# Patient Record
Sex: Female | Born: 1955
Health system: Southern US, Community
[De-identification: ages and names within clinical notes are randomized; demographics above are authoritative.]

## PROBLEM LIST (undated history)

## (undated) DIAGNOSIS — G4730 Sleep apnea, unspecified: Secondary | ICD-10-CM

## (undated) DIAGNOSIS — I10 Essential (primary) hypertension: Secondary | ICD-10-CM

## (undated) DIAGNOSIS — J45909 Unspecified asthma, uncomplicated: Secondary | ICD-10-CM

## (undated) DIAGNOSIS — J449 Chronic obstructive pulmonary disease, unspecified: Secondary | ICD-10-CM

## (undated) DIAGNOSIS — R569 Unspecified convulsions: Secondary | ICD-10-CM

## (undated) DIAGNOSIS — J189 Pneumonia, unspecified organism: Secondary | ICD-10-CM

## (undated) DIAGNOSIS — M719 Bursopathy, unspecified: Secondary | ICD-10-CM

## (undated) DIAGNOSIS — M7072 Other bursitis of hip, left hip: Secondary | ICD-10-CM

## (undated) DIAGNOSIS — K219 Gastro-esophageal reflux disease without esophagitis: Secondary | ICD-10-CM

## (undated) DIAGNOSIS — E785 Hyperlipidemia, unspecified: Secondary | ICD-10-CM

## (undated) DIAGNOSIS — R51 Headache: Secondary | ICD-10-CM

## (undated) HISTORY — DX: Sleep apnea, unspecified: G47.30

## (undated) HISTORY — DX: Unspecified asthma, uncomplicated: J45.909

## (undated) HISTORY — PX: CATARACT EXTRACTION: SUR2

## (undated) HISTORY — DX: Essential (primary) hypertension: I10

## (undated) HISTORY — DX: Bursopathy, unspecified: M71.9

## (undated) HISTORY — DX: Unspecified convulsions: R56.9

## (undated) HISTORY — PX: APPENDECTOMY: SHX54

## (undated) HISTORY — PX: OTHER SURGICAL HISTORY: SHX169

## (undated) HISTORY — PX: CHOLECYSTECTOMY: SHX55

## (undated) HISTORY — PX: ABDOMINAL HYSTERECTOMY: SHX81

---

## 2000-08-07 ENCOUNTER — Other Ambulatory Visit: Admission: RE | Admit: 2000-08-07 | Discharge: 2000-08-07 | Payer: Self-pay | Admitting: Obstetrics and Gynecology

## 2003-12-27 ENCOUNTER — Ambulatory Visit (HOSPITAL_COMMUNITY): Admission: RE | Admit: 2003-12-27 | Discharge: 2003-12-27 | Payer: Self-pay | Admitting: General Surgery

## 2004-01-11 ENCOUNTER — Ambulatory Visit (HOSPITAL_COMMUNITY): Admission: RE | Admit: 2004-01-11 | Discharge: 2004-01-11 | Payer: Self-pay | Admitting: General Surgery

## 2012-06-12 ENCOUNTER — Ambulatory Visit (INDEPENDENT_AMBULATORY_CARE_PROVIDER_SITE_OTHER): Payer: BC Managed Care – PPO | Admitting: Nurse Practitioner

## 2012-06-12 ENCOUNTER — Ambulatory Visit (INDEPENDENT_AMBULATORY_CARE_PROVIDER_SITE_OTHER): Payer: BC Managed Care – PPO

## 2012-06-12 VITALS — BP 143/84 | HR 65 | Temp 98.2°F | Ht 66.0 in | Wt 191.0 lb

## 2012-06-12 DIAGNOSIS — R059 Cough, unspecified: Secondary | ICD-10-CM

## 2012-06-12 DIAGNOSIS — R05 Cough: Secondary | ICD-10-CM

## 2012-06-12 DIAGNOSIS — J309 Allergic rhinitis, unspecified: Secondary | ICD-10-CM

## 2012-06-12 DIAGNOSIS — J329 Chronic sinusitis, unspecified: Secondary | ICD-10-CM

## 2012-06-12 MED ORDER — CETIRIZINE HCL 10 MG PO TABS: 10.0000 mg | ORAL_TABLET | Freq: Every day | ORAL | Status: DC

## 2012-06-12 MED ORDER — HYDROCODONE-HOMATROPINE 5-1.5 MG/5ML PO SYRP: 5.0000 mL | ORAL_SOLUTION | Freq: Three times a day (TID) | ORAL | Status: DC | PRN

## 2012-06-12 MED ORDER — AZITHROMYCIN 250 MG PO TABS: ORAL_TABLET | ORAL | Status: DC

## 2012-06-12 NOTE — Progress Notes (Signed)
  Subjective:    Patient ID: Courtney Mcconnell, female    DOB: 08/18/1955, 57 y.o.   MRN: [id number]  HPI- Patient in C/O cough and fatigue. Trouble sleeping because sh eis coughing so much. Says that she has greenish congestion. Has tried alka-seltzer plus cold and flu OTC.  Review of Systems  Constitutional: Positive for fever (resolved yesterday) and chills.  HENT: Positive for ear pain, congestion, rhinorrhea, postnasal drip and sinus pressure.   Eyes: Negative.   Respiratory: Positive for cough.   Cardiovascular: Negative.   Gastrointestinal: Negative.   Psychiatric/Behavioral: Negative.        Objective:   Physical Exam  Constitutional: She appears well-developed and well-nourished.  HENT:  Right Ear: Hearing, tympanic membrane, external ear and ear canal normal.  Left Ear: Hearing, tympanic membrane, external ear and ear canal normal.  Nose: Mucosal edema and rhinorrhea present. Right sinus exhibits no maxillary sinus tenderness and no frontal sinus tenderness. Left sinus exhibits no maxillary sinus tenderness and no frontal sinus tenderness.  Mouth/Throat: Posterior oropharyngeal erythema present.  Eyes: Conjunctivae are normal. Pupils are equal, round, and reactive to light.  Neck: Normal range of motion. Neck supple.  Cardiovascular: Normal rate, regular rhythm and normal heart sounds.   Pulmonary/Chest: Effort normal and breath sounds normal. She has no wheezes. She has no rales.  Deep dry cough  Abdominal: Soft. Bowel sounds are normal.  Lymphadenopathy:    She has no cervical adenopathy.  Neurological: She is alert.  Skin: Skin is warm.  Psychiatric: She has a normal mood and affect. Her behavior is normal. Judgment and thought content normal.   BP 143/84  Pulse 65  Temp(Src) 98.2 F (36.8 C) (Oral)  Ht 5\' 6"  (1.676 m)  Wt 191 lb (86.637 kg)  BMI 30.84 kg/m2  Chest xray- Bronchitic changes-Preliminary reading by Paulene Floor, FNP  [facility]       Assessment & Plan:   1. Cough - DG Chest 2 View; Future  2. Sinusitis 1. Take meds as prescribed 2. Use a cool mist humidifier especially during the winter months and when heat has  been humid. 3. Use saline nose sprays frequently 4. Saline irrigations of the nose can be very helpful if done frequently.  * 4X daily for 1 week*  * Use of a nettie pot can be helpful with this. Follow directions with this* 5. Drink plenty of fluids 6. Keep thermostat turn down low 7.For any cough or congestion  Use plain Mucinex- regular strength or max strength is fine   * Children- consult with Pharmacist for dosing 8. For fever or aces or pains- take tylenol or ibuprofen appropriate for age and weight.  * for fevers greater than 101 orally you may alternate ibuprofen and tylenol every  3 hours.    - azithromycin (ZITHROMAX Z-PAK) 250 MG tablet; As directed  Dispense: 6 each; Refill: 0 - HYDROcodone-homatropine (HYCODAN) 5-1.5 MG/5ML syrup; Take 5 mLs by mouth every 8 (eight) hours as needed for cough.  Dispense: 120 mL; Refill: 0  3. Allergic rhinitis Avoid allergens - cetirizine (ZYRTEC) 10 MG tablet; Take 1 tablet (10 mg total) by mouth daily.  Dispense: 30 tablet; Refill: 11  Mary-Margaret Daphine Deutscher, FNP

## 2012-06-12 NOTE — Patient Instructions (Signed)

## 2012-07-18 ENCOUNTER — Telehealth: Payer: Self-pay | Admitting: Family Medicine

## 2012-07-18 NOTE — Telephone Encounter (Signed)
appt made for sat am 

## 2012-07-19 ENCOUNTER — Ambulatory Visit (INDEPENDENT_AMBULATORY_CARE_PROVIDER_SITE_OTHER): Payer: BC Managed Care – PPO | Admitting: Family Medicine

## 2012-07-19 VITALS — BP 169/94 | HR 58 | Temp 97.9°F | Wt 189.6 lb

## 2012-07-19 DIAGNOSIS — J309 Allergic rhinitis, unspecified: Secondary | ICD-10-CM | POA: Insufficient documentation

## 2012-07-19 DIAGNOSIS — J209 Acute bronchitis, unspecified: Secondary | ICD-10-CM | POA: Insufficient documentation

## 2012-07-19 DIAGNOSIS — H6690 Otitis media, unspecified, unspecified ear: Secondary | ICD-10-CM | POA: Insufficient documentation

## 2012-07-19 DIAGNOSIS — R062 Wheezing: Secondary | ICD-10-CM

## 2012-07-19 DIAGNOSIS — H6692 Otitis media, unspecified, left ear: Secondary | ICD-10-CM

## 2012-07-19 DIAGNOSIS — F17200 Nicotine dependence, unspecified, uncomplicated: Secondary | ICD-10-CM | POA: Insufficient documentation

## 2012-07-19 MED ORDER — FLUTICASONE PROPIONATE 50 MCG/ACT NA SUSP: 2.0000 | Freq: Every day | NASAL | Status: DC

## 2012-07-19 MED ORDER — SULFAMETHOXAZOLE-TRIMETHOPRIM 800-160 MG PO TABS: 1.0000 | ORAL_TABLET | Freq: Two times a day (BID) | ORAL | Status: DC

## 2012-07-19 MED ORDER — BUDESONIDE-FORMOTEROL FUMARATE 160-4.5 MCG/ACT IN AERO: 2.0000 | INHALATION_SPRAY | Freq: Two times a day (BID) | RESPIRATORY_TRACT | Status: DC

## 2012-07-19 NOTE — Patient Instructions (Addendum)
Smoking Cessation Quitting smoking is important to your health and has many advantages. However, it is not always easy to quit since nicotine is a very addictive drug. Often times, people try 3 times or more before being able to quit. This document explains the best ways for you to prepare to quit smoking. Quitting takes hard work and a lot of effort, but you can do it. ADVANTAGES OF QUITTING SMOKING  You will live longer, feel better, and live better.  Your body will feel the impact of quitting smoking almost immediately.  Within 20 minutes, blood pressure decreases. Your pulse returns to its normal level.  After 8 hours, carbon monoxide levels in the blood return to normal. Your oxygen level increases.  After 24 hours, the chance of having a heart attack starts to decrease. Your breath, hair, and body stop smelling like smoke.  After 48 hours, damaged nerve endings begin to recover. Your sense of taste and smell improve.  After 72 hours, the body is virtually free of nicotine. Your bronchial tubes relax and breathing becomes easier.  After 2 to 12 weeks, lungs can hold more air. Exercise becomes easier and circulation improves.  The risk of having a heart attack, stroke, cancer, or lung disease is greatly reduced.  After 1 year, the risk of coronary heart disease is cut in half.  After 5 years, the risk of stroke falls to the same as a nonsmoker.  After 10 years, the risk of lung cancer is cut in half and the risk of other cancers decreases significantly.  After 15 years, the risk of coronary heart disease drops, usually to the level of a nonsmoker.  If you are pregnant, quitting smoking will improve your chances of having a healthy baby.  The people you live with, especially any children, will be healthier.  You will have extra money to spend on things other than cigarettes. QUESTIONS TO THINK ABOUT BEFORE ATTEMPTING TO QUIT You may want to talk about your answers with your  caregiver.  Why do you want to quit?  If you tried to quit in the past, what helped and what did not?  What will be the most difficult situations for you after you quit? How will you plan to handle them?  Who can help you through the tough times? Your family? Friends? A caregiver?  What pleasures do you get from smoking? What ways can you still get pleasure if you quit? Here are some questions to ask your caregiver:  How can you help me to be successful at quitting?  What medicine do you think would be best for me and how should I take it?  What should I do if I need more help?  What is smoking withdrawal like? How can I get information on withdrawal? GET READY  Set a quit date.  Change your environment by getting rid of all cigarettes, ashtrays, matches, and lighters in your home, car, or work. Do not let people smoke in your home.  Review your past attempts to quit. Think about what worked and what did not. GET SUPPORT AND ENCOURAGEMENT You have a better chance of being successful if you have help. You can get support in many ways.  Tell your family, friends, and co-workers that you are going to quit and need their support. Ask them not to smoke around you.  Get individual, group, or telephone counseling and support. Programs are available at local hospitals and health centers. Call your local health department for   information about programs in your area.  Spiritual beliefs and practices may help some smokers quit.  Download a "quit meter" on your computer to keep track of quit statistics, such as how long you have gone without smoking, cigarettes not smoked, and money saved.  Get a self-help book about quitting smoking and staying off of tobacco. [place] yourself from urges to smoke. Talk to someone, go for a walk, or occupy your time with a task.  Change your normal routine. Take a different route to work. Drink tea instead of coffee.  Eat breakfast in a different place.  Reduce your stress. Take a hot bath, exercise, or read a book.  Plan something enjoyable to do every day. Reward yourself for not smoking.  Explore interactive web-based programs that specialize in helping you quit. GET MEDICINE AND USE IT CORRECTLY Medicines can help you stop smoking and decrease the urge to smoke. Combining medicine with the above behavioral methods and support can greatly increase your chances of successfully quitting smoking.  Nicotine replacement therapy helps deliver nicotine to your body without the negative effects and risks of smoking. Nicotine replacement therapy includes nicotine gum, lozenges, inhalers, nasal sprays, and skin patches. Some may be available over-the-counter and others require a prescription.  Antidepressant medicine helps people abstain from smoking, but how this works is unknown. This medicine is available by prescription.  Nicotinic receptor partial agonist medicine simulates the effect of nicotine in your brain. This medicine is available by prescription. Ask your caregiver for advice about which medicines to use and how to use them based on your health history. Your caregiver will tell you what side effects to look out for if you choose to be on a medicine or therapy. Carefully read the information on the package. Do not use any other product containing nicotine while using a nicotine replacement product.  RELAPSE OR DIFFICULT SITUATIONS Most relapses occur within the first 3 months after quitting. Do not be discouraged if you start smoking again. Remember, most people try several times before finally quitting. You may have symptoms of withdrawal because your body is used to nicotine. You may crave cigarettes, be irritable, feel very hungry, cough often, get headaches, or have difficulty concentrating. The withdrawal symptoms are only temporary. They are strongest when you first quit, but they will go away within  10 14 days. To reduce the chances of relapse, try to:  Avoid drinking alcohol. Drinking lowers your chances of successfully quitting.  Reduce the amount of caffeine you consume. Once you quit smoking, the amount of caffeine in your body increases and can give you symptoms, such as a rapid heartbeat, sweating, and anxiety.  Avoid smokers because they can make you want to smoke.  Do not let weight gain distract you. Many smokers will gain weight when they quit, usually less than 10 pounds. Eat a healthy diet and stay active. You can always lose the weight gained after you quit.  Find ways to improve your mood other than smoking. FOR MORE INFORMATION  www.smokefree.gov  Document Released: 01/23/2001 Document Revised: 07/31/2011 Document Reviewed: 05/10/2011 [facility] Patient Information 2014 [place], [place].   DASH Diet The DASH diet stands for "Dietary Approaches to Stop Hypertension." It is a healthy eating plan that has been shown to reduce high blood pressure (hypertension) in as little as 14 days, while also possibly providing other significant health benefits. These other health benefits include reducing the risk of breast cancer after menopause and  reducing the risk of type 2 diabetes, heart disease, colon cancer, and stroke. Health benefits also include weight loss and slowing kidney failure in patients with chronic kidney disease.  DIET GUIDELINES  Limit salt (sodium). Your diet should contain less than 1500 mg of sodium daily.  Limit refined or processed carbohydrates. Your diet should include mostly whole grains. Desserts and added sugars should be used sparingly.  Include small amounts of heart-healthy fats. These types of fats include nuts, oils, and tub margarine. Limit saturated and trans fats. These fats have been shown to be harmful in the body. CHOOSING FOODS  The following food groups are based on a 2000 calorie diet. See your Registered Dietitian for individual calorie  needs. Grains and Grain Products (6 to 8 servings daily)  Eat More Often: Whole-wheat bread, brown rice, whole-grain or wheat pasta, quinoa, popcorn without added fat or salt (air popped).  Eat Less Often: White bread, white pasta, white rice, cornbread. Vegetables (4 to 5 servings daily)  Eat More Often: Fresh, frozen, and canned vegetables. Vegetables may be raw, steamed, roasted, or grilled with a minimal amount of fat.  Eat Less Often/Avoid: Creamed or fried vegetables. Vegetables in a cheese sauce. Fruit (4 to 5 servings daily)  Eat More Often: All fresh, canned (in natural juice), or frozen fruits. Dried fruits without added sugar. One hundred percent fruit juice ( cup [237 mL] daily).  Eat Less Often: Dried fruits with added sugar. Canned fruit in light or heavy syrup. [employer], Fish, and Poultry (2 servings or less daily. One serving is 3 to 4 oz [85-114 g]).  Eat More Often: Ninety percent or leaner ground beef, tenderloin, sirloin. Round cuts of beef, chicken breast, [place] breast. All fish. Grill, bake, or broil your meat. Nothing should be fried.  Eat Less Often/Avoid: Fatty cuts of meat, [place], or chicken leg, thigh, or wing. Fried cuts of meat or fish. Dairy (2 to 3 servings)  Eat More Often: Low-fat or fat-free milk, low-fat plain or light yogurt, reduced-fat or part-skim cheese.  Eat Less Often/Avoid: Milk (whole, 2%).Whole milk yogurt. Full-fat cheeses. Nuts, Seeds, and Legumes (4 to 5 servings per week)  Eat More Often: All without added salt.  Eat Less Often/Avoid: Salted nuts and seeds, canned beans with added salt. Fats and Sweets (limited)  Eat More Often: Vegetable oils, tub margarines without trans fats, sugar-free gelatin. Mayonnaise and salad dressings.  Eat Less Often/Avoid: Coconut oils, palm oils, butter, stick margarine, cream, half and half, cookies, candy, pie. FOR MORE INFORMATION The Dash Diet Eating Plan: www.dashdiet.org Document  Released: 01/18/2011 Document Revised: 04/23/2011 Document Reviewed: 01/18/2011 [facility] Patient Information 2014 [place], [place].   Check BP every day and record and bring in 4 weeks

## 2012-07-19 NOTE — Progress Notes (Signed)
Patient ID: Courtney Mcconnell, female   DOB: Dec 06, 1955, 57 y.o.   MRN: [id number] SUBJECTIVE: Chief Complaint  Patient presents with  . Acute Visit    seen last month mmm . stated she dx her with allergies .c/o congestion cough and left earache. on decongestants   HPI:  Has nasal congestion, treated recently with Zpak for sinusitis. Now has a left earache. No fever. Coughs a lot. Has allergies. Smokes 1/2 PPD of cigarettes..  Wheezes and sputum.  PMH/PSH: reviewed/updated in Epic  SH/FH: reviewed/updated in Epic  Allergies: reviewed/updated in Epic  Medications: reviewed/updated in Epic  Immunizations: reviewed/updated in Epic  ROS: As above in the HPI. All other systems are stable or negative.  OBJECTIVE: APPEARANCE:  Patient in no acute distress.The patient appeared well nourished and normally developed. Acyanotic. Waist: VITAL SIGNS:BP 169/94  Pulse 58  Temp(Src) 97.9 F (36.6 C) (Oral)  Wt 189 lb 9.6 oz (86.002 kg)  BMI 30.62 kg/m2 WF  SKIN: warm and  Dry without overt rashes, tattoos and scars  HEAD and Neck: without JVD, Head and scalp: normal Eyes:No scleral icterus. Fundi normal, eye movements normal. Ears: Auricle normal, canal normal, Tympanic membranes left side grey with effusion and  Does not insufflatel. Nose:congested with rhinitis Throat: normal Neck & thyroid: normal  CHEST & LUNGS: Chest wall: normal Lungs: wheezes and  Rhonchi scatterred. No rales . Not in distress.  Air movement good.  CVS: Reveals the PMI to be normally located. Regular rhythm, First and Second Heart sounds are normal,  absence of murmurs, rubs or gallops. Peripheral vasculature: Radial pulses: normal Dorsal pedis pulses: normal Posterior pulses: normal  ABDOMEN:  Appearance: normal Benign, no organomegaly, no masses, no Abdominal Aortic enlargement. No Guarding , no rebound. No Bruits. Bowel sounds: normal  RECTAL: N/A GU: N/A  EXTREMETIES: nonedematous. Both  Femoral and Pedal pulses are normal.  MUSCULOSKELETAL:  Spine: normal Joints: intact  NEUROLOGIC: oriented to time,place and person; nonfocal. Strength is normal Sensory is normal Reflexes are normal Cranial Nerves are normal.  ASSESSMENT: Otitis media, left - Plan: sulfamethoxazole-trimethoprim (BACTRIM DS,SEPTRA DS) 800-160 MG per tablet  Allergic rhinitis - Plan: fluticasone (FLONASE) 50 MCG/ACT nasal spray  Tobacco use disorder  Acute bronchitis - Plan: sulfamethoxazole-trimethoprim (BACTRIM DS,SEPTRA DS) 800-160 MG per tablet  Wheezing - Plan: budesonide-formoterol (SYMBICORT) 160-4.5 MCG/ACT inhaler   PLAN: No orders of the defined types were placed in this encounter.   No results found for this or any previous visit. Meds ordered this encounter  Medications  . sulfamethoxazole-trimethoprim (BACTRIM DS,SEPTRA DS) 800-160 MG per tablet    Sig: Take 1 tablet by mouth 2 (two) times daily.    Dispense:  20 tablet    Refill:  0  . fluticasone (FLONASE) 50 MCG/ACT nasal spray    Sig: Place 2 sprays into the nose daily.    Dispense:  16 g    Refill:  6  . budesonide-formoterol (SYMBICORT) 160-4.5 MCG/ACT inhaler    Sig: Inhale 2 puffs into the lungs 2 (two) times daily.    Dispense:  1 Inhaler    Refill:  3   Return in about 4 weeks (around 08/16/2012) for Recheck medical problems. Handout in AVS on smoking cessation. DASH diet and  Recording BP daily for 4 weeks.  Elba Schaber P. [place]esto Charon, M.D.

## 2012-07-30 ENCOUNTER — Other Ambulatory Visit: Payer: Self-pay | Admitting: Family Medicine

## 2012-08-06 ENCOUNTER — Encounter (HOSPITAL_COMMUNITY): Payer: Self-pay | Admitting: Emergency Medicine

## 2012-08-06 ENCOUNTER — Emergency Department (HOSPITAL_COMMUNITY)
Admission: EM | Admit: 2012-08-06 | Discharge: 2012-08-06 | Disposition: A | Discharge status: Home / Self Care | Payer: BC Managed Care – PPO | Attending: Emergency Medicine | Admitting: Emergency Medicine

## 2012-08-06 ENCOUNTER — Emergency Department (HOSPITAL_COMMUNITY): Payer: BC Managed Care – PPO

## 2012-08-06 DIAGNOSIS — Z79899 Other long term (current) drug therapy: Secondary | ICD-10-CM | POA: Insufficient documentation

## 2012-08-06 DIAGNOSIS — IMO0002 Reserved for concepts with insufficient information to code with codable children: Secondary | ICD-10-CM | POA: Insufficient documentation

## 2012-08-06 DIAGNOSIS — I10 Essential (primary) hypertension: Secondary | ICD-10-CM | POA: Insufficient documentation

## 2012-08-06 DIAGNOSIS — F17200 Nicotine dependence, unspecified, uncomplicated: Secondary | ICD-10-CM | POA: Insufficient documentation

## 2012-08-06 DIAGNOSIS — R569 Unspecified convulsions: Secondary | ICD-10-CM | POA: Insufficient documentation

## 2012-08-06 HISTORY — DX: Essential (primary) hypertension: I10

## 2012-08-06 LAB — COMPREHENSIVE METABOLIC PANEL
ALT: 7 U/L (ref 0–35)
AST: 15 U/L (ref 0–37)
Albumin: 3.6 g/dL (ref 3.5–5.2)
Alkaline Phosphatase: 90 U/L (ref 39–117)
BUN: 10 mg/dL (ref 6–23)
CO2: 29 mEq/L (ref 19–32)
Calcium: 9.3 mg/dL (ref 8.4–10.5)
Chloride: 100 mEq/L (ref 96–112)
Creatinine, Ser: 0.78 mg/dL (ref 0.50–1.10)
GFR calc Af Amer: >90 mL/min (ref >90)
GFR calc non Af Amer: >90 mL/min (ref >90)
Glucose, Bld: 100 mg/dL — ABNORMAL HIGH (ref 70–99)
Potassium: 4 mEq/L (ref 3.5–5.1)
Sodium: 136 mEq/L (ref 135–145)
Total Bilirubin: 0.3 mg/dL (ref 0.3–1.2)
Total Protein: 7 g/dL (ref 6.0–8.3)

## 2012-08-06 LAB — CBC WITH DIFFERENTIAL/PLATELET
Basophils Absolute: 0 10*3/uL (ref 0.0–0.1)
Basophils Relative: 0 % (ref 0–1)
Eosinophils Absolute: 0.1 10*3/uL (ref 0.0–0.7)
Eosinophils Relative: 1 % (ref 0–5)
HCT: 39.1 % (ref 36.0–46.0)
Hemoglobin: 13.2 g/dL (ref 12.0–15.0)
Lymphocytes Relative: 17 % (ref 12–46)
Lymphs Abs: 1.1 10*3/uL (ref 0.7–4.0)
MCH: 32.7 pg (ref 26.0–34.0)
MCHC: 33.8 g/dL (ref 30.0–36.0)
MCV: 96.8 fL (ref 78.0–100.0)
Monocytes Absolute: 0.6 10*3/uL (ref 0.1–1.0)
Monocytes Relative: 9 % (ref 3–12)
Neutro Abs: 4.8 10*3/uL (ref 1.7–7.7)
Neutrophils Relative %: 73 % (ref 43–77)
Platelets: 209 10*3/uL (ref 150–400)
RBC: 4.04 MIL/uL (ref 3.87–5.11)
RDW: 12.8 % (ref 11.5–15.5)
WBC: 6.6 10*3/uL (ref 4.0–10.5)

## 2012-08-06 LAB — URINALYSIS, ROUTINE W REFLEX MICROSCOPIC
Bilirubin Urine: NEGATIVE
Glucose, UA: NEGATIVE mg/dL
Hgb urine dipstick: NEGATIVE
Ketones, ur: NEGATIVE mg/dL
Leukocytes, UA: NEGATIVE
Nitrite: NEGATIVE
Protein, ur: NEGATIVE mg/dL
Specific Gravity, Urine: <1.005 — ABNORMAL LOW (ref 1.005–1.030)
Urobilinogen, UA: 0.2 mg/dL (ref 0.0–1.0)
pH: 6 (ref 5.0–8.0)

## 2012-08-06 LAB — GLUCOSE, CAPILLARY: Glucose-Capillary: 95 mg/dL (ref 70–99)

## 2012-08-06 MED ORDER — LEVETIRACETAM 500 MG PO TABS: 250.0000 mg | ORAL_TABLET | Freq: Two times a day (BID) | ORAL | Status: DC

## 2012-08-06 MED ORDER — LEVETIRACETAM 250 MG PO TABS
250.0000 mg | ORAL_TABLET | Freq: Once | ORAL | Status: AC
  Administered 2012-08-06: 250 mg via ORAL
  Filled 2012-08-06: qty 1

## 2012-08-06 MED ORDER — SODIUM CHLORIDE 0.9 % IV BOLUS (SEPSIS)
1000.0000 mL | Freq: Once | INTRAVENOUS | Status: AC
  Administered 2012-08-06: 1000 mL via INTRAVENOUS

## 2012-08-06 MED ORDER — IPRATROPIUM BROMIDE 0.02 % IN SOLN: 0.5000 mg | Freq: Once | RESPIRATORY_TRACT | Status: DC

## 2012-08-06 MED ORDER — ALBUTEROL SULFATE (5 MG/ML) 0.5% IN NEBU: 5.0000 mg | INHALATION_SOLUTION | Freq: Once | RESPIRATORY_TRACT | Status: DC

## 2012-08-06 MED ORDER — ACETAMINOPHEN 500 MG PO TABS
1000.0000 mg | ORAL_TABLET | Freq: Once | ORAL | Status: AC
  Administered 2012-08-06: 1000 mg via ORAL
  Filled 2012-08-06: qty 2

## 2012-08-06 NOTE — ED Notes (Addendum)
Per family patient had witnessed seizure this morning at 5. Patient was checked by EMS and at that time patient refused to go. Family reports patient postictal after seizure. Husband stated that she went to bathroom and came into kitchen sit down across from him. Husband stated that her "eyes didn't look right, her body started shaking and she fall out of chair hitting head and back on wall. Large contusion noted to mid/lower back. Husband stated that she had an episode like this 3 months ago but did not come to hospital to be checked. Patient denies hx of seizures. Patient c/o headache.

## 2012-08-06 NOTE — ED Notes (Signed)
Patient intially denied any other pain then in head, now c/o neck and back pain.

## 2012-08-06 NOTE — ED Provider Notes (Signed)
History  This chart was scribed for Donnetta Hutching, MD by Bennett Scrape, ED Scribe. This patient was seen in room APA05/APA05 and the patient's care was started at 8:01 AM.  CSN: [id number] Arrival date & time 08/06/12  0729   First MD Initiated Contact with Patient 08/06/12 0801     Chief Complaint  Patient presents with  . Seizures    The history is provided by the patient. No language interpreter was used.   HPI Comments: Courtney Mcconnell is a 57 y.o. female who presents to the Emergency Department complaining of one possible seizure that occurred around 5:30 AM this morning. Husband states that the pt got up and went to sit at the kitchen table. Her eyes appeared to be staring off and she was non-responsive. Pt then began to have rhythmic jerking mostly in the arms and afterwards gad post ictal behavior. The entire episode lasted 15 minutes and pt states that she feels at baseline. Per husband, pt had a similar episode 2 to 3 months ago. Pt doesn't remember this episode and wasn't evaluated at the time. Pt denies tongue biting and urinary or bowel incontinence as associated symptoms. She currently takes ASA and GERD medication. Pt is a current everyday smoker but denies alcohol use.  Past Medical History  Diagnosis Date  . Hypertension    Past Surgical History  Procedure Laterality Date  . Abdominal hysterectomy    . Cholecystectomy    . Appendectomy    . Cataract extraction Bilateral    Family History  Problem Relation Age of Onset  . Diabetes Father   . Hypertension Father   . Hyperlipidemia Father    History  Substance Use Topics  . Smoking status: Current Every Day Smoker -- 1.00 packs/day for 42 years    Types: Cigarettes  . Smokeless tobacco: Never Used  . 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   Allergies  Review of patient's allergies indicates no known allergies.  Home Medications   Current Outpatient Rx  Name  Route  Sig  Dispense  Refill  . budesonide-formoterol (SYMBICORT) 160-4.5 MCG/ACT inhaler   Inhalation   Inhale 2 puffs into the lungs 2 (two) times daily.   1 Inhaler   3   . cetirizine (ZYRTEC) 10 MG tablet   Oral   Take 1 tablet (10 mg total) by mouth daily.   30 tablet   11   . fluticasone (FLONASE) 50 MCG/ACT nasal spray   Nasal   Place 2 sprays into the nose daily.   16 g   6   . sulfamethoxazole-trimethoprim (BACTRIM DS,SEPTRA DS) 800-160 MG per tablet   Oral   Take 1 tablet by mouth 2 (two) times daily.   20 tablet   0    Triage Vitals: BP 167/84  Pulse 62  Temp(Src) 98.4 F (36.9 C) (Oral)  Ht 5\' 6"  (1.676 m)  Wt 189 lb (85.73 kg)  BMI 30.52 kg/m2  SpO2 97%  Physical Exam  Nursing note and vitals reviewed. Constitutional: She is oriented to person, place, and time. She appears well-developed and well-nourished.  HENT:  Head:  Normocephalic and atraumatic.  Eyes: Conjunctivae and EOM are normal. Pupils are equal, round, and reactive to light.  Neck: Normal range of motion. Neck supple.  Cardiovascular: Normal rate, regular rhythm and normal heart sounds.   Pulmonary/Chest: Effort normal and breath sounds normal.  Abdominal: Soft. Bowel sounds are normal.  Musculoskeletal: Normal range of motion.  Full ROM of all extremities   Neurological: She is alert and oriented to person, place, and time.  No seizure like activity noted   Skin: Skin is warm and dry.  Psychiatric: She has a normal mood and affect.    ED Course  Procedures (including critical care time)  Medications  sodium chloride 0.9 % bolus 1,000 mL (1,000 mLs Intravenous New Bag/Given 08/06/12 0850)    DIAGNOSTIC STUDIES: Oxygen Saturation is 97% on room air, normal by my interpretation.     COORDINATION OF CARE: 8:08 AM-Discussed treatment plan which includes CT of head, CBC, CMP and UA with pt at bedside and pt agreed to plan. Advised pt that she ill have to f/u with a neurologist and strongly advised pt to not drive.  Labs Reviewed  COMPREHENSIVE METABOLIC PANEL - Abnormal; Notable for the following:    Glucose, Bld 100 (*)    All other components within normal limits  URINALYSIS, ROUTINE W REFLEX MICROSCOPIC - Abnormal; Notable for the following:    Specific Gravity, Urine <1.005 (*)    All other components within normal limits  CBC WITH DIFFERENTIAL  GLUCOSE, CAPILLARY   Ct Head Wo Contrast  08/06/2012   *RADIOLOGY REPORT*  Clinical Data: Seizure activity.  Altered mental status.  CT HEAD WITHOUT CONTRAST  Technique:  Contiguous axial images were obtained from the base of the skull through the vertex without contrast.  Comparison: None.  Findings: No evidence of an acute infarct, acute hemorrhage, mass lesion, mass effect or hydrocephalus.  Visualized portions of the paranasal sinuses and mastoid air cells are clear.  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  IMPRESSION: No acute findings.   Original Report Authenticated By: Leanna Battles, M.D.   No diagnosis found.    Date: 08/06/2012  Rate: 60 Rhythm: normal sinus rhythm  QRS Axis: normal  Intervals: normal  ST/T Wave abnormalities: normal  Conduction Disutrbances: Incomplete right bundle branch block  Narrative Interpretation: unremarkable    MDM  History suggestive of grand mal seizure. Neuro exam the emergency Department normal. CT head, labs, EKG all normal. Discussed with neurologist. He recommended starting Keppra 250 mg twice a day. Follow up with neurology. No driving  I personally performed the services described in this documentation, which was scribed in my presence. The recorded information has been reviewed and is accurate.    Donnetta Hutching, MD 08/06/12 1152

## 2012-08-06 NOTE — ED Notes (Signed)
Pt and family reports seizure like activity that started around 5am today. Pt alert and able to answer all ?'s at arrival. Reports slight headache. And and has large bruise to lower back area. Multiple family members at bedside.

## 2012-08-06 NOTE — ED Notes (Addendum)
Pt given coke to drink, dr. Adriana Simas to see pt, discussed plan of care, family at bedside. Pt has not had any seizure activity at present.

## 2012-08-06 NOTE — ED Notes (Signed)
Pt walked to bathroom assisted by nt.  Pt tolerated well, specimen sent to lab.

## 2012-08-11 ENCOUNTER — Telehealth: Payer: Self-pay | Admitting: Family Medicine

## 2012-08-11 NOTE — Telephone Encounter (Signed)
Pt notified NTBS here first Appt scheduled

## 2012-08-13 ENCOUNTER — Encounter: Payer: Self-pay | Admitting: Family Medicine

## 2012-08-13 ENCOUNTER — Ambulatory Visit (INDEPENDENT_AMBULATORY_CARE_PROVIDER_SITE_OTHER): Payer: BC Managed Care – PPO | Admitting: Family Medicine

## 2012-08-13 VITALS — BP 125/77 | HR 64 | Temp 97.2°F | Wt 195.2 lb

## 2012-08-13 DIAGNOSIS — R569 Unspecified convulsions: Secondary | ICD-10-CM

## 2012-08-13 NOTE — Progress Notes (Signed)
Pt requesting to go to Dr Daphane Shepherd in [facility] for his neurologist .

## 2012-08-13 NOTE — Progress Notes (Signed)
  Subjective:    Patient ID: Courtney Mcconnell, female    DOB: 03/06/1955, 57 y.o.   MRN: [id number]  HPI This 57 y.o. female presents for evaluation of seizures. She had first sz over 3 months ago in the am. Her husband states they were both asleep and then 0500 in the am she was unresponsive and her husband witnessed her jerking.  She did not go to the hospital and she states the post prandial period wore off quick and she didn't go.  Her second sz happened on 08/06/12 again in bed in the am and she was witnessed by her husband having jerking movements and she states the last thing she remembered the paramedics at the house and she was taken to the ED via POV.  Her CT scan was negative.  She was placed on keppra 250mg  bid and has not had sz since.   Review of Systems No chest pain, SOB, HA, dizziness, vision change, N/V, diarrhea, constipation, dysuria, urinary urgency or frequency, myalgias, arthralgias or rash.     Objective:   Physical Exam Vital signs noted  Well developed well nourished female.  HEENT - Head atraumatic Normocephalic                Eyes - PERRLA, Conjuctiva - clear Sclera- Clear EOMI                Ears - EAC's Wnl TM's Wnl Gross Hearing WNL                Nose - Nares patent                 Throat - oropharanx wnl Respiratory - Lungs CTA bilateral Cardiac - RRR S1 and S2 without murmur GI - Abdomen soft Nontender and bowel sounds active x 4 Extremities - No edema. Neuro - Grossly intact.       Assessment & Plan:  Convulsions/seizures New onset SZ's refer to Neurology.  Dr. Daphane Shepherd in [place].  Continue current dose of keppra and if any further sz activity go to ED or follow up or return to clinic.

## 2012-08-13 NOTE — Patient Instructions (Signed)
Seizures You had a seizure. About 2% of the population will have a seizure problem during their lifetime. Sometimes the cause for the seizure is not known. Seizures are usually associated with one of these problems:  Epilepsy.   Not taking your seizure medicine.   Alcohol and drug abuse.   Head injury, strokes, tumors, and brain surgery.   High fever and infections.   Low blood sugar.  Evaluating a new seizure disorder may require having a brain scan or a brain wave test called an EEG. If you have been given a seizure medicine, it is very important that you take it as prescribed. Not taking these medicines as directed is the most common cause of seizures. Blood tests are often used to be sure you are taking the proper dose.  Seizures cause many different symptoms, from convulsions to brief blackouts. Do not ride a bike, drive a car, go swimming, climb in high or dangerous places such as ladders or roofs, or operate any dangerous equipment until you have your doctor's permission. If you hold a driver's license, state law may require that a report be made to the motor vehicles department. You should wear an emergency medical identification bracelet with information about your seizures. If you have any warning that a seizure may occur, lie down in a safe place to protect yourself. Teach your family and friends what to do if you have any further seizures. They should stay calm and try to keep you from falling on hard or sharp objects. It is best not to try to restrain a seizing person or to force anything into his or her mouth. Do not try to open clenched jaws. When the seizure is over, the person should be rolled on their side to help drain any vomit or secretions from the mouth. After a seizure, a person may be confused or drowsy for several minutes. An ambulance should be called if the seizure lasted more than 5 minutes or if confusion remains for more than 30 minutes. Call your caregiver or the  emergency department for further instructions. Do not drive until cleared by your caregiver or neurologist! Document Released: 03/08/2004 Document Revised: 10/11/2010 Document Reviewed: 01/29/2005 [facility] Patient Information 2012 [place], [place].

## 2012-08-14 ENCOUNTER — Ambulatory Visit: Payer: BC Managed Care – PPO | Admitting: Family Medicine

## 2012-09-03 ENCOUNTER — Encounter: Payer: Self-pay | Admitting: Diagnostic Neuroimaging

## 2012-09-03 ENCOUNTER — Ambulatory Visit (INDEPENDENT_AMBULATORY_CARE_PROVIDER_SITE_OTHER): Payer: BC Managed Care – PPO | Admitting: Diagnostic Neuroimaging

## 2012-09-03 VITALS — BP 156/75 | HR 59 | Temp 98.1°F | Ht 66.0 in | Wt 194.0 lb

## 2012-09-03 DIAGNOSIS — R569 Unspecified convulsions: Secondary | ICD-10-CM

## 2012-09-03 MED ORDER — LEVETIRACETAM ER 500 MG PO TB24: 500.0000 mg | ORAL_TABLET | Freq: Every day | ORAL | Status: DC

## 2012-09-03 NOTE — Patient Instructions (Addendum)
Change medication to Keppra Extended Release 500 mg, take 1 daily.  MRI brain withoput contrast.  EEG to evaluate for seizres.  No driving until 6 months after last seizre, [place] Law.  Follow up in 3 months.

## 2012-09-03 NOTE — Progress Notes (Signed)
[facility]  PATIENT: Courtney Mcconnell DOB: August 08, 1955   REFERRING PHYSICIAN: Ernestina Penna HISTORY FROM: Patient, family members REASON FOR VISIT: New Consult   HISTORICAL  CHIEF COMPLAINT:  Chief Complaint  Patient presents with  . Seizures    HISTORY OF PRESENT ILLNESS: 57 year old female here for seizure disorder evaluation.   Patient is accompanied by 3 family members. She has no recollection of the 2 episodes.  Family member explains that first episode occurred in March 2014, she awoke at 5:00am, convulsions started in bed, husband could not wake her up. Convulsions lasted about 30 minutes, husband put wet rag on her face and when it stopped and she started to wake up. There was no tongue-biting or incontinence of bowel or bladder.  They did not seek medical care.  Another event happened in the morning on 08/06/12, family reports that patient got up as usual in the morning, went to the bathroom, went to he kitchen, got a cigarette and sat at the kitchen table.  Then she developed staring and then convulsions, lasting about 15 minutes, afterward she was taken to [facility] ED by family.  She was started on LEV 250 mg BID at ED discharge, CT head was negative.  Patient denies any history of stroke or family history of seizure. Patient reports she is only taking LEV 250 mg at night because taking it twice a day made her feel like "she wasn't in the world".  Has had occasional olfactory hallucinations of vinegar, and has had some deja vu episodes.  Current every day smoker.   REVIEW OF SYSTEMS: Full 14 system review of systems performed and notable only for fatigue easy bruising feeling had increased her sharp and aching muscle seizure tremor numbness headache memory loss.  ALLERGIES: Allergies  Allergen Reactions  . Bee Venom Swelling    HOME MEDICATIONS: Outpatient Prescriptions Prior to Visit  Medication Sig Dispense Refill  . budesonide-formoterol  (SYMBICORT) 160-4.5 MCG/ACT inhaler Inhale 2 puffs into the lungs 2 (two) times daily.  1 Inhaler  3  . omeprazole (PRILOSEC OTC) 20 MG tablet Take 20 mg by mouth every evening.      Marland Kitchen Phenyleph-Doxylamine-DM-APAP (ALKA-SELTZER PLS ALLERGY & CGH PO) Take 2 tablets by mouth at bedtime as needed (cold and cough symptoms).      Marland Kitchen sulfamethoxazole-trimethoprim (BACTRIM DS,SEPTRA DS) 800-160 MG per tablet Take 1 tablet by mouth 2 (two) times daily.  20 tablet  0  . levETIRAcetam (KEPPRA) 500 MG tablet Take 0.5 tablets (250 mg total) by mouth 2 (two) times daily.  60 tablet  0   No facility-administered medications prior to visit.    PAST MEDICAL HISTORY: Past Medical History  Diagnosis Date  . Hypertension     PAST SURGICAL HISTORY: Past Surgical History  Procedure Laterality Date  . Abdominal hysterectomy    . Cholecystectomy    . Appendectomy    . Cataract extraction Bilateral     FAMILY HISTORY: Family History  Problem Relation Age of Onset  . Diabetes Father   . Hypertension Father   . Hyperlipidemia Father     SOCIAL HISTORY: History   Social History  . Marital Status: Married    Spouse Name: N/A    Number of Children: N/A  . Years of Education: N/A   Occupational History  . Not on file.   Social History Main Topics  . Smoking status: Current Every Day Smoker -- 1.00 packs/day for 42 years  Types: Cigarettes  . Smokeless tobacco: Never Used  . Alcohol Use: No  . Drug Use: No  . Sexually Active: Not on file   Other Topics Concern  . Not on file   Social History Narrative  . No narrative on file    PHYSICAL EXAM  Filed Vitals:   09/03/12 0848  BP: 156/75  Pulse: 59  Temp: 98.1 F (36.7 C)  TempSrc: Oral  Height: 5\' 6"  (1.676 m)  Weight: 194 lb (87.998 kg)   Body mass index is 31.33 kg/(m^2).  Generalized: In no acute distress, Caucasian female, looks 10 years older than stated age STRONG ODOR OF CIGARETTES IN EXAM ROOM.  Neck: Supple, no  carotid bruits   Cardiac: Regular rate rhythm, no murmur   Pulmonary: Clear to auscultation bilaterally   Musculoskeletal: No deformity   Neurological examination   Mentation: Alert oriented to time, place, history taking, language fluent, and causual conversation  Cranial nerve II-XII: Pupils were equal round reactive to light extraocular movements were full, visual field were full on confrontational test. facial sensation and strength were normal. hearing was intact to finger rubbing bilaterally. Uvula tongue midline. head turning and shoulder shrug and were normal and symmetric.Tongue protrusion into cheek strength was normal. MOTOR: normal bulk and tone, full strength in the BUE, BLE, fine finger movements normal, no pronator drift SENSORY: normal and symmetric to light touch, pinprick, temperature, vibration and proprioception COORDINATION: finger-nose-finger, heel-to-shin bilaterally, there was no truncal ataxia REFLEXES: Brachioradialis 2/2, biceps 2/2, triceps 2/2, patellar 2/2, Achilles 2/2, plantar responses were flexor bilaterally. GAIT/STATION: Rising up from seated position without assistance, normal stance, without trunk ataxia, moderate stride, good arm swing, smooth turning, able to perform tiptoe, and heel walking without difficulty.   DIAGNOSTIC DATA (LABS, IMAGING, TESTING) - I reviewed patient records, labs, notes, testing and imaging myself where available.  Lab Results  Component Value Date   WBC 6.6 08/06/2012   HGB 13.2 08/06/2012   HCT 39.1 08/06/2012   MCV 96.8 08/06/2012   PLT 209 08/06/2012      Component Value Date/Time   NA 136 08/06/2012 0753   K 4.0 08/06/2012 0753   CL 100 08/06/2012 0753   CO2 29 08/06/2012 0753   GLUCOSE 100* 08/06/2012 0753   BUN 10 08/06/2012 0753   CREATININE 0.78 08/06/2012 0753   CALCIUM 9.3 08/06/2012 0753   PROT 7.0 08/06/2012 0753   ALBUMIN 3.6 08/06/2012 0753   AST 15 08/06/2012 0753   ALT 7 08/06/2012 0753   ALKPHOS 90 08/06/2012  0753   BILITOT 0.3 08/06/2012 0753   GFRNONAA >90 08/06/2012 0753   GFRAA >90 08/06/2012 0753    08/06/12 CT head - no acute findings   ASSESSMENT AND PLAN  57 y.o. year old female  has a past medical history of Hypertension here with 2 recent episodes of witnessed convulsions, 2-3 months apart.  Dx: seizure disorder  PLAN: 1. MRI brain 2. EEG 3. Change levetiracetam 250mg  daily to levetiracetam XR 500 mg qhs to try to improve compliance. 4. No driving until seizure free x 6 months 5. Follow up in 3 months.   Orders Placed This Encounter  Procedures  . MR Brain Wo Contrast  . EEG    Meds ordered this encounter  Medications  . levETIRAcetam (KEPPRA XR) 500 MG 24 hr tablet    Sig: Take 1 tablet (500 mg total) by mouth daily.    Dispense:  30 tablet    Refill:  5  Order Specific Question:  Supervising Provider    Answer:  Suanne Marker [[id number]]    Suanne Marker, MD and LYNN LAM NP-C 09/03/2012, 10:17 AM  [facility] [address], [address] [place], [place] [address] [phone]

## 2012-09-10 ENCOUNTER — Ambulatory Visit (INDEPENDENT_AMBULATORY_CARE_PROVIDER_SITE_OTHER): Payer: BC Managed Care – PPO

## 2012-09-10 DIAGNOSIS — R569 Unspecified convulsions: Secondary | ICD-10-CM

## 2012-09-11 DIAGNOSIS — Z0289 Encounter for other administrative examinations: Secondary | ICD-10-CM

## 2012-09-17 ENCOUNTER — Telehealth: Payer: Self-pay | Admitting: Diagnostic Neuroimaging

## 2012-09-17 ENCOUNTER — Ambulatory Visit (INDEPENDENT_AMBULATORY_CARE_PROVIDER_SITE_OTHER): Payer: BC Managed Care – PPO

## 2012-09-17 DIAGNOSIS — R569 Unspecified convulsions: Secondary | ICD-10-CM

## 2012-09-18 ENCOUNTER — Telehealth: Payer: Self-pay

## 2012-09-18 NOTE — Procedures (Signed)
   [facility]  EEG (ELECTROENCEPHALOGRAM) REPORT   STUDY DATE: 09/10/12 PATIENT NAME: Courtney Mcconnell DOB: [birth year] MRN: [id number]  ORDERING CLINICIAN: Joycelyn Schmid, MD   TECHNOLOGIST: Gearldine Shown TECHNIQUE: Electroencephalogram was recorded utilizing standard 10-20 system of lead placement and reformatted into average and bipolar montages.  RECORDING TIME: 29 minutes ACTIVATION: hyperventilation and photic stimulation  CLINICAL INFORMATION: 57 year old female with new onset seizure.   FINDINGS: Background rhythms of 9-10 hertz and 30-40 microvolts. No focal, lateralizing, epileptiform activity or seizures are seen. Patient recorded in the awake state. EKG channel shows regular rhythm 54 beats per minute.  IMPRESSION:  Normal EEG in the awake state. Mild bradycardia noted (54 beats per minute).   INTERPRETING PHYSICIAN:  Suanne Marker, MD Certified in Neurology, Neurophysiology and Neuroimaging  [facility] [address] [place], [place] [address] [phone]

## 2012-09-18 NOTE — Telephone Encounter (Signed)
Pt given results of EEG by Vikki Ports, RN.  Pt verbalized understanding.

## 2012-09-18 NOTE — Telephone Encounter (Signed)
Patient called. I reviewed the results of EEG. Patient and I reviewed FMLA forms, as this is the first time she had had to have them completed. Patient will pick up forms on Monday. She will need to complete her part before submitting.

## 2012-09-18 NOTE — Telephone Encounter (Signed)
I called home and pt will be home at 1430 re: results of EEG.

## 2012-09-29 ENCOUNTER — Telehealth: Payer: Self-pay | Admitting: Diagnostic Neuroimaging

## 2012-10-01 NOTE — Telephone Encounter (Signed)
Mother called requesting MRI results. Call and gave MRI impression, confirmed upcoming OV on 12/11/12.

## 2012-10-04 ENCOUNTER — Ambulatory Visit (INDEPENDENT_AMBULATORY_CARE_PROVIDER_SITE_OTHER): Payer: BC Managed Care – PPO | Admitting: Family Medicine

## 2012-10-04 VITALS — BP 134/84 | HR 60 | Temp 97.5°F | Resp 20 | Ht 66.0 in | Wt 190.0 lb

## 2012-10-04 DIAGNOSIS — F411 Generalized anxiety disorder: Secondary | ICD-10-CM

## 2012-10-04 DIAGNOSIS — J209 Acute bronchitis, unspecified: Secondary | ICD-10-CM

## 2012-10-04 MED ORDER — ALPRAZOLAM 0.5 MG PO TABS: 0.5000 mg | ORAL_TABLET | Freq: Every day | ORAL | Status: DC

## 2012-10-04 MED ORDER — AZITHROMYCIN 250 MG PO TABS: ORAL_TABLET | ORAL | Status: DC

## 2012-10-04 MED ORDER — BENZONATATE 100 MG PO CAPS: 100.0000 mg | ORAL_CAPSULE | Freq: Three times a day (TID) | ORAL | Status: DC | PRN

## 2012-10-04 MED ORDER — METHYLPREDNISOLONE (PAK) 4 MG PO TABS: ORAL_TABLET | ORAL | Status: DC

## 2012-10-04 NOTE — Progress Notes (Signed)
  Subjective:    Patient ID: Courtney Mcconnell, female    DOB: 07-Jul-1955, 57 y.o.   MRN: [id number]  HPI This 57 y.o. female presents for evaluation of uri sx's for over a week.   Review of Systems No chest pain, SOB, HA, dizziness, vision change, N/V, diarrhea, constipation, dysuria, urinary urgency or frequency, myalgias, arthralgias or rash.     Objective:   Physical Exam Vital signs noted  Well developed well nourished female.  HEENT - Head atraumatic Normocephalic                Eyes - PERRLA, Conjuctiva - clear Sclera- Clear EOMI                Ears - EAC's Wnl TM's Wnl Gross Hearing WNL                Nose - Nares patent                 Throat - oropharanx wnl Respiratory - Lungs CTA bilateral Cardiac - RRR S1 and S2 without murmur GI - Abdomen soft Nontender and bowel sounds active x 4 Extremities - No edema. Neuro - Grossly intact.       Assessment & Plan:  Acute bronchitis - Plan: azithromycin (ZITHROMAX) 250 MG tablet, methylPREDNIsolone (MEDROL DOSPACK) 4 MG tablet, benzonatate (TESSALON PERLES) 100 MG capsule  Anxiety state, unspecified - Plan: ALPRAZolam (XANAX) 0.5 MG tablet

## 2012-10-04 NOTE — Patient Instructions (Addendum)
Smoking Cessation Quitting smoking is important to your health and has many advantages. However, it is not always easy to quit since nicotine is a very addictive drug. Often times, people try 3 times or more before being able to quit. This document explains the best ways for you to prepare to quit smoking. Quitting takes hard work and a lot of effort, but you can do it. ADVANTAGES OF QUITTING SMOKING  You will live longer, feel better, and live better.  Your body will feel the impact of quitting smoking almost immediately.  Within 20 minutes, blood pressure decreases. Your pulse returns to its normal level.  After 8 hours, carbon monoxide levels in the blood return to normal. Your oxygen level increases.  After 24 hours, the chance of having a heart attack starts to decrease. Your breath, hair, and body stop smelling like smoke.  After 48 hours, damaged nerve endings begin to recover. Your sense of taste and smell improve.  After 72 hours, the body is virtually free of nicotine. Your bronchial tubes relax and breathing becomes easier.  After 2 to 12 weeks, lungs can hold more air. Exercise becomes easier and circulation improves.  The risk of having a heart attack, stroke, cancer, or lung disease is greatly reduced.  After 1 year, the risk of coronary heart disease is cut in half.  After 5 years, the risk of stroke falls to the same as a nonsmoker.  After 10 years, the risk of lung cancer is cut in half and the risk of other cancers decreases significantly.  After 15 years, the risk of coronary heart disease drops, usually to the level of a nonsmoker.  If you are pregnant, quitting smoking will improve your chances of having a healthy baby.  The people you live with, especially any children, will be healthier.  You will have extra money to spend on things other than cigarettes. QUESTIONS TO THINK ABOUT BEFORE ATTEMPTING TO QUIT You may want to talk about your answers with your  caregiver.  Why do you want to quit?  If you tried to quit in the past, what helped and what did not?  What will be the most difficult situations for you after you quit? How will you plan to handle them?  Who can help you through the tough times? Your family? Friends? A caregiver?  What pleasures do you get from smoking? What ways can you still get pleasure if you quit? Here are some questions to ask your caregiver:  How can you help me to be successful at quitting?  What medicine do you think would be best for me and how should I take it?  What should I do if I need more help?  What is smoking withdrawal like? How can I get information on withdrawal? GET READY  Set a quit date.  Change your environment by getting rid of all cigarettes, ashtrays, matches, and lighters in your home, car, or work. Do not let people smoke in your home.  Review your past attempts to quit. Think about what worked and what did not. GET SUPPORT AND ENCOURAGEMENT You have a better chance of being successful if you have help. You can get support in many ways.  Tell your family, friends, and co-workers that you are going to quit and need their support. Ask them not to smoke around you.  Get individual, group, or telephone counseling and support. Programs are available at local hospitals and health centers. Call your local health department for   information about programs in your area.  Spiritual beliefs and practices may help some smokers quit.  Download a "quit meter" on your computer to keep track of quit statistics, such as how long you have gone without smoking, cigarettes not smoked, and money saved.  Get a self-help book about quitting smoking and staying off of tobacco. LEARN NEW SKILLS AND BEHAVIORS  Distract yourself from urges to smoke. Talk to someone, go for a walk, or occupy your time with a task.  Change your normal routine. Take a different route to work. Drink tea instead of coffee.  Eat breakfast in a different place.  Reduce your stress. Take a hot bath, exercise, or read a book.  Plan something enjoyable to do every day. Reward yourself for not smoking.  Explore interactive web-based programs that specialize in helping you quit. GET MEDICINE AND USE IT CORRECTLY Medicines can help you stop smoking and decrease the urge to smoke. Combining medicine with the above behavioral methods and support can greatly increase your chances of successfully quitting smoking.  Nicotine replacement therapy helps deliver nicotine to your body without the negative effects and risks of smoking. Nicotine replacement therapy includes nicotine gum, lozenges, inhalers, nasal sprays, and skin patches. Some may be available over-the-counter and others require a prescription.  Antidepressant medicine helps people abstain from smoking, but how this works is unknown. This medicine is available by prescription.  Nicotinic receptor partial agonist medicine simulates the effect of nicotine in your brain. This medicine is available by prescription. Ask your caregiver for advice about which medicines to use and how to use them based on your health history. Your caregiver will tell you what side effects to look out for if you choose to be on a medicine or therapy. Carefully read the information on the package. Do not use any other product containing nicotine while using a nicotine replacement product.  RELAPSE OR DIFFICULT SITUATIONS Most relapses occur within the first 3 months after quitting. Do not be discouraged if you start smoking again. Remember, most people try several times before finally quitting. You may have symptoms of withdrawal because your body is used to nicotine. You may crave cigarettes, be irritable, feel very hungry, cough often, get headaches, or have difficulty concentrating. The withdrawal symptoms are only temporary. They are strongest when you first quit, but they will go away within  10 14 days. To reduce the chances of relapse, try to:  Avoid drinking alcohol. Drinking lowers your chances of successfully quitting.  Reduce the amount of caffeine you consume. Once you quit smoking, the amount of caffeine in your body increases and can give you symptoms, such as a rapid heartbeat, sweating, and anxiety.  Avoid smokers because they can make you want to smoke.  Do not let weight gain distract you. Many smokers will gain weight when they quit, usually less than 10 pounds. Eat a healthy diet and stay active. You can always lose the weight gained after you quit.  Find ways to improve your mood other than smoking. FOR MORE INFORMATION  www.smokefree.gov  Document Released: 01/23/2001 Document Revised: 07/31/2011 Document Reviewed: 05/10/2011 [employer] Patient Information 2014 [employer].  

## 2012-12-11 ENCOUNTER — Ambulatory Visit (INDEPENDENT_AMBULATORY_CARE_PROVIDER_SITE_OTHER): Payer: BC Managed Care – PPO | Admitting: Diagnostic Neuroimaging

## 2012-12-11 ENCOUNTER — Encounter: Payer: Self-pay | Admitting: Diagnostic Neuroimaging

## 2012-12-11 VITALS — BP 128/78 | HR 67 | Ht 66.0 in | Wt 196.0 lb

## 2012-12-11 DIAGNOSIS — R569 Unspecified convulsions: Secondary | ICD-10-CM

## 2012-12-11 MED ORDER — LEVETIRACETAM ER 500 MG PO TB24: 500.0000 mg | ORAL_TABLET | Freq: Every day | ORAL | Status: DC

## 2012-12-11 NOTE — Patient Instructions (Signed)
Continue current medications. 

## 2012-12-11 NOTE — Progress Notes (Signed)
[facility]  PATIENT: Courtney Mcconnell DOB: 1955/10/02   REFERRING PHYSICIAN:  HISTORY FROM: Patient, daughter REASON FOR VISIT: follow up   HISTORICAL  CHIEF COMPLAINT:  Chief Complaint  Patient presents with  . Follow-up    convulsions/sz    HISTORY OF PRESENT ILLNESS:  UPDATE 12/11/12: Since last visit patient doing well and having no further seizures. She's tolerating levetiracetam XR 500 mg at bedtime without side effects. Patient is under more stress as her husband has been recently diagnosed with cancer.  PRIOR HPI (09/03/12): 57 year old female here for seizure disorder evaluation.   Patient is accompanied by 3 family members. She has no recollection of the 2 episodes.  Family member explains that first episode occurred in March 2014, she awoke at 5:00am, convulsions started in bed, husband could not wake her up. Convulsions lasted about 30 minutes, husband put wet rag on her face and when it stopped and she started to wake up. There was no tongue-biting or incontinence of bowel or bladder.  They did not seek medical care.  Another event happened in the morning on 08/06/12, family reports that patient got up as usual in the morning, went to the bathroom, went to he kitchen, got a cigarette and sat at the kitchen table.  Then she developed staring and then convulsions, lasting about 15 minutes, afterward she was taken to [facility] ED by family.  She was started on LEV 250 mg BID at ED discharge, CT head was negative.  Patient denies any history of stroke or family history of seizure. Patient reports she is only taking LEV 250 mg at night because taking it twice a day made her feel like "she wasn't in the world".  Has had occasional olfactory hallucinations of vinegar, and has had some deja vu episodes.  Current every day smoker.   REVIEW OF SYSTEMS: Full 14 system review of systems performed and notable only for nothing.   ALLERGIES: Allergies  Allergen  Reactions  . Bee Venom Swelling    HOME MEDICATIONS: Prior to Admission medications   Medication Sig Start Date End Date Taking? Authorizing Provider  ALPRAZolam Prudy Feeler) 0.5 MG tablet Take 1 tablet (0.5 mg total) by mouth daily. Take 1/2 to 1 every 12 to 24 hrs PRN. 10/04/12  Yes Deatra Canter, FNP  levETIRAcetam (KEPPRA XR) 500 MG 24 hr tablet Take 1 tablet (500 mg total) by mouth daily. 12/11/12  Yes Suanne Marker, MD  omeprazole (PRILOSEC OTC) 20 MG tablet Take 20 mg by mouth every evening.   Yes Historical Provider, MD    PAST MEDICAL HISTORY: Past Medical History  Diagnosis Date  . Hypertension   . Seizure   . Asthma     PAST SURGICAL HISTORY: Past Surgical History  Procedure Laterality Date  . Abdominal hysterectomy    . Cholecystectomy    . Appendectomy    . Cataract extraction Bilateral     FAMILY HISTORY: Family History  Problem Relation Age of Onset  . Diabetes Father   . Hypertension Father   . Hyperlipidemia Father     SOCIAL HISTORY: History   Social History  . Marital Status: Married    Spouse Name: [name]    Number of Children: 2  . Years of Education: GED   Occupational History  . [employer]    Social History Main Topics  . Smoking status: Current Every Day Smoker -- 1.00 packs/day for 42 years    Types: Cigarettes  .  Smokeless tobacco: Never Used  . Alcohol Use: No  . Drug Use: No  . Sexual Activity: Not on file   Other Topics Concern  . Not on file   Social History Narrative   Patient lives at home with family.   Caffeine Use: 3 pots of coffee daily    PHYSICAL EXAM  Filed Vitals:   12/11/12 1447  BP: 128/78  Pulse: 67  Height: 5\' 6"  (1.676 m)  Weight: 196 lb (88.905 kg)   Body mass index is 31.65 kg/(m^2).  Generalized: In no acute distress, ODOR OF CIGARETTES IN EXAM ROOM.  GENERAL EXAM: Patient is in no distress  CARDIOVASCULAR: Regular rate and rhythm, no murmurs, no carotid bruits  NEUROLOGIC: MENTAL STATUS:  awake, alert, language fluent, comprehension intact, naming intact CRANIAL NERVE: no papilledema on fundoscopic exam, pupils equal and reactive to light, visual fields full to confrontation, extraocular muscles intact, no nystagmus, facial sensation and strength symmetric, uvula midline, shoulder shrug symmetric, tongue midline. MOTOR: normal bulk and tone, full strength in the BUE, BLE SENSORY: normal and symmetric to light touch COORDINATION: finger-nose-finger, fine finger movements, heel-shin normal REFLEXES: deep tendon reflexes present and symmetric GAIT/STATION: narrow based gait; able to walk tandem; romberg is negative   DIAGNOSTIC DATA (LABS, IMAGING, TESTING) - I reviewed patient records, labs, notes, testing and imaging myself where available.  Lab Results  Component Value Date   WBC 6.6 08/06/2012   HGB 13.2 08/06/2012   HCT 39.1 08/06/2012   MCV 96.8 08/06/2012   PLT 209 08/06/2012      Component Value Date/Time   NA 136 08/06/2012 0753   K 4.0 08/06/2012 0753   CL 100 08/06/2012 0753   CO2 29 08/06/2012 0753   GLUCOSE 100* 08/06/2012 0753   BUN 10 08/06/2012 0753   CREATININE 0.78 08/06/2012 0753   CALCIUM 9.3 08/06/2012 0753   PROT 7.0 08/06/2012 0753   ALBUMIN 3.6 08/06/2012 0753   AST 15 08/06/2012 0753   ALT 7 08/06/2012 0753   ALKPHOS 90 08/06/2012 0753   BILITOT 0.3 08/06/2012 0753   GFRNONAA >90 08/06/2012 0753   GFRAA >90 08/06/2012 0753    08/06/12 CT head - no acute findings  09/17/12 MRI brain - few scattered periventricular and subcortical and pontine chronic small vessel ischemic disease. No acute findings.  09/10/12 EEG - normal   ASSESSMENT AND PLAN  57 y.o. year old female  has a past medical history of Hypertension here with 2 recent episodes of witnessed convulsions, 2-3 months apart. Last event on 08/06/12.  Dx: seizure disorder  PLAN: 1. Continue levetiracetam XR 500 mg qhs  2. No driving until seizure free x 6 months  Meds ordered this encounter   Medications  . levETIRAcetam (KEPPRA XR) 500 MG 24 hr tablet    Sig: Take 1 tablet (500 mg total) by mouth daily.    Dispense:  90 tablet    Refill:  4    Order Specific Question:  Supervising Provider    Answer:  Suanne Marker [[id number]]   Return in about 6 months (around 06/11/2013).   Suanne Marker, MD 12/11/2012, 3:36 PM Certified in Neurology, Neurophysiology and Neuroimaging  [facility] [address] [place], [place] [address] [phone]

## 2012-12-18 ENCOUNTER — Other Ambulatory Visit: Payer: Self-pay

## 2013-01-15 ENCOUNTER — Encounter: Payer: Self-pay | Admitting: Nurse Practitioner

## 2013-01-15 ENCOUNTER — Ambulatory Visit (INDEPENDENT_AMBULATORY_CARE_PROVIDER_SITE_OTHER): Payer: BC Managed Care – PPO | Admitting: Nurse Practitioner

## 2013-01-15 VITALS — BP 144/82 | HR 66 | Temp 97.1°F | Ht 66.0 in | Wt 198.0 lb

## 2013-01-15 DIAGNOSIS — J069 Acute upper respiratory infection, unspecified: Secondary | ICD-10-CM

## 2013-01-15 MED ORDER — AZITHROMYCIN 250 MG PO TABS: ORAL_TABLET | ORAL | Status: DC

## 2013-01-15 NOTE — Patient Instructions (Signed)

## 2013-01-15 NOTE — Progress Notes (Signed)
   Subjective:    Patient ID: Courtney Mcconnell, female    DOB: 04-03-1955, 57 y.o.   MRN: [id number]  HPI Patient in c/o cough and congestion- started 2 days ago- Alkaseltzer OTC no relief.    Review of Systems  Constitutional: Positive for fever (2 days ago).  HENT: Positive for congestion, ear pain, rhinorrhea and sinus pressure. Negative for sore throat and trouble swallowing.   Respiratory: Positive for cough (productive- thick white). Negative for shortness of breath.   Cardiovascular: Negative.   All other systems reviewed and are negative.       Objective:   Physical Exam  Constitutional: She is oriented to person, place, and time. She appears well-developed and well-nourished.  HENT:  Right Ear: Hearing, tympanic membrane, external ear and ear canal normal.  Left Ear: Hearing, external ear and ear canal normal. A middle ear effusion is present.  Nose: Mucosal edema and rhinorrhea present. Right sinus exhibits maxillary sinus tenderness. Right sinus exhibits no frontal sinus tenderness. Left sinus exhibits maxillary sinus tenderness. Left sinus exhibits no frontal sinus tenderness.  Mouth/Throat: Uvula is midline, oropharynx is clear and moist and mucous membranes are normal.  Eyes: Conjunctivae are normal. Pupils are equal, round, and reactive to light.  Neck: Normal range of motion. Neck supple.  Cardiovascular: Normal rate, regular rhythm and normal heart sounds.   Pulmonary/Chest: Effort normal and breath sounds normal. No respiratory distress. She has no wheezes. She has no rales. She exhibits no tenderness.  Deep dry cough  Lymphadenopathy:    She has no cervical adenopathy.  Neurological: She is alert and oriented to person, place, and time.  Skin: Skin is warm.    BP 144/82  Pulse 66  Temp(Src) 97.1 F (36.2 C) (Oral)  Ht 5\' 6"  (1.676 m)  Wt 198 lb (89.812 kg)  BMI 31.97 kg/m2       Assessment & Plan:   1. Acute upper respiratory infection    Meds  ordered this encounter  Medications  . azithromycin (ZITHROMAX Z-PAK) 250 MG tablet    Sig: As directed    Dispense:  6 each    Refill:  0    Order Specific Question:  Supervising Provider    Answer:  Ernestina Penna [[id number]]   1. Take meds as prescribed 2. Use a cool mist humidifier especially during the winter months and when heat has been humid. 3. Use saline nose sprays frequently 4. Saline irrigations of the nose can be very helpful if done frequently.  * 4X daily for 1 week*  * Use of a nettie pot can be helpful with this. Follow directions with this* 5. Drink plenty of fluids 6. Keep thermostat turn down low 7.For any cough or congestion  Use plain Mucinex- regular strength or max strength is fine   * Children- consult with Pharmacist for dosing 8. For fever or aces or pains- take tylenol or ibuprofen appropriate for age and weight.  * for fevers greater than 101 orally you may alternate ibuprofen and tylenol every  3 hours.   Mary-Margaret Daphine Deutscher, FNP

## 2013-01-30 ENCOUNTER — Ambulatory Visit (INDEPENDENT_AMBULATORY_CARE_PROVIDER_SITE_OTHER): Payer: BC Managed Care – PPO

## 2013-01-30 ENCOUNTER — Ambulatory Visit (INDEPENDENT_AMBULATORY_CARE_PROVIDER_SITE_OTHER): Payer: BC Managed Care – PPO | Admitting: General Practice

## 2013-01-30 ENCOUNTER — Encounter: Payer: Self-pay | Admitting: General Practice

## 2013-01-30 VITALS — BP 149/87 | HR 62 | Temp 97.0°F | Ht 66.0 in | Wt 194.0 lb

## 2013-01-30 DIAGNOSIS — R059 Cough, unspecified: Secondary | ICD-10-CM

## 2013-01-30 DIAGNOSIS — R05 Cough: Secondary | ICD-10-CM

## 2013-01-30 DIAGNOSIS — R062 Wheezing: Secondary | ICD-10-CM

## 2013-01-30 DIAGNOSIS — J069 Acute upper respiratory infection, unspecified: Secondary | ICD-10-CM

## 2013-01-30 MED ORDER — AMOXICILLIN 875 MG PO TABS: 875.0000 mg | ORAL_TABLET | Freq: Two times a day (BID) | ORAL | Status: DC

## 2013-01-30 MED ORDER — ALBUTEROL SULFATE HFA 108 (90 BASE) MCG/ACT IN AERS: 2.0000 | INHALATION_SPRAY | Freq: Four times a day (QID) | RESPIRATORY_TRACT | Status: DC | PRN

## 2013-01-30 MED ORDER — BENZONATATE 100 MG PO CAPS: 100.0000 mg | ORAL_CAPSULE | Freq: Two times a day (BID) | ORAL | Status: DC | PRN

## 2013-01-30 NOTE — Patient Instructions (Signed)

## 2013-01-30 NOTE — Progress Notes (Signed)
   Subjective:    Patient ID: Courtney Mcconnell, female    DOB: [birth year], 57 y.o.   MRN: [id number]  Cough This is a new problem. Episode onset: Onset 3 weeks ago. The cough is productive of sputum. Associated symptoms include nasal congestion, postnasal drip and wheezing. Pertinent negatives include no chills, fever, sore throat or shortness of breath. The symptoms are aggravated by lying down. She has tried nothing for the symptoms. There is no history of bronchitis, COPD or pneumonia.      Review of Systems  Constitutional: Negative for fever and chills.  HENT: Positive for congestion and postnasal drip. Negative for sore throat.   Respiratory: Positive for cough and wheezing. Negative for chest tightness and shortness of breath.   All other systems reviewed and are negative.       Objective:   Physical Exam  Constitutional: She is oriented to person, place, and time. She appears well-developed and well-nourished.  HENT:  Head: Normocephalic and atraumatic.  Right Ear: External ear normal.  Left Ear: External ear normal.  Mouth/Throat: Oropharynx is clear and moist.  Cardiovascular: Normal rate, regular rhythm and normal heart sounds.   Pulmonary/Chest: Effort normal. No respiratory distress. She has wheezes in the right upper field and the left upper field. She exhibits no tenderness.  Neurological: She is alert and oriented to person, place, and time.  Skin: Skin is warm and dry.  Psychiatric: She has a normal mood and affect.     [facility] reading (PRIMARY) by Coralie Keens, FNP-C, no acute process noted.                                       Assessment & Plan:  1. Cough  - DG Chest 2 View; Future - benzonatate (TESSALON) 100 MG capsule; Take 1 capsule (100 mg total) by mouth 2 (two) times daily as needed for cough.  Dispense: 20 capsule; Refill: 0  2. Upper respiratory infection  - amoxicillin (AMOXIL) 875 MG tablet; Take 1 tablet (875 mg total) by mouth 2 (two)  times daily.  Dispense: 20 tablet; Refill: 0  3. Wheezing  - albuterol (PROVENTIL HFA;VENTOLIN HFA) 108 (90 BASE) MCG/ACT inhaler; Inhale 2 puffs into the lungs every 6 (six) hours as needed for wheezing or shortness of breath.  Dispense: 1 Inhaler; Refill: 2 -RTO if symptoms worsen or unresolved Patient verbalized understanding Coralie Keens, FNP-C

## 2013-02-02 ENCOUNTER — Ambulatory Visit: Payer: BC Managed Care – PPO | Admitting: General Practice

## 2013-04-20 ENCOUNTER — Encounter: Payer: Self-pay | Admitting: Family Medicine

## 2013-04-20 ENCOUNTER — Ambulatory Visit (INDEPENDENT_AMBULATORY_CARE_PROVIDER_SITE_OTHER): Payer: BC Managed Care – PPO | Admitting: Family Medicine

## 2013-04-20 ENCOUNTER — Telehealth: Payer: Self-pay | Admitting: General Practice

## 2013-04-20 VITALS — BP 144/84 | HR 64 | Temp 98.1°F | Ht 66.0 in | Wt 195.0 lb

## 2013-04-20 DIAGNOSIS — J209 Acute bronchitis, unspecified: Secondary | ICD-10-CM

## 2013-04-20 MED ORDER — LEVOFLOXACIN 500 MG PO TABS: 500.0000 mg | ORAL_TABLET | Freq: Every day | ORAL | Status: DC

## 2013-04-20 MED ORDER — METHYLPREDNISOLONE (PAK) 4 MG PO TABS: ORAL_TABLET | ORAL | Status: DC

## 2013-04-20 MED ORDER — HYDROCODONE-HOMATROPINE 5-1.5 MG/5ML PO SYRP: 5.0000 mL | ORAL_SOLUTION | Freq: Three times a day (TID) | ORAL | Status: DC | PRN

## 2013-04-20 NOTE — Telephone Encounter (Signed)
appt made

## 2013-04-20 NOTE — Patient Instructions (Signed)

## 2013-04-20 NOTE — Progress Notes (Signed)
   Subjective:    Patient ID: Courtney Mcconnell, female    DOB: 1955/11/20, 58 y.o.   MRN: [id number]  HPI This 58 y.o. female presents for evaluation of chest wall pain and cough which Is persistent.  She is having URI sx's for over a week.   Review of Systems C/o cough and bronchitis sx's   No chest pain, SOB, HA, dizziness, vision change, N/V, diarrhea, constipation, dysuria, urinary urgency or frequency, myalgias, arthralgias or rash.  Objective:   Physical Exam  Vital signs noted  Well developed well nourished female.  HEENT - Head atraumatic Normocephalic                Eyes - PERRLA, Conjuctiva - clear Sclera- Clear EOMI                Ears - EAC's Wnl TM's Wnl Gross Hearing WN                Throat - oropharanx wnl Respiratory - Lungs diminished with expiratory wheezes Cardiac - RRR S1 and S2 without murmur GI - Abdomen soft Nontender and bowel sounds active x 4      Assessment & Plan:  Acute bronchitis - Plan: methylPREDNIsolone (MEDROL DOSPACK) 4 MG tablet, HYDROcodone-homatropine (HYCODAN) 5-1.5 MG/5ML syrup, levofloxacin (LEVAQUIN) 500 MG tablet Instructed patient to use inhaler and to stop smoking.  Push po fluids, rest, tylenol and motrin otc prn as directed for fever, arthralgias, and myalgias.  Follow up prn if sx's continue or persist.  Lysbeth Penner FNP

## 2013-06-03 ENCOUNTER — Telehealth: Payer: Self-pay | Admitting: Diagnostic Neuroimaging

## 2013-06-03 NOTE — Telephone Encounter (Signed)
A one year Rx was sent to the pharmacy in Oct.   levETIRAcetam (KEPPRA XR) 500 MG 24 hr tablet 90 tablet 4 12/11/2012     Sig - Route: Take 1 tablet (500 mg total) by mouth daily. - Oral    E-Prescribing Status: Receipt confirmed by pharmacy (12/11/2012 3:32 PM EDT)       I called the pharmacy, spoke with Freda Munro, she said there are refills on file, and they will fill Rx today.  I called the patient back.  They are aware.

## 2013-06-03 NOTE — Telephone Encounter (Signed)
Pt's daughter called states pt needs to see about getting her refill on her levETIRAcetam (KEPPRA XR) 500 MG 24 hr tablet, pt just lost her husband and she is not dealing with it well at all. Please call Peggy when this has been called in and Vickii Chafe states once pt can get through this she will c/b to r/s. Thanks

## 2013-06-10 ENCOUNTER — Other Ambulatory Visit: Payer: Self-pay | Admitting: Nurse Practitioner

## 2013-06-11 ENCOUNTER — Ambulatory Visit: Payer: BC Managed Care – PPO | Admitting: Diagnostic Neuroimaging

## 2013-06-22 ENCOUNTER — Telehealth: Payer: Self-pay | Admitting: Diagnostic Neuroimaging

## 2013-06-22 NOTE — Telephone Encounter (Signed)
Patient's daughter calling--has been taking Keppra for seizures--having side effects--medication is making patient depressed and having bad thoughts--states medication needs to be changed-[facility] in [facility] call patient's daughter and advise--thank you.

## 2013-06-22 NOTE — Telephone Encounter (Signed)
I called and spoke to daughter.  Pt lost husband in February.  She is depressed, having bad thoughts.  Pt thinks its the keppra.  She has been on this since 08/2013.  She stopped taking the medication for 3 -4 days per daughter and they noticed and had her restart.  I told her that needs appt for medication change.  Made 06-29-13 at 0900 to evaluate.  She verbalized understanding.

## 2013-06-22 NOTE — Telephone Encounter (Signed)
Patient's daughter is concerned, still has not heard back yet. Please call patient and advise.

## 2013-06-23 NOTE — Telephone Encounter (Signed)
Agree with plan. --VRP 

## 2013-06-24 ENCOUNTER — Telehealth: Payer: Self-pay | Admitting: Family Medicine

## 2013-06-24 ENCOUNTER — Encounter: Payer: Self-pay | Admitting: Family Medicine

## 2013-06-24 ENCOUNTER — Ambulatory Visit (INDEPENDENT_AMBULATORY_CARE_PROVIDER_SITE_OTHER): Payer: BC Managed Care – PPO

## 2013-06-24 ENCOUNTER — Ambulatory Visit (INDEPENDENT_AMBULATORY_CARE_PROVIDER_SITE_OTHER): Payer: BC Managed Care – PPO | Admitting: Family Medicine

## 2013-06-24 VITALS — BP 128/81 | HR 67 | Temp 97.4°F | Ht 66.0 in | Wt 189.4 lb

## 2013-06-24 DIAGNOSIS — J069 Acute upper respiratory infection, unspecified: Secondary | ICD-10-CM

## 2013-06-24 DIAGNOSIS — R0602 Shortness of breath: Secondary | ICD-10-CM

## 2013-06-24 DIAGNOSIS — R0989 Other specified symptoms and signs involving the circulatory and respiratory systems: Secondary | ICD-10-CM

## 2013-06-24 DIAGNOSIS — R05 Cough: Secondary | ICD-10-CM

## 2013-06-24 DIAGNOSIS — R059 Cough, unspecified: Secondary | ICD-10-CM

## 2013-06-24 LAB — POCT CBC
Granulocyte percent: 50 %G (ref 37–80)
HCT, POC: 40.2 % (ref 37.7–47.9)
Hemoglobin: 12.8 g/dL (ref 12.2–16.2)
Lymph, poc: 3.5 — AB (ref 0.6–3.4)
MCH, POC: 30.5 pg (ref 27–31.2)
MCHC: 31.9 g/dL (ref 31.8–35.4)
MCV: 95.4 fL (ref 80–97)
MPV: 8.4 fL (ref 0–99.8)
POC Granulocyte: 4.2 (ref 2–6.9)
POC LYMPH PERCENT: 41.6 %L (ref 10–50)
Platelet Count, POC: 205 10*3/uL (ref 142–424)
RBC: 4.2 M/uL (ref 4.04–5.48)
RDW, POC: 13.1 %
WBC: 8.3 10*3/uL (ref 4.6–10.2)

## 2013-06-24 MED ORDER — AMOXICILLIN 875 MG PO TABS: 875.0000 mg | ORAL_TABLET | Freq: Two times a day (BID) | ORAL | Status: DC

## 2013-06-24 MED ORDER — METHYLPREDNISOLONE (PAK) 4 MG PO TABS: ORAL_TABLET | ORAL | Status: DC

## 2013-06-24 NOTE — Progress Notes (Signed)
   Subjective:    Patient ID: Courtney Mcconnell, female    DOB: 1955/07/28, 58 y.o.   MRN: [id number]  HPI This 58 y.o. female presents for evaluation of cough and congestion.  She thinks she may have pneumonia.  She is a smoker.   Review of Systems C/o cough and uri sx's No chest pain, SOB, HA, dizziness, vision change, N/V, diarrhea, constipation, dysuria, urinary urgency or frequency, myalgias, arthralgias or rash.     Objective:   Physical Exam Vital signs noted  Well developed well nourished female.  HEENT - Head atraumatic Normocephalic                Eyes - PERRLA, Conjuctiva - clear Sclera- Clear EOMI                Ears - EAC's Wnl TM's Wnl Gross Hearing WNL                Throat - oropharanx wnl Respiratory - Lungs CTA bilateral Cardiac - RRR S1 and S2 without murmur GI - Abdomen soft Nontender and bowel sounds active x 4 Extremities - No edema.  CXR - No pneumonia Prelimnary reading by Iverson Alamin  Results for orders placed in visit on 06/24/13  POCT CBC      Result Value Ref Range   WBC 8.3  4.6 - 10.2 K/uL   Lymph, poc 3.5 (*) 0.6 - 3.4   POC LYMPH PERCENT 41.6  10 - 50 %L   MID (cbc)    0 - 0.9   POC MID %    0 - 12 %M   POC Granulocyte 4.2  2 - 6.9   Granulocyte percent 50.0  37 - 80 %G   RBC 4.2  4.04 - 5.48 M/uL   Hemoglobin 12.8  12.2 - 16.2 g/dL   HCT, POC 40.2  37.7 - 47.9 %   MCV 95.4  80 - 97 fL   MCH, POC 30.5  27 - 31.2 pg   MCHC 31.9  31.8 - 35.4 g/dL   RDW, POC 13.1     Platelet Count, POC 205.0  142 - 424 K/uL   MPV 8.4  0 - 99.8 fL      Assessment & Plan:  Cough - Plan: POCT CBC, DG Chest 2 View  Chest congestion - Plan: POCT CBC, DG Chest 2 View  SOB (shortness of breath) - Plan: POCT CBC, DG Chest 2 View  Upper respiratory infection - Plan: methylPREDNIsolone (MEDROL DOSPACK) 4 MG tablet, amoxicillin (AMOXIL) 875 MG tablet  Push po fluids, rest, tylenol and motrin otc prn as directed for fever, arthralgias, and myalgias.   Follow up prn if sx's continue or persist.  Lysbeth Penner FNP

## 2013-06-24 NOTE — Telephone Encounter (Signed)
appt scheduled for 6 but told patient to come at 4:30 for ? xray

## 2013-06-25 ENCOUNTER — Telehealth: Payer: Self-pay | Admitting: Diagnostic Neuroimaging

## 2013-06-25 NOTE — Telephone Encounter (Signed)
Pt information was sent to [facility], [facility]. Please advise

## 2013-06-25 NOTE — Telephone Encounter (Signed)
I called back.  Spoke with daughter.  She said mom had a seizure this morning and she was not sure if the Prednisone and/or Amoxicillin had anything to do with it.  Says she fell in the floor, started convulsing and has bruises on her back from falling.  States she is "okay" now and they did not go to ED.  Patient has an appt on Monday at our office.  Says she has been taking SZ meds as prescribed and wanted a message sent to the provider.  The pharmacist noted no interactions when they dispensed the medications.  She will continue to take all 3 meds as prescribed unless we call her back to advise otherwise.  Thank you.

## 2013-06-25 NOTE — Telephone Encounter (Signed)
Agree with f/u appt. -VRP

## 2013-06-25 NOTE — Telephone Encounter (Signed)
Patients daughter Courtney Mcconnell) calling to ask if methylPREDNIsolone (MEDROL DOSPACK) 4 MG tablet and amoxicillin (AMOXIL) 875 MG tablet prescribed by the PCP, interfere with levETIRAcetam (KEPPRA XR) 500 MG 24 hr tablet.  Please call and advise.

## 2013-06-29 ENCOUNTER — Ambulatory Visit (INDEPENDENT_AMBULATORY_CARE_PROVIDER_SITE_OTHER): Payer: BC Managed Care – PPO | Admitting: Diagnostic Neuroimaging

## 2013-06-29 ENCOUNTER — Encounter: Payer: Self-pay | Admitting: Diagnostic Neuroimaging

## 2013-06-29 VITALS — BP 142/80 | HR 61 | Temp 98.0°F | Ht 65.0 in | Wt 191.0 lb

## 2013-06-29 DIAGNOSIS — R569 Unspecified convulsions: Secondary | ICD-10-CM

## 2013-06-29 DIAGNOSIS — G40309 Generalized idiopathic epilepsy and epileptic syndromes, not intractable, without status epilepticus: Secondary | ICD-10-CM

## 2013-06-29 MED ORDER — PHENYTOIN SODIUM EXTENDED 100 MG PO CAPS: 300.0000 mg | ORAL_CAPSULE | Freq: Every day | ORAL | Status: DC

## 2013-06-29 NOTE — Progress Notes (Signed)
[facility]  PATIENT: Courtney Mcconnell DOB: 1955-11-06  REFERRING CLINICIAN:  HISTORY FROM: patient, daughter, son-in-law REASON FOR VISIT: follow up   HISTORICAL  CHIEF COMPLAINT:  Chief Complaint  Patient presents with  . Follow-up    sz    HISTORY OF PRESENT ILLNESS:    UPDATE 06/29/13: Since last visit, had breakthrough seizure in April 2015 and another one last Thursday (06/25/13). She had been having cough, dx'd with URI and rx'd medrol + amoxicillin. Seizure occurred at 2am, in her sleep. She was found by family in the bathroom, on the floor, convulsing, bruised, eyes barely open. Lasted ~ 10-20 min. Then post ictal snoring 15-20 min, then confusion and wandering around the home for next 1 hour. Admits that she has been missing doses of LEV XR 500mg  daily because of cost ($86/month) and side effects (agitation, mood, depression).   UPDATE 12/11/12: Since last visit patient doing well and having no further seizures. She's tolerating levetiracetam XR 500 mg at bedtime without side effects. Patient is under more stress as her husband has been recently diagnosed with cancer.   PRIOR HPI (09/03/12): 58 year old female here for seizure disorder evaluation.  Patient is accompanied by 3 family members. She has no recollection of the 2 episodes. Family member explains that first episode occurred in March 2014, she awoke at 5:00am, convulsions started in bed, husband could not wake her up. Convulsions lasted about 30 minutes, husband put wet rag on her face and when it stopped and she started to wake up. There was no tongue-biting or incontinence of bowel or bladder. They did not seek medical care.   Another event happened in the morning on 08/06/12, family reports that patient got up as usual in the morning, went to the bathroom, went to he kitchen, got a cigarette and sat at the kitchen table. Then she developed staring and then convulsions, lasting about 15 minutes,  afterward she was taken to [facility] ED by family. She was started on LEV 250 mg BID at ED discharge, CT head was negative.   Patient denies any history of stroke or family history of seizure. Patient reports she is only taking LEV 250 mg at night because taking it twice a day made her feel like "she wasn't in the world". Has had occasional olfactory hallucinations of vinegar, and has had some deja vu episodes. Current every day smoker.    REVIEW OF SYSTEMS: Full 14 system review of systems performed and notable only for bruising memory loss headache numbness seizure weakness agitation confusion depression anxiety joint pain back pain wounds skin moles.  ALLERGIES: Allergies  Allergen Reactions  . Bee Venom Swelling    HOME MEDICATIONS: Outpatient Prescriptions Prior to Visit  Medication Sig Dispense Refill  . albuterol (PROVENTIL HFA;VENTOLIN HFA) 108 (90 BASE) MCG/ACT inhaler Inhale 2 puffs into the lungs every 6 (six) hours as needed for wheezing or shortness of breath.  1 Inhaler  2  . ALPRAZolam (XANAX) 0.5 MG tablet Take 1 tablet (0.5 mg total) by mouth daily. Take 1/2 to 1 every 12 to 24 hrs PRN.  30 tablet  1  . amoxicillin (AMOXIL) 875 MG tablet Take 1 tablet (875 mg total) by mouth 2 (two) times daily.  20 tablet  0  . methylPREDNIsolone (MEDROL DOSPACK) 4 MG tablet follow package directions  21 tablet  0  . omeprazole (PRILOSEC OTC) 20 MG tablet Take 20 mg by mouth every evening.      Marland Kitchen  levETIRAcetam (KEPPRA XR) 500 MG 24 hr tablet TAKE 1 TABLET (500 MG TOTAL)  BY MOUTH DAILY.  90 tablet  1   No facility-administered medications prior to visit.    PAST MEDICAL HISTORY: Past Medical History  Diagnosis Date  . Hypertension   . Seizure   . Asthma     PAST SURGICAL HISTORY: Past Surgical History  Procedure Laterality Date  . Abdominal hysterectomy    . Cholecystectomy    . Appendectomy    . Cataract extraction Bilateral     FAMILY HISTORY: Family History  Problem  Relation Age of Onset  . Diabetes Father   . Hypertension Father   . Hyperlipidemia Father     SOCIAL HISTORY:  History   Social History  . Marital Status: Married    Spouse Name: [name]    Number of Children: 2  . Years of Education: GED   Occupational History  . [employer]OOK    Social History Main Topics  . Smoking status: Current Every Day Smoker -- 1.00 packs/day for 42 years    Types: Cigarettes  . Smokeless tobacco: Never Used  . Alcohol Use: No  . Drug Use: No  . Sexual Activity: Not on file   Other Topics Concern  . Not on file   Social History Narrative   Patient lives at home with family.   Caffeine Use: 3 pots of coffee daily     PHYSICAL EXAM  Filed Vitals:   06/29/13 0917  BP: 142/80  Pulse: 61  Temp: 98 F (36.7 C)  Height: 5\' 5"  (1.651 m)  Weight: 191 lb (86.637 kg)    Not recorded    Body mass index is 31.78 kg/(m^2).  GENERAL EXAM: Patient is in no distress; well developed, nourished and groomed; neck is supple; DENTURES. SKIN ABRASIONS AND SCRATCHES ON HANDS/ARMS.   CARDIOVASCULAR: Regular rate and rhythm, no murmurs, no carotid bruits  NEUROLOGIC: MENTAL STATUS: awake, alert, language fluent, comprehension intact, naming intact, fund of knowledge appropriate CRANIAL NERVE: no papilledema on fundoscopic exam, pupils equal and reactive to light, visual fields full to confrontation, extraocular muscles intact, no nystagmus, facial sensation and strength symmetric, hearing intact, palate elevates symmetrically, uvula midline, shoulder shrug symmetric, tongue midline. MOTOR: normal bulk and tone, full strength in the BUE, BLE SENSORY: normal and symmetric to light touch  COORDINATION: finger-nose-finger, fine finger movements normal REFLEXES: deep tendon reflexes present and symmetric GAIT/STATION: narrow based gait; CANNOT TANDEM   DIAGNOSTIC DATA (LABS, IMAGING, TESTING) - I reviewed patient records, labs, notes, testing and imaging myself  where available.  Lab Results  Component Value Date   WBC 8.3 06/24/2013   HGB 12.8 06/24/2013   HCT 40.2 06/24/2013   MCV 95.4 06/24/2013   PLT 209 08/06/2012      Component Value Date/Time   NA 136 08/06/2012 0753   K 4.0 08/06/2012 0753   CL 100 08/06/2012 0753   CO2 29 08/06/2012 0753   GLUCOSE 100* 08/06/2012 0753   BUN 10 08/06/2012 0753   CREATININE 0.78 08/06/2012 0753   CALCIUM 9.3 08/06/2012 0753   PROT 7.0 08/06/2012 0753   ALBUMIN 3.6 08/06/2012 0753   AST 15 08/06/2012 0753   ALT 7 08/06/2012 0753   ALKPHOS 90 08/06/2012 0753   BILITOT 0.3 08/06/2012 0753   GFRNONAA >90 08/06/2012 0753   GFRAA >90 08/06/2012 0753   No results found for this basename: CHOL, HDL, LDLCALC, LDLDIRECT, TRIG, CHOLHDL   No results found for this  basename: HGBA1C   No results found for this basename: VITAMINB12   No results found for this basename: TSH    08/06/12 CT head - no acute findings   09/17/12 MRI brain - few scattered periventricular and subcortical and pontine chronic small vessel ischemic disease. No acute findings.   09/10/12 EEG - normal   ASSESSMENT AND PLAN  59 y.o. year old female here with with multiple episodes of nocturnal, witnessed convulsions, suspicious for generalized seizure disorder. Last event on 06/25/13. Poor compliance with levetiracetam XR due to cost and side effects.   PLAN:  1. Start phenytoin 300mg  qhs 2. Continue levetiracetam XR 500 mg qhs x 1 week then stop 3. No driving until seizure free x 6 months  Meds ordered this encounter  Medications  . phenytoin (DILANTIN) 100 MG ER capsule    Sig: Take 3 capsules (300 mg total) by mouth at bedtime.    Dispense:  90 capsule    Refill:  12    Return in about 3 months (around 09/29/2013).    Penni Bombard, MD 03/26/9415, 40:81 AM Certified in Neurology, Neurophysiology and Neuroimaging  [facility] [address] [place], [place] [address] [phone]

## 2013-06-29 NOTE — Patient Instructions (Signed)
Start phenytoin 300mg  at bedtime.  Continue levetiracetam for 1 more week (overlapping with phenytoin) then stop levetiracetam.

## 2013-07-22 ENCOUNTER — Encounter (INDEPENDENT_AMBULATORY_CARE_PROVIDER_SITE_OTHER): Payer: BC Managed Care – PPO | Admitting: Ophthalmology

## 2013-07-22 DIAGNOSIS — H35379 Puckering of macula, unspecified eye: Secondary | ICD-10-CM

## 2013-07-22 DIAGNOSIS — H26499 Other secondary cataract, unspecified eye: Secondary | ICD-10-CM

## 2013-07-22 DIAGNOSIS — H43819 Vitreous degeneration, unspecified eye: Secondary | ICD-10-CM

## 2013-07-29 ENCOUNTER — Ambulatory Visit (INDEPENDENT_AMBULATORY_CARE_PROVIDER_SITE_OTHER): Payer: BC Managed Care – PPO | Admitting: Family

## 2013-07-29 ENCOUNTER — Encounter: Payer: Self-pay | Admitting: Family

## 2013-07-29 VITALS — BP 114/63 | HR 65 | Temp 98.3°F | Resp 18 | Ht 66.0 in | Wt 188.0 lb

## 2013-07-29 DIAGNOSIS — J069 Acute upper respiratory infection, unspecified: Secondary | ICD-10-CM

## 2013-07-29 MED ORDER — BENZONATATE 200 MG PO CAPS: 200.0000 mg | ORAL_CAPSULE | Freq: Three times a day (TID) | ORAL | Status: DC | PRN

## 2013-07-29 MED ORDER — AMOXICILLIN-POT CLAVULANATE 875-125 MG PO TABS: 1.0000 | ORAL_TABLET | Freq: Two times a day (BID) | ORAL | Status: DC

## 2013-07-29 NOTE — Patient Instructions (Signed)
Upper Respiratory Infection, Adult An upper respiratory infection (URI) is also sometimes known as the common cold. The upper respiratory tract includes the nose, sinuses, throat, trachea, and bronchi. Bronchi are the airways leading to the lungs. Most people improve within 1 week, but symptoms can last up to 2 weeks. A residual cough may last even longer.  CAUSES Many different viruses can infect the tissues lining the upper respiratory tract. The tissues become irritated and inflamed and often become very moist. Mucus production is also common. A cold is contagious. You can easily spread the virus to others by oral contact. This includes kissing, sharing a glass, coughing, or sneezing. Touching your mouth or nose and then touching a surface, which is then touched by another person, can also spread the virus. SYMPTOMS  Symptoms typically develop 1 to 3 days after you come in contact with a cold virus. Symptoms vary from person to person. They may include:  Runny nose.  Sneezing.  Nasal congestion.  Sinus irritation.  Sore throat.  Loss of voice (laryngitis).  Cough.  Fatigue.  Muscle aches.  Loss of appetite.  Headache.  Low-grade fever. DIAGNOSIS  You might diagnose your own cold based on familiar symptoms, since most people get a cold 2 to 3 times a year. Your caregiver can confirm this based on your exam. Most importantly, your caregiver can check that your symptoms are not due to another disease such as strep throat, sinusitis, pneumonia, asthma, or epiglottitis. Blood tests, throat tests, and X-rays are not necessary to diagnose a common cold, but they may sometimes be helpful in excluding other more serious diseases. Your caregiver will decide if any further tests are required. RISKS AND COMPLICATIONS  You may be at risk for a more severe case of the common cold if you smoke cigarettes, have chronic heart disease (such as heart failure) or lung disease (such as asthma), or if  you have a weakened immune system. The very young and very old are also at risk for more serious infections. Bacterial sinusitis, middle ear infections, and bacterial pneumonia can complicate the common cold. The common cold can worsen asthma and chronic obstructive pulmonary disease (COPD). Sometimes, these complications can require emergency medical care and may be life-threatening. PREVENTION  The best way to protect against getting a cold is to practice good hygiene. Avoid oral or hand contact with people with cold symptoms. Wash your hands often if contact occurs. There is no clear evidence that vitamin C, vitamin E, echinacea, or exercise reduces the chance of developing a cold. However, it is always recommended to get plenty of rest and practice good nutrition. TREATMENT  Treatment is directed at relieving symptoms. There is no cure. Antibiotics are not effective, because the infection is caused by a virus, not by bacteria. Treatment may include:  Increased fluid intake. Sports drinks offer valuable electrolytes, sugars, and fluids.  Breathing heated mist or steam (vaporizer or shower).  Eating chicken soup or other clear broths, and maintaining good nutrition.  Getting plenty of rest.  Using gargles or lozenges for comfort.  Controlling fevers with ibuprofen or acetaminophen as directed by your caregiver.  Increasing usage of your inhaler if you have asthma. Zinc gel and zinc lozenges, taken in the first 24 hours of the common cold, can shorten the duration and lessen the severity of symptoms. Pain medicines may help with fever, muscle aches, and throat pain. A variety of non-prescription medicines are available to treat congestion and runny nose. Your caregiver   can make recommendations and may suggest nasal or lung inhalers for other symptoms.  HOME CARE INSTRUCTIONS   Only take over-the-counter or prescription medicines for pain, discomfort, or fever as directed by your  caregiver.  Use a warm mist humidifier or inhale steam from a shower to increase air moisture. This may keep secretions moist and make it easier to breathe.  Drink enough water and fluids to keep your urine clear or pale yellow.  Rest as needed.  Return to work when your temperature has returned to normal or as your caregiver advises. You may need to stay home longer to avoid infecting others. You can also use a face mask and careful hand washing to prevent spread of the virus. SEEK MEDICAL CARE IF:   After the first few days, you feel you are getting worse rather than better.  You need your caregiver's advice about medicines to control symptoms.  You develop chills, worsening shortness of breath, or brown or red sputum. These may be signs of pneumonia.  You develop yellow or brown nasal discharge or pain in the face, especially when you bend forward. These may be signs of sinusitis.  You develop a fever, swollen neck glands, pain with swallowing, or white areas in the back of your throat. These may be signs of strep throat. SEEK IMMEDIATE MEDICAL CARE IF:   You have a fever.  You develop severe or persistent headache, ear pain, sinus pain, or chest pain.  You develop wheezing, a prolonged cough, cough up blood, or have a change in your usual mucus (if you have chronic lung disease).  You develop sore muscles or a stiff neck. Document Released: 07/25/2000 Document Revised: 04/23/2011 Document Reviewed: 06/02/2010 [facility] Patient Information 2015 [place], [place]. This information is not intended to replace advice given to you by your health care provider. Make sure you discuss any questions you have with your health care provider.  - Take meds as prescribed - Use a cool mist humidifier  -Use saline nose sprays frequently -Saline irrigations of the nose can be very helpful if done frequently.  * 4X daily for 1 week*  * Use of a nettie pot can be helpful with this. Follow  directions with this* -Force fluids -For any cough or congestion  Use plain Mucinex- regular strength or max strength is fine   * Children- consult with Pharmacist for dosing -For fever or aces or pains- take tylenol or ibuprofen appropriate for age and weight.  * for fevers greater than 101 orally you may alternate ibuprofen and tylenol every  3 hours. -Throat lozenges if help   Evelina Dun, FNP Smoking Cessation Quitting smoking is important to your health and has many advantages. However, it is not always easy to quit since nicotine is a very addictive drug. Often times, people try 3 times or more before being able to quit. This document explains the best ways for you to prepare to quit smoking. Quitting takes hard work and a lot of effort, but you can do it. ADVANTAGES OF QUITTING SMOKING  You will live longer, feel better, and live better.  Your body will feel the impact of quitting smoking almost immediately.  Within 20 minutes, blood pressure decreases. Your pulse returns to its normal level.  After 8 hours, carbon monoxide levels in the blood return to normal. Your oxygen level increases.  After 24 hours, the chance of having a heart attack starts to decrease. Your breath, hair, and body stop smelling like smoke.  After 48 hours, damaged nerve endings begin to recover. Your sense of taste and smell improve.  After 72 hours, the body is virtually free of nicotine. Your bronchial tubes relax and breathing becomes easier.  After 2 to 12 weeks, lungs can hold more air. Exercise becomes easier and circulation improves.  The risk of having a heart attack, stroke, cancer, or lung disease is greatly reduced.  After 1 year, the risk of coronary heart disease is cut in half.  After 5 years, the risk of stroke falls to the same as a nonsmoker.  After 10 years, the risk of lung cancer is cut in half and the risk of other cancers decreases significantly.  After 15 years, the risk  of coronary heart disease drops, usually to the level of a nonsmoker.  If you are pregnant, quitting smoking will improve your chances of having a healthy baby.  The people you live with, especially any children, will be healthier.  You will have extra money to spend on things other than cigarettes. QUESTIONS TO THINK ABOUT BEFORE ATTEMPTING TO QUIT You may want to talk about your answers with your caregiver.  Why do you want to quit?  If you tried to quit in the past, what helped and what did not?  What will be the most difficult situations for you after you quit? How will you plan to handle them?  Who can help you through the tough times? Your family? Friends? A caregiver?  What pleasures do you get from smoking? What ways can you still get pleasure if you quit? Here are some questions to ask your caregiver:  How can you help me to be successful at quitting?  What medicine do you think would be best for me and how should I take it?  What should I do if I need more help?  What is smoking withdrawal like? How can I get information on withdrawal? GET READY  Set a quit date.  Change your environment by getting rid of all cigarettes, ashtrays, matches, and lighters in your home, car, or work. Do not let people smoke in your home.  Review your past attempts to quit. Think about what worked and what did not. GET SUPPORT AND ENCOURAGEMENT You have a better chance of being successful if you have help. You can get support in many ways.  Tell your family, friends, and co-workers that you are going to quit and need their support. Ask them not to smoke around you.  Get individual, group, or telephone counseling and support. Programs are available at [employer] and health centers. Call your local health department for information about programs in your area.  Spiritual beliefs and practices may help some smokers quit.  Download a "quit meter" on your computer to keep track of  quit statistics, such as how long you have gone without smoking, cigarettes not smoked, and money saved.  Get a self-help book about quitting smoking and staying off of tobacco. Waucoma yourself from urges to smoke. Talk to someone, go for a walk, or occupy your time with a task.  Change your normal routine. Take a different route to work. Drink tea instead of coffee. Eat breakfast in a different place.  Reduce your stress. Take a hot bath, exercise, or read a book.  Plan something enjoyable to do every day. Reward yourself for not smoking.  Explore interactive web-based programs that specialize in helping you quit. GET MEDICINE AND USE IT  CORRECTLY Medicines can help you stop smoking and decrease the urge to smoke. Combining medicine with the above behavioral methods and support can greatly increase your chances of successfully quitting smoking.  Nicotine replacement therapy helps deliver nicotine to your body without the negative effects and risks of smoking. Nicotine replacement therapy includes nicotine gum, lozenges, inhalers, nasal sprays, and skin patches. Some may be available over-the-counter and others require a prescription.  Antidepressant medicine helps people abstain from smoking, but how this works is unknown. This medicine is available by prescription.  Nicotinic receptor partial agonist medicine simulates the effect of nicotine in your brain. This medicine is available by prescription. Ask your caregiver for advice about which medicines to use and how to use them based on your health history. Your caregiver will tell you what side effects to look out for if you choose to be on a medicine or therapy. Carefully read the information on the package. Do not use any other product containing nicotine while using a nicotine replacement product.  RELAPSE OR DIFFICULT SITUATIONS Most relapses occur within the first 3 months after quitting. Do not be  discouraged if you start smoking again. Remember, most people try several times before finally quitting. You may have symptoms of withdrawal because your body is used to nicotine. You may crave cigarettes, be irritable, feel very hungry, cough often, get headaches, or have difficulty concentrating. The withdrawal symptoms are only temporary. They are strongest when you first quit, but they will go away within 10-14 days. To reduce the chances of relapse, try to:  Avoid drinking alcohol. Drinking lowers your chances of successfully quitting.  Reduce the amount of caffeine you consume. Once you quit smoking, the amount of caffeine in your body increases and can give you symptoms, such as a rapid heartbeat, sweating, and anxiety.  Avoid smokers because they can make you want to smoke.  Do not let weight gain distract you. Many smokers will gain weight when they quit, usually less than 10 pounds. Eat a healthy diet and stay active. You can always lose the weight gained after you quit.  Find ways to improve your mood other than smoking. FOR MORE INFORMATION  www.smokefree.gov  Document Released: 01/23/2001 Document Revised: 07/31/2011 Document Reviewed: 05/10/2011 [facility] [place] Patient Information 2015 [place], [place]. This information is not intended to replace advice given to you by your health care provider. Make sure you discuss any questions you have with your health care provider.

## 2013-07-29 NOTE — Progress Notes (Signed)
Subjective:    Patient ID: Courtney Mcconnell, female    DOB: 10-Dec-1955, 58 y.o.   MRN: [id number]  Cough This is a recurrent problem. The current episode started in the past 7 days. The problem has been unchanged. The problem occurs hourly. The cough is productive of purulent sputum. Associated symptoms include headaches, nasal congestion, postnasal drip, rhinorrhea, shortness of breath and wheezing. Pertinent negatives include no chills, ear congestion, ear pain, fever or sore throat. The symptoms are aggravated by lying down. Risk factors for lung disease include smoking/tobacco exposure. Treatments tried: Amoxicillin and zpak. The treatment provided mild relief. There is no history of asthma.   *Pt was seen in the office on 5/13 with the same systoms and had chest x-ray done. Pt was dx with URI and given z pak and amoxicillin. Pt states she felt the amoxicillin helped but she just "needed a refill" to be completely better.   Review of Systems  Constitutional: Negative for fever and chills.  HENT: Positive for postnasal drip and rhinorrhea. Negative for ear pain and sore throat.   Respiratory: Positive for cough, shortness of breath and wheezing.   Cardiovascular: Negative.   Gastrointestinal: Negative.   Genitourinary: Negative.   Musculoskeletal: Negative.   Neurological: Positive for headaches.  Hematological: Negative.   All other systems reviewed and are negative.      Objective:   Physical Exam  Vitals reviewed. Constitutional: She is oriented to person, place, and time. She appears well-developed and well-nourished. No distress.  HENT:  Head: Normocephalic and atraumatic.  Right Ear: External ear normal.  Left Ear: External ear normal.  Bilateral nasal passage erythemas with mild swelling, oropharynx erythemas   Eyes: Pupils are equal, round, and reactive to light.  Neck: Normal range of motion. Neck supple. No thyromegaly present.  Cardiovascular: Normal rate, regular  rhythm, normal heart sounds and intact distal pulses.   No murmur heard. Pulmonary/Chest: Effort normal and breath sounds normal. No respiratory distress. She has no wheezes.  Abdominal: Soft. Bowel sounds are normal. She exhibits no distension. There is no tenderness.  Musculoskeletal: Normal range of motion. She exhibits no edema and no tenderness.  Neurological: She is alert and oriented to person, place, and time.  Skin: Skin is warm and dry.  Psychiatric: She has a normal mood and affect. Her behavior is normal. Judgment and thought content normal.     BP 114/63  Pulse 65  Temp(Src) 98.3 F (36.8 C) (Oral)  Resp 18  Ht 5\' 6"  (1.676 m)  Wt 188 lb (85.276 kg)  BMI 30.36 kg/m2  SpO2 96%      Assessment & Plan:  1. Upper respiratory infection -- Take meds as prescribed - Use a cool mist humidifier  -Use saline nose sprays frequently -Saline irrigations of the nose can be very helpful if done frequently.  * 4X daily for 1 week*  * Use of a nettie pot can be helpful with this. Follow directions with this* -Force fluids -For any cough or congestion  Use plain Mucinex- regular strength or max strength is fine   * Children- consult with Pharmacist for dosing -For fever or aces or pains- take tylenol or ibuprofen appropriate for age and weight.  * for fevers greater than 101 orally you may alternate ibuprofen and tylenol every  3 hours. -Throat lozenges if help - amoxicillin-clavulanate (AUGMENTIN) 875-125 MG per tablet; Take 1 tablet by mouth 2 (two) times daily.  Dispense: 28 tablet; Refill: 0 - benzonatate (  TESSALON) 200 MG capsule; Take 1 capsule (200 mg total) by mouth 3 (three) times daily as needed for cough.  Dispense: 30 capsule; Refill: 0  Evelina Dun, FNP

## 2013-08-05 ENCOUNTER — Encounter (INDEPENDENT_AMBULATORY_CARE_PROVIDER_SITE_OTHER): Payer: BC Managed Care – PPO | Admitting: Ophthalmology

## 2013-08-11 ENCOUNTER — Encounter (INDEPENDENT_AMBULATORY_CARE_PROVIDER_SITE_OTHER): Payer: BC Managed Care – PPO | Admitting: Ophthalmology

## 2013-08-11 DIAGNOSIS — H35379 Puckering of macula, unspecified eye: Secondary | ICD-10-CM

## 2013-08-11 DIAGNOSIS — H35371 Puckering of macula, right eye: Secondary | ICD-10-CM | POA: Diagnosis present

## 2013-08-11 NOTE — H&P (Signed)
Courtney Mcconnell is an 58 y.o. female.   Chief Complaint: loss of vision right eye HPI: loss of vision over past 6 months  Past Medical History  Diagnosis Date  . Hypertension   . Seizure   . Asthma     Past Surgical History  Procedure Laterality Date  . Abdominal hysterectomy    . Cholecystectomy    . Appendectomy    . Cataract extraction Bilateral     Family History  Problem Relation Age of Onset  . Diabetes Father   . Hypertension Father   . Hyperlipidemia Father    Social History:  reports that she has been smoking Cigarettes.  She has a 42 pack-year smoking history. She has never used smokeless tobacco. She reports that she does not drink alcohol or use illicit drugs.  Allergies:  Allergies  Allergen Reactions  . Bee Venom Swelling    No prescriptions prior to admission    Review of systems otherwise negative  There were no vitals taken for this visit.  Physical exam: Mental status: oriented x3. Eyes: See eye exam associated with this date of surgery in media tab.  Scanned in by scanning center Ears, Nose, Throat: within normal limits Neck: Within Normal limits General: within normal limits Chest: Within normal limits Breast: deferred Heart: Within normal limits Abdomen: Within normal limits GU: deferred Extremities: within normal limits Skin: within normal limits  Assessment/Plan Preretinal fibrosis with edema, right eye Plan: To [facility] for Pars plana vitrectomy, membrane peel, laser and gas injection right eye  Eliyah Bazzi, Chrystie Nose 08/11/2013, 10:58 AM

## 2013-08-14 ENCOUNTER — Encounter (HOSPITAL_COMMUNITY): Payer: Self-pay | Admitting: Pharmacy Technician

## 2013-08-18 ENCOUNTER — Ambulatory Visit (INDEPENDENT_AMBULATORY_CARE_PROVIDER_SITE_OTHER): Payer: BC Managed Care – PPO | Admitting: Family Medicine

## 2013-08-18 ENCOUNTER — Encounter: Payer: Self-pay | Admitting: Family Medicine

## 2013-08-18 VITALS — BP 121/73 | HR 69 | Temp 97.5°F | Ht 66.0 in | Wt 188.8 lb

## 2013-08-18 DIAGNOSIS — W57XXXA Bitten or stung by nonvenomous insect and other nonvenomous arthropods, initial encounter: Secondary | ICD-10-CM

## 2013-08-18 DIAGNOSIS — T148 Other injury of unspecified body region: Secondary | ICD-10-CM

## 2013-08-18 DIAGNOSIS — J209 Acute bronchitis, unspecified: Secondary | ICD-10-CM

## 2013-08-18 MED ORDER — HYDROCODONE-HOMATROPINE 5-1.5 MG/5ML PO SYRP: 5.0000 mL | ORAL_SOLUTION | Freq: Three times a day (TID) | ORAL | Status: DC | PRN

## 2013-08-18 MED ORDER — DOXYCYCLINE HYCLATE 100 MG PO TABS: 100.0000 mg | ORAL_TABLET | Freq: Two times a day (BID) | ORAL | Status: DC

## 2013-08-18 NOTE — Progress Notes (Signed)
   Subjective:    Patient ID: Courtney Mcconnell, female    DOB: 1955/03/11, 58 y.o.   MRN: [id number]  HPI  C/o insect bite on her abdomen she noticed this am and does not know what bit her.  Review of Systems    No chest pain, SOB, HA, dizziness, vision change, N/V, diarrhea, constipation, dysuria, urinary urgency or frequency, myalgias, arthralgias or rash.  Objective:   Physical Exam Vital signs noted  Well developed well nourished female.  HEENT - Head atraumatic Normocephalic Respiratory - Lungs CTA bilateral Cardiac - RRR S1 and S2 without murmur Skin - Small erythematous area with central opening on upper abdomen       Assessment & Plan:    Insect bite - Plan: doxycycline (VIBRA-TABS) 100 MG tablet  Lysbeth Penner FNP

## 2013-08-26 ENCOUNTER — Encounter (HOSPITAL_COMMUNITY): Payer: Self-pay | Admitting: *Deleted

## 2013-08-26 MED ORDER — GATIFLOXACIN 0.5 % OP SOLN: 1.0000 [drp] | OPHTHALMIC | Status: DC | PRN

## 2013-08-26 MED ORDER — CEFAZOLIN SODIUM-DEXTROSE 2-3 GM-% IV SOLR
2.0000 g | INTRAVENOUS | Status: DC
  Filled 2013-08-26: qty 50

## 2013-08-26 MED ORDER — PHENYLEPHRINE HCL 2.5 % OP SOLN: 1.0000 [drp] | OPHTHALMIC | Status: DC | PRN

## 2013-08-26 MED ORDER — CYCLOPENTOLATE HCL 1 % OP SOLN: 1.0000 [drp] | OPHTHALMIC | Status: DC | PRN

## 2013-08-26 MED ORDER — TROPICAMIDE 1 % OP SOLN: 1.0000 [drp] | OPHTHALMIC | Status: DC | PRN

## 2013-08-26 NOTE — Progress Notes (Signed)
Pt denies SOB, chest pain, and being under the care of a cardiologist. Pt denies having a stress test, echo, and cardiac cath done. Pt stated that she was prescribed Amoxicillin and Doxycycline in which she broke out in a rash. Allergies was updated with new info during SDW pre-op call.

## 2013-08-27 ENCOUNTER — Ambulatory Visit (HOSPITAL_COMMUNITY)
Admission: RE | Admit: 2013-08-27 | Discharge: 2013-08-27 | Disposition: A | Discharge status: Home / Self Care | Payer: BC Managed Care – PPO | Source: Ambulatory Visit | Attending: Ophthalmology | Admitting: Ophthalmology

## 2013-08-27 ENCOUNTER — Encounter (HOSPITAL_COMMUNITY): Payer: Self-pay | Admitting: Certified Registered"

## 2013-08-27 ENCOUNTER — Encounter (HOSPITAL_COMMUNITY): Payer: BC Managed Care – PPO | Admitting: Certified Registered"

## 2013-08-27 ENCOUNTER — Encounter (HOSPITAL_COMMUNITY)
Admission: RE | Disposition: A | Discharge status: Home / Self Care | Payer: Self-pay | Source: Ambulatory Visit | Attending: Ophthalmology

## 2013-08-27 ENCOUNTER — Ambulatory Visit (HOSPITAL_COMMUNITY): Payer: BC Managed Care – PPO | Admitting: Certified Registered"

## 2013-08-27 ENCOUNTER — Ambulatory Visit (HOSPITAL_COMMUNITY): Payer: BC Managed Care – PPO

## 2013-08-27 DIAGNOSIS — H35379 Puckering of macula, unspecified eye: Secondary | ICD-10-CM

## 2013-08-27 DIAGNOSIS — K219 Gastro-esophageal reflux disease without esophagitis: Secondary | ICD-10-CM | POA: Insufficient documentation

## 2013-08-27 DIAGNOSIS — J45909 Unspecified asthma, uncomplicated: Secondary | ICD-10-CM | POA: Insufficient documentation

## 2013-08-27 DIAGNOSIS — H33309 Unspecified retinal break, unspecified eye: Secondary | ICD-10-CM | POA: Insufficient documentation

## 2013-08-27 DIAGNOSIS — F17200 Nicotine dependence, unspecified, uncomplicated: Secondary | ICD-10-CM | POA: Insufficient documentation

## 2013-08-27 DIAGNOSIS — I10 Essential (primary) hypertension: Secondary | ICD-10-CM | POA: Insufficient documentation

## 2013-08-27 DIAGNOSIS — H35371 Puckering of macula, right eye: Secondary | ICD-10-CM | POA: Diagnosis present

## 2013-08-27 HISTORY — PX: LASER PHOTO ABLATION: SHX5942

## 2013-08-27 HISTORY — DX: Pneumonia, unspecified organism: J18.9

## 2013-08-27 HISTORY — PX: PARS PLANA VITRECTOMY: SHX2166

## 2013-08-27 HISTORY — DX: Gastro-esophageal reflux disease without esophagitis: K21.9

## 2013-08-27 HISTORY — PX: MEMBRANE PEEL: SHX5967

## 2013-08-27 HISTORY — DX: Headache: R51

## 2013-08-27 HISTORY — DX: Other bursitis of hip, left hip: M70.72

## 2013-08-27 LAB — CBC
HCT: 38.6 % (ref 36.0–46.0)
Hemoglobin: 12.7 g/dL (ref 12.0–15.0)
MCH: 31.9 pg (ref 26.0–34.0)
MCHC: 32.9 g/dL (ref 30.0–36.0)
MCV: 97 fL (ref 78.0–100.0)
Platelets: 201 10*3/uL (ref 150–400)
RBC: 3.98 MIL/uL (ref 3.87–5.11)
RDW: 13.4 % (ref 11.5–15.5)
WBC: 6 10*3/uL (ref 4.0–10.5)

## 2013-08-27 LAB — BASIC METABOLIC PANEL
Anion gap: 12 (ref 5–15)
BUN: 12 mg/dL (ref 6–23)
CO2: 26 mEq/L (ref 19–32)
Calcium: 9 mg/dL (ref 8.4–10.5)
Chloride: 105 mEq/L (ref 96–112)
Creatinine, Ser: 0.79 mg/dL (ref 0.50–1.10)
GFR calc Af Amer: >90 mL/min (ref >90)
Glucose, Bld: 95 mg/dL (ref 70–99)
POTASSIUM: 4.2 meq/L (ref 3.7–5.3)
SODIUM: 143 meq/L (ref 137–147)

## 2013-08-27 SURGERY — PARS PLANA VITRECTOMY WITH 25 GAUGE
Anesthesia: General | Site: Eye | Laterality: Right

## 2013-08-27 MED ORDER — GLYCOPYRROLATE 0.2 MG/ML IJ SOLN
INTRAMUSCULAR | Status: DC | PRN
  Administered 2013-08-27: 0.4 mg via INTRAVENOUS

## 2013-08-27 MED ORDER — CLINDAMYCIN PHOSPHATE 900 MG/50ML IV SOLN
INTRAVENOUS | Status: AC
  Administered 2013-08-27: 900 mg via INTRAVENOUS
  Filled 2013-08-27: qty 50

## 2013-08-27 MED ORDER — TROPICAMIDE 1 % OP SOLN
1.0000 [drp] | OPHTHALMIC | Status: AC | PRN
  Administered 2013-08-27 (×3): 1 [drp] via OPHTHALMIC
  Filled 2013-08-27 (×2): qty 3

## 2013-08-27 MED ORDER — BACITRACIN-POLYMYXIN B 500-10000 UNIT/GM OP OINT
TOPICAL_OINTMENT | OPHTHALMIC | Status: DC | PRN
  Administered 2013-08-27: 1 via OPHTHALMIC

## 2013-08-27 MED ORDER — SODIUM CHLORIDE 0.9 % IJ SOLN
INTRAMUSCULAR | Status: AC
  Filled 2013-08-27: qty 10

## 2013-08-27 MED ORDER — NEOSTIGMINE METHYLSULFATE 10 MG/10ML IV SOLN
INTRAVENOUS | Status: DC | PRN
  Administered 2013-08-27: 3 mg via INTRAVENOUS

## 2013-08-27 MED ORDER — MEPERIDINE HCL 25 MG/ML IJ SOLN: 6.2500 mg | INTRAMUSCULAR | Status: DC | PRN

## 2013-08-27 MED ORDER — DEXAMETHASONE SODIUM PHOSPHATE 10 MG/ML IJ SOLN
INTRAMUSCULAR | Status: AC
  Filled 2013-08-27: qty 1

## 2013-08-27 MED ORDER — BSS PLUS IO SOLN
INTRAOCULAR | Status: DC | PRN
  Administered 2013-08-27: 11:00:00

## 2013-08-27 MED ORDER — PROPOFOL 10 MG/ML IV BOLUS
INTRAVENOUS | Status: DC | PRN
  Administered 2013-08-27: 100 mg via INTRAVENOUS
  Administered 2013-08-27: 30 mg via INTRAVENOUS

## 2013-08-27 MED ORDER — SODIUM HYALURONATE 10 MG/ML IO SOLN
INTRAOCULAR | Status: AC
  Filled 2013-08-27: qty 0.85

## 2013-08-27 MED ORDER — SODIUM CHLORIDE 0.9 % IJ SOLN
INTRAMUSCULAR | Status: DC | PRN
  Administered 2013-08-27: 11:00:00

## 2013-08-27 MED ORDER — OXYCODONE HCL 5 MG PO TABS
5.0000 mg | ORAL_TABLET | Freq: Once | ORAL | Status: AC | PRN
  Administered 2013-08-27: 5 mg via ORAL

## 2013-08-27 MED ORDER — FENTANYL CITRATE 0.05 MG/ML IJ SOLN
INTRAMUSCULAR | Status: AC
  Filled 2013-08-27: qty 5

## 2013-08-27 MED ORDER — ONDANSETRON HCL 4 MG/2ML IJ SOLN: 4.0000 mg | Freq: Once | INTRAMUSCULAR | Status: DC | PRN

## 2013-08-27 MED ORDER — NEOSTIGMINE METHYLSULFATE 10 MG/10ML IV SOLN
INTRAVENOUS | Status: AC
  Filled 2013-08-27: qty 1

## 2013-08-27 MED ORDER — FENTANYL CITRATE 0.05 MG/ML IJ SOLN
INTRAMUSCULAR | Status: DC | PRN
  Administered 2013-08-27: 150 ug via INTRAVENOUS

## 2013-08-27 MED ORDER — LIDOCAINE HCL 2 % IJ SOLN: INTRAMUSCULAR | Status: DC | PRN

## 2013-08-27 MED ORDER — BACITRACIN-POLYMYXIN B 500-10000 UNIT/GM OP OINT
TOPICAL_OINTMENT | OPHTHALMIC | Status: AC
  Filled 2013-08-27: qty 3.5

## 2013-08-27 MED ORDER — BSS IO SOLN
INTRAOCULAR | Status: DC | PRN
  Administered 2013-08-27: 15 mL via INTRAOCULAR

## 2013-08-27 MED ORDER — ATROPINE SULFATE 1 % OP SOLN
OPHTHALMIC | Status: AC
  Filled 2013-08-27: qty 2

## 2013-08-27 MED ORDER — 0.9 % SODIUM CHLORIDE (POUR BTL) OPTIME
TOPICAL | Status: DC | PRN
  Administered 2013-08-27: 1000 mL

## 2013-08-27 MED ORDER — ROCURONIUM BROMIDE 100 MG/10ML IV SOLN
INTRAVENOUS | Status: DC | PRN
  Administered 2013-08-27: 50 mg via INTRAVENOUS
  Administered 2013-08-27: 10 mg via INTRAVENOUS

## 2013-08-27 MED ORDER — SODIUM HYALURONATE 10 MG/ML IO SOLN
INTRAOCULAR | Status: DC | PRN
  Administered 2013-08-27: 0.85 mL via INTRAOCULAR

## 2013-08-27 MED ORDER — TRIAMCINOLONE ACETONIDE 40 MG/ML IJ SUSP
INTRAMUSCULAR | Status: AC
  Filled 2013-08-27: qty 5

## 2013-08-27 MED ORDER — BUPIVACAINE HCL (PF) 0.75 % IJ SOLN
INTRAMUSCULAR | Status: DC | PRN
  Administered 2013-08-27: 20 mL

## 2013-08-27 MED ORDER — MIDAZOLAM HCL 5 MG/5ML IJ SOLN
INTRAMUSCULAR | Status: DC | PRN
  Administered 2013-08-27: 2 mg via INTRAVENOUS

## 2013-08-27 MED ORDER — GLYCOPYRROLATE 0.2 MG/ML IJ SOLN
INTRAMUSCULAR | Status: AC
  Filled 2013-08-27: qty 2

## 2013-08-27 MED ORDER — SODIUM CHLORIDE 0.9 % IV SOLN
INTRAVENOUS | Status: DC
  Administered 2013-08-27: 09:00:00 via INTRAVENOUS

## 2013-08-27 MED ORDER — MIDAZOLAM HCL 2 MG/2ML IJ SOLN
INTRAMUSCULAR | Status: AC
  Filled 2013-08-27: qty 2

## 2013-08-27 MED ORDER — LIDOCAINE HCL (CARDIAC) 20 MG/ML IV SOLN
INTRAVENOUS | Status: DC | PRN
  Administered 2013-08-27: 100 mg via INTRAVENOUS

## 2013-08-27 MED ORDER — BSS PLUS IO SOLN
INTRAOCULAR | Status: AC
  Filled 2013-08-27: qty 500

## 2013-08-27 MED ORDER — ROCURONIUM BROMIDE 50 MG/5ML IV SOLN
INTRAVENOUS | Status: AC
  Filled 2013-08-27: qty 2

## 2013-08-27 MED ORDER — GATIFLOXACIN 0.5 % OP SOLN
1.0000 [drp] | OPHTHALMIC | Status: AC | PRN
  Administered 2013-08-27 (×3): 1 [drp] via OPHTHALMIC
  Filled 2013-08-27: qty 2.5

## 2013-08-27 MED ORDER — OXYCODONE HCL 5 MG PO TABS
ORAL_TABLET | ORAL | Status: AC
  Filled 2013-08-27: qty 1

## 2013-08-27 MED ORDER — HYDROMORPHONE HCL PF 1 MG/ML IJ SOLN
0.2500 mg | INTRAMUSCULAR | Status: DC | PRN
Start: 2013-08-27 — End: 2013-08-27
  Administered 2013-08-27: 0.5 mg via INTRAVENOUS

## 2013-08-27 MED ORDER — ONDANSETRON HCL 4 MG/2ML IJ SOLN
INTRAMUSCULAR | Status: DC | PRN
  Administered 2013-08-27: 4 mg via INTRAVENOUS

## 2013-08-27 MED ORDER — CYCLOPENTOLATE HCL 1 % OP SOLN
1.0000 [drp] | OPHTHALMIC | Status: AC | PRN
  Administered 2013-08-27 (×3): 1 [drp] via OPHTHALMIC
  Filled 2013-08-27: qty 2

## 2013-08-27 MED ORDER — OXYCODONE HCL 5 MG/5ML PO SOLN: 5.0000 mg | Freq: Once | ORAL | Status: AC | PRN

## 2013-08-27 MED ORDER — TRIAMCINOLONE ACETONIDE 40 MG/ML IJ SUSP
INTRAMUSCULAR | Status: DC | PRN
  Administered 2013-08-27: 40 mg via INTRAMUSCULAR

## 2013-08-27 MED ORDER — DEXAMETHASONE SODIUM PHOSPHATE 10 MG/ML IJ SOLN
INTRAMUSCULAR | Status: DC | PRN
  Administered 2013-08-27: 10 mg

## 2013-08-27 MED ORDER — EPINEPHRINE HCL 1 MG/ML IJ SOLN
INTRAMUSCULAR | Status: AC
  Filled 2013-08-27: qty 1

## 2013-08-27 MED ORDER — HYDROMORPHONE HCL PF 1 MG/ML IJ SOLN
INTRAMUSCULAR | Status: AC
  Filled 2013-08-27: qty 1

## 2013-08-27 MED ORDER — ATROPINE SULFATE 1 % OP SOLN
OPHTHALMIC | Status: DC | PRN
  Administered 2013-08-27: 1 [drp] via OPHTHALMIC

## 2013-08-27 MED ORDER — GENTAMICIN SULFATE 40 MG/ML IJ SOLN
INTRAMUSCULAR | Status: AC
  Filled 2013-08-27: qty 2

## 2013-08-27 MED ORDER — POLYMYXIN B SULFATE 500000 UNITS IJ SOLR
INTRAMUSCULAR | Status: AC
  Filled 2013-08-27: qty 1

## 2013-08-27 MED ORDER — BUPIVACAINE HCL (PF) 0.75 % IJ SOLN
INTRAMUSCULAR | Status: AC
  Filled 2013-08-27: qty 10

## 2013-08-27 MED ORDER — PHENYLEPHRINE HCL 2.5 % OP SOLN
1.0000 [drp] | OPHTHALMIC | Status: DC | PRN
  Administered 2013-08-27 (×2): 1 [drp] via OPHTHALMIC
  Filled 2013-08-27: qty 2

## 2013-08-27 MED ORDER — BSS IO SOLN
INTRAOCULAR | Status: AC
  Filled 2013-08-27: qty 15

## 2013-08-27 MED ORDER — PROPOFOL 10 MG/ML IV BOLUS
INTRAVENOUS | Status: AC
  Filled 2013-08-27: qty 20

## 2013-08-27 MED ORDER — ONDANSETRON HCL 4 MG/2ML IJ SOLN
INTRAMUSCULAR | Status: AC
  Filled 2013-08-27: qty 2

## 2013-08-27 SURGICAL SUPPLY — 71 items
APPLICATOR DR MATTHEWS STRL (MISCELLANEOUS) ×2 IMPLANT
BLADE 10 SAFETY STRL DISP (BLADE) ×2 IMPLANT
BLADE EYE CATARACT 19 1.4 BEAV (BLADE) IMPLANT
BLADE MVR KNIFE 19G (BLADE) IMPLANT
BLADE MVR KNIFE 20G (BLADE) IMPLANT
CANNULA DUAL BORE 23G (CANNULA) IMPLANT
CANNULA VLV SOFT TIP 25GA (OPHTHALMIC) ×2 IMPLANT
CORDS BIPOLAR (ELECTRODE) IMPLANT
COTTONBALL LRG STERILE PKG (GAUZE/BANDAGES/DRESSINGS) ×6 IMPLANT
COVER MAYO STAND STRL (DRAPES) ×2 IMPLANT
DRAPE INCISE 51X51 W/FILM STRL (DRAPES) ×2 IMPLANT
DRAPE OPHTHALMIC 77X100 STRL (CUSTOM PROCEDURE TRAY) ×2 IMPLANT
FILTER BLUE MILLIPORE (MISCELLANEOUS) IMPLANT
FILTER STRAW FLUID ASPIR (MISCELLANEOUS) IMPLANT
FORCEPS ECKARDT ILM 25G SERR (OPHTHALMIC RELATED) IMPLANT
FORCEPS GRIESHABER ILM 25G A (INSTRUMENTS) ×2 IMPLANT
GLOVE BIO SURGEON STRL SZ7 (GLOVE) ×4 IMPLANT
GLOVE BIOGEL PI IND STRL 6.5 (GLOVE) ×2 IMPLANT
GLOVE BIOGEL PI IND STRL 7.0 (GLOVE) ×2 IMPLANT
GLOVE BIOGEL PI INDICATOR 6.5 (GLOVE) ×2
GLOVE BIOGEL PI INDICATOR 7.0 (GLOVE) ×2
GLOVE SS BIOGEL STRL SZ 6.5 (GLOVE) ×1 IMPLANT
GLOVE SS BIOGEL STRL SZ 7 (GLOVE) ×1 IMPLANT
GLOVE SUPERSENSE BIOGEL SZ 6.5 (GLOVE) ×1
GLOVE SUPERSENSE BIOGEL SZ 7 (GLOVE) ×1
GLOVE SURG 8.5 LATEX PF (GLOVE) ×2 IMPLANT
GLOVE SURG SS PI 6.5 STRL IVOR (GLOVE) ×6 IMPLANT
GOWN STRL REUS W/ TWL LRG LVL3 (GOWN DISPOSABLE) ×6 IMPLANT
GOWN STRL REUS W/TWL LRG LVL3 (GOWN DISPOSABLE) ×6
HANDLE PNEUMATIC FOR CONSTEL (OPHTHALMIC) ×2 IMPLANT
KIT BASIN OR (CUSTOM PROCEDURE TRAY) ×2 IMPLANT
KNIFE CRESCENT 2.5 55 ANG (BLADE) IMPLANT
MASK EYE SHIELD (GAUZE/BANDAGES/DRESSINGS) ×2 IMPLANT
MICROPICK 25G (MISCELLANEOUS)
NEEDLE 18GX1X1/2 (RX/OR ONLY) (NEEDLE) ×2 IMPLANT
NEEDLE 25GX 5/8IN NON SAFETY (NEEDLE) ×2 IMPLANT
NEEDLE 27GAX1X1/2 (NEEDLE) IMPLANT
NEEDLE FILTER BLUNT 18X 1/2SAF (NEEDLE) ×1
NEEDLE FILTER BLUNT 18X1 1/2 (NEEDLE) ×1 IMPLANT
NEEDLE HYPO 30X.5 LL (NEEDLE) ×2 IMPLANT
NS IRRIG 1000ML POUR BTL (IV SOLUTION) ×2 IMPLANT
PACK VITRECTOMY CUSTOM (CUSTOM PROCEDURE TRAY) ×2 IMPLANT
PAD ARMBOARD 7.5X6 YLW CONV (MISCELLANEOUS) ×4 IMPLANT
PAK PIK VITRECTOMY CVS 25GA (OPHTHALMIC) ×2 IMPLANT
PENCIL BIPOLAR 25GA STR DISP (OPHTHALMIC RELATED) IMPLANT
PIC ILLUMINATED 25G (OPHTHALMIC) ×2
PICK MICROPICK 25G (MISCELLANEOUS) IMPLANT
PIK ILLUMINATED 25G (OPHTHALMIC) ×1 IMPLANT
PROBE LASER ILLUM FLEX CVD 25G (OPHTHALMIC) ×2 IMPLANT
REPL STRA BRUSH NEEDLE (NEEDLE) IMPLANT
RESERVOIR BACK FLUSH (MISCELLANEOUS) IMPLANT
RETAINER VISCERA MED (MISCELLANEOUS) ×2 IMPLANT
ROLLS DENTAL (MISCELLANEOUS) ×4 IMPLANT
SCISSORS TIP ADVANCED DSP 25GA (INSTRUMENTS) IMPLANT
SCRAPER DIAMOND 25GA (OPHTHALMIC RELATED) ×2 IMPLANT
SCRAPER DIAMOND DUST MEMBRANE (MISCELLANEOUS) IMPLANT
SPONGE SURGIFOAM ABS GEL 12-7 (HEMOSTASIS) ×2 IMPLANT
STOPCOCK 4 WAY LG BORE MALE ST (IV SETS) IMPLANT
SUT CHROMIC 7 0 TG140 8 (SUTURE) IMPLANT
SUT ETHILON 10 0 CS140 6 (SUTURE) IMPLANT
SUT ETHILON 9 0 TG140 8 (SUTURE) IMPLANT
SUT POLY NON ABSORB 10-0 8 STR (SUTURE) IMPLANT
SUT SILK 4 0 RB 1 (SUTURE) IMPLANT
SYR 20CC LL (SYRINGE) ×2 IMPLANT
SYR 5ML LL (SYRINGE) IMPLANT
SYR BULB 3OZ (MISCELLANEOUS) ×2 IMPLANT
SYR TB 1ML LUER SLIP (SYRINGE) ×2 IMPLANT
SYRINGE 10CC LL (SYRINGE) IMPLANT
TOWEL OR 17X24 6PK STRL BLUE (TOWEL DISPOSABLE) ×6 IMPLANT
WATER STERILE IRR 1000ML POUR (IV SOLUTION) ×2 IMPLANT
WIPE INSTRUMENT VISIWIPE 73X73 (MISCELLANEOUS) ×2 IMPLANT

## 2013-08-27 NOTE — H&P (Signed)
I examined the patient today and there is no change in the medical status 

## 2013-08-27 NOTE — Progress Notes (Signed)
Lunch relief by S. Gregson RN 

## 2013-08-27 NOTE — Anesthesia Postprocedure Evaluation (Signed)
Anesthesia Post Note  Patient: Courtney Mcconnell  Procedure(s) Performed: Procedure(s) (LRB): PARS PLANA VITRECTOMY WITH 25 GAUGE (Right) MEMBRANE PEEL (Right) LASER PHOTO ABLATION (Right) AIR/FLUID EXCHANGE (Right)  Anesthesia type: general  Patient location: PACU  Post pain: Pain level controlled  Post assessment: Patient's Cardiovascular Status Stable  Last Vitals:  Filed Vitals:   08/27/13 1345  BP: 141/72  Pulse: 70  Temp: 36.7 C  Resp: 20    Post vital signs: Reviewed and stable  Level of consciousness: sedated  Complications: No apparent anesthesia complications

## 2013-08-27 NOTE — Transfer of Care (Signed)
Immediate Anesthesia Transfer of Care Note  Patient: Courtney Mcconnell  Procedure(s) Performed: Procedure(s) with comments: PARS PLANA VITRECTOMY WITH 25 GAUGE (Right) MEMBRANE PEEL (Right) LASER PHOTO ABLATION (Right) - Headscope laser AND Endolaser AIR/FLUID EXCHANGE (Right)  Patient Location: PACU  Anesthesia Type:General  Level of Consciousness: sedated  Airway & Oxygen Therapy: Patient Spontanous Breathing and Patient connected to nasal cannula oxygen  Post-op Assessment: Report given to PACU RN, Post -op Vital signs reviewed and stable and Patient moving all extremities  Post vital signs: Reviewed and stable  Complications: No apparent anesthesia complications

## 2013-08-27 NOTE — Anesthesia Preprocedure Evaluation (Addendum)
Anesthesia Evaluation  Patient identified by MRN, date of birth, ID band Patient awake    Reviewed: Allergy & Precautions, H&P , NPO status , Patient's Chart, lab work & pertinent test results  Airway Mallampati: I TM Distance: >3 FB Neck ROM: Full    Dental  (+) Edentulous Upper, Edentulous Lower, Dental Advisory Given   Pulmonary asthma , Current Smoker,          Cardiovascular hypertension, Pt. on medications     Neuro/Psych    GI/Hepatic GERD-  Medicated and Controlled,  Endo/Other    Renal/GU      Musculoskeletal   Abdominal   Peds  Hematology   Anesthesia Other Findings   Reproductive/Obstetrics                          Anesthesia Physical Anesthesia Plan  ASA: II  Anesthesia Plan: General   Post-op Pain Management:    Induction: Intravenous  Airway Management Planned: Oral ETT  Additional Equipment: None  Intra-op Plan:   Post-operative Plan: Extubation in OR  Informed Consent: I have reviewed the patients History and Physical, chart, labs and discussed the procedure including the risks, benefits and alternatives for the proposed anesthesia with the patient or authorized representative who has indicated his/her understanding and acceptance.   Dental advisory given  Plan Discussed with: CRNA, Surgeon and Anesthesiologist  Anesthesia Plan Comments:        Anesthesia Quick Evaluation

## 2013-08-27 NOTE — Discharge Summary (Signed)
Discharge summary not needed on [facility] patients per medical records. 

## 2013-08-27 NOTE — Brief Op Note (Signed)
Brief Operative note   Preoperative diagnosis:  pre retinal fibrosis right eye Postoperative diagnosis  Post-Op Diagnosis Codes:    * Macular puckering of retina, right [362.56]  Procedures: Pars plana vitrectomy, membrane peel, laser of retinal break, right eye  Surgeon:  Hayden Pedro, MD...  Assistant:  Deatra Ina, SA  Anesthesia: General  Specimen: none  Estimated blood loss:  1cc  Complications: none  Patient sent to PACU in good condition  Composed by Hayden Pedro MD  Dictation number: [id number]

## 2013-08-28 ENCOUNTER — Encounter (HOSPITAL_COMMUNITY): Payer: Self-pay | Admitting: Ophthalmology

## 2013-08-28 ENCOUNTER — Encounter (INDEPENDENT_AMBULATORY_CARE_PROVIDER_SITE_OTHER): Payer: BC Managed Care – PPO | Admitting: Ophthalmology

## 2013-08-28 DIAGNOSIS — H35379 Puckering of macula, unspecified eye: Secondary | ICD-10-CM

## 2013-08-28 NOTE — Op Note (Signed)
Courtney Mcconnell, MEHRER NO.:  [id number]  MEDICAL RECORD NO.:  [id number]  LOCATION:  [facility]                         FACILITY:  [facility]  PHYSICIAN:  John D. Zigmund Daniel, M.D. DATE OF BIRTH:  [birth year]  DATE OF PROCEDURE:  08/27/2013 DATE OF DISCHARGE:  08/27/2013                              OPERATIVE REPORT   ADMISSION DIAGNOSIS:  Preretinal fibrosis.  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  PROCEDURE:  Laser preretinal photocoagulation for retinal break, pars plana vitrectomy with membrane peel, gas fluid exchange all in the right eye.  SURGEON:  Chrystie Nose. Zigmund Daniel, M.D.  ASSISTANT:  Deatra Ina, SA.  ANESTHESIA:  General.  DETAILS:  Usual prep and drape, the indirect ophthalmoscope laser was moved into place.  Inspection of the periphery showed a horseshoe tear at 10 o'clock.  This was treated with endolaser with the indirect ophthalmoscope laser.  764 burns were placed around the break and in the retinal periphery with a power of 400 mW 1000 microns each and 0.1 seconds each.  Attention was then carried to the pars plana area where 25-gauge trocars were placed at 10 and 2 o'clock, infusion at 8 o'clock. Contact lens ring anchored into place at 6 and 12 o'clock.  Provisc was placed on the corneal surface and the flat contact lens was placed. Pars plana vitrectomy was begun just behind the pseudophakus.  The vitrectomy was carried posteriorly and a large white mass of preretinal fibrosis was encountered extending in the macula and up through the superior arcades.  The lighted pick was used to engage the membrane and a 25-gauge pick was used to engage and lift the membrane.  Automated forceps were used to grasp the membrane and peel it in a circumferential manner, removing multiple layers of preretinal membrane from the macular surface.  Kenalog 0.1 mL was injected into the vitreous cavity,  and additional vitreous was seen at this point.  This was carefully removed under low suction and rapid cutting.  The vitrectomy was carried in the mid periphery and the remainder of this membrane was removed in the mid periphery.  The vitrectomy was carried into the far periphery with the super-wide field BIOM viewing system.  The vitrectomy was carried down to the vitreous base for 360 degrees.  Additional laser was placed through the endolaser.  266 burns were placed with a power of 400 mW 1000 microns each and 0.1 seconds each.  This was located inferiorly and also around the break as well.  Once this was accomplished, all of the Kenalog was removed from the eye.  A 100% gas fluid exchange was performed.  The 25-gauge trocars were removed and the wounds were tested, they were found to be secured.  Polymyxin and gentamicin were irrigated into [place] space.  Atropine solution was applied.  Closing pressure was 10 with a Barraquer tonometer.  Polysporin ophthalmic ointment, a patch and shield were placed.  The patient was awakened and taken to recovery in satisfactory condition.  Complications none.  Duration 1 hour.     Chrystie Nose. Zigmund Daniel, M.D.     JDM/MEDQ  D:  08/27/2013  T:  08/28/2013  Job:  [id number]

## 2013-09-03 ENCOUNTER — Encounter (INDEPENDENT_AMBULATORY_CARE_PROVIDER_SITE_OTHER): Payer: BC Managed Care – PPO | Admitting: Ophthalmology

## 2013-09-03 DIAGNOSIS — H35379 Puckering of macula, unspecified eye: Secondary | ICD-10-CM

## 2013-09-22 ENCOUNTER — Ambulatory Visit: Payer: BC Managed Care – PPO | Admitting: Diagnostic Neuroimaging

## 2013-09-24 ENCOUNTER — Encounter (INDEPENDENT_AMBULATORY_CARE_PROVIDER_SITE_OTHER): Payer: BC Managed Care – PPO | Admitting: Ophthalmology

## 2013-09-24 DIAGNOSIS — H35379 Puckering of macula, unspecified eye: Secondary | ICD-10-CM

## 2013-12-03 ENCOUNTER — Encounter (INDEPENDENT_AMBULATORY_CARE_PROVIDER_SITE_OTHER): Payer: BC Managed Care – PPO | Admitting: Ophthalmology

## 2013-12-08 ENCOUNTER — Encounter: Payer: Self-pay | Admitting: Family Medicine

## 2013-12-08 ENCOUNTER — Ambulatory Visit (INDEPENDENT_AMBULATORY_CARE_PROVIDER_SITE_OTHER): Payer: BC Managed Care – PPO | Admitting: Family Medicine

## 2013-12-08 VITALS — BP 141/70 | HR 68 | Temp 97.5°F | Ht 66.0 in | Wt 186.0 lb

## 2013-12-08 DIAGNOSIS — W57XXXA Bitten or stung by nonvenomous insect and other nonvenomous arthropods, initial encounter: Secondary | ICD-10-CM

## 2013-12-08 DIAGNOSIS — J209 Acute bronchitis, unspecified: Secondary | ICD-10-CM

## 2013-12-08 MED ORDER — HYDROCODONE-HOMATROPINE 5-1.5 MG/5ML PO SYRP: 5.0000 mL | ORAL_SOLUTION | Freq: Three times a day (TID) | ORAL | Status: DC | PRN

## 2013-12-08 MED ORDER — METHYLPREDNISOLONE ACETATE 80 MG/ML IJ SUSP
80.0000 mg | Freq: Once | INTRAMUSCULAR | Status: AC
  Administered 2013-12-08: 80 mg via INTRAMUSCULAR

## 2013-12-08 MED ORDER — AZITHROMYCIN 250 MG PO TABS: ORAL_TABLET | ORAL | Status: DC

## 2013-12-08 NOTE — Progress Notes (Signed)
   Subjective:    Patient ID: Courtney Mcconnell, female    DOB: 06/10/1955, 58 y.o.   MRN: [id number]  HPI This 58 y.o. female presents for evaluation of c/o cough and uri sx's.   Review of Systems    No chest pain, SOB, HA, dizziness, vision change, N/V, diarrhea, constipation, dysuria, urinary urgency or frequency, myalgias, arthralgias or rash.  Objective:   Physical Exam  Vital signs noted  Well developed well nourished female.  HEENT - Head atraumatic Normocephalic                Eyes - PERRLA, Conjuctiva - clear Sclera- Clear EOMI                Ears - EAC's Wnl TM's Wnl Gross Hearing WNL                Nose - Nares patent                 Throat - oropharanx wnl Respiratory - Lungs CTA bilateral Cardiac - RRR S1 and S2 without murmur GI - Abdomen soft Nontender and bowel sounds active x 4 Extremities - No edema. Neuro - Grossly intact.      Assessment & Plan:  Acute bronchitis, unspecified organism - Plan: HYDROcodone-homatropine (HYCODAN) 5-1.5 MG/5ML syrup, methylPREDNISolone acetate (DEPO-MEDROL) injection 80 mg  Lysbeth Penner FNP

## 2013-12-14 ENCOUNTER — Encounter: Payer: Self-pay | Admitting: Family Medicine

## 2013-12-25 ENCOUNTER — Telehealth: Payer: Self-pay | Admitting: Family Medicine

## 2013-12-28 ENCOUNTER — Telehealth: Payer: Self-pay | Admitting: Diagnostic Neuroimaging

## 2013-12-28 NOTE — Telephone Encounter (Signed)
Pt's daughter wants to know if pt can up her dose to 400mg  (phenybarbatol) until Tuesday when she comes in so she does not have another seizure. She takes 300mg  now.  Please call and advise.

## 2014-01-04 NOTE — Telephone Encounter (Signed)
Agree. Will address issues in office visit. -VRP

## 2014-01-04 NOTE — Telephone Encounter (Signed)
Called patient to ask if she could arrive @ 1:45 for her 2pm appt on tomorrow. No answer, will make change.

## 2014-01-05 ENCOUNTER — Encounter: Payer: Self-pay | Admitting: Diagnostic Neuroimaging

## 2014-01-05 ENCOUNTER — Ambulatory Visit (INDEPENDENT_AMBULATORY_CARE_PROVIDER_SITE_OTHER): Payer: BC Managed Care – PPO | Admitting: Diagnostic Neuroimaging

## 2014-01-05 VITALS — BP 143/79 | HR 63 | Ht 65.5 in | Wt 189.0 lb

## 2014-01-05 DIAGNOSIS — G40309 Generalized idiopathic epilepsy and epileptic syndromes, not intractable, without status epilepticus: Secondary | ICD-10-CM

## 2014-01-05 NOTE — Patient Instructions (Signed)
I will check blood testing and then adjust dilantin dosing.

## 2014-01-05 NOTE — Progress Notes (Signed)
[facility]  PATIENT: Courtney Mcconnell DOB: 12-10-1955  REFERRING CLINICIAN:  HISTORY FROM: patient, daughter REASON FOR VISIT: follow up   HISTORICAL  CHIEF COMPLAINT:  Chief Complaint  Patient presents with  . Follow-up    sz    HISTORY OF PRESENT ILLNESS:   UPDATE 01/05/14: Since last visit, has had 4 seizures. 2 seizures last Sunday, separated by 3 hours. Lips turned blue. Patient declined hospital visit. Taking dilantin 300mg  at bedtime. Had URI symptoms the week prior to seizures.   UPDATE 06/29/13: Since last visit, had breakthrough seizure in April 2015 and another one last Thursday (06/25/13). She had been having cough, dx'd with URI and rx'd medrol + amoxicillin. Seizure occurred at 2am, in her sleep. She was found by family in the bathroom, on the floor, convulsing, bruised, eyes barely open. Lasted ~ 10-20 min. Then post ictal snoring 15-20 min, then confusion and wandering around the home for next 1 hour. Admits that she has been missing doses of LEV XR 500mg  daily because of cost ($86/month) and side effects (agitation, mood, depression).   UPDATE 12/11/12: Since last visit patient doing well and having no further seizures. She's tolerating levetiracetam XR 500 mg at bedtime without side effects. Patient is under more stress as her husband has been recently diagnosed with cancer.   PRIOR HPI (09/03/12): 58 year old female here for seizure disorder evaluation. Patient is accompanied by 3 family members. She has no recollection of the 2 episodes. Family member explains that first episode occurred in March 2014, she awoke at 5:00am, convulsions started in bed, husband could not wake her up. Convulsions lasted about 30 minutes, husband put wet rag on her face and when it stopped and she started to wake up. There was no tongue-biting or incontinence of bowel or bladder. They did not seek medical care. Another event happened in the morning on 08/06/12, family  reports that patient got up as usual in the morning, went to the bathroom, went to he kitchen, got a cigarette and sat at the kitchen table. Then she developed staring and then convulsions, lasting about 15 minutes, afterward she was taken to [facility] ED by family. She was started on LEV 250 mg BID at ED discharge, CT head was negative. Patient denies any history of stroke or family history of seizure. Patient reports she is only taking LEV 250 mg at night because taking it twice a day made her feel like "she wasn't in the world". Has had occasional olfactory hallucinations of vinegar, and has had some deja vu episodes. Current every day smoker.    REVIEW OF SYSTEMS: Full 14 system review of systems performed and notable only for seizure.  ALLERGIES: Allergies  Allergen Reactions  . Bee Venom Swelling  . Amoxicillin-Pot Clavulanate Rash  . Doxycycline Hyclate Rash    Rash and itching    HOME MEDICATIONS: Outpatient Prescriptions Prior to Visit  Medication Sig Dispense Refill  . albuterol (PROVENTIL HFA;VENTOLIN HFA) 108 (90 BASE) MCG/ACT inhaler Inhale 2 puffs into the lungs every 6 (six) hours as needed for wheezing or shortness of breath. 1 Inhaler 2  . ALPRAZolam (XANAX) 0.5 MG tablet Take 0.5 mg by mouth 2 (two) times daily as needed for anxiety.    Marland Kitchen omeprazole (PRILOSEC OTC) 20 MG tablet Take 20 mg by mouth every evening.    . phenytoin (DILANTIN) 100 MG ER capsule Take 300 mg by mouth at bedtime.     Marland Kitchen azithromycin (ZITHROMAX)  250 MG tablet Take 2 po first day and then one po qd x 4 days 6 tablet 0  . HYDROcodone-homatropine (HYCODAN) 5-1.5 MG/5ML syrup Take 5 mLs by mouth every 8 (eight) hours as needed for cough. 120 mL 0   No facility-administered medications prior to visit.    PAST MEDICAL HISTORY: Past Medical History  Diagnosis Date  . Hypertension   . Seizure   . Asthma   . Pneumonia   . GERD (gastroesophageal reflux disease)   . Headache(784.0)   . Bursitis of  left hip     right    PAST SURGICAL HISTORY: Past Surgical History  Procedure Laterality Date  . Abdominal hysterectomy    . Cholecystectomy    . Appendectomy    . Cataract extraction Bilateral   . Pars plana vitrectomy Right 08/27/2013    Procedure: PARS PLANA VITRECTOMY WITH 25 GAUGE;  Surgeon: Hayden Pedro, MD;  Location: [facility];  Service: Ophthalmology;  Laterality: Right;  . Membrane peel Right 08/27/2013    Procedure: MEMBRANE PEEL;  Surgeon: Hayden Pedro, MD;  Location: [facility];  Service: Ophthalmology;  Laterality: Right;  . Laser photo ablation Right 08/27/2013    Procedure: LASER PHOTO ABLATION;  Surgeon: Hayden Pedro, MD;  Location: [facility];  Service: Ophthalmology;  Laterality: Right;  Headscope laser AND Endolaser    FAMILY HISTORY: Family History  Problem Relation Age of Onset  . Diabetes Father   . Hypertension Father   . Hyperlipidemia Father     SOCIAL HISTORY:  History   Social History  . Marital Status: Married    Spouse Name: [name]    Number of Children: 2  . Years of Education: GED   Occupational History  . [employer]    Social History Main Topics  . Smoking status: Current Every Day Smoker -- 1.00 packs/day for 42 years    Types: Cigarettes  . Smokeless tobacco: Never Used  . Alcohol Use: No  . Drug Use: No  . Sexual Activity: Not on file   Other Topics Concern  . Not on file   Social History Narrative   Patient lives at home with family.   Caffeine Use: 3 pots of coffee daily     PHYSICAL EXAM  Filed Vitals:   01/05/14 1409  BP: 143/79  Pulse: 63  Height: 5' 5.5" (1.664 m)  Weight: 189 lb (85.73 kg)    Not recorded      Body mass index is 30.96 kg/(m^2).  GENERAL EXAM: Patient is in no distress; well developed, nourished and groomed; neck is supple; SEVERE CIGARETTE SMOKE SMELL IN ROOM ON PATIENT AND DAUGHTER.   CARDIOVASCULAR: Regular rate and rhythm, no murmurs, no carotid bruits  NEUROLOGIC: MENTAL STATUS: awake,  alert, language fluent, comprehension intact, naming intact, fund of knowledge appropriate CRANIAL NERVE: no papilledema on fundoscopic exam, pupils equal and reactive to light, visual fields full to confrontation, extraocular muscles intact, no nystagmus, facial sensation and strength symmetric, hearing intact, palate elevates symmetrically, uvula midline, shoulder shrug symmetric, tongue midline. MOTOR: normal bulk and tone, full strength in the BUE, BLE SENSORY: normal and symmetric to light touch  COORDINATION: finger-nose-finger, fine finger movements normal REFLEXES: deep tendon reflexes present and symmetric GAIT/STATION: narrow based gait; CANNOT TANDEM   DIAGNOSTIC DATA (LABS, IMAGING, TESTING) - I reviewed patient records, labs, notes, testing and imaging myself where available.  Lab Results  Component Value Date   WBC 6.0 08/27/2013   HGB 12.7 08/27/2013  HCT 38.6 08/27/2013   MCV 97.0 08/27/2013   PLT 201 08/27/2013      Component Value Date/Time   NA 143 08/27/2013 0848   K 4.2 08/27/2013 0848   CL 105 08/27/2013 0848   CO2 26 08/27/2013 0848   GLUCOSE 95 08/27/2013 0848   BUN 12 08/27/2013 0848   CREATININE 0.79 08/27/2013 0848   CALCIUM 9.0 08/27/2013 0848   PROT 7.0 08/06/2012 0753   ALBUMIN 3.6 08/06/2012 0753   AST 15 08/06/2012 0753   ALT 7 08/06/2012 0753   ALKPHOS 90 08/06/2012 0753   BILITOT 0.3 08/06/2012 0753   GFRNONAA >90 08/27/2013 0848   GFRAA >90 08/27/2013 0848   No results found for: CHOL No results found for: HGBA1C No results found for: VITAMINB12 No results found for: TSH  08/06/12 CT head - no acute findings   09/17/12 MRI brain - few scattered periventricular and subcortical and pontine chronic small vessel ischemic disease. No acute findings.   09/10/12 EEG - normal   ASSESSMENT AND PLAN  58 y.o. year old female here with with multiple episodes of nocturnal, witnessed convulsions, suspicious for generalized seizure disorder.  Previously couldn't toelrate levetiracetam (cost and side effects). Last event on 01/03/14, possibly triggered by URI.   PLAN:  1. Check labs; adjust phenytoin as needed upward 2. No driving until seizure free x 6 months 3. Cigarette smoking cessation reviewed  Return in about 6 months (around 07/06/2014) for with Cecille Rubin.    Penni Bombard, MD 03/00/9233, 0:07 PM Certified in Neurology, Neurophysiology and Neuroimaging  [facility] [address] [place], [place] [address] [phone]

## 2014-01-06 ENCOUNTER — Telehealth: Payer: Self-pay | Admitting: *Deleted

## 2014-01-06 DIAGNOSIS — Z0289 Encounter for other administrative examinations: Secondary | ICD-10-CM

## 2014-01-06 LAB — CBC WITH DIFFERENTIAL
Basophils Absolute: 0 10*3/uL (ref 0.0–0.2)
Basos: 0 %
EOS: 1 %
Eosinophils Absolute: 0.1 10*3/uL (ref 0.0–0.4)
HCT: 40.1 % (ref 34.0–46.6)
Hemoglobin: 13.3 g/dL (ref 11.1–15.9)
IMMATURE GRANS (ABS): 0 10*3/uL (ref 0.0–0.1)
IMMATURE GRANULOCYTES: 0 %
Lymphocytes Absolute: 2.7 10*3/uL (ref 0.7–3.1)
Lymphs: 39 %
MCH: 32.5 pg (ref 26.6–33.0)
MCHC: 33.2 g/dL (ref 31.5–35.7)
MCV: 98 fL — ABNORMAL HIGH (ref 79–97)
MONOCYTES: 11 %
MONOS ABS: 0.8 10*3/uL (ref 0.1–0.9)
NEUTROS PCT: 49 %
Neutrophils Absolute: 3.4 10*3/uL (ref 1.4–7.0)
Platelets: 219 10*3/uL (ref 150–379)
RBC: 4.09 x10E6/uL (ref 3.77–5.28)
RDW: 13.5 % (ref 12.3–15.4)
WBC: 7.1 10*3/uL (ref 3.4–10.8)

## 2014-01-06 LAB — COMPREHENSIVE METABOLIC PANEL
ALT: 8 IU/L (ref 0–32)
AST: 19 IU/L (ref 0–40)
Albumin/Globulin Ratio: 1.8 (ref 1.1–2.5)
Albumin: 4.2 g/dL (ref 3.5–5.5)
Alkaline Phosphatase: 152 IU/L — ABNORMAL HIGH (ref 39–117)
BUN/Creatinine Ratio: 14 (ref 9–23)
BUN: 10 mg/dL (ref 6–24)
CALCIUM: 9 mg/dL (ref 8.7–10.2)
CO2: 27 mmol/L (ref 18–29)
CREATININE: 0.73 mg/dL (ref 0.57–1.00)
Chloride: 99 mmol/L (ref 97–108)
GFR calc Af Amer: 105 mL/min/{1.73_m2} (ref >59)
GFR calc non Af Amer: 91 mL/min/{1.73_m2} (ref >59)
GLOBULIN, TOTAL: 2.4 g/dL (ref 1.5–4.5)
GLUCOSE: 86 mg/dL (ref 65–99)
Potassium: 4 mmol/L (ref 3.5–5.2)
SODIUM: 139 mmol/L (ref 134–144)
Total Bilirubin: <0.2 mg/dL (ref 0.0–1.2)
Total Protein: 6.6 g/dL (ref 6.0–8.5)

## 2014-01-06 LAB — PHENYTOIN LEVEL, TOTAL: Phenytoin Lvl: 5.4 ug/mL — ABNORMAL LOW (ref 10.0–20.0)

## 2014-01-06 NOTE — Telephone Encounter (Signed)
Form,[employer] to [place] to be completed 01-06-14.

## 2014-01-13 ENCOUNTER — Telehealth: Payer: Self-pay | Admitting: Diagnostic Neuroimaging

## 2014-01-13 NOTE — Telephone Encounter (Signed)
Pt's daughter is calling to get lab results.  Please call and advise.

## 2014-01-13 NOTE — Telephone Encounter (Signed)
I called patient's daughter. No answer. Please advise patient of dilantin level (low at 5.4) so patient should increase dilantin to 400mg  at bedtime.  Penni Bombard, MD 54/05/9199, 0:07 PM Certified in Neurology, Neurophysiology and Neuroimaging  [facility] [address] [place], [place] [address] [phone]

## 2014-01-14 NOTE — Telephone Encounter (Signed)
Called patient. Went straight to vmail.  Vmail has not been set up as of yet.

## 2014-01-15 NOTE — Telephone Encounter (Signed)
Spoke to gentleman who answered at the hp#. He stated patient is at work and he will have her to contact our office when she comes home.

## 2014-01-15 NOTE — Telephone Encounter (Signed)
Placed forms in Dr. Gladstone Lighter in basket in office.

## 2014-01-18 NOTE — Telephone Encounter (Signed)
Patient returning call.

## 2014-01-19 NOTE — Telephone Encounter (Signed)
Gave forms back to medical records.

## 2014-02-01 ENCOUNTER — Other Ambulatory Visit: Payer: Self-pay | Admitting: *Deleted

## 2014-02-01 ENCOUNTER — Telehealth: Payer: Self-pay | Admitting: *Deleted

## 2014-02-01 DIAGNOSIS — R062 Wheezing: Secondary | ICD-10-CM

## 2014-02-01 MED ORDER — ALBUTEROL SULFATE HFA 108 (90 BASE) MCG/ACT IN AERS: 2.0000 | INHALATION_SPRAY | Freq: Four times a day (QID) | RESPIRATORY_TRACT | Status: DC | PRN

## 2014-02-01 NOTE — Telephone Encounter (Signed)
Form,[employer] received,completed by Dr Leta Baptist and Larey Seat faxed 02-01-14.

## 2014-04-18 ENCOUNTER — Emergency Department (HOSPITAL_COMMUNITY): Payer: 59

## 2014-04-18 ENCOUNTER — Encounter (HOSPITAL_COMMUNITY): Payer: Self-pay | Admitting: *Deleted

## 2014-04-18 ENCOUNTER — Observation Stay (HOSPITAL_COMMUNITY)
Admission: EM | Admit: 2014-04-18 | Discharge: 2014-04-20 | Disposition: A | Discharge status: Home / Self Care | Payer: 59 | Attending: Internal Medicine | Admitting: Internal Medicine

## 2014-04-18 DIAGNOSIS — E860 Dehydration: Secondary | ICD-10-CM | POA: Diagnosis present

## 2014-04-18 DIAGNOSIS — I10 Essential (primary) hypertension: Secondary | ICD-10-CM | POA: Diagnosis present

## 2014-04-18 DIAGNOSIS — K219 Gastro-esophageal reflux disease without esophagitis: Secondary | ICD-10-CM | POA: Diagnosis not present

## 2014-04-18 DIAGNOSIS — Z87891 Personal history of nicotine dependence: Secondary | ICD-10-CM | POA: Insufficient documentation

## 2014-04-18 DIAGNOSIS — R569 Unspecified convulsions: Principal | ICD-10-CM

## 2014-04-18 DIAGNOSIS — J189 Pneumonia, unspecified organism: Secondary | ICD-10-CM | POA: Insufficient documentation

## 2014-04-18 DIAGNOSIS — R05 Cough: Secondary | ICD-10-CM

## 2014-04-18 DIAGNOSIS — J45909 Unspecified asthma, uncomplicated: Secondary | ICD-10-CM | POA: Diagnosis not present

## 2014-04-18 DIAGNOSIS — N179 Acute kidney failure, unspecified: Secondary | ICD-10-CM | POA: Diagnosis not present

## 2014-04-18 DIAGNOSIS — E876 Hypokalemia: Secondary | ICD-10-CM | POA: Diagnosis not present

## 2014-04-18 DIAGNOSIS — R059 Cough, unspecified: Secondary | ICD-10-CM

## 2014-04-18 LAB — BASIC METABOLIC PANEL
ANION GAP: 4 — AB (ref 5–15)
BUN: 13 mg/dL (ref 6–23)
CO2: 30 mmol/L (ref 19–32)
CREATININE: 0.82 mg/dL (ref 0.50–1.10)
Calcium: 9 mg/dL (ref 8.4–10.5)
Chloride: 101 mmol/L (ref 96–112)
GFR calc Af Amer: 90 mL/min — ABNORMAL LOW (ref >90)
GFR calc non Af Amer: 77 mL/min — ABNORMAL LOW (ref >90)
Glucose, Bld: 113 mg/dL — ABNORMAL HIGH (ref 70–99)
Potassium: 4.1 mmol/L (ref 3.5–5.1)
SODIUM: 135 mmol/L (ref 135–145)

## 2014-04-18 LAB — URINALYSIS, ROUTINE W REFLEX MICROSCOPIC
Bilirubin Urine: NEGATIVE
GLUCOSE, UA: NEGATIVE mg/dL
Hgb urine dipstick: NEGATIVE
Ketones, ur: NEGATIVE mg/dL
LEUKOCYTES UA: NEGATIVE
NITRITE: NEGATIVE
Protein, ur: NEGATIVE mg/dL
SPECIFIC GRAVITY, URINE: 1.021 (ref 1.005–1.030)
UROBILINOGEN UA: 0.2 mg/dL (ref 0.0–1.0)
pH: 5.5 (ref 5.0–8.0)

## 2014-04-18 LAB — CBC WITH DIFFERENTIAL/PLATELET
BASOS ABS: 0 10*3/uL (ref 0.0–0.1)
Basophils Relative: 0 % (ref 0–1)
EOS PCT: 0 % (ref 0–5)
Eosinophils Absolute: 0 10*3/uL (ref 0.0–0.7)
HCT: 40.7 % (ref 36.0–46.0)
Hemoglobin: 13.5 g/dL (ref 12.0–15.0)
LYMPHS ABS: 1.7 10*3/uL (ref 0.7–4.0)
Lymphocytes Relative: 18 % (ref 12–46)
MCH: 31.7 pg (ref 26.0–34.0)
MCHC: 33.2 g/dL (ref 30.0–36.0)
MCV: 95.5 fL (ref 78.0–100.0)
MONOS PCT: 9 % (ref 3–12)
Monocytes Absolute: 0.8 10*3/uL (ref 0.1–1.0)
NEUTROS PCT: 73 % (ref 43–77)
Neutro Abs: 6.6 10*3/uL (ref 1.7–7.7)
PLATELETS: 179 10*3/uL (ref 150–400)
RBC: 4.26 MIL/uL (ref 3.87–5.11)
RDW: 13.4 % (ref 11.5–15.5)
WBC: 9.1 10*3/uL (ref 4.0–10.5)

## 2014-04-18 LAB — CBG MONITORING, ED: GLUCOSE-CAPILLARY: 89 mg/dL (ref 70–99)

## 2014-04-18 LAB — PHENYTOIN LEVEL, TOTAL: PHENYTOIN LVL: 5.1 ug/mL — AB (ref 10.0–20.0)

## 2014-04-18 MED ORDER — DOCUSATE SODIUM 100 MG PO CAPS
100.0000 mg | ORAL_CAPSULE | Freq: Two times a day (BID) | ORAL | Status: DC
  Administered 2014-04-18 – 2014-04-20 (×4): 100 mg via ORAL
  Filled 2014-04-18 (×4): qty 1

## 2014-04-18 MED ORDER — PANTOPRAZOLE SODIUM 40 MG PO TBEC
40.0000 mg | DELAYED_RELEASE_TABLET | Freq: Every evening | ORAL | Status: DC
  Administered 2014-04-19: 40 mg via ORAL
  Filled 2014-04-18: qty 1

## 2014-04-18 MED ORDER — SODIUM CHLORIDE 0.9 % IV SOLN
INTRAVENOUS | Status: DC
  Administered 2014-04-18 – 2014-04-19 (×2): via INTRAVENOUS

## 2014-04-18 MED ORDER — HEPARIN SODIUM (PORCINE) 5000 UNIT/ML IJ SOLN
5000.0000 [IU] | Freq: Three times a day (TID) | INTRAMUSCULAR | Status: DC
  Administered 2014-04-18 – 2014-04-20 (×5): 5000 [IU] via SUBCUTANEOUS
  Filled 2014-04-18 (×5): qty 1

## 2014-04-18 MED ORDER — ONDANSETRON HCL 4 MG/2ML IJ SOLN: 4.0000 mg | Freq: Four times a day (QID) | INTRAMUSCULAR | Status: DC | PRN

## 2014-04-18 MED ORDER — SODIUM CHLORIDE 0.9 % IV SOLN: 500.0000 mg | Freq: Once | INTRAVENOUS | Status: DC

## 2014-04-18 MED ORDER — ONDANSETRON HCL 4 MG PO TABS: 4.0000 mg | ORAL_TABLET | Freq: Four times a day (QID) | ORAL | Status: DC | PRN

## 2014-04-18 MED ORDER — PHENYTOIN SODIUM 50 MG/ML IJ SOLN
500.0000 mg | Freq: Once | INTRAMUSCULAR | Status: AC
  Administered 2014-04-18: 500 mg via INTRAVENOUS
  Filled 2014-04-18 (×2): qty 10

## 2014-04-18 MED ORDER — ACETAMINOPHEN 325 MG PO TABS
650.0000 mg | ORAL_TABLET | Freq: Four times a day (QID) | ORAL | Status: DC | PRN
  Administered 2014-04-18: 650 mg via ORAL
  Filled 2014-04-18: qty 2

## 2014-04-18 MED ORDER — DOXYCYCLINE HYCLATE 100 MG PO CAPS: 100.0000 mg | ORAL_CAPSULE | Freq: Two times a day (BID) | ORAL | Status: DC

## 2014-04-18 MED ORDER — ALUM & MAG HYDROXIDE-SIMETH 200-200-20 MG/5ML PO SUSP: 30.0000 mL | Freq: Four times a day (QID) | ORAL | Status: DC | PRN

## 2014-04-18 MED ORDER — ALBUTEROL SULFATE (2.5 MG/3ML) 0.083% IN NEBU: 2.5000 mg | INHALATION_SOLUTION | Freq: Four times a day (QID) | RESPIRATORY_TRACT | Status: DC | PRN

## 2014-04-18 MED ORDER — PHENYTOIN SODIUM EXTENDED 100 MG PO CAPS
300.0000 mg | ORAL_CAPSULE | Freq: Every day | ORAL | Status: DC
  Administered 2014-04-18: 300 mg via ORAL
  Filled 2014-04-18: qty 3

## 2014-04-18 MED ORDER — ACETAMINOPHEN 650 MG RE SUPP: 650.0000 mg | Freq: Four times a day (QID) | RECTAL | Status: DC | PRN

## 2014-04-18 MED ORDER — SODIUM CHLORIDE 0.9 % IJ SOLN
3.0000 mL | Freq: Two times a day (BID) | INTRAMUSCULAR | Status: DC
  Administered 2014-04-18 – 2014-04-19 (×2): 3 mL via INTRAVENOUS

## 2014-04-18 MED ORDER — LORAZEPAM 2 MG/ML IJ SOLN
1.0000 mg | Freq: Once | INTRAMUSCULAR | Status: AC
  Administered 2014-04-18: 1 mg via INTRAVENOUS
  Filled 2014-04-18: qty 1

## 2014-04-18 NOTE — ED Notes (Signed)
Pt transported to CT scan.

## 2014-04-18 NOTE — Consult Note (Signed)
Neurology Consultation Reason for Consult: Seizures Referring Physician: Jeanell Sparrow, D  CC: Seizures  History is obtained from: Patient, son  HPI: Courtney Mcconnell is a 59 y.o. female with a history of seizures managed on Dilantin who presents with 4 seizures in the past 24 hours. She continues to be postictal. Her son states that he thinks that she was taking her Dilantin as prescribed, but previous notes raise concerns for noncompliance. She was diagnosed recently with bronchitis and started on doxycycline as well as azithromycin.   She had her first seizure last night around 11 PM and has had 4 seizures since then, most recent around 11 AM. She was given some Ativan and her Dilantin level was found to be low.     ROS: A 14 point ROS was performed and is negative except as noted in the HPI.   Past Medical History  Diagnosis Date  . Hypertension   . Seizure   . Asthma   . Pneumonia   . GERD (gastroesophageal reflux disease)   . Headache(784.0)   . Bursitis of left hip     right    Family History: No history of seizures  Social History: Tob: Quit 1 month ago  Exam: Current vital signs: BP 153/71 mmHg  Pulse 78  Temp(Src) 99.5 F (37.5 C) (Oral)  Resp 12  Ht 5\' 6"  (1.676 m)  Wt 81.647 kg (180 lb)  BMI 29.07 kg/m2  SpO2 97% Vital signs in last 24 hours: Temp:  [99.5 F (37.5 C)] 99.5 F (37.5 C) (03/06 1315) Pulse Rate:  [76-78] 78 (03/06 1439) Resp:  [12-14] 12 (03/06 1439) BP: (139-153)/(67-71) 153/71 mmHg (03/06 1438) SpO2:  [97 %] 97 % (03/06 1439) Weight:  [81.647 kg (180 lb)] 81.647 kg (180 lb) (03/06 1315)   Physical Exam  Constitutional: Appears well-developed and well-nourished.  Psych: Affect appropriate to situation Eyes: No scleral injection HENT: No OP obstrucion Head: Normocephalic.  Cardiovascular: Normal rate and regular rhythm.  Respiratory: Effort normal and breath sounds normal to anterior ascultation GI: Soft.  No distension. There is no  tenderness.  Skin: WDI  Neuro: Mental Status: Patient is lethargic but arousable. She follows commands and answer simple questions including identifying her son. Cranial Nerves: II: Visual Fields are full. Pupils are equal, round, and reactive to light.   III,IV, VI: EOMI without ptosis or diploplia.  V: Facial sensation is symmetric to temperature VII: Facial movement is symmetric.  VIII: hearing is intact to voice X: Uvula elevates symmetrically XI: Shoulder shrug is symmetric. XII: tongue is midline without atrophy or fasciculations.  Motor: Tone is normal. Bulk is normal. 5/5 strength was present in all four extremities.  Sensory: Sensation is symmetric to light touch and temperature in the arms and legs. Cerebellar: FNF with mild difficulty bilaterally     I have reviewed labs in epic and the results pertinent to this consultation are: Phenytoin 5.1  I have reviewed the images obtained: Negative head CT  Impression: 59 year old female with breakthrough seizures in the setting of subtherapeutic Dilantin levels. Once she is more awake and able to answer questions more reliably, I would press her on compliance. If she has been noncompliant, then I would not adjust her medications but if she has been in compliance then I would increase her Dilantin.  Recommendations: 1) Dilantin 300 mg daily at bedtime, starting tonight 2) patient has already had 500 mg load. 3) would give additional benzodiazepine if she has any further seizures.  Roland Rack, MD [facility] [phone]  If 7pm- 7am, please page neurology on call as listed in [place].

## 2014-04-18 NOTE — ED Notes (Signed)
Family reports multiple seizures over the past several days, also states she was recently diagnosed with bronchitis, currently taking antibiotics. No injuries noted. Pt is alert and able to answer questions appropriately at the time. Denies pain.

## 2014-04-18 NOTE — Progress Notes (Signed)
Pharmacist called- pt with allergy to Doxycycline- reaction type unknown. Despite this medication being prescribed prior to admission will dc until pt alert enough to clarify type of reaction. Pt is afebrile and has no leukocytosis. CXR concerning for POSSIBLE pneumonia but she is not hypoxic.  Erin Hearing ANP

## 2014-04-18 NOTE — ED Notes (Signed)
CBG 89 

## 2014-04-18 NOTE — H&P (Signed)
[facility] History and Physical                                                                                    Courtney Mcconnell, is a 59 y.o. female  MRN: [id number]   DOB - 12-Aug-1955  Admit Date - 04/18/2014  Outpatient Primary MD for the patient is Redge Gainer, MD  With History of -  Past Medical History  Diagnosis Date  . Hypertension   . Seizure   . Asthma   . Pneumonia   . GERD (gastroesophageal reflux disease)   . Headache(784.0)   . Bursitis of left hip     right      Past Surgical History  Procedure Laterality Date  . Abdominal hysterectomy    . Cholecystectomy    . Appendectomy    . Cataract extraction Bilateral   . Pars plana vitrectomy Right 08/27/2013    Procedure: PARS PLANA VITRECTOMY WITH 25 GAUGE;  Surgeon: Hayden Pedro, MD;  Location: [place];  Service: Ophthalmology;  Laterality: Right;  . Membrane peel Right 08/27/2013    Procedure: MEMBRANE PEEL;  Surgeon: Hayden Pedro, MD;  Location: [place];  Service: Ophthalmology;  Laterality: Right;  . Laser photo ablation Right 08/27/2013    Procedure: LASER PHOTO ABLATION;  Surgeon: Hayden Pedro, MD;  Location: [place];  Service: Ophthalmology;  Laterality: Right;  Headscope laser AND Endolaser    in for   Chief Complaint  Patient presents with  . Seizures     HPI Courtney Mcconnell  is a 59 y.o. female, history of hypertension, asthma as well as seizure disorder. The patient had been her usual state of health until 3/2 when she developed fevers up to 102 and presented to [facility] for [facility]. A chest x-ray was completed with results unknown but patient was discharged on Vibramycin. She had done well until the evening of 3/5 when she had 4 consecutive witnessed seizures beginning at 37 PM on 3/5. History was obtained from the daughter who reported the patient had seizures at 11 PM last night, 6 AM this morning, 8:30 AM and 12:30 PM today. The daughter was concerned because prior to the last  seizure the patient appeared to have left facial drooping that lasted 5 minutes before she began to have seizure activity. The patient's seizure activity was witnessed and patient did not fall or hit her head. Since having multiple seizures she has had longer than usual post ictal phase with the daughter reporting that typically after seizures her mother will sleep for about 15 minutes and returned to her usual state of activity and alertness.  In the ER patient underwent a CT of the head which was negative. Her glucose was 89. Her BUN was 13 creatinine was 0.82 with slightly diminished GFR concerning for dehydration. Her anion gap was 4. Dilantin level was subtherapeutic at 5.1. Chest x-ray and urinalysis are pending to determine if other provoking factors for seizure activity besides subtherapeutic Dilantin level. Dr. Katherine Roan with neurology has seen the patient and recommended observational stay for 24 hours to monitor for further seizure activity.  Review of Systems   In  addition to the HPI above,  **Patient post ictal and unable to answer questions herself No Fever documented since 3/2-no reports of chills, myalgias or other constitutional symptoms No Headache, changes with Vision or hearing, new weakness, tingling, numbness in any extremity,-seizure activity as reported above No problems swallowing food or Liquids, indigestion/reflux No Chest pain, Cough or Shortness of Breath, palpitations, orthopnea or DOE No Abdominal pain, N/V; no melena or hematochezia, no dark tarry stools, Bowel movements are regular, No dysuria, hematuria or flank pain No new skin rashes, lesions, masses or bruises, No new joints pains-aches No recent weight gain or loss No polyuria, polydypsia or polyphagia,  *A full 10 point Review of Systems was done, except as stated above, all other Review of Systems were negative.  Social History History  Substance Use Topics  . Smoking status: Former Smoker -- 1.00  packs/day for 42 years    Types: Cigarettes    Quit date: 03/20/2014  . Smokeless tobacco: Never Used  . Alcohol Use: No    Family History Family History  Problem Relation Age of Onset  . Diabetes Father   . Hypertension Father   . Hyperlipidemia Father     Prior to Admission medications   Medication Sig Start Date End Date Taking? Authorizing Provider  albuterol (PROVENTIL HFA;VENTOLIN HFA) 108 (90 BASE) MCG/ACT inhaler Inhale 2 puffs into the lungs every 6 (six) hours as needed for wheezing or shortness of breath. 02/01/14  Yes Chipper Herb, MD  doxycycline (VIBRAMYCIN) 100 MG capsule Take 100 mg by mouth 2 (two) times daily.   Yes Historical Provider, MD  omeprazole (PRILOSEC OTC) 20 MG tablet Take 20 mg by mouth every evening.   Yes Historical Provider, MD  phenytoin (DILANTIN) 100 MG ER capsule Take 300 mg by mouth at bedtime.    Yes Historical Provider, MD    Allergies  Allergen Reactions  . Bee Venom Swelling  . Amoxicillin-Pot Clavulanate Rash  . Doxycycline Hyclate Rash    Rash and itching    Physical Exam  Vitals  Blood pressure 153/71, pulse 78, temperature 99.5 F (37.5 C), temperature source Oral, resp. rate 12, height 5\' 6"  (1.676 m), weight 180 lb (81.647 kg), SpO2 97 %.   General:  In no acute distress, is post ictal and unable to participate in history  Psych: Unable to accurately assess since post ictal and unable to remain awake  Neuro:   No apparent focal neurological deficits, examination limited by post ictal state, noted to be spontaneously moving all 4 extremities, no facial drooping noted; patient did open eyes to voice and attempted to communicate but quickly fell back to sleep  ENT:  Ears and Eyes appear Normal, Conjunctivae clear, Moist oral mucosa without erythema or exudates.  Neck:  Supple, No lymphadenopathy appreciated  Respiratory:  Symmetrical chest wall movement, Good air movement bilaterally, CTAB. Room Air  Cardiac:  RRR, No  Murmurs, no LE edema noted, no JVD, No carotid bruits, peripheral pulses palpable at 2+  Abdomen:  Positive bowel sounds, Soft, Non tender, Non distended,  No masses appreciated, no obvious hepatosplenomegaly  Skin:  No Cyanosis, Normal Skin Turgor, No Skin Rash or Bruise.  Extremities: Symmetrical without obvious trauma or injury,  no effusions.  Data Review  CBC  Recent Labs Lab 04/18/14 1353  WBC 9.1  HGB 13.5  HCT 40.7  PLT 179  MCV 95.5  MCH 31.7  MCHC 33.2  RDW 13.4  LYMPHSABS 1.7  MONOABS 0.8  EOSABS  0.0  BASOSABS 0.0    Chemistries   Recent Labs Lab 04/18/14 1353  NA 135  K 4.1  CL 101  CO2 30  GLUCOSE 113*  BUN 13  CREATININE 0.82  CALCIUM 9.0    estimated creatinine clearance is 80.5 mL/min (by C-G formula based on Cr of 0.82).  No results for input(s): TSH, T4TOTAL, T3FREE, THYROIDAB in the last 72 hours.  Invalid input(s): FREET3  Coagulation profile No results for input(s): INR, PROTIME in the last 168 hours.  No results for input(s): DDIMER in the last 72 hours.  Cardiac Enzymes No results for input(s): CKMB, TROPONINI, MYOGLOBIN in the last 168 hours.  Invalid input(s): CK  Invalid input(s): POCBNP  Urinalysis    Component Value Date/Time   COLORURINE YELLOW 08/06/2012 0846   APPEARANCEUR CLEAR 08/06/2012 0846   LABSPEC <1.005* 08/06/2012 0846   PHURINE 6.0 08/06/2012 0846   GLUCOSEU NEGATIVE 08/06/2012 0846   HGBUR NEGATIVE 08/06/2012 0846   BILIRUBINUR NEGATIVE 08/06/2012 0846   KETONESUR NEGATIVE 08/06/2012 0846   PROTEINUR NEGATIVE 08/06/2012 0846   UROBILINOGEN 0.2 08/06/2012 0846   NITRITE NEGATIVE 08/06/2012 0846   LEUKOCYTESUR NEGATIVE 08/06/2012 0846    Imaging results:   Ct Head Wo Contrast  04/18/2014   CLINICAL DATA:  Seizures. Multiple seizures last night. No other history obtainable. Initial encounter.  EXAM: CT HEAD WITHOUT CONTRAST  TECHNIQUE: Contiguous axial images were obtained from the base of the  skull through the vertex without intravenous contrast.  COMPARISON:  08/06/2012.  FINDINGS: No mass lesion, mass effect, midline shift, hydrocephalus, hemorrhage. No territorial ischemia or acute infarction. Paranasal sinuses are within normal limits. Small amount of fluid is present in both mastoid air cells. Intracranial atherosclerosis.  IMPRESSION: Negative CT head.   Electronically Signed   By: Dereck Ligas M.D.   On: 04/18/2014 15:10     EKG: Normal sinus rhythm, incomplete right bundle-branch block, QTC 453 ms   Assessment & Plan  Active Problems:   Seizures -Admit to neuro telemetry/observational status -Likely etiology secondary to subtherapeutic Dilantin level -Patient has been loaded with Dilantin in the ER and pending full recovery from prolonged post ictal phase will continue her usual oral Dilantin this evening -Follow up on chest x-ray and urinalysis -Anticipate discharge in a.m. -Repeat Dilantin level in a.m. -Nothing by mouth until fully recovered from post ictal phase    Acute renal failure/Dehydration -IV fluids -Repeat labs in a.m.    HTN  -Was not on medications prior to admission    Asthma/? Recent bronchitis -Appears compensated -Continue home albuterol MDI -Had fevers on 3/2 up to 102 and was prescribed antibiotics at [facility] ER -We'll continue antibiotics for now and follow up on chest x-ray -Check influenza PCR    DVT Prophylaxis: Subcutaneous heparin  Family Communication: Daughter at bedside    Code Status:  Full code  Condition:  Stable  Time spent in minutes : 60   Gil Ingwersen L. ANP on 04/18/2014 at 4:09 PM  Between 7am to 7pm - Pager - [phone]  After 7pm go to [email or web address] - password [email or web address]  And look for the night coverage person covering me after hours  [facility]

## 2014-04-18 NOTE — ED Notes (Addendum)
Pt brought from home via EMS for increasing seizure activity.  Per daughter, pt normally has 2 seizures per month, approx.  However, since last night pt has had 4 witnessed seizures.  No falls per family.  On 3/02 pt placed on doxy and bromfed for bronchitis.  aO x 4.  CBG per ems was 77.

## 2014-04-18 NOTE — Progress Notes (Signed)
Pt arrived to 4N30. Pt lethargic but responds to voice. Pt stated that she was not in pain. Pt and daughter oriented to room and unit. Call bell in reach and bed alarm on. Will continue to monitor.

## 2014-04-18 NOTE — ED Provider Notes (Signed)
CSN: [id number]     Arrival date & time 04/18/14  1310 History   First MD Initiated Contact with Patient 04/18/14 1312     Chief Complaint  Patient presents with  . Seizures     (Consider location/radiation/quality/duration/timing/severity/associated sxs/prior Treatment) HPI  Level V Caveat- pt is post ictal  PCP: Redge Gainer, MD Blood pressure 153/71, pulse 78, temperature 99.5 F (37.5 C), temperature source Oral, resp. rate 12, height 5\' 6"  (1.676 m), weight 180 lb (81.647 kg), SpO2 97 %.  Pt history reported by her daughter. Presents to [facility] ED via EMS after 4 consecutive seizures, beginning at 11 pm last night. pt is currently asleep in the ED. Daughter states that the pt had seizures at 11pm last night, 6 am, 8:30 am, and 12:30 pm today. The daughter states that before the pts most recent seizure, she had L facial drooping that lasted for 5 minutes before she started convulsing. The daughter states that she and her brother were present during the pts seizures and that pt had no episodes of hitting her head or falling. She reports the pt has never had more than 2 seizures in a row. The daughter expresses concern about her mother's post ictal period, stating that usually her mom sleeps for 15 minutes and then gets up and begins walking around. The daughter states that the past 4 seizures have not followed the usual pattern.     Past Medical History  Diagnosis Date  . Hypertension   . Seizure   . Asthma   . Pneumonia   . GERD (gastroesophageal reflux disease)   . Headache(784.0)   . Bursitis of left hip     right   Past Surgical History  Procedure Laterality Date  . Abdominal hysterectomy    . Cholecystectomy    . Appendectomy    . Cataract extraction Bilateral   . Pars plana vitrectomy Right 08/27/2013    Procedure: PARS PLANA VITRECTOMY WITH 25 GAUGE;  Surgeon: Hayden Pedro, MD;  Location: [place];  Service: Ophthalmology;  Laterality: Right;  . Membrane peel Right  08/27/2013    Procedure: MEMBRANE PEEL;  Surgeon: Hayden Pedro, MD;  Location: [place];  Service: Ophthalmology;  Laterality: Right;  . Laser photo ablation Right 08/27/2013    Procedure: LASER PHOTO ABLATION;  Surgeon: Hayden Pedro, MD;  Location: [place];  Service: Ophthalmology;  Laterality: Right;  Headscope laser AND Endolaser   Family History  Problem Relation Age of Onset  . Diabetes Father   . Hypertension Father   . Hyperlipidemia Father    History  Substance Use Topics  . Smoking status: Former Smoker -- 1.00 packs/day for 42 years    Types: Cigarettes    Quit date: 03/20/2014  . Smokeless tobacco: Never Used  . Alcohol Use: No   OB History    Gravida Para Term Preterm AB TAB SAB Ectopic Multiple Living   3 3 3       3      Review of Systems  10 Systems reviewed and are negative for acute change except as noted in the HPI.    Allergies  Bee venom; Amoxicillin-pot clavulanate; and Doxycycline hyclate  Home Medications   Prior to Admission medications   Medication Sig Start Date End Date Taking? Authorizing Provider  albuterol (PROVENTIL HFA;VENTOLIN HFA) 108 (90 BASE) MCG/ACT inhaler Inhale 2 puffs into the lungs every 6 (six) hours as needed for wheezing or shortness of breath. 02/01/14  Yes Elenore Rota  Jennette Bill, MD  doxycycline (VIBRAMYCIN) 100 MG capsule Take 100 mg by mouth 2 (two) times daily.   Yes Historical Provider, MD  omeprazole (PRILOSEC OTC) 20 MG tablet Take 20 mg by mouth every evening.   Yes Historical Provider, MD  phenytoin (DILANTIN) 100 MG ER capsule Take 300 mg by mouth at bedtime.    Yes Historical Provider, MD   BP 153/71 mmHg  Pulse 78  Temp(Src) 99.5 F (37.5 C) (Oral)  Resp 12  Ht 5\' 6"  (1.676 m)  Wt 180 lb (81.647 kg)  BMI 29.07 kg/m2  SpO2 97% Physical Exam  Constitutional: She appears well-developed and well-nourished. She appears lethargic. No distress.  HENT:  Head: Normocephalic and atraumatic.  No intraoral lesions or signs of  trauma.  Eyes: Pupils are equal, round, and reactive to light.  Neck: Normal range of motion. Neck supple. No spinous process tenderness and no muscular tenderness present.  Cardiovascular: Normal rate and regular rhythm.   Pulmonary/Chest: Effort normal. No respiratory distress. She has no wheezes.  Abdominal: Soft.  Musculoskeletal:  No signs of injury, contusion, laceration  Neurological: She has normal strength. She appears lethargic. No cranial nerve deficit or sensory deficit.  Post-ictal  Skin: Skin is warm and dry.  Nursing note and vitals reviewed.   ED Course  Procedures (including critical care time) Labs Review Labs Reviewed  BASIC METABOLIC PANEL - Abnormal; Notable for the following:    Glucose, Bld 113 (*)    GFR calc non Af Amer 77 (*)    GFR calc Af Amer 90 (*)    Anion gap 4 (*)    All other components within normal limits  PHENYTOIN LEVEL, TOTAL - Abnormal; Notable for the following:    Phenytoin Lvl 5.1 (*)    All other components within normal limits  CBC WITH DIFFERENTIAL/PLATELET  URINALYSIS, ROUTINE W REFLEX MICROSCOPIC  CBG MONITORING, ED    Imaging Review Ct Head Wo Contrast  04/18/2014   CLINICAL DATA:  Seizures. Multiple seizures last night. No other history obtainable. Initial encounter.  EXAM: CT HEAD WITHOUT CONTRAST  TECHNIQUE: Contiguous axial images were obtained from the base of the skull through the vertex without intravenous contrast.  COMPARISON:  08/06/2012.  FINDINGS: No mass lesion, mass effect, midline shift, hydrocephalus, hemorrhage. No territorial ischemia or acute infarction. Paranasal sinuses are within normal limits. Small amount of fluid is present in both mastoid air cells. Intracranial atherosclerosis.  IMPRESSION: Negative CT head.   Electronically Signed   By: Dereck Ligas M.D.   On: 04/18/2014 15:10     EKG Interpretation   Date/Time:  Sunday April 18 2014 13:13:44 EST Ventricular Rate:  78 PR Interval:  174 QRS  Duration: 102 QT Interval:  399 QTC Calculation: 517 R Axis:   -7 Text Interpretation:  Normal sinus rhythm Incomplete right bundle branch  block No significant change since last tracing Confirmed by RAY MD,  Andee Poles [phone]) on 04/18/2014 1:36:40 PM      MDM   Final diagnoses:  Seizure  Cough    Dr. Katherine Roan has seen this patient and recommends holding off on the Dilantin until level comes back. Pt is post-ictal but is not having seizure activity and is oriented and responding to questions appropriately.   3:53 pm She has had a negative Head CT and her BMP/CBC is unremarkable. Chest xray and urinalysis ordered but have not yet returned. Will admit to [facility] patient to have Dilantin load and to monitor  for further seizure activity.  Ebony Hail, NP has agreed to admit patient to [facility]- pt will be put on observation status. Chest xray and urinalysis are still pending. Pt made aware of plan and is agreeable.  Filed Vitals:   04/18/14 1439  BP:   Pulse: 78  Temp:   Resp: [address] PA-C 04/18/14 [place], MD 04/18/14 [phone]

## 2014-04-19 DIAGNOSIS — R05 Cough: Secondary | ICD-10-CM

## 2014-04-19 LAB — CBC
HEMATOCRIT: 38.1 % (ref 36.0–46.0)
Hemoglobin: 12.7 g/dL (ref 12.0–15.0)
MCH: 32.3 pg (ref 26.0–34.0)
MCHC: 33.3 g/dL (ref 30.0–36.0)
MCV: 96.9 fL (ref 78.0–100.0)
Platelets: 178 10*3/uL (ref 150–400)
RBC: 3.93 MIL/uL (ref 3.87–5.11)
RDW: 13.4 % (ref 11.5–15.5)
WBC: 7 10*3/uL (ref 4.0–10.5)

## 2014-04-19 LAB — INFLUENZA PANEL BY PCR (TYPE A & B)
H1N1FLUPCR: NOT DETECTED
INFLBPCR: NEGATIVE
Influenza A By PCR: NEGATIVE

## 2014-04-19 LAB — BASIC METABOLIC PANEL
Anion gap: 7 (ref 5–15)
BUN: 7 mg/dL (ref 6–23)
CALCIUM: 8.3 mg/dL — AB (ref 8.4–10.5)
CO2: 25 mmol/L (ref 19–32)
CREATININE: 0.6 mg/dL (ref 0.50–1.10)
Chloride: 104 mmol/L (ref 96–112)
GLUCOSE: 137 mg/dL — AB (ref 70–99)
Potassium: 3.4 mmol/L — ABNORMAL LOW (ref 3.5–5.1)
Sodium: 136 mmol/L (ref 135–145)

## 2014-04-19 LAB — PHENYTOIN LEVEL, TOTAL: PHENYTOIN LVL: 10.2 ug/mL (ref 10.0–20.0)

## 2014-04-19 MED ORDER — LEVOFLOXACIN 500 MG PO TABS
500.0000 mg | ORAL_TABLET | Freq: Every day | ORAL | Status: DC
  Administered 2014-04-19 – 2014-04-20 (×2): 500 mg via ORAL
  Filled 2014-04-19 (×2): qty 1

## 2014-04-19 MED ORDER — LEVOFLOXACIN 500 MG PO TABS: 500.0000 mg | ORAL_TABLET | Freq: Every day | ORAL | Status: AC

## 2014-04-19 MED ORDER — POTASSIUM CHLORIDE CRYS ER 20 MEQ PO TBCR
40.0000 meq | EXTENDED_RELEASE_TABLET | Freq: Once | ORAL | Status: AC
  Administered 2014-04-19: 40 meq via ORAL
  Filled 2014-04-19: qty 2

## 2014-04-19 MED ORDER — PHENYTOIN SODIUM EXTENDED 100 MG PO CAPS: 300.0000 mg | ORAL_CAPSULE | Freq: Every day | ORAL | Status: DC

## 2014-04-19 MED ORDER — SODIUM CHLORIDE 0.9 % IV SOLN
200.0000 mg | INTRAVENOUS | Status: AC
  Administered 2014-04-19: 200 mg via INTRAVENOUS
  Filled 2014-04-19: qty 20

## 2014-04-19 MED ORDER — LACOSAMIDE 50 MG PO TABS
100.0000 mg | ORAL_TABLET | Freq: Two times a day (BID) | ORAL | Status: DC
  Administered 2014-04-19 – 2014-04-20 (×2): 100 mg via ORAL
  Filled 2014-04-19 (×2): qty 2

## 2014-04-19 MED ORDER — PHENYTOIN SODIUM EXTENDED 100 MG PO CAPS: 100.0000 mg | ORAL_CAPSULE | Freq: Every morning | ORAL | Status: DC

## 2014-04-19 NOTE — Progress Notes (Signed)
[facility] PROGRESS NOTE  Courtney Mcconnell IPJ:[id number] DOB: 01/19/1956 DOA: 04/18/2014 PCP: Redge Gainer, MD  Assessment/Plan: Active Problems:   Seizures   Acute renal failure   Dehydration   HTN (hypertension)   Asthma    Seizures Continue neuro telemetry/observational status -Likely etiology secondary to subtherapeutic Dilantin level -Patient has been loaded with Dilantin in the ER and pending full recovery from prolonged post ictal phase seen by neurology, Dilantin discontinued, patient switched to vimpa loading dose  Pneumonia/community acquired  patient started on levofloxacin, no respiratory compromise     Acute renal failure/Dehydration/Hypokalemia DC IV fluids Replete potassium   HTN  -Was not on medications prior to admission When necessary hydralazine   Asthma/? Recent bronchitis -Appears compensated -Continue home albuterol MDI -Had fevers on 3/2 up to 102 and was prescribed antibiotics at [facility] ER Started on Levaquin  negative influenza  PCR   Code Status: full Family Communication: family updated about patient's clinical progress Disposition Plan:  As above    Brief narrative: Courtney Mcconnell is a 59 y.o. female with a history of seizures managed on Dilantin who presents with 4 seizures in the past 24 hours. She continues to be postictal. Her son states that he thinks that she was taking her Dilantin as prescribed, but previous notes raise concerns for noncompliance. She was diagnosed recently with bronchitis and started on doxycycline as well as azithromycin.   She had her first seizure last night around 11 PM and has had 4 seizures since then, most recent around 11 AM. She was given some Ativan and her Dilantin level was found to be low  Consultants:  Neurology  Procedures:  None  Antibiotics: Levaquin:@  HPI/Subjective: Patient states that she has been compliant with her antiseizure medications Daughter states that the patient  has had breakthrough seizures on multiple occasions  Objective: Filed Vitals:   04/18/14 2122 04/19/14 0131 04/19/14 0525 04/19/14 1016  BP: 138/70 119/77 142/59 123/70  Pulse: 70 72 73 69  Temp: 99 F (37.2 C) 98 F (36.7 C) 98.4 F (36.9 C) 98.6 F (37 C)  TempSrc: Oral Oral Oral Oral  Resp: 18 18 18 18   Height:      Weight:      SpO2: 98% 96% 99% 100%   No intake or output data in the 24 hours ending 04/19/14 1252  Exam:  General: No acute respiratory distress Lungs: Clear to auscultation bilaterally without wheezes or crackles Cardiovascular: Regular rate and rhythm without murmur gallop or rub normal S1 and S2 Abdomen: Nontender, nondistended, soft, bowel sounds positive, no rebound, no ascites, no appreciable mass Extremities: No significant cyanosis, clubbing, or edema bilateral lower extremities      Data Reviewed: Basic Metabolic Panel:  Recent Labs Lab 04/18/14 1353 04/19/14 0710  NA 135 136  K 4.1 3.4*  CL 101 104  CO2 30 25  GLUCOSE 113* 137*  BUN 13 7  CREATININE 0.82 0.60  CALCIUM 9.0 8.3*    Liver Function Tests: No results for input(s): AST, ALT, ALKPHOS, BILITOT, PROT, ALBUMIN in the last 168 hours. No results for input(s): LIPASE, AMYLASE in the last 168 hours. No results for input(s): AMMONIA in the last 168 hours.  CBC:  Recent Labs Lab 04/18/14 1353 04/19/14 0710  WBC 9.1 7.0  NEUTROABS 6.6  --   HGB 13.5 12.7  HCT 40.7 38.1  MCV 95.5 96.9  PLT 179 178    Cardiac Enzymes: No results for input(s): CKTOTAL, CKMB, CKMBINDEX,  TROPONINI in the last 168 hours. BNP (last 3 results) No results for input(s): BNP in the last 8760 hours.  ProBNP (last 3 results) No results for input(s): PROBNP in the last 8760 hours.    CBG:  Recent Labs Lab 04/18/14 1414  GLUCAP 89    No results found for this or any previous visit (from the past 240 hour(s)).   Studies: Dg Chest 2 View  04/18/2014   CLINICAL DATA:  Cough for 2 days.   EXAM: CHEST  2 VIEW  COMPARISON:  08/27/2013  FINDINGS: Cardiomegaly again identified.  Patchy posterior lower lobe opacities on lateral view or suspicious for pneumonia.  There is no evidence of pulmonary edema, suspicious pulmonary nodule/mass, pleural effusion, or pneumothorax.  No acute bony abnormalities are identified.  IMPRESSION: Patchy posterior lower lobe opacities on the lateral view -suspicious for pneumonia.  Cardiomegaly.   Electronically Signed   By: Margarette Canada M.D.   On: 04/18/2014 17:04   Ct Head Wo Contrast  04/18/2014   CLINICAL DATA:  Seizures. Multiple seizures last night. No other history obtainable. Initial encounter.  EXAM: CT HEAD WITHOUT CONTRAST  TECHNIQUE: Contiguous axial images were obtained from the base of the skull through the vertex without intravenous contrast.  COMPARISON:  08/06/2012.  FINDINGS: No mass lesion, mass effect, midline shift, hydrocephalus, hemorrhage. No territorial ischemia or acute infarction. Paranasal sinuses are within normal limits. Small amount of fluid is present in both mastoid air cells. Intracranial atherosclerosis.  IMPRESSION: Negative CT head.   Electronically Signed   By: Dereck Ligas M.D.   On: 04/18/2014 15:10    Scheduled Meds: . docusate sodium  100 mg Oral BID  . heparin  5,000 Units Subcutaneous 3 times per day  . lacosamide (VIMPAT) IV  200 mg Intravenous STAT  . lacosamide  100 mg Oral BID  . levofloxacin  500 mg Oral Daily  . pantoprazole  40 mg Oral QPM  . potassium chloride  40 mEq Oral Once  . sodium chloride  3 mL Intravenous Q12H   Continuous Infusions: . sodium chloride 100 mL/hr at 04/19/14 0346    Active Problems:   Seizures   Acute renal failure   Dehydration   HTN (hypertension)   Asthma    Time spent: 40 minutes   [facility] Pager [phone]. If 7PM-7AM, please contact night-coverage at [email or web address], password [facility] 04/19/2014, 12:52 PM

## 2014-04-19 NOTE — Progress Notes (Signed)
UR completed 

## 2014-04-19 NOTE — Progress Notes (Signed)
Subjective: patient has had no further seizures.   Objective: Current vital signs: BP 123/70 mmHg  Pulse 69  Temp(Src) 98.6 F (37 C) (Oral)  Resp 18  Ht 5\' 6"  (1.676 m)  Wt 81.647 kg (180 lb)  BMI 29.07 kg/m2  SpO2 100% Vital signs in last 24 hours: Temp:  [98 F (36.7 C)-99.5 F (37.5 C)] 98.6 F (37 C) (03/07 1016) Pulse Rate:  [69-83] 69 (03/07 1016) Resp:  [12-18] 18 (03/07 1016) BP: (119-153)/(59-77) 123/70 mmHg (03/07 1016) SpO2:  [96 %-100 %] 100 % (03/07 1016) Weight:  [81.647 kg (180 lb)] 81.647 kg (180 lb) (03/06 1315)  Intake/Output from previous day:   Intake/Output this shift:   Nutritional status: Diet NPO time specified Diet - low sodium heart healthy  Neurologic Exam: General: Mental Status: Alert, oriented, thought content appropriate.  Speech fluent without evidence of aphasia.  Able to follow 3 step commands without difficulty. Cranial Nerves: II: Discs flat bilaterally; Visual fields grossly normal, pupils equal, round, reactive to light and accommodation III,IV, VI: ptosis not present, extra-ocular motions intact bilaterally V,VII: smile symmetric, facial light touch sensation normal bilaterally VIII: hearing normal bilaterally IX,X: uvula rises symmetrically XI: bilateral shoulder shrug XII: midline tongue extension without atrophy or fasciculations  Motor: Right : Upper extremity   5/5    Left:     Upper extremity   5/5  Lower extremity   5/5     Lower extremity   5/5 Tone and bulk:normal tone throughout; no atrophy noted Sensory: Pinprick and light touch intact throughout, bilaterally Deep Tendon Reflexes:  Right: Upper Extremity   Left: Upper extremity   biceps (C-5 to C-6) 2/4   biceps (C-5 to C-6) 2/4 tricep (C7) 2/4    triceps (C7) 2/4 Brachioradialis (C6) 2/4  Brachioradialis (C6) 2/4  Lower Extremity Lower Extremity  quadriceps (L-2 to L-4) 2/4   quadriceps (L-2 to L-4) 2/4 Achilles (S1) 2/4   Achilles (S1)  2/4  Plantars: Right: downgoing   Left: downgoing Cerebellar: normal finger-to-nose,  normal heel-to-shin test    Lab Results: Basic Metabolic Panel:  Recent Labs Lab 04/18/14 1353 04/19/14 0710  NA 135 136  K 4.1 3.4*  CL 101 104  CO2 30 25  GLUCOSE 113* 137*  BUN 13 7  CREATININE 0.82 0.60  CALCIUM 9.0 8.3*    Liver Function Tests: No results for input(s): AST, ALT, ALKPHOS, BILITOT, PROT, ALBUMIN in the last 168 hours. No results for input(s): LIPASE, AMYLASE in the last 168 hours. No results for input(s): AMMONIA in the last 168 hours.  CBC:  Recent Labs Lab 04/18/14 1353 04/19/14 0710  WBC 9.1 7.0  NEUTROABS 6.6  --   HGB 13.5 12.7  HCT 40.7 38.1  MCV 95.5 96.9  PLT 179 178    Cardiac Enzymes: No results for input(s): CKTOTAL, CKMB, CKMBINDEX, TROPONINI in the last 168 hours.  Lipid Panel: No results for input(s): CHOL, TRIG, HDL, CHOLHDL, VLDL, LDLCALC in the last 168 hours.  CBG:  Recent Labs Lab 04/18/14 9326  [id number] 09    Microbiology: No results found for this or any previous visit.  Coagulation Studies: No results for input(s): LABPROT, INR in the last 72 hours.  Imaging: Dg Chest 2 View  04/18/2014   CLINICAL DATA:  Cough for 2 days.  EXAM: CHEST  2 VIEW  COMPARISON:  08/27/2013  FINDINGS: Cardiomegaly again identified.  Patchy posterior lower lobe opacities on lateral view or suspicious for pneumonia.  There  is no evidence of pulmonary edema, suspicious pulmonary nodule/mass, pleural effusion, or pneumothorax.  No acute bony abnormalities are identified.  IMPRESSION: Patchy posterior lower lobe opacities on the lateral view -suspicious for pneumonia.  Cardiomegaly.   Electronically Signed   By: Margarette Canada M.D.   On: 04/18/2014 17:04   Ct Head Wo Contrast  04/18/2014   CLINICAL DATA:  Seizures. Multiple seizures last night. No other history obtainable. Initial encounter.  EXAM: CT HEAD WITHOUT CONTRAST  TECHNIQUE: Contiguous axial  images were obtained from the base of the skull through the vertex without intravenous contrast.  COMPARISON:  08/06/2012.  FINDINGS: No mass lesion, mass effect, midline shift, hydrocephalus, hemorrhage. No territorial ischemia or acute infarction. Paranasal sinuses are within normal limits. Small amount of fluid is present in both mastoid air cells. Intracranial atherosclerosis.  IMPRESSION: Negative CT head.   Electronically Signed   By: Dereck Ligas M.D.   On: 04/18/2014 15:10    Medications:  Scheduled: . docusate sodium  100 mg Oral BID  . heparin  5,000 Units Subcutaneous 3 times per day  . levofloxacin  500 mg Oral Daily  . pantoprazole  40 mg Oral QPM  . phenytoin  300 mg Oral QHS  . potassium chloride  40 mEq Oral Once  . sodium chloride  3 mL Intravenous Q12H    Assessment/Plan: 59 year old female with breakthrough seizures in the setting of subtherapeutic Dilantin levels. Patient states she has been compliant with Dilantin and is unsure why her level is not remaining therapeutic. Looking through the notes she has been on Keppra in the past and felt "not in this world".  Will change her to Vimpat.  Will load her with 200 mg now and then start 100 mg BID.  Recommend: 1) Vimpat load 200 mg now 2) Continue Vimpat 100 mg BID 3) Follow up with Dr. Leta Baptist in 2-3 weeks as out patient.    Etta Quill PA-C [facility] [phone]  04/19/2014, 10:22 AM

## 2014-04-20 LAB — CBC
HEMATOCRIT: 37.2 % (ref 36.0–46.0)
HEMOGLOBIN: 12 g/dL (ref 12.0–15.0)
MCH: 32 pg (ref 26.0–34.0)
MCHC: 32.3 g/dL (ref 30.0–36.0)
MCV: 99.2 fL (ref 78.0–100.0)
Platelets: 159 10*3/uL (ref 150–400)
RBC: 3.75 MIL/uL — ABNORMAL LOW (ref 3.87–5.11)
RDW: 13.6 % (ref 11.5–15.5)
WBC: 5.5 10*3/uL (ref 4.0–10.5)

## 2014-04-20 LAB — COMPREHENSIVE METABOLIC PANEL
ALBUMIN: 3.1 g/dL — AB (ref 3.5–5.2)
ALK PHOS: 124 U/L — AB (ref 39–117)
ALT: 9 U/L (ref 0–35)
ANION GAP: 6 (ref 5–15)
AST: 17 U/L (ref 0–37)
BILIRUBIN TOTAL: 0.3 mg/dL (ref 0.3–1.2)
BUN: 8 mg/dL (ref 6–23)
CHLORIDE: 107 mmol/L (ref 96–112)
CO2: 28 mmol/L (ref 19–32)
Calcium: 9 mg/dL (ref 8.4–10.5)
Creatinine, Ser: 0.65 mg/dL (ref 0.50–1.10)
GFR calc Af Amer: >90 mL/min (ref >90)
GFR calc non Af Amer: >90 mL/min (ref >90)
Glucose, Bld: 128 mg/dL — ABNORMAL HIGH (ref 70–99)
POTASSIUM: 3.9 mmol/L (ref 3.5–5.1)
Sodium: 141 mmol/L (ref 135–145)
Total Protein: 5.9 g/dL — ABNORMAL LOW (ref 6.0–8.3)

## 2014-04-20 MED ORDER — LACOSAMIDE 100 MG PO TABS: 100.0000 mg | ORAL_TABLET | Freq: Two times a day (BID) | ORAL | Status: DC

## 2014-04-20 NOTE — Discharge Summary (Signed)
Physician Discharge Summary  Courtney Mcconnell MRN: [id number] DOB/AGE: April 19, 1955 59 y.o.  PCP: Redge Gainer, MD   Admit date: 04/18/2014 Discharge date: 04/20/2014  Discharge Diagnoses:    Active Problems:   Seizures   Acute renal failure   Dehydration   HTN (hypertension)   Asthma    Follow-up recommendations In follow-up with PCP in 5-7 days Follow up with primary neurologist Dr. Leta Baptist in 3 weeks as out patient    Medication List    STOP taking these medications        doxycycline 100 MG capsule  Commonly known as:  VIBRAMYCIN     phenytoin 100 MG ER capsule  Commonly known as:  DILANTIN      TAKE these medications        albuterol 108 (90 BASE) MCG/ACT inhaler  Commonly known as:  PROVENTIL HFA;VENTOLIN HFA  Inhale 2 puffs into the lungs every 6 (six) hours as needed for wheezing or shortness of breath.     Lacosamide 100 MG Tabs  Take 1 tablet (100 mg total) by mouth 2 (two) times daily.     levofloxacin 500 MG tablet  Commonly known as:  LEVAQUIN  Take 1 tablet (500 mg total) by mouth daily.     omeprazole 20 MG tablet  Commonly known as:  PRILOSEC OTC  Take 20 mg by mouth every evening.        Discharge Condition: Stable   Disposition: 01-Home or Self Care   Consults:  Neurology    Significant Diagnostic Studies: Dg Chest 2 View  04/18/2014   CLINICAL DATA:  Cough for 2 days.  EXAM: CHEST  2 VIEW  COMPARISON:  08/27/2013  FINDINGS: Cardiomegaly again identified.  Patchy posterior lower lobe opacities on lateral view or suspicious for pneumonia.  There is no evidence of pulmonary edema, suspicious pulmonary nodule/mass, pleural effusion, or pneumothorax.  No acute bony abnormalities are identified.  IMPRESSION: Patchy posterior lower lobe opacities on the lateral view -suspicious for pneumonia.  Cardiomegaly.   Electronically Signed   By: Margarette Canada M.D.   On: 04/18/2014 17:04   Ct Head Wo Contrast  04/18/2014   CLINICAL DATA:   Seizures. Multiple seizures last night. No other history obtainable. Initial encounter.  EXAM: CT HEAD WITHOUT CONTRAST  TECHNIQUE: Contiguous axial images were obtained from the base of the skull through the vertex without intravenous contrast.  COMPARISON:  08/06/2012.  FINDINGS: No mass lesion, mass effect, midline shift, hydrocephalus, hemorrhage. No territorial ischemia or acute infarction. Paranasal sinuses are within normal limits. Small amount of fluid is present in both mastoid air cells. Intracranial atherosclerosis.  IMPRESSION: Negative CT head.   Electronically Signed   By: Dereck Ligas M.D.   On: 04/18/2014 15:10     Microbiology: No results found for this or any previous visit (from the past 240 hour(s)).   Labs: Results for orders placed or performed during the hospital encounter of 04/18/14 (from the past 48 hour(s))  Basic metabolic panel (if new onset seizures)     Status: Abnormal   Collection Time: 04/18/14  1:53 PM  Result Value Ref Range   Sodium 135 135 - 145 mmol/L   Potassium 4.1 3.5 - 5.1 mmol/L   Chloride 101 96 - 112 mmol/L   CO2 30 19 - 32 mmol/L   Glucose, Bld 113 (H) 70 - 99 mg/dL   BUN 13 6 - 23 mg/dL   Creatinine, Ser 0.82 0.50 - 1.10 mg/dL  Calcium 9.0 8.4 - 10.5 mg/dL   GFR calc non Af Amer 77 (L) >90 mL/min   GFR calc Af Amer 90 (L) >90 mL/min    Comment: (NOTE) The eGFR has been calculated using the CKD EPI equation. This calculation has not been validated in all clinical situations. eGFR's persistently <90 mL/min signify possible Chronic Kidney Disease.    Anion gap 4 (L) 5 - 15  Dilantin (phenytoin) Level   (if pt is taking this medication)     Status: Abnormal   Collection Time: 04/18/14  1:53 PM  Result Value Ref Range   Phenytoin Lvl 5.1 (L) 10.0 - 20.0 ug/mL  CBC with Differential/Platelet     Status: None   Collection Time: 04/18/14  1:53 PM  Result Value Ref Range   WBC 9.1 4.0 - 10.5 K/uL   RBC 4.26 3.87 - 5.11 MIL/uL    Hemoglobin 13.5 12.0 - 15.0 g/dL   HCT 40.7 36.0 - 46.0 %   MCV 95.5 78.0 - 100.0 fL   MCH 31.7 26.0 - 34.0 pg   MCHC 33.2 30.0 - 36.0 g/dL   RDW 13.4 11.5 - 15.5 %   Platelets 179 150 - 400 K/uL   Neutrophils Relative % 73 43 - 77 %   Neutro Abs 6.6 1.7 - 7.7 K/uL   Lymphocytes Relative 18 12 - 46 %   Lymphs Abs 1.7 0.7 - 4.0 K/uL   Monocytes Relative 9 3 - 12 %   Monocytes Absolute 0.8 0.1 - 1.0 K/uL   Eosinophils Relative 0 0 - 5 %   Eosinophils Absolute 0.0 0.0 - 0.7 K/uL   Basophils Relative 0 0 - 1 %   Basophils Absolute 0.0 0.0 - 0.1 K/uL  CBG monitoring, ED     Status: None   Collection Time: 04/18/14  2:14 PM  Result Value Ref Range   Glucose-Capillary 89 70 - 99 mg/dL   Comment 1 Documented in Char   Urinalysis, Routine w reflex microscopic     Status: None   Collection Time: 04/18/14  5:12 PM  Result Value Ref Range   Color, Urine YELLOW YELLOW   APPearance CLEAR CLEAR   Specific Gravity, Urine 1.021 1.005 - 1.030   pH 5.5 5.0 - 8.0   Glucose, UA NEGATIVE NEGATIVE mg/dL   Hgb urine dipstick NEGATIVE NEGATIVE   Bilirubin Urine NEGATIVE NEGATIVE   Ketones, ur NEGATIVE NEGATIVE mg/dL   Protein, ur NEGATIVE NEGATIVE mg/dL   Urobilinogen, UA 0.2 0.0 - 1.0 mg/dL   Nitrite NEGATIVE NEGATIVE   Leukocytes, UA NEGATIVE NEGATIVE    Comment: MICROSCOPIC NOT DONE ON URINES WITH NEGATIVE PROTEIN, BLOOD, LEUKOCYTES, NITRITE, OR GLUCOSE <1000 mg/dL.  Influenza panel by PCR (type A & B, H1N1)     Status: None   Collection Time: 04/18/14  7:00 PM  Result Value Ref Range   Influenza A By PCR NEGATIVE NEGATIVE   Influenza B By PCR NEGATIVE NEGATIVE   H1N1 flu by pcr NOT DETECTED NOT DETECTED    Comment:        The Xpert Flu assay (FDA approved for nasal aspirates or washes and nasopharyngeal swab specimens), is intended as an aid in the diagnosis of influenza and should not be used as a sole basis for treatment.   Basic metabolic panel     Status: Abnormal   Collection  Time: 04/19/14  7:10 AM  Result Value Ref Range   Sodium 136 135 - 145 mmol/L  Potassium 3.4 (L) 3.5 - 5.1 mmol/L   Chloride 104 96 - 112 mmol/L   CO2 25 19 - 32 mmol/L   Glucose, Bld 137 (H) 70 - 99 mg/dL   BUN 7 6 - 23 mg/dL   Creatinine, Ser 0.60 0.50 - 1.10 mg/dL   Calcium 8.3 (L) 8.4 - 10.5 mg/dL   GFR calc non Af Amer >90 >90 mL/min   GFR calc Af Amer >90 >90 mL/min    Comment: (NOTE) The eGFR has been calculated using the CKD EPI equation. This calculation has not been validated in all clinical situations. eGFR's persistently <90 mL/min signify possible Chronic Kidney Disease.    Anion gap 7 5 - 15  CBC     Status: None   Collection Time: 04/19/14  7:10 AM  Result Value Ref Range   WBC 7.0 4.0 - 10.5 K/uL   RBC 3.93 3.87 - 5.11 MIL/uL   Hemoglobin 12.7 12.0 - 15.0 g/dL   HCT 38.1 36.0 - 46.0 %   MCV 96.9 78.0 - 100.0 fL   MCH 32.3 26.0 - 34.0 pg   MCHC 33.3 30.0 - 36.0 g/dL   RDW 13.4 11.5 - 15.5 %   Platelets 178 150 - 400 K/uL  Phenytoin level, total     Status: None   Collection Time: 04/19/14  7:10 AM  Result Value Ref Range   Phenytoin Lvl 10.2 10.0 - 20.0 ug/mL  Comprehensive metabolic panel     Status: Abnormal   Collection Time: 04/20/14  6:31 AM  Result Value Ref Range   Sodium 141 135 - 145 mmol/L   Potassium 3.9 3.5 - 5.1 mmol/L   Chloride 107 96 - 112 mmol/L   CO2 28 19 - 32 mmol/L   Glucose, Bld 128 (H) 70 - 99 mg/dL   BUN 8 6 - 23 mg/dL   Creatinine, Ser 0.65 0.50 - 1.10 mg/dL   Calcium 9.0 8.4 - 10.5 mg/dL   Total Protein 5.9 (L) 6.0 - 8.3 g/dL   Albumin 3.1 (L) 3.5 - 5.2 g/dL   AST 17 0 - 37 U/L   ALT 9 0 - 35 U/L   Alkaline Phosphatase 124 (H) 39 - 117 U/L   Total Bilirubin 0.3 0.3 - 1.2 mg/dL   GFR calc non Af Amer >90 >90 mL/min   GFR calc Af Amer >90 >90 mL/min    Comment: (NOTE) The eGFR has been calculated using the CKD EPI equation. This calculation has not been validated in all clinical situations. eGFR's persistently <90 mL/min  signify possible Chronic Kidney Disease.    Anion gap 6 5 - 15  CBC     Status: Abnormal   Collection Time: 04/20/14  6:31 AM  Result Value Ref Range   WBC 5.5 4.0 - 10.5 K/uL   RBC 3.75 (L) 3.87 - 5.11 MIL/uL   Hemoglobin 12.0 12.0 - 15.0 g/dL   HCT 37.2 36.0 - 46.0 %   MCV 99.2 78.0 - 100.0 fL   MCH 32.0 26.0 - 34.0 pg   MCHC 32.3 30.0 - 36.0 g/dL   RDW 13.6 11.5 - 15.5 %   Platelets 159 150 - 400 K/uL     HPI :Courtney Mcconnell is a 59 y.o. female with a history of seizures managed on Dilantin who presents with 4 seizures in the past 24 hours. She continues to be postictal. Her son states that he thinks that she was taking her Dilantin as prescribed, but previous notes  raise concerns for noncompliance. She was diagnosed recently with bronchitis and started on doxycycline as well as azithromycin.   She had her first seizure last night around 11 PM and has had 4 seizures since then, most recent around 11 AM. She was given some Ativan and her Dilantin level was found to be low.   HOSPITAL COURSE: * Seizures Uneventful neuro telemetry admission -Likely etiology secondary to subtherapeutic Dilantin level -Patient has been loaded with Dilantin in the ER and pending full recovery from prolonged post ictal phase seen by neurology, however patient states that she is compliant with Dilantin  Dilantin discontinued by neurology, patient switched to vimpat and received a full loading dose  Changed to Vimpat 100 mg BID and tolerating well Follow up with primary neurologist Dr. Leta Baptist in 3 weeks as out patient  Pneumonia/community acquired patient started on levofloxacin, no respiratory compromise     Acute renal failure/Dehydration/Hypokalemia DC IV fluids Replete potassium   HTN  -Was not on medications prior to admission When necessary hydralazine   Asthma/? Recent bronchitis -Appears compensated -Continue home albuterol MDI -Had fevers on 3/2 up to 102 and was prescribed  antibiotics at [facility] ER Started on Levaquin negative influenza PCR   Code Status: full   Discharge Exam:  Blood pressure 128/66, pulse 63, temperature 97.7 F (36.5 C), temperature source Oral, resp. rate 16, height _0  (1.676 m), weight 81.647 kg (180 lb), SpO2 99 %.  General: No acute respiratory distress Lungs: Clear to auscultation bilaterally without wheezes or crackles Cardiovascular: Regular rate and rhythm without murmur gallop or rub normal S1 and S2 Abdomen: Nontender, nondistended, soft, bowel sounds positive, no rebound, no ascites, no appreciable mass Extremities: No significant cyanosis, clubbing, or edema bilateral lower extremities        Discharge Instructions    Diet - low sodium heart healthy    Complete by:  As directed      Diet - low sodium heart healthy    Complete by:  As directed      Increase activity slowly    Complete by:  As directed      Increase activity slowly    Complete by:  As directed            Follow-up Information    Follow up with PENUMALLI,VIKRAM, MD. Schedule an appointment as soon as possible for a visit in 3 weeks.   Specialties:  Neurology, Radiology   Contact information:   [address] [place] [address] [phone]       Follow up with Redge Gainer, MD. Schedule an appointment as soon as possible for a visit in 3 days.   Specialty:  Family Medicine   Contact information:   [place] [address] [phone]       Signed: Reyne Dumas 04/20/2014, 1:28 PM

## 2014-04-20 NOTE — Discharge Instructions (Signed)
° °  Patient may go back to work on 04/26/14

## 2014-04-20 NOTE — Progress Notes (Signed)
Subjective: Doing well, no further seizures.  No SE on Vimpat and tolerating medication well.   Objective: Current vital signs: BP 139/59 mmHg  Pulse 60  Temp(Src) 98.2 F (36.8 C) (Oral)  Resp 16  Ht 5\' 6"  (1.676 m)  Wt 81.647 kg (180 lb)  BMI 29.07 kg/m2  SpO2 98% Vital signs in last 24 hours: Temp:  [97.5 F (36.4 C)-98.6 F (37 C)] 98.2 F (36.8 C) (03/08 0539) Pulse Rate:  [60-72] 60 (03/08 0539) Resp:  [16-18] 16 (03/08 0539) BP: (111-139)/(49-70) 139/59 mmHg (03/08 0539) SpO2:  [96 %-100 %] 98 % (03/08 0539)  Intake/Output from previous day: 03/07 0701 - 03/08 0700 In: 240 [P.O.:240] Out: -  Intake/Output this shift:   Nutritional status: Diet - low sodium heart healthy Diet Heart  Neurologic Exam: General: NAD Mental Status: Alert, oriented, thought content appropriate.  Speech fluent without evidence of aphasia.  Able to follow 3 step commands without difficulty. Cranial Nerves: II: Discs flat bilaterally; Visual fields grossly normal, pupils equal, round, reactive to light and accommodation III,IV, VI: ptosis not present, extra-ocular motions intact bilaterally V,VII: smile symmetric, facial light touch sensation normal bilaterally VIII: hearing normal bilaterally IX,X: uvula rises symmetrically XI: bilateral shoulder shrug XII: midline tongue extension without atrophy or fasciculations  Motor: Right : Upper extremity   5/5    Left:     Upper extremity   5/5  Lower extremity   5/5     Lower extremity   5/5 Tone and bulk:normal tone throughout; no atrophy noted Sensory: Pinprick and light touch intact throughout, bilaterally Deep Tendon Reflexes:  Right: Upper Extremity   Left: Upper extremity   biceps (C-5 to C-6) 2/4   biceps (C-5 to C-6) 2/4 tricep (C7) 2/4    triceps (C7) 2/4 Brachioradialis (C6) 2/4  Brachioradialis (C6) 2/4  Lower Extremity Lower Extremity  quadriceps (L-2 to L-4) 2/4   quadriceps (L-2 to L-4) 2/4 Achilles (S1) 2/4   Achilles  (S1) 2/4  Plantars: Right: downgoing   Left: downgoing Cerebellar: normal finger-to-nose,  normal heel-to-shin test    Lab Results: Basic Metabolic Panel:  Recent Labs Lab 04/18/14 1353 04/19/14 0710 04/20/14 0631  NA 135 136 141  K 4.1 3.4* 3.9  CL 101 104 107  CO2 30 25 28   GLUCOSE 113* 137* 128*  BUN 13 7 8   CREATININE 0.82 0.60 0.65  CALCIUM 9.0 8.3* 9.0    Liver Function Tests:  Recent Labs Lab 04/20/14 0631  AST 17  ALT 9  ALKPHOS 124*  BILITOT 0.3  PROT 5.9*  ALBUMIN 3.1*   No results for input(s): LIPASE, AMYLASE in the last 168 hours. No results for input(s): AMMONIA in the last 168 hours.  CBC:  Recent Labs Lab 04/18/14 1353 04/19/14 0710 04/20/14 0631  WBC 9.1 7.0 5.5  NEUTROABS 6.6  --   --   HGB 13.5 12.7 12.0  HCT 40.7 38.1 37.2  MCV 95.5 96.9 99.2  PLT 179 178 159    Cardiac Enzymes: No results for input(s): CKTOTAL, CKMB, CKMBINDEX, TROPONINI in the last 168 hours.  Lipid Panel: No results for input(s): CHOL, TRIG, HDL, CHOLHDL, VLDL, LDLCALC in the last 168 hours.  CBG:  Recent Labs Lab 04/18/14 5573  UKGURK 27    Microbiology: No results found for this or any previous visit.  Coagulation Studies: No results for input(s): LABPROT, INR in the last 72 hours.  Imaging: Dg Chest 2 View  04/18/2014   CLINICAL DATA:  Cough for 2 days.  EXAM: CHEST  2 VIEW  COMPARISON:  08/27/2013  FINDINGS: Cardiomegaly again identified.  Patchy posterior lower lobe opacities on lateral view or suspicious for pneumonia.  There is no evidence of pulmonary edema, suspicious pulmonary nodule/mass, pleural effusion, or pneumothorax.  No acute bony abnormalities are identified.  IMPRESSION: Patchy posterior lower lobe opacities on the lateral view -suspicious for pneumonia.  Cardiomegaly.   Electronically Signed   By: Margarette Canada M.D.   On: 04/18/2014 17:04   Ct Head Wo Contrast  04/18/2014   CLINICAL DATA:  Seizures. Multiple seizures last night.  No other history obtainable. Initial encounter.  EXAM: CT HEAD WITHOUT CONTRAST  TECHNIQUE: Contiguous axial images were obtained from the base of the skull through the vertex without intravenous contrast.  COMPARISON:  08/06/2012.  FINDINGS: No mass lesion, mass effect, midline shift, hydrocephalus, hemorrhage. No territorial ischemia or acute infarction. Paranasal sinuses are within normal limits. Small amount of fluid is present in both mastoid air cells. Intracranial atherosclerosis.  IMPRESSION: Negative CT head.   Electronically Signed   By: Dereck Ligas M.D.   On: 04/18/2014 15:10    Medications:  Scheduled: . docusate sodium  100 mg Oral BID  . heparin  5,000 Units Subcutaneous 3 times per day  . lacosamide  100 mg Oral BID  . levofloxacin  500 mg Oral Daily  . pantoprazole  40 mg Oral QPM  . sodium chloride  3 mL Intravenous Q12H    Assessment/Plan: 59 year old female with breakthrough seizures in the setting of subtherapeutic Dilantin levels. Patient states she has been compliant with Dilantin and is unsure why her level is not remaining therapeutic. Looking through the notes she has been on Keppra in the past and felt "not in this world". Changed to Vimpat 100 mg BID and tolerating well.    Recommend: 1) Continue Vimpat 2) Follow up with primary neurologist Dr. Leta Baptist in 3 weeks as out patient.   Neurology S/O  Etta Quill PA-C [facility] [phone]  04/20/2014, 9:19 AM

## 2014-04-20 NOTE — Progress Notes (Signed)
This is a note of an occurrence after pt was discharged home.  This NP was paged by RN, Denice Paradise, tonight stating pt couldn't get her Rx for Vimpat filled at pharmacy due to it needing a prior authorization. Asked Rich to call pharmacy and see if they could give the pt one dose for cash and do the prior authorization in am of 04/21/14. Pharmacy agreed so pt wouldn't miss a dose of her medicine. Rich will f/up on preauth tomorrow. This NP will leave a message for [facility] who d/c pt.  Clance Boll, NP [facility]

## 2014-04-20 NOTE — Progress Notes (Signed)
Discharge orders received, pt for discharge home today, IV and telemtry D/C.  D/C instructions and Rx given with verbalized understanding.  Family at bedside to assist pt with discharge. Staff brought pt downstairs via wheelchair.

## 2014-05-11 ENCOUNTER — Encounter: Payer: Self-pay | Admitting: Diagnostic Neuroimaging

## 2014-05-11 ENCOUNTER — Ambulatory Visit (INDEPENDENT_AMBULATORY_CARE_PROVIDER_SITE_OTHER): Payer: 59 | Admitting: Diagnostic Neuroimaging

## 2014-05-11 VITALS — BP 120/73 | HR 66 | Temp 99.3°F | Resp 16 | Ht 66.0 in | Wt 176.0 lb

## 2014-05-11 DIAGNOSIS — G40309 Generalized idiopathic epilepsy and epileptic syndromes, not intractable, without status epilepticus: Secondary | ICD-10-CM | POA: Diagnosis not present

## 2014-05-11 MED ORDER — LACOSAMIDE 200 MG PO TABS: 200.0000 mg | ORAL_TABLET | Freq: Two times a day (BID) | ORAL | Status: DC

## 2014-05-11 NOTE — Patient Instructions (Signed)
Increase vimpat to 200mg  twice a day.

## 2014-05-11 NOTE — Progress Notes (Signed)
[facility]  PATIENT: Courtney Mcconnell DOB: December 08, 1955  REFERRING CLINICIAN:  HISTORY FROM: patient, daughter REASON FOR VISIT: follow up   HISTORICAL  CHIEF COMPLAINT:  Chief Complaint  Patient presents with  . Seizures    Rm 6 F/U    HISTORY OF PRESENT ILLNESS:   UPDATE 05/11/14: Since last visit, had seizures in Feb 2016 (x2), 04/17/14 (1x), 04/18/14 (3x), 05/10/14 (1x) and 05/11/14 (2am, 3am). Went to hosp on 04/18/14 due to freq sz and facial drooping. Had CT head and labs, and DPH was changed to vimpat, then d/c home. Did well until last night and this AM (1 sz last night and 2 sz this AM). All sz are noctural in her sleep. Some soreness and tired feeling today. Tolerating vimpat.   UPDATE 01/05/14: Since last visit, has had 4 seizures. 2 seizures last Sunday, separated by 3 hours. Lips turned blue. Patient declined hospital visit. Taking dilantin 300mg  at bedtime. Had URI symptoms the week prior to seizures.   UPDATE 06/29/13: Since last visit, had breakthrough seizure in April 2015 and another one last Thursday (06/25/13). She had been having cough, dx'd with URI and rx'd medrol + amoxicillin. Seizure occurred at 2am, in her sleep. She was found by family in the bathroom, on the floor, convulsing, bruised, eyes barely open. Lasted ~ 10-20 min. Then post ictal snoring 15-20 min, then confusion and wandering around the home for next 1 hour. Admits that she has been missing doses of LEV XR 500mg  daily because of cost ($86/month) and side effects (agitation, mood, depression).   UPDATE 12/11/12: Since last visit patient doing well and having no further seizures. She's tolerating levetiracetam XR 500 mg at bedtime without side effects. Patient is under more stress as her husband has been recently diagnosed with cancer.   PRIOR HPI (09/03/12): 59 year old female here for seizure disorder evaluation. Patient is accompanied by 3 family members. She has no recollection of the 2  episodes. Family member explains that first episode occurred in March 2014, she awoke at 5:00am, convulsions started in bed, husband could not wake her up. Convulsions lasted about 30 minutes, husband put wet rag on her face and when it stopped and she started to wake up. There was no tongue-biting or incontinence of bowel or bladder. They did not seek medical care. Another event happened in the morning on 08/06/12, family reports that patient got up as usual in the morning, went to the bathroom, went to he kitchen, got a cigarette and sat at the kitchen table. Then she developed staring and then convulsions, lasting about 15 minutes, afterward she was taken to [facility] ED by family. She was started on LEV 250 mg BID at ED discharge, CT head was negative. Patient denies any history of stroke or family history of seizure. Patient reports she is only taking LEV 250 mg at night because taking it twice a day made her feel like "she wasn't in the world". Has had occasional olfactory hallucinations of vinegar, and has had some deja vu episodes. Current every day smoker.    REVIEW OF SYSTEMS: Full 14 system review of systems performed and notable only for dizziness memory lss headache numbness facial drooping ringing in ears ear pain excess sweating fatigue cramps pain snoring sleep walking confusion. .  ALLERGIES: Allergies  Allergen Reactions  . Bee Venom Swelling  . Amoxicillin-Pot Clavulanate Rash  . Doxycycline Hyclate Rash    Rash and itching    HOME  MEDICATIONS: Outpatient Prescriptions Prior to Visit  Medication Sig Dispense Refill  . albuterol (PROVENTIL HFA;VENTOLIN HFA) 108 (90 BASE) MCG/ACT inhaler Inhale 2 puffs into the lungs every 6 (six) hours as needed for wheezing or shortness of breath. 1 Inhaler 2  . omeprazole (PRILOSEC OTC) 20 MG tablet Take 20 mg by mouth every evening.    . lacosamide 100 MG TABS Take 1 tablet (100 mg total) by mouth 2 (two) times daily. 60 tablet 2   No  facility-administered medications prior to visit.    PAST MEDICAL HISTORY: Past Medical History  Diagnosis Date  . Hypertension   . Seizure   . Asthma   . Pneumonia   . GERD (gastroesophageal reflux disease)   . Headache(784.0)   . Bursitis of left hip     right    PAST SURGICAL HISTORY: Past Surgical History  Procedure Laterality Date  . Abdominal hysterectomy    . Cholecystectomy    . Appendectomy    . Cataract extraction Bilateral   . Pars plana vitrectomy Right 08/27/2013    Procedure: PARS PLANA VITRECTOMY WITH 25 GAUGE;  Surgeon: Hayden Pedro, MD;  Location: [facility];  Service: Ophthalmology;  Laterality: Right;  . Membrane peel Right 08/27/2013    Procedure: MEMBRANE PEEL;  Surgeon: Hayden Pedro, MD;  Location: [facility];  Service: Ophthalmology;  Laterality: Right;  . Laser photo ablation Right 08/27/2013    Procedure: LASER PHOTO ABLATION;  Surgeon: Hayden Pedro, MD;  Location: [facility];  Service: Ophthalmology;  Laterality: Right;  Headscope laser AND Endolaser    FAMILY HISTORY: Family History  Problem Relation Age of Onset  . Diabetes Father   . Hypertension Father   . Hyperlipidemia Father     SOCIAL HISTORY:  History   Social History  . Marital Status: Married    Spouse Name: [name]  . Number of Children: 2  . Years of Education: GED   Occupational History  . [employer]    Social History Main Topics  . Smoking status: Former Smoker -- 1.00 packs/day for 42 years    Types: Cigarettes    Quit date: 03/20/2014  . Smokeless tobacco: Never Used  . Alcohol Use: No  . Drug Use: No  . Sexual Activity: Not on file   Other Topics Concern  . Not on file   Social History Narrative   Patient lives at home with family.   Caffeine Use: 3 pots of coffee daily     PHYSICAL EXAM  Filed Vitals:   05/11/14 1333  BP: 120/73  Pulse: 66  Temp: 99.3 F (37.4 C)  TempSrc: Oral  Resp: 16  Height: 5\' 6"  (1.676 m)  Weight: 176 lb (79.833 kg)    Not recorded       Body mass index is 28.42 kg/(m^2).  GENERAL EXAM: Patient is in no distress; well developed, nourished and groomed; neck is supple; SEVERE CIGARETTE SMOKE SMELL IN ROOM ON PATIENT AND DAUGHTER.   CARDIOVASCULAR: Regular rate and rhythm, no murmurs, no carotid bruits  NEUROLOGIC: MENTAL STATUS: awake, alert, language fluent, comprehension intact, naming intact, fund of knowledge appropriate CRANIAL NERVE: pupils equal and reactive to light, visual fields full to confrontation, extraocular muscles intact, no nystagmus, facial sensation and strength symmetric, hearing intact, palate elevates symmetrically, uvula midline, shoulder shrug symmetric, tongue midline. MOTOR: normal bulk and tone, full strength in the BUE, BLE SENSORY: normal and symmetric to light touch  COORDINATION: finger-nose-finger, fine finger movements normal REFLEXES:  deep tendon reflexes present and symmetric GAIT/STATION: narrow based gait; CANNOT TANDEM   DIAGNOSTIC DATA (LABS, IMAGING, TESTING) - I reviewed patient records, labs, notes, testing and imaging myself where available.  Lab Results  Component Value Date   WBC 5.5 04/20/2014   HGB 12.0 04/20/2014   HCT 37.2 04/20/2014   MCV 99.2 04/20/2014   PLT 159 04/20/2014      Component Value Date/Time   NA 141 04/20/2014 0631   NA 139 01/05/2014 1513   K 3.9 04/20/2014 0631   CL 107 04/20/2014 0631   CO2 28 04/20/2014 0631   GLUCOSE 128* 04/20/2014 0631   GLUCOSE 86 01/05/2014 1513   BUN 8 04/20/2014 0631   BUN 10 01/05/2014 1513   CREATININE 0.65 04/20/2014 0631   CALCIUM 9.0 04/20/2014 0631   PROT 5.9* 04/20/2014 0631   PROT 6.6 01/05/2014 1513   ALBUMIN 3.1* 04/20/2014 0631   AST 17 04/20/2014 0631   ALT 9 04/20/2014 0631   ALKPHOS 124* 04/20/2014 0631   BILITOT 0.3 04/20/2014 0631   GFRNONAA >90 04/20/2014 0631   GFRAA >90 04/20/2014 0631   No results found for: CHOL No results found for: HGBA1C No results found for: VITAMINB12 No  results found for: TSH  08/06/12 CT head - no acute findings   09/17/12 MRI brain - few scattered periventricular and subcortical and pontine chronic small vessel ischemic disease. No acute findings.   09/10/12 EEG - normal  04/18/14 CT head - negative    ASSESSMENT AND PLAN  59 y.o. year old female here with with multiple episodes of nocturnal, witnessed convulsions, suspicious for generalized seizure disorder. Previously couldn't tolerate levetiracetam (cost and side effects). DOH was tried but levels always low. Now on vimpat. Last event this AM (05/11/14 3am).  PLAN:  1. Increase vimpat to 200mg  BID 2. Advised patient and daughter to go to vimpat website for patient / copay assistance 3. No driving until seizure free x 6 months  Return in about 3 months (around 08/11/2014).    Penni Bombard, MD 2/29/7989, 2:11 PM Certified in Neurology, Neurophysiology and Neuroimaging  [facility] [address], [place], [place] [address] [phone]

## 2014-05-12 ENCOUNTER — Telehealth: Payer: Self-pay

## 2014-05-12 NOTE — Telephone Encounter (Signed)
Patient came in here at 4:20 3/29 to ask me to do a [facility] compass referral for an appointment that she had been to earlier in the day with Dr Karma Ganja [facility].  She said that our name had not been on her [facility] card but her daughter had called and straightened that out.  I came into my office immediately upon her leaving and started the online referral.  The referral would not go through because we were still not the PCP on her card.  I called Courtney Mcconnell's home and LM with her granddaughter that as soon as she got home that she needed to call me because she had 40 minutes to call [employer] and change the PCP on her card so that I could do the referral for the appt that she had gone to .   These referrals can not be back dated.  She did not call me back.  I called the patient's home 05/02/14 at 9:25 and was told by her husband that she was at work.  I told him that it was very important that she call me and that I had left a message yesterday with the grand daughter  He said he would let her know.

## 2014-05-13 ENCOUNTER — Telehealth: Payer: Self-pay

## 2014-05-13 NOTE — Telephone Encounter (Signed)
Ms. Clapham called me back 4:30 on 3/30 and I explained that I had left a message for her the day before with her grand daughter that her PCP had not been changed so that I could not do her referral and she said well she told me and I went directly over to the insurance agent and they changed it.  I said well you did not call me to tell me that so I did not know to go ahead an try the referral again .  I only had a 40 minute window to get the referral done since you came in and requested the referral after having already having seen the dr.  I told the patient she would have to try to argue with her insurance company to back date the referral and that I would go ahead an put it in today.

## 2014-06-01 ENCOUNTER — Encounter: Payer: Self-pay | Admitting: Physician Assistant

## 2014-06-01 ENCOUNTER — Ambulatory Visit (INDEPENDENT_AMBULATORY_CARE_PROVIDER_SITE_OTHER): Payer: 59 | Admitting: Physician Assistant

## 2014-06-01 VITALS — BP 149/77 | HR 99 | Temp 97.2°F | Ht 66.0 in | Wt 191.0 lb

## 2014-06-01 DIAGNOSIS — J069 Acute upper respiratory infection, unspecified: Secondary | ICD-10-CM | POA: Diagnosis not present

## 2014-06-01 MED ORDER — AZITHROMYCIN 250 MG PO TABS: ORAL_TABLET | ORAL | Status: DC

## 2014-06-01 MED ORDER — CETIRIZINE HCL 10 MG PO TABS: 10.0000 mg | ORAL_TABLET | Freq: Every day | ORAL | Status: DC

## 2014-06-01 MED ORDER — FLUTICASONE PROPIONATE 50 MCG/ACT NA SUSP: 2.0000 | Freq: Every day | NASAL | Status: DC

## 2014-06-01 NOTE — Progress Notes (Signed)
   Subjective:    Patient ID: Courtney Mcconnell, female    DOB: November 23, 1955, 59 y.o.   MRN: [id number]  HPI 59 y/o female presents with c/o cough, right flank pain x 1 week. SOB with exertion. Has tried Alka Seltzer cold and cough with no relief. Frequent episodes of bronchitis in the past. Stopped smoking in Feb 2016. Has an inhaler but has not been using it on a regular basis     Review of Systems  Constitutional: Negative.   HENT: Positive for congestion (chest ), ear pain (left ), postnasal drip, rhinorrhea, sinus pressure and sneezing. Negative for sore throat.   Respiratory: Positive for cough (productive ), chest tightness, shortness of breath (with exertion or at night ) and wheezing.   Cardiovascular: Negative.        Objective:   Physical Exam  Constitutional: She is oriented to person, place, and time. She appears well-developed and well-nourished. No distress.  HENT:  Head: Normocephalic and atraumatic.  Right Ear: External ear normal.  Erythematous TM on left ear   Eyes: Pupils are equal, round, and reactive to light.  Cardiovascular: Normal rate, regular rhythm and normal heart sounds.  Exam reveals no gallop and no friction rub.   No murmur heard. Pulmonary/Chest: Effort normal. No respiratory distress. She has wheezes (Trace expiratory wheeze on RUL). She exhibits no tenderness.  Musculoskeletal:  TTP on right trapezius muscle  Neurological: She is alert and oriented to person, place, and time.  Skin: She is not diaphoretic.  Psychiatric: She has a normal mood and affect. Her behavior is normal. Judgment and thought content normal.  Nursing note and vitals reviewed.         Assessment & Plan:  1. Acute upper respiratory infection   - azithromycin (ZITHROMAX) 250 MG tablet; 2 tabs on day 1, then 1 tab on day 2-5  Dispense: 6 tablet; Refill: 0 - fluticasone (FLONASE) 50 MCG/ACT nasal spray; Place 2 sprays into both nostrils daily.  Dispense: 16 g; Refill: 6 -  cetirizine (ZYRTEC) 10 MG tablet; Take 1 tablet (10 mg total) by mouth daily.  Dispense: 30 tablet; Refill: 11    RTO 2weeks

## 2014-06-01 NOTE — Patient Instructions (Signed)
Upper Respiratory Infection, Adult An upper respiratory infection (URI) is also known as the common cold. It is often caused by a type of germ (virus). Colds are easily spread (contagious). You can pass it to others by kissing, coughing, sneezing, or drinking out of the same glass. Usually, you get better in 1 or 2 weeks.  HOME CARE   Only take medicine as told by your doctor.  Use a warm mist humidifier or breathe in steam from a hot shower.  Drink enough water and fluids to keep your pee (urine) clear or pale yellow.  Get plenty of rest.  Return to work when your temperature is back to normal or as told by your doctor. You may use a face mask and wash your hands to stop your cold from spreading. GET HELP RIGHT AWAY IF:   After the first few days, you feel you are getting worse.  You have questions about your medicine.  You have chills, shortness of breath, or brown or red spit (mucus).  You have yellow or brown snot (nasal discharge) or pain in the face, especially when you bend forward.  You have a fever, puffy (swollen) neck, pain when you swallow, or white spots in the back of your throat.  You have a bad headache, ear pain, sinus pain, or chest pain.  You have a high-pitched whistling sound when you breathe in and out (wheezing).  You have a lasting cough or cough up blood.  You have sore muscles or a stiff neck. MAKE SURE YOU:   Understand these instructions.  Will watch your condition.  Will get help right away if you are not doing well or get worse. Document Released: 07/18/2007 Document Revised: 04/23/2011 Document Reviewed: 05/06/2013 Exit[employer] Patient Information 2015 [employer]. This information is not intended to replace advice given to you by your health care provider. Make sure you discuss any questions you have with your health care provider.  

## 2014-06-17 ENCOUNTER — Ambulatory Visit: Payer: 59 | Admitting: Physician Assistant

## 2014-06-21 ENCOUNTER — Telehealth: Payer: Self-pay | Admitting: Diagnostic Neuroimaging

## 2014-06-21 NOTE — Telephone Encounter (Signed)
Spoke to the pts daughter on the phone who explained that the pt still had dilantin "left over" and has stopped taking her vimpat on Saturday and resumed dilantin. She explained the symptoms to me as well. I told her I would talk with Dr. Leta Baptist about this and get back with her.  Talked with Dr. Leta Baptist who stated that he felt that the pts symptoms were not side effects and he did not want her taking medication that he had discontinued. He also explained that the dilantin levels were never where they needed to be with the pt and vimpat was a better option. He asked me to call the daughter back and tell her the pt needed to be on vimpat and if they wanted, they could make an appt to come in sooner and discuss changing medications  I called the daughter back and explained what Dr. Leta Baptist said.  She stated that her mother would not take the vimpat and asked for an appt to get her in soon to talk about changing the medication. I was able to get her an appt 06/28/14. She thanked me

## 2014-06-21 NOTE — Telephone Encounter (Signed)
Courtney Mcconnell, pt's daughter called stating that the patient is having a reaction from lacosamide (VIMPAT) 200 MG TABS tablet, she was experiencing shortness of breaths and severe tingling and numbness on both hands. Patient stopped taking the script on Saturday and starting taking another script for seizures (does not remember the name) Please call and advice # [phone]

## 2014-06-28 ENCOUNTER — Encounter: Payer: Self-pay | Admitting: Diagnostic Neuroimaging

## 2014-06-28 ENCOUNTER — Ambulatory Visit (INDEPENDENT_AMBULATORY_CARE_PROVIDER_SITE_OTHER): Payer: 59 | Admitting: Diagnostic Neuroimaging

## 2014-06-28 VITALS — BP 165/87 | HR 59 | Ht 66.0 in | Wt 191.2 lb

## 2014-06-28 DIAGNOSIS — G40909 Epilepsy, unspecified, not intractable, without status epilepticus: Secondary | ICD-10-CM | POA: Diagnosis not present

## 2014-06-28 NOTE — Progress Notes (Signed)
[facility]  PATIENT: Courtney Mcconnell DOB: Jun 26, 1955  REFERRING CLINICIAN:  HISTORY FROM: patient, daughter REASON FOR VISIT: follow up   HISTORICAL  CHIEF COMPLAINT:  Chief Complaint  Patient presents with  . Follow-up    epilepsy    HISTORY OF PRESENT ILLNESS:   UPDATE 06/28/14: Stopped vimpat on 06/21/14. She was concerned about dizziness, chest pain, blurred vision; but these have been going on from before vimpat to some extent, and have continued since stopped vimpat. BP is elevated today.   UPDATE 05/11/14: Since last visit, had seizures in Feb 2016 (x2), 04/17/14 (1x), 04/18/14 (3x), 05/10/14 (1x) and 05/11/14 (2am, 3am). Went to hosp on 04/18/14 due to freq sz and facial drooping. Had CT head and labs, and DPH was changed to vimpat, then d/c home. Did well until last night and this AM (1 sz last night and 2 sz this AM). All sz are noctural in her sleep. Some soreness and tired feeling today. Tolerating vimpat.   UPDATE 01/05/14: Since last visit, has had 4 seizures. 2 seizures last Sunday, separated by 3 hours. Lips turned blue. Patient declined hospital visit. Taking dilantin 300mg  at bedtime. Had URI symptoms the week prior to seizures.   UPDATE 06/29/13: Since last visit, had breakthrough seizure in April 2015 and another one last Thursday (06/25/13). She had been having cough, dx'd with URI and rx'd medrol + amoxicillin. Seizure occurred at 2am, in her sleep. She was found by family in the bathroom, on the floor, convulsing, bruised, eyes barely open. Lasted ~ 10-20 min. Then post ictal snoring 15-20 min, then confusion and wandering around the home for next 1 hour. Admits that she has been missing doses of LEV XR 500mg  daily because of cost ($86/month) and side effects (agitation, mood, depression).   UPDATE 12/11/12: Since last visit patient doing well and having no further seizures. She's tolerating levetiracetam XR 500 mg at bedtime without side effects. Patient  is under more stress as her husband has been recently diagnosed with cancer.   PRIOR HPI (09/03/12): 59 year old female here for seizure disorder evaluation. Patient is accompanied by 3 family members. She has no recollection of the 2 episodes. Family member explains that first episode occurred in March 2014, she awoke at 5:00am, convulsions started in bed, husband could not wake her up. Convulsions lasted about 30 minutes, husband put wet rag on her face and when it stopped and she started to wake up. There was no tongue-biting or incontinence of bowel or bladder. They did not seek medical care. Another event happened in the morning on 08/06/12, family reports that patient got up as usual in the morning, went to the bathroom, went to he kitchen, got a cigarette and sat at the kitchen table. Then she developed staring and then convulsions, lasting about 15 minutes, afterward she was taken to [facility] ED by family. She was started on LEV 250 mg BID at ED discharge, CT head was negative. Patient denies any history of stroke or family history of seizure. Patient reports she is only taking LEV 250 mg at night because taking it twice a day made her feel like "she wasn't in the world". Has had occasional olfactory hallucinations of vinegar, and has had some deja vu episodes. Current every day smoker.    REVIEW OF SYSTEMS: Full 14 system review of systems performed and notable only for dizziness memory loss confusion snoring hearing loss ringing in ears runny nose excessive thirst light sens  blurred vision.    ALLERGIES: Allergies  Allergen Reactions  . Bee Venom Swelling  . Amoxicillin-Pot Clavulanate Rash  . Doxycycline Hyclate Rash    Rash and itching    HOME MEDICATIONS: Outpatient Prescriptions Prior to Visit  Medication Sig Dispense Refill  . albuterol (PROVENTIL HFA;VENTOLIN HFA) 108 (90 BASE) MCG/ACT inhaler Inhale 2 puffs into the lungs every 6 (six) hours as needed for wheezing or shortness of  breath. 1 Inhaler 2  . cetirizine (ZYRTEC) 10 MG tablet Take 1 tablet (10 mg total) by mouth daily. 30 tablet 11  . omeprazole (PRILOSEC OTC) 20 MG tablet Take 20 mg by mouth every evening.    . lacosamide (VIMPAT) 200 MG TABS tablet Take 1 tablet (200 mg total) by mouth 2 (two) times daily. 60 tablet 5  . fluticasone (FLONASE) 50 MCG/ACT nasal spray Place 2 sprays into both nostrils daily. (Patient not taking: Reported on 06/28/2014) 16 g 6  . azithromycin (ZITHROMAX) 250 MG tablet 2 tabs on day 1, then 1 tab on day 2-5 6 tablet 0   No facility-administered medications prior to visit.    PAST MEDICAL HISTORY: Past Medical History  Diagnosis Date  . Hypertension   . Seizure   . Asthma   . Pneumonia   . GERD (gastroesophageal reflux disease)   . Headache(784.0)   . Bursitis of left hip     right    PAST SURGICAL HISTORY: Past Surgical History  Procedure Laterality Date  . Abdominal hysterectomy    . Cholecystectomy    . Appendectomy    . Cataract extraction Bilateral   . Pars plana vitrectomy Right 08/27/2013    Procedure: PARS PLANA VITRECTOMY WITH 25 GAUGE;  Surgeon: Hayden Pedro, MD;  Location: [place];  Service: Ophthalmology;  Laterality: Right;  . Membrane peel Right 08/27/2013    Procedure: MEMBRANE PEEL;  Surgeon: Hayden Pedro, MD;  Location: [place];  Service: Ophthalmology;  Laterality: Right;  . Laser photo ablation Right 08/27/2013    Procedure: LASER PHOTO ABLATION;  Surgeon: Hayden Pedro, MD;  Location: [place];  Service: Ophthalmology;  Laterality: Right;  Headscope laser AND Endolaser    FAMILY HISTORY: Family History  Problem Relation Age of Onset  . Diabetes Father   . Hypertension Father   . Hyperlipidemia Father     SOCIAL HISTORY:  History   Social History  . Marital Status: Married    Spouse Name: [name]  . Number of Children: 2  . Years of Education: GED   Occupational History  . [employer]    Social History Main Topics  . Smoking status:  Former Smoker -- 1.00 packs/day for 42 years    Types: Cigarettes    Quit date: 03/20/2014  . Smokeless tobacco: Never Used  . Alcohol Use: No  . Drug Use: No  . Sexual Activity: Not on file   Other Topics Concern  . Not on file   Social History Narrative   Patient lives at home with family.   Caffeine Use: 1 pot and a half a day     PHYSICAL EXAM  Filed Vitals:   06/28/14 1011  BP: 165/87  Pulse: 59  Height: 5\' 6"  (1.676 m)  Weight: 191 lb 3.2 oz (86.728 kg)    Not recorded      Body mass index is 30.88 kg/(m^2).  GENERAL EXAM: Patient is in no distress; well developed, nourished and groomed; neck is supple; SEVERE CIGARETTE SMOKE SMELL IN ROOM  ON PATIENT AND DAUGHTER.   CARDIOVASCULAR: Regular rate and rhythm, no murmurs, no carotid bruits  NEUROLOGIC: MENTAL STATUS: awake, alert, language fluent, comprehension intact, naming intact, fund of knowledge appropriate CRANIAL NERVE: pupils equal and reactive to light, visual fields full to confrontation, extraocular muscles intact, no nystagmus, facial sensation and strength symmetric, hearing intact, palate elevates symmetrically, uvula midline, shoulder shrug symmetric, tongue midline. MOTOR: normal bulk and tone, full strength in the BUE, BLE SENSORY: normal and symmetric to light touch  COORDINATION: finger-nose-finger, fine finger movements normal REFLEXES: deep tendon reflexes present and symmetric GAIT/STATION: narrow based gait; CANNOT TANDEM   DIAGNOSTIC DATA (LABS, IMAGING, TESTING) - I reviewed patient records, labs, notes, testing and imaging myself where available.  Lab Results  Component Value Date   WBC 5.5 04/20/2014   HGB 12.0 04/20/2014   HCT 37.2 04/20/2014   MCV 99.2 04/20/2014   PLT 159 04/20/2014      Component Value Date/Time   NA 141 04/20/2014 0631   NA 139 01/05/2014 1513   K 3.9 04/20/2014 0631   CL 107 04/20/2014 0631   CO2 28 04/20/2014 0631   GLUCOSE 128* 04/20/2014 0631    GLUCOSE 86 01/05/2014 1513   BUN 8 04/20/2014 0631   BUN 10 01/05/2014 1513   CREATININE 0.65 04/20/2014 0631   CALCIUM 9.0 04/20/2014 0631   PROT 5.9* 04/20/2014 0631   PROT 6.6 01/05/2014 1513   ALBUMIN 3.1* 04/20/2014 0631   AST 17 04/20/2014 0631   ALT 9 04/20/2014 0631   ALKPHOS 124* 04/20/2014 0631   BILITOT 0.3 04/20/2014 0631   GFRNONAA >90 04/20/2014 0631   GFRAA >90 04/20/2014 0631   No results found for: CHOL No results found for: HGBA1C No results found for: VITAMINB12 No results found for: TSH   08/06/12 CT head - no acute findings   09/17/12 MRI brain - few scattered periventricular and subcortical and pontine chronic small vessel ischemic disease. No acute findings.   09/10/12 EEG - normal  04/18/14 CT head - negative    ASSESSMENT AND PLAN  59 y.o. year old female here with with multiple episodes of nocturnal, witnessed convulsions, suspicious for generalized seizure disorder. Previously couldn't tolerate levetiracetam (cost and side effects). DPH was tried but levels always low. Was on vimpat, now she changed back to dilantin on her own. Last event this AM (Jun 14, 2014).   PLAN:  1. Restart vimpat 200mg  BID; stop dilantin 2. No driving until seizure free x 6 months 3. Referral to academic epilepsy clinic for further mgmt/evaluation of intractable sz d/o 4. Follow up with PCP re: HTN, abd pain, chest pain  Return in about 6 months (around 12/29/2014).    Penni Bombard, MD 0/24/0973, 53:29 AM Certified in Neurology, Neurophysiology and Neuroimaging  [facility] [address] [place], [place] [address] [phone]

## 2014-06-29 LAB — CBC WITH DIFFERENTIAL/PLATELET
BASOS ABS: 0 10*3/uL (ref 0.0–0.2)
Basos: 0 %
EOS (ABSOLUTE): 0.1 10*3/uL (ref 0.0–0.4)
Eos: 1 %
HEMATOCRIT: 40.9 % (ref 34.0–46.6)
Hemoglobin: 13.7 g/dL (ref 11.1–15.9)
IMMATURE GRANS (ABS): 0 10*3/uL (ref 0.0–0.1)
Immature Granulocytes: 0 %
LYMPHS: 47 %
Lymphocytes Absolute: 2.6 10*3/uL (ref 0.7–3.1)
MCH: 32.3 pg (ref 26.6–33.0)
MCHC: 33.5 g/dL (ref 31.5–35.7)
MCV: 97 fL (ref 79–97)
MONOS ABS: 0.6 10*3/uL (ref 0.1–0.9)
Monocytes: 11 %
Neutrophils Absolute: 2.3 10*3/uL (ref 1.4–7.0)
Neutrophils: 41 %
PLATELETS: 241 10*3/uL (ref 150–379)
RBC: 4.24 x10E6/uL (ref 3.77–5.28)
RDW: 13.5 % (ref 12.3–15.4)
WBC: 5.6 10*3/uL (ref 3.4–10.8)

## 2014-06-29 LAB — COMPREHENSIVE METABOLIC PANEL
ALBUMIN: 4.3 g/dL (ref 3.5–5.5)
ALK PHOS: 113 IU/L (ref 39–117)
ALT: 7 IU/L (ref 0–32)
AST: 13 IU/L (ref 0–40)
Albumin/Globulin Ratio: 1.9 (ref 1.1–2.5)
BUN/Creatinine Ratio: 11 (ref 9–23)
BUN: 9 mg/dL (ref 6–24)
CO2: 29 mmol/L (ref 18–29)
Calcium: 9.6 mg/dL (ref 8.7–10.2)
Chloride: 100 mmol/L (ref 97–108)
Creatinine, Ser: 0.79 mg/dL (ref 0.57–1.00)
GFR calc Af Amer: 95 mL/min/{1.73_m2} (ref >59)
GFR calc non Af Amer: 83 mL/min/{1.73_m2} (ref >59)
GLOBULIN, TOTAL: 2.3 g/dL (ref 1.5–4.5)
GLUCOSE: 70 mg/dL (ref 65–99)
POTASSIUM: 5.2 mmol/L (ref 3.5–5.2)
SODIUM: 140 mmol/L (ref 134–144)
Total Protein: 6.6 g/dL (ref 6.0–8.5)

## 2014-06-29 LAB — HEMOGLOBIN A1C
Est. average glucose Bld gHb Est-mCnc: 111 mg/dL
HEMOGLOBIN A1C: 5.5 % (ref 4.8–5.6)

## 2014-07-01 ENCOUNTER — Ambulatory Visit (INDEPENDENT_AMBULATORY_CARE_PROVIDER_SITE_OTHER): Payer: 59 | Admitting: Family Medicine

## 2014-07-01 ENCOUNTER — Encounter: Payer: Self-pay | Admitting: Family Medicine

## 2014-07-01 VITALS — BP 142/84 | HR 57 | Temp 97.4°F | Ht 66.0 in | Wt 193.0 lb

## 2014-07-01 DIAGNOSIS — I10 Essential (primary) hypertension: Secondary | ICD-10-CM

## 2014-07-01 MED ORDER — AMLODIPINE BESYLATE 5 MG PO TABS: 5.0000 mg | ORAL_TABLET | Freq: Every day | ORAL | Status: DC

## 2014-07-01 NOTE — Patient Instructions (Signed)

## 2014-07-01 NOTE — Progress Notes (Signed)
   Subjective:    Patient ID: Courtney Mcconnell, female    DOB: 1955/02/19, 59 y.o.   MRN: [id number]  HPI  59 year old female a seizure disorder. She was seen by her neurologist earlier this week and found to have elevated blood pressure. As we searched back through the past 2 years in our records her systolic pressure has been elevated much of the time but diastolic pressures of been mostly normal. She is asymptomatic. There is a family history probably in her father but she is not sure. Per our records, she has gained some weight in the recent months. She admits to liking salt a lot.  Patient Active Problem List   Diagnosis Date Noted  . Seizures 04/18/2014  . Acute renal failure 04/18/2014  . Dehydration 04/18/2014  . HTN (hypertension) 04/18/2014  . Asthma 04/18/2014  . 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 Wheezing 07/19/2012  . Acute bronchitis 07/19/2012  . Tobacco use disorder 07/19/2012  . Allergic rhinitis 07/19/2012  . Otitis media 07/19/2012   Outpatient Encounter Prescriptions as of 07/01/2014  Medication Sig  . albuterol (PROVENTIL HFA;VENTOLIN HFA) 108 (90 BASE) MCG/ACT inhaler Inhale 2 puffs into the lungs every 6 (six) hours as needed for wheezing or shortness of breath.  . cetirizine (ZYRTEC) 10 MG tablet Take 1 tablet (10 mg total) by mouth daily.  . fluticasone (FLONASE) 50 MCG/ACT nasal spray Place 2 sprays into both nostrils daily.  Marland Kitchen omeprazole (PRILOSEC OTC) 20 MG tablet Take 20 mg by mouth every evening.  . [DISCONTINUED] phenytoin (DILANTIN) 100 MG ER capsule Take 300 mg by mouth daily.   No facility-administered encounter medications on file as of 07/01/2014.      Review of Systems  Constitutional: Positive for unexpected weight change.  HENT: Negative.   Respiratory: Negative.   Cardiovascular: Negative.   Musculoskeletal: Negative.   Neurological: Positive for seizures.  Psychiatric/Behavioral: Negative.        Objective:   Physical Exam    Constitutional: She is oriented to person, place, and time. She appears well-developed and well-nourished.  Cardiovascular: Normal rate, regular rhythm and intact distal pulses.   Pulmonary/Chest: Effort normal and breath sounds normal.  Neurological: She is alert and oriented to person, place, and time.  Psychiatric: She has a normal mood and affect.   BP 142/84 mmHg  Pulse 57  Temp(Src) 97.4 F (36.3 C) (Oral)  Ht 5\' 6"  (1.676 m)  Wt 193 lb (87.544 kg)  BMI 31.17 kg/m2        Assessment & Plan:  1. Essential hypertension As noted above I believe she does have hypertension based on multiple readings. My goal for her blood pressure would be 135/85 or below. I have asked her to decrease her salt intake, lose weight, avoid lots of red meat and added sugar. I also reviewed lab work done at the neurology office earlier this week which shows a normal metabolic panel, normal CBC, normal hemoglobin A1c,. I will begin amlodipine 5 mg to take at the same time every day. Plan to recheck blood pressure here in one month

## 2014-07-07 ENCOUNTER — Ambulatory Visit: Payer: Self-pay | Admitting: Diagnostic Neuroimaging

## 2014-08-02 ENCOUNTER — Ambulatory Visit: Payer: 59 | Admitting: Family Medicine

## 2014-08-11 ENCOUNTER — Ambulatory Visit: Payer: 59 | Admitting: Diagnostic Neuroimaging

## 2014-08-12 ENCOUNTER — Emergency Department (HOSPITAL_COMMUNITY): Payer: 59

## 2014-08-12 ENCOUNTER — Encounter (HOSPITAL_COMMUNITY): Payer: Self-pay | Admitting: *Deleted

## 2014-08-12 ENCOUNTER — Emergency Department (HOSPITAL_COMMUNITY)
Admission: EM | Admit: 2014-08-12 | Discharge: 2014-08-12 | Disposition: A | Discharge status: Home / Self Care | Payer: 59 | Attending: Emergency Medicine | Admitting: Emergency Medicine

## 2014-08-12 DIAGNOSIS — Y9389 Activity, other specified: Secondary | ICD-10-CM | POA: Diagnosis not present

## 2014-08-12 DIAGNOSIS — Z79899 Other long term (current) drug therapy: Secondary | ICD-10-CM | POA: Insufficient documentation

## 2014-08-12 DIAGNOSIS — S80811A Abrasion, right lower leg, initial encounter: Secondary | ICD-10-CM | POA: Diagnosis not present

## 2014-08-12 DIAGNOSIS — I10 Essential (primary) hypertension: Secondary | ICD-10-CM | POA: Diagnosis not present

## 2014-08-12 DIAGNOSIS — Z87891 Personal history of nicotine dependence: Secondary | ICD-10-CM | POA: Insufficient documentation

## 2014-08-12 DIAGNOSIS — Z7951 Long term (current) use of inhaled steroids: Secondary | ICD-10-CM | POA: Insufficient documentation

## 2014-08-12 DIAGNOSIS — S8011XA Contusion of right lower leg, initial encounter: Secondary | ICD-10-CM | POA: Insufficient documentation

## 2014-08-12 DIAGNOSIS — Z880 Allergy status to penicillin: Secondary | ICD-10-CM | POA: Diagnosis not present

## 2014-08-12 DIAGNOSIS — K219 Gastro-esophageal reflux disease without esophagitis: Secondary | ICD-10-CM | POA: Diagnosis not present

## 2014-08-12 DIAGNOSIS — Y998 Other external cause status: Secondary | ICD-10-CM | POA: Diagnosis not present

## 2014-08-12 DIAGNOSIS — Z8701 Personal history of pneumonia (recurrent): Secondary | ICD-10-CM | POA: Insufficient documentation

## 2014-08-12 DIAGNOSIS — W28XXXA Contact with powered lawn mower, initial encounter: Secondary | ICD-10-CM | POA: Insufficient documentation

## 2014-08-12 DIAGNOSIS — Z8739 Personal history of other diseases of the musculoskeletal system and connective tissue: Secondary | ICD-10-CM | POA: Insufficient documentation

## 2014-08-12 DIAGNOSIS — S99921A Unspecified injury of right foot, initial encounter: Secondary | ICD-10-CM | POA: Diagnosis present

## 2014-08-12 DIAGNOSIS — J45909 Unspecified asthma, uncomplicated: Secondary | ICD-10-CM | POA: Diagnosis not present

## 2014-08-12 DIAGNOSIS — Z23 Encounter for immunization: Secondary | ICD-10-CM | POA: Diagnosis not present

## 2014-08-12 DIAGNOSIS — S93401A Sprain of unspecified ligament of right ankle, initial encounter: Secondary | ICD-10-CM | POA: Diagnosis not present

## 2014-08-12 DIAGNOSIS — Y9289 Other specified places as the place of occurrence of the external cause: Secondary | ICD-10-CM | POA: Diagnosis not present

## 2014-08-12 MED ORDER — HYDROCODONE-ACETAMINOPHEN 5-325 MG PO TABS: 1.0000 | ORAL_TABLET | ORAL | Status: DC | PRN

## 2014-08-12 MED ORDER — TETANUS-DIPHTH-ACELL PERTUSSIS 5-2.5-18.5 LF-MCG/0.5 IM SUSP
0.5000 mL | Freq: Once | INTRAMUSCULAR | Status: AC
  Administered 2014-08-12: 0.5 mL via INTRAMUSCULAR
  Filled 2014-08-12: qty 0.5

## 2014-08-12 MED ORDER — BACITRACIN-NEOMYCIN-POLYMYXIN 400-5-5000 EX OINT
TOPICAL_OINTMENT | Freq: Once | CUTANEOUS | Status: AC
  Administered 2014-08-12: 1 via TOPICAL
  Filled 2014-08-12: qty 1

## 2014-08-12 MED ORDER — IBUPROFEN 400 MG PO TABS
600.0000 mg | ORAL_TABLET | Freq: Once | ORAL | Status: AC
Start: 2014-08-12 — End: 2014-08-12
  Administered 2014-08-12: 600 mg via ORAL
  Filled 2014-08-12: qty 2

## 2014-08-12 MED ORDER — HYDROCODONE-ACETAMINOPHEN 5-325 MG PO TABS
1.0000 | ORAL_TABLET | Freq: Once | ORAL | Status: AC
  Administered 2014-08-12: 1 via ORAL
  Filled 2014-08-12: qty 1

## 2014-08-12 MED ORDER — NAPROXEN 500 MG PO TABS: 500.0000 mg | ORAL_TABLET | Freq: Two times a day (BID) | ORAL | Status: DC

## 2014-08-12 NOTE — Discharge Instructions (Signed)
Do not take the narcotic if driving as it will make you sleepy. Return as needed for worsening symptoms.

## 2014-08-12 NOTE — ED Notes (Signed)
Pt states she was on the lawn mower and it tipped over, and ran over her leg. Abrasion and bruising to right medial lower leg with tenderness to the very top of the foot as well. This occurred ~ 2 hours ago

## 2014-08-12 NOTE — ED Notes (Signed)
Ice pack applied on arrival to room.

## 2014-08-12 NOTE — ED Notes (Signed)
Wound cleaned with saf-cleans. 4x4 and kling applied. ASO applied. CSM intact after application of ASO. Crutches given. Pt able to ambulate well using them, as she has used them in the past.

## 2014-08-12 NOTE — ED Provider Notes (Signed)
CSN: [id number]     Arrival date & time 08/12/14  1638 History   First MD Initiated Contact with Patient 08/12/14 1712     Chief Complaint  Patient presents with  . Foot Injury     (Consider location/radiation/quality/duration/timing/severity/associated sxs/prior Treatment) Patient is a 59 y.o. female presenting with foot injury. The history is provided by the patient.  Foot Injury Location:  Leg and foot Time since incident:  2 hours Injury: yes   Mechanism of injury comment:  Lawn mower accident Leg location:  R lower leg Foot location:  R foot Pain details:    Quality:  Throbbing and shooting   Severity:  Moderate   Onset quality:  Sudden   Timing:  Constant   Progression:  Worsening Chronicity:  New Dislocation: no   Foreign body present:  No foreign bodies Prior injury to area:  No Relieved by:  Nothing Worsened by:  Bearing weight, flexion and extension Ineffective treatments:  None tried Associated symptoms: swelling   Associated symptoms: no fever  Decreased active range of motion: due to pain.    ALYZE LAUF is a 59 y.o. female who presents to the ED with right lower leg and foot pain that started today after she was ridding a Conservation officer, nature on a steep hill and it tipped over and fell on her right leg and foot. She complains of pain, swelling, abrasions, and bruising to the right lower leg, foot and ankle. She denies head injury or LOC.   Past Medical History  Diagnosis Date  . Hypertension   . Seizure   . Asthma   . Pneumonia   . GERD (gastroesophageal reflux disease)   . Headache(784.0)   . Bursitis of left hip     right   Past Surgical History  Procedure Laterality Date  . Abdominal hysterectomy    . Cholecystectomy    . Appendectomy    . Cataract extraction Bilateral   . Pars plana vitrectomy Right 08/27/2013    Procedure: PARS PLANA VITRECTOMY WITH 25 GAUGE;  Surgeon: Hayden Pedro, MD;  Location: [place];  Service: Ophthalmology;  Laterality: Right;    . Membrane peel Right 08/27/2013    Procedure: MEMBRANE PEEL;  Surgeon: Hayden Pedro, MD;  Location: [place];  Service: Ophthalmology;  Laterality: Right;  . Laser photo ablation Right 08/27/2013    Procedure: LASER PHOTO ABLATION;  Surgeon: Hayden Pedro, MD;  Location: [place];  Service: Ophthalmology;  Laterality: Right;  Headscope laser AND Endolaser   Family History  Problem Relation Age of Onset  . Diabetes Father   . Hypertension Father   . Hyperlipidemia Father    History  Substance Use Topics  . Smoking status: Former Smoker -- 1.00 packs/day for 42 years    Types: Cigarettes    Quit date: 03/20/2014  . Smokeless tobacco: Never Used  . Alcohol Use: No   OB History    Gravida Para Term Preterm AB TAB SAB Ectopic Multiple Living   3 3 3       3      Review of Systems  Constitutional: Negative for fever and chills.  HENT: Negative.   Eyes: Negative for visual disturbance.  Respiratory: Negative for shortness of breath.   Gastrointestinal: Negative for nausea, vomiting and abdominal pain.  Genitourinary:       No loss of control of bladder or bowels.   Musculoskeletal: Positive for joint swelling.       Abrasions to right  lower leg, right ankle pain, right foot pain.   Skin: Positive for wound.  Neurological: Negative for headaches.  Psychiatric/Behavioral: Negative for confusion. The patient is not nervous/anxious.       Allergies  Bee venom; Amoxicillin-pot clavulanate; and Doxycycline hyclate  Home Medications   Prior to Admission medications   Medication Sig Start Date End Date Taking? Authorizing Provider  albuterol (PROVENTIL HFA;VENTOLIN HFA) 108 (90 BASE) MCG/ACT inhaler Inhale 2 puffs into the lungs every 6 (six) hours as needed for wheezing or shortness of breath. 02/01/14   Chipper Herb, MD  amLODipine (NORVASC) 5 MG tablet Take 1 tablet (5 mg total) by mouth daily. 07/01/14   Wardell Honour, MD  cetirizine (ZYRTEC) 10 MG tablet Take 1 tablet (10  mg total) by mouth daily. 06/01/14   Tiffany A Gann, PA-C  fluticasone (FLONASE) 50 MCG/ACT nasal spray Place 2 sprays into both nostrils daily. 06/01/14   Tiffany A Gann, PA-C  HYDROcodone-acetaminophen (NORCO/VICODIN) 5-325 MG per tablet Take 1 tablet by mouth every 4 (four) hours as needed. 08/12/14   Hope Bunnie Pion, NP  naproxen (NAPROSYN) 500 MG tablet Take 1 tablet (500 mg total) by mouth 2 (two) times daily. 08/12/14   Hope Bunnie Pion, NP  omeprazole (PRILOSEC OTC) 20 MG tablet Take 20 mg by mouth every evening.    Historical Provider, MD   BP 137/74 mmHg  Pulse 66  Temp(Src) 97.9 F (36.6 C) (Oral)  Resp 16  Ht 5\' 6"  (1.676 m)  Wt 190 lb (86.183 kg)  BMI 30.68 kg/m2  SpO2 98% Physical Exam  Constitutional: She is oriented to person, place, and time. She appears well-developed and well-nourished. No distress.  HENT:  Head: Normocephalic and atraumatic.  Right Ear: Tympanic membrane normal.  Left Ear: Tympanic membrane normal.  Nose: Nose normal.  Mouth/Throat: Uvula is midline, oropharynx is clear and moist and mucous membranes are normal.  Eyes: Conjunctivae and EOM are normal. Pupils are equal, round, and reactive to light.  Neck: Normal range of motion. Neck supple.  Cardiovascular: Normal rate and regular rhythm.   Pulmonary/Chest: Effort normal and breath sounds normal. No respiratory distress.  Abdominal: Soft. Bowel sounds are normal. There is no tenderness.  Musculoskeletal:       Right lower leg: She exhibits tenderness and swelling. Lacerations: abrasions.       Legs:      Right foot: There is tenderness and swelling.       Feet:  Pedal pulses 2+ bilateral, adequate circulation, good touch sensation. Pain to the ankle with range of motion. No achillis defect.    Neurological: She is alert and oriented to person, place, and time. No cranial nerve deficit.  Skin: Skin is warm and dry.  Psychiatric: She has a normal mood and affect. Her behavior is normal.  Nursing note and  vitals reviewed.   ED Course  Procedures (including critical care time) Labs Review Labs Reviewed - No data to display  Imaging Review Dg Tibia/fibula Right  08/12/2014   CLINICAL DATA:  Tipped over riding lawn mower. Mower landed on right lower extremity  EXAM: RIGHT TIBIA AND FIBULA - 2 VIEW  COMPARISON:  08/12/2014  FINDINGS: There is no evidence of fracture or other focal bone lesions. Soft tissues swelling along the medial aspect of the left lower leg noted.  IMPRESSION: No acute bone abnormality.  Soft tissue swelling.   Electronically Signed   By: Kerby Moors M.D.   On: 08/12/2014 18:11  Dg Ankle Complete Right  08/12/2014   CLINICAL DATA:  [employer] landed on leg  EXAM: RIGHT ANKLE - COMPLETE 3+ VIEW  COMPARISON:  None.  FINDINGS: Plantar and posterior calcaneal heel spurs noted. No acute fracture or subluxation noted. Soft tissue swelling along the medial aspect of the left lower leg it noted.  IMPRESSION: 1. No acute findings. 2. Medial soft tissue swelling.   Electronically Signed   By: Kerby Moors M.D.   On: 08/12/2014 18:13   Dg Foot Complete Right  08/12/2014   CLINICAL DATA:  Lawnmower accident with crush injury to right lower extremity, initial encounter  EXAM: RIGHT FOOT COMPLETE - 3+ VIEW  COMPARISON:  None.  FINDINGS: No acute bony abnormality is noted. Mild calcaneal spurs are noted. Degenerative changes in the tarsal bones are seen. No soft tissue abnormality is noted.  IMPRESSION: No acute abnormality seen.   Electronically Signed   By: Inez Catalina M.D.   On: 08/12/2014 18:12   Wound care, x-ray, tetanus updated, ASO, crutches, ice and pain management.   MDM  59 y.o. female with pain, swelling, abrasions and bruising to the right lower leg, ankle and foot after a lawn mower turned over on her right lower leg. Stable for d/c without neurovascular compromise. I have reviewed this patient's vital signs, nurses notes, appropriate imaging and discussed findings with the  patient and plan of care. She voices understanding and agrees with plan. She will return as needed for any problems.     Final diagnoses:  MVC (motor vehicle collision)  Ankle sprain, right, initial encounter  Abrasion of right leg, initial encounter  Contusion of right lower leg, initial encounter     Ashley Murrain, NP 08/12/14 1846  Orpah Greek, MD 08/12/14 [phone]

## 2014-08-31 ENCOUNTER — Ambulatory Visit: Payer: 59 | Admitting: Family

## 2014-09-07 ENCOUNTER — Encounter: Payer: Self-pay | Admitting: Physician Assistant

## 2014-09-07 ENCOUNTER — Ambulatory Visit (INDEPENDENT_AMBULATORY_CARE_PROVIDER_SITE_OTHER): Payer: 59 | Admitting: Physician Assistant

## 2014-09-07 VITALS — BP 132/78 | HR 62 | Temp 97.6°F | Ht 66.0 in | Wt 192.4 lb

## 2014-09-07 DIAGNOSIS — L97919 Non-pressure chronic ulcer of unspecified part of right lower leg with unspecified severity: Secondary | ICD-10-CM | POA: Diagnosis not present

## 2014-09-07 DIAGNOSIS — S81801A Unspecified open wound, right lower leg, initial encounter: Secondary | ICD-10-CM

## 2014-09-07 DIAGNOSIS — L03115 Cellulitis of right lower limb: Secondary | ICD-10-CM | POA: Diagnosis not present

## 2014-09-07 MED ORDER — SULFAMETHOXAZOLE-TRIMETHOPRIM 800-160 MG PO TABS: 1.0000 | ORAL_TABLET | Freq: Two times a day (BID) | ORAL | Status: DC

## 2014-09-07 MED ORDER — PREDNISONE 10 MG (21) PO TBPK: ORAL_TABLET | ORAL | Status: DC

## 2014-09-07 NOTE — Progress Notes (Signed)
   Subjective:    Patient ID: Courtney Mcconnell, female    DOB: [birth year], 59 y.o.   MRN: [id number]  HPI 59 y/o female presents with wound on right leg s/p a lawn mower running over her leg 4 weeks ago. The tire ran over her and took the skin off. She went to [facility] ED. Xray negative for fracture. She was not given an antibiotic. She has been keeping neosporin on it and wrapping with non stick guaze. It has recently started draining.     Review of Systems  Constitutional: Negative.   HENT: Negative.   Skin: Positive for wound (right medial calf, draining ).  All other systems reviewed and are negative.      Objective:   Physical Exam  Skin:  7 x 4.5 cm ulcer on right medial calf. Draining serous fluid. Skin is very dry with necrotic skin in the middle of the ulcer           Assessment & Plan:  1. Ulcer of right leg, with unspecified severity  - sulfamethoxazole-trimethoprim (BACTRIM DS,SEPTRA DS) 800-160 MG per tablet; Take 1 tablet by mouth 2 (two) times daily.  Dispense: 30 tablet; Refill: 0 - Ambulatory referral to Wound Clinic - Aerobic culture - predniSONE (STERAPRED UNI-PAK 21 TAB) 10 MG (21) TBPK tablet; 6 pills PO on day 1, 5 on day 2, 4 on day 3, 3 on day 4, 2 on day 5, 1 on day 6  Dispense: 21 tablet; Refill: 0  2. Wound of right leg, initial encounter  - sulfamethoxazole-trimethoprim (BACTRIM DS,SEPTRA DS) 800-160 MG per tablet; Take 1 tablet by mouth 2 (two) times daily.  Dispense: 30 tablet; Refill: 0 - Apply unna boot; Standing - Ambulatory referral to Wound Clinic - Apply unna boot - Aerobic culture - predniSONE (STERAPRED UNI-PAK 21 TAB) 10 MG (21) TBPK tablet; 6 pills PO on day 1, 5 on day 2, 4 on day 3, 3 on day 4, 2 on day 5, 1 on day 6  Dispense: 21 tablet; Refill: 0  3. Cellulitis of right lower extremity  - predniSONE (STERAPRED UNI-PAK 21 TAB) 10 MG (21) TBPK tablet; 6 pills PO on day 1, 5 on day 2, 4 on day 3, 3 on day 4, 2 on day 5, 1 on day 6   Dispense: 21 tablet; Refill: 0 RTO 1 week if has not followed up with wound clinic by then .   Albertia Carvin A. Benjamin Stain PA-C

## 2014-09-10 LAB — AEROBIC CULTURE

## 2014-09-15 ENCOUNTER — Ambulatory Visit (INDEPENDENT_AMBULATORY_CARE_PROVIDER_SITE_OTHER): Payer: 59 | Admitting: Physician Assistant

## 2014-09-15 ENCOUNTER — Encounter: Payer: Self-pay | Admitting: Physician Assistant

## 2014-09-15 VITALS — BP 125/71 | HR 69 | Temp 97.7°F | Ht 66.0 in | Wt 192.0 lb

## 2014-09-15 DIAGNOSIS — L97912 Non-pressure chronic ulcer of unspecified part of right lower leg with fat layer exposed: Secondary | ICD-10-CM | POA: Diagnosis not present

## 2014-09-15 NOTE — Patient Instructions (Signed)
Wet 3 gauze with normal saline four times daily. Apply to wound. Put 1 dry gauze on top. Apply tape to keep in place.

## 2014-09-19 LAB — AEROBIC CULTURE

## 2014-09-20 ENCOUNTER — Ambulatory Visit (INDEPENDENT_AMBULATORY_CARE_PROVIDER_SITE_OTHER): Payer: 59 | Admitting: Physician Assistant

## 2014-09-20 ENCOUNTER — Encounter: Payer: Self-pay | Admitting: Physician Assistant

## 2014-09-20 VITALS — BP 124/67 | HR 70 | Temp 97.7°F | Ht 66.0 in | Wt 194.0 lb

## 2014-09-20 DIAGNOSIS — L97919 Non-pressure chronic ulcer of unspecified part of right lower leg with unspecified severity: Secondary | ICD-10-CM

## 2014-09-20 NOTE — Progress Notes (Signed)
Patient ID: Courtney Mcconnell, female   DOB: April 16, 1955, 59 y.o.   MRN: [id number]   59 y/o female presents for reassessment of RLE ulcer s/p getting ran over by a lawnmower tire 6 weeks ago. Unna wrap was applied 2 weeks ago with improvement and increased granulation tissue. After removing unna boot at visit last week, ulcer was debrided and we have attempted treatment with wet to dry dressings QID. On todays exam, tissue remains moist and granulation tissue is present. Good color and blood supply. Negative for drainage and necrosis.   A/P: ulcer of RLE. Unna boot applied today. Will recheck in 1 week. She has appt at wound center on August 17th. Take remaining antibiotics

## 2014-09-21 ENCOUNTER — Telehealth: Payer: Self-pay | Admitting: Diagnostic Neuroimaging

## 2014-09-21 NOTE — Telephone Encounter (Signed)
Patient's daughter Courtney Mcconnell is calling and states that her mother had another seizure on 7/11 and again today while at work.  She was taken to the ER and is now at home.  Daughter states the ER doctor gave her a 4 day Rx Ativan (3 Xday). Should she continue to take her Vimpat with this medication? Also, she has an appointment to go to the epilepsy center in [place] in November.  Can/should this appt be changed to a sooner date?  Please call daughter.  Thanks!

## 2014-09-22 ENCOUNTER — Other Ambulatory Visit: Payer: Self-pay | Admitting: Physician Assistant

## 2014-09-22 MED ORDER — LEVOFLOXACIN 750 MG PO TABS: 750.0000 mg | ORAL_TABLET | Freq: Every day | ORAL | Status: DC

## 2014-09-22 NOTE — Telephone Encounter (Signed)
Spoke with Vickii Chafe, daughter and informed her that per Dr Leta Baptist patient may take Vimpat and Ativan. Also advised her to call Epilepsy Center and try to move appointment up or get on wait list. Peggy verbalized understanding, appreciation.

## 2014-09-22 NOTE — Telephone Encounter (Signed)
Continue vimpat and ativan. See if pt can go sooner for second opinion. -VRP

## 2014-09-27 ENCOUNTER — Ambulatory Visit (INDEPENDENT_AMBULATORY_CARE_PROVIDER_SITE_OTHER): Payer: 59 | Admitting: Family Medicine

## 2014-09-27 ENCOUNTER — Ambulatory Visit: Payer: 59 | Admitting: Physician Assistant

## 2014-09-27 VITALS — BP 135/77 | HR 61 | Temp 97.4°F | Ht 66.0 in | Wt 199.2 lb

## 2014-09-27 DIAGNOSIS — L97912 Non-pressure chronic ulcer of unspecified part of right lower leg with fat layer exposed: Secondary | ICD-10-CM | POA: Diagnosis not present

## 2014-09-27 NOTE — Progress Notes (Signed)
Subjective:  Patient ID: Courtney Mcconnell, female    DOB: [birth year]  Age: 59 y.o. MRN: [id number]  CC: Follow-up   HPI Courtney Mcconnell presents for recheck of wound of the right lower leg. She has previously seen a provider in this practice for this injury. She has been referred to the wound center. She was due for recheck today. Wound center is due to take over care of this lesion in 2 days. Patient states it is still uncomfortable but she has been putting dressings on it daily and feels it is getting better. There is no increase in pain or redness or swelling around it by her report. This occurred when she fell off of a lawnmower and the wheel ran over her. Fortunately the blade had been disengaged  History Courtney Mcconnell has a past medical history of Hypertension; Seizure; Asthma; Pneumonia; GERD (gastroesophageal reflux disease); Headache(784.0); and Bursitis of left hip.   She has past surgical history that includes Abdominal hysterectomy; Cholecystectomy; Appendectomy; Cataract extraction (Bilateral); Pars plana vitrectomy (Right, 08/27/2013); Membrane peel (Right, 08/27/2013); and Laser photo ablation (Right, 08/27/2013).   Her family history includes Diabetes in her father; Hyperlipidemia in her father; Hypertension in her father.She reports that she quit smoking about 6 months ago. Her smoking use included Cigarettes. She has a 42 pack-year smoking history. She has never used smokeless tobacco. She reports that she does not drink alcohol or use illicit drugs.  Outpatient Prescriptions Prior to Visit  Medication Sig Dispense Refill  . albuterol (PROVENTIL HFA;VENTOLIN HFA) 108 (90 BASE) MCG/ACT inhaler Inhale 2 puffs into the lungs every 6 (six) hours as needed for wheezing or shortness of breath. 1 Inhaler 2  . amLODipine (NORVASC) 5 MG tablet Take 1 tablet (5 mg total) by mouth daily. 30 tablet 3  . cetirizine (ZYRTEC) 10 MG tablet Take 1 tablet (10 mg total) by mouth daily. 30 tablet 11  .  fluticasone (FLONASE) 50 MCG/ACT nasal spray Place 2 sprays into both nostrils daily. 16 g 6  . HYDROcodone-acetaminophen (NORCO/VICODIN) 5-325 MG per tablet Take 1 tablet by mouth every 4 (four) hours as needed. 15 tablet 0  . lacosamide (VIMPAT) 200 MG TABS tablet Take 200 mg by mouth 2 (two) times daily.    . naproxen (NAPROSYN) 500 MG tablet Take 1 tablet (500 mg total) by mouth 2 (two) times daily. 20 tablet 0  . omeprazole (PRILOSEC OTC) 20 MG tablet Take 20 mg by mouth every evening.    Marland Kitchen levofloxacin (LEVAQUIN) 750 MG tablet Take 1 tablet (750 mg total) by mouth daily. 7 tablet 0  . sulfamethoxazole-trimethoprim (BACTRIM DS,SEPTRA DS) 800-160 MG per tablet Take 1 tablet by mouth 2 (two) times daily. 30 tablet 0   No facility-administered medications prior to visit.    ROS Review of Systems  Constitutional: Negative for fever, chills, diaphoresis, appetite change and fatigue.  HENT: Negative for congestion, ear pain, hearing loss, postnasal drip, rhinorrhea, sore throat and trouble swallowing.   Respiratory: Negative for cough, chest tightness and shortness of breath.   Cardiovascular: Negative for chest pain and palpitations.  Gastrointestinal: Negative for abdominal pain.  Musculoskeletal: Negative for arthralgias.  Skin: Negative for rash.    Objective:  BP 135/77 mmHg  Pulse 61  Temp(Src) 97.4 F (36.3 C) (Oral)  Ht 5\' 6"  (1.676 m)  Wt 199 lb 3.2 oz (90.357 kg)  BMI 32.17 kg/m2  BP Readings from Last 3 Encounters:  09/27/14 135/77  09/20/14 124/67  09/15/14 125/71  Wt Readings from Last 3 Encounters:  09/27/14 199 lb 3.2 oz (90.357 kg)  09/20/14 194 lb (87.998 kg)  09/15/14 192 lb (87.091 kg)     Physical Exam  Constitutional: She is oriented to person, place, and time. She appears well-developed and well-nourished. No distress.  HENT:  Head: Normocephalic and atraumatic.  Mouth/Throat: Oropharynx is clear and moist.  Eyes: Conjunctivae and EOM are  normal. Pupils are equal, round, and reactive to light.  Neck: Normal range of motion. Neck supple.  Cardiovascular: Normal rate, regular rhythm and normal heart sounds.   No murmur heard. Pulmonary/Chest: Effort normal and breath sounds normal. No respiratory distress. She has no wheezes. She has no rales.  Abdominal: Soft. Bowel sounds are normal. She exhibits no distension. There is no tenderness.  Neurological: She is alert and oriented to person, place, and time. She has normal reflexes.  Skin: Skin is warm and dry.  2X4 cm lesion on the right lower leg just above the ankle. Open through the fat layer with some granulation and necrotic tissue visible.  Psychiatric: She has a normal mood and affect. Her behavior is normal. Judgment and thought content normal.    Lab Results  Component Value Date   HGBA1C 5.5 06/28/2014    Lab Results  Component Value Date   WBC 5.6 06/28/2014   HGB 12.0 04/20/2014   HCT 40.9 06/28/2014   PLT 159 04/20/2014   GLUCOSE 70 06/28/2014   ALT 7 06/28/2014   AST 13 06/28/2014   NA 140 06/28/2014   K 5.2 06/28/2014   CL 100 06/28/2014   CREATININE 0.79 06/28/2014   BUN 9 06/28/2014   CO2 29 06/28/2014   HGBA1C 5.5 06/28/2014    Dg Tibia/fibula Right  08/12/2014   CLINICAL DATA:  Tipped over riding lawn mower. Mower landed on right lower extremity  EXAM: RIGHT TIBIA AND FIBULA - 2 VIEW  COMPARISON:  08/12/2014  FINDINGS: There is no evidence of fracture or other focal bone lesions. Soft tissues swelling along the medial aspect of the left lower leg noted.  IMPRESSION: No acute bone abnormality.  Soft tissue swelling.   Electronically Signed   By: Kerby Moors M.D.   On: 08/12/2014 18:11   Dg Ankle Complete Right  08/12/2014   CLINICAL DATA:  Leary Roca mower landed on leg  EXAM: RIGHT ANKLE - COMPLETE 3+ VIEW  COMPARISON:  None.  FINDINGS: Plantar and posterior calcaneal heel spurs noted. No acute fracture or subluxation noted. Soft tissue swelling along  the medial aspect of the left lower leg it noted.  IMPRESSION: 1. No acute findings. 2. Medial soft tissue swelling.   Electronically Signed   By: Kerby Moors M.D.   On: 08/12/2014 18:13   Dg Foot Complete Right  08/12/2014   CLINICAL DATA:  Lawnmower accident with crush injury to right lower extremity, initial encounter  EXAM: RIGHT FOOT COMPLETE - 3+ VIEW  COMPARISON:  None.  FINDINGS: No acute bony abnormality is noted. Mild calcaneal spurs are noted. Degenerative changes in the tarsal bones are seen. No soft tissue abnormality is noted.  IMPRESSION: No acute abnormality seen.   Electronically Signed   By: Inez Catalina M.D.   On: 08/12/2014 18:12    Assessment & Plan:   There are no diagnoses linked to this encounter. I have discontinued Ms. Lukasik's sulfamethoxazole-trimethoprim and levofloxacin. I am also having her maintain her omeprazole, albuterol, fluticasone, cetirizine, amLODipine, HYDROcodone-acetaminophen, naproxen, and lacosamide.  No orders of the  defined types were placed in this encounter.   Wound cleansed with saline. Mupirocin & gauze applied.  Follow-up: Wound center in 2 days will assume care of this lesion.  Claretta Fraise, M.D.

## 2014-09-29 ENCOUNTER — Encounter (HOSPITAL_BASED_OUTPATIENT_CLINIC_OR_DEPARTMENT_OTHER): Payer: 59 | Attending: Surgery

## 2014-09-29 DIAGNOSIS — S81811A Laceration without foreign body, right lower leg, initial encounter: Secondary | ICD-10-CM | POA: Insufficient documentation

## 2014-09-29 DIAGNOSIS — Z87891 Personal history of nicotine dependence: Secondary | ICD-10-CM | POA: Diagnosis not present

## 2014-09-29 DIAGNOSIS — Y288XXA Contact with other sharp object, undetermined intent, initial encounter: Secondary | ICD-10-CM | POA: Insufficient documentation

## 2014-09-29 DIAGNOSIS — G40909 Epilepsy, unspecified, not intractable, without status epilepticus: Secondary | ICD-10-CM | POA: Diagnosis not present

## 2014-09-29 DIAGNOSIS — I10 Essential (primary) hypertension: Secondary | ICD-10-CM | POA: Diagnosis not present

## 2014-09-29 DIAGNOSIS — J45909 Unspecified asthma, uncomplicated: Secondary | ICD-10-CM | POA: Diagnosis not present

## 2014-10-06 DIAGNOSIS — S81811A Laceration without foreign body, right lower leg, initial encounter: Secondary | ICD-10-CM | POA: Diagnosis not present

## 2014-10-07 DIAGNOSIS — Z0271 Encounter for disability determination: Secondary | ICD-10-CM

## 2014-10-11 ENCOUNTER — Other Ambulatory Visit: Payer: Self-pay | Admitting: Surgery

## 2014-10-11 ENCOUNTER — Ambulatory Visit (HOSPITAL_COMMUNITY)
Admission: RE | Admit: 2014-10-11 | Discharge: 2014-10-11 | Disposition: A | Discharge status: Home / Self Care | Payer: 59 | Source: Ambulatory Visit | Attending: Surgery | Admitting: Surgery

## 2014-10-11 DIAGNOSIS — I10 Essential (primary) hypertension: Secondary | ICD-10-CM | POA: Insufficient documentation

## 2014-10-11 DIAGNOSIS — L97919 Non-pressure chronic ulcer of unspecified part of right lower leg with unspecified severity: Secondary | ICD-10-CM | POA: Diagnosis not present

## 2014-10-12 NOTE — Progress Notes (Signed)
Patient ID: Courtney Mcconnell, female   DOB: 1955-12-22, 59 y.o.   MRN: [id number]  40 u/o female presents for follow up of nonhealing wound on RLE, caused by a lawnmower tire running over her leg, causing a severe abrasion. Instead of doing Unna boot this past week, we tried wet to dry dressings instead.   PE reveals healing ulcer on RLE, decreasing in size. However, the amt of increased granulation tissue at last weeks visit after Unna boot application is not seen today. I feel that it would be beneficial to apply unna boot today vs wet to dry dressings.      1. Ulcer of right lower leg, with fat layer exposed  - Unna boot applied.. Patient will f/u in 1 week prior to wound center visit.  - Aerobic culture   Continue all meds RTO 1 week  Keshav Winegar A. Benjamin Stain PA-C

## 2014-10-13 DIAGNOSIS — S81811A Laceration without foreign body, right lower leg, initial encounter: Secondary | ICD-10-CM | POA: Diagnosis not present

## 2014-10-25 ENCOUNTER — Ambulatory Visit (INDEPENDENT_AMBULATORY_CARE_PROVIDER_SITE_OTHER): Payer: 59 | Admitting: Physician Assistant

## 2014-10-25 ENCOUNTER — Ambulatory Visit (INDEPENDENT_AMBULATORY_CARE_PROVIDER_SITE_OTHER): Payer: 59

## 2014-10-25 ENCOUNTER — Encounter: Payer: Self-pay | Admitting: Physician Assistant

## 2014-10-25 VITALS — BP 147/87 | HR 62 | Temp 97.5°F | Ht 66.0 in | Wt 195.0 lb

## 2014-10-25 DIAGNOSIS — J209 Acute bronchitis, unspecified: Secondary | ICD-10-CM

## 2014-10-25 DIAGNOSIS — R0781 Pleurodynia: Secondary | ICD-10-CM

## 2014-10-25 DIAGNOSIS — R062 Wheezing: Secondary | ICD-10-CM

## 2014-10-25 MED ORDER — ALBUTEROL SULFATE HFA 108 (90 BASE) MCG/ACT IN AERS: 2.0000 | INHALATION_SPRAY | Freq: Four times a day (QID) | RESPIRATORY_TRACT | Status: DC | PRN

## 2014-10-25 MED ORDER — PREDNISONE 10 MG (21) PO TBPK: ORAL_TABLET | ORAL | Status: DC

## 2014-10-25 MED ORDER — AZITHROMYCIN 250 MG PO TABS: ORAL_TABLET | ORAL | Status: DC

## 2014-10-25 NOTE — Progress Notes (Signed)
   Subjective:    Patient ID: Courtney Mcconnell, female    DOB: 10-Sep-1955, 59 y.o.   MRN: [id number]  HPI 59 y/o female presents with c/o left sided abdominal pain with associated dizziness. She had one episode of dizziness 3 days ago. This morning she woke up and felt pain in her left flank with fatigue and dizziness. Her granddaughter had a virus last week with nausea and vomiting.   Pain in left side is constant. Sore to touch. Pain is worse with breathing, deep. No alleviating factors She has a h/o asthma. Has been using her albuterol once daily. Smokes 6-7 cigarettes daily.     Review of Systems  Constitutional: Positive for fatigue. Negative for fever, chills and diaphoresis.  HENT: Negative.   Eyes: Negative.   Respiratory: Positive for cough and shortness of breath.   Cardiovascular: Negative for chest pain.  Gastrointestinal: Positive for abdominal pain (left sided ).  Genitourinary: Positive for flank pain (left side ).  Musculoskeletal: Positive for back pain (left side ).  Neurological: Negative.   Psychiatric/Behavioral: Negative.        Objective:   Physical Exam  Constitutional: She is oriented to person, place, and time. She appears well-developed and well-nourished. No distress.  Pulmonary/Chest: Effort normal. No respiratory distress. She has wheezes (left anterior and posterior LLL end inspiratory wheeze ).  Neurological: She is alert and oriented to person, place, and time.  Skin: She is not diaphoretic.  Nursing note and vitals reviewed.         Assessment & Plan:  1. Wheeze  - DG Chest 2 View - predniSONE (STERAPRED UNI-PAK 21 TAB) 10 MG (21) TBPK tablet; 6 pills PO on day 1, 5 on day 2, 4 on day 3, 3 on day 4, 2 on day 5, 1 on day 6  Dispense: 21 tablet; Refill: 0 - azithromycin (ZITHROMAX) 250 MG tablet; 2 pills on day 1, 1 pill on day 2-5  Dispense: 6 tablet; Refill: 0  2. Pleuritic chest pain  - DG Chest 2 View - predniSONE (STERAPRED UNI-PAK 21  TAB) 10 MG (21) TBPK tablet; 6 pills PO on day 1, 5 on day 2, 4 on day 3, 3 on day 4, 2 on day 5, 1 on day 6  Dispense: 21 tablet; Refill: 0 - azithromycin (ZITHROMAX) 250 MG tablet; 2 pills on day 1, 1 pill on day 2-5  Dispense: 6 tablet; Refill: 0  3. Acute bronchitis, unspecified organism  - predniSONE (STERAPRED UNI-PAK 21 TAB) 10 MG (21) TBPK tablet; 6 pills PO on day 1, 5 on day 2, 4 on day 3, 3 on day 4, 2 on day 5, 1 on day 6  Dispense: 21 tablet; Refill: 0 - azithromycin (ZITHROMAX) 250 MG tablet; 2 pills on day 1, 1 pill on day 2-5  Dispense: 6 tablet; Refill: 0  4. Wheezing  - albuterol (PROVENTIL HFA;VENTOLIN HFA) 108 (90 BASE) MCG/ACT inhaler; Inhale 2 puffs into the lungs every 6 (six) hours as needed for wheezing or shortness of breath.  Dispense: 1 Inhaler; Refill: 2   Continue all meds Labs pending Health Maintenance reviewed Diet and exercise encouraged RTO 10 days  Jamaris Biernat A. Benjamin Stain PA-C

## 2014-10-27 ENCOUNTER — Encounter (HOSPITAL_BASED_OUTPATIENT_CLINIC_OR_DEPARTMENT_OTHER): Payer: 59 | Attending: Surgery

## 2014-10-27 DIAGNOSIS — X58XXXA Exposure to other specified factors, initial encounter: Secondary | ICD-10-CM | POA: Diagnosis not present

## 2014-10-27 DIAGNOSIS — S81811A Laceration without foreign body, right lower leg, initial encounter: Secondary | ICD-10-CM | POA: Insufficient documentation

## 2014-10-27 DIAGNOSIS — I10 Essential (primary) hypertension: Secondary | ICD-10-CM | POA: Insufficient documentation

## 2014-10-27 DIAGNOSIS — Z87891 Personal history of nicotine dependence: Secondary | ICD-10-CM | POA: Insufficient documentation

## 2014-10-27 DIAGNOSIS — J449 Chronic obstructive pulmonary disease, unspecified: Secondary | ICD-10-CM | POA: Insufficient documentation

## 2014-10-27 DIAGNOSIS — J45909 Unspecified asthma, uncomplicated: Secondary | ICD-10-CM | POA: Insufficient documentation

## 2014-10-27 DIAGNOSIS — G40909 Epilepsy, unspecified, not intractable, without status epilepticus: Secondary | ICD-10-CM | POA: Diagnosis not present

## 2014-10-27 DIAGNOSIS — L97212 Non-pressure chronic ulcer of right calf with fat layer exposed: Secondary | ICD-10-CM | POA: Diagnosis not present

## 2014-11-03 DIAGNOSIS — S81811A Laceration without foreign body, right lower leg, initial encounter: Secondary | ICD-10-CM | POA: Diagnosis not present

## 2014-11-07 ENCOUNTER — Emergency Department (HOSPITAL_COMMUNITY)
Admission: EM | Admit: 2014-11-07 | Discharge: 2014-11-07 | Disposition: A | Discharge status: Home / Self Care | Payer: 59 | Attending: Emergency Medicine | Admitting: Emergency Medicine

## 2014-11-07 ENCOUNTER — Encounter (HOSPITAL_COMMUNITY): Payer: Self-pay | Admitting: *Deleted

## 2014-11-07 ENCOUNTER — Emergency Department (HOSPITAL_COMMUNITY): Payer: 59

## 2014-11-07 DIAGNOSIS — R091 Pleurisy: Secondary | ICD-10-CM

## 2014-11-07 DIAGNOSIS — Z79899 Other long term (current) drug therapy: Secondary | ICD-10-CM | POA: Diagnosis not present

## 2014-11-07 DIAGNOSIS — Z7951 Long term (current) use of inhaled steroids: Secondary | ICD-10-CM | POA: Diagnosis not present

## 2014-11-07 DIAGNOSIS — Z8701 Personal history of pneumonia (recurrent): Secondary | ICD-10-CM | POA: Diagnosis not present

## 2014-11-07 DIAGNOSIS — K219 Gastro-esophageal reflux disease without esophagitis: Secondary | ICD-10-CM | POA: Diagnosis not present

## 2014-11-07 DIAGNOSIS — R0781 Pleurodynia: Secondary | ICD-10-CM | POA: Insufficient documentation

## 2014-11-07 DIAGNOSIS — G40909 Epilepsy, unspecified, not intractable, without status epilepticus: Secondary | ICD-10-CM | POA: Diagnosis not present

## 2014-11-07 DIAGNOSIS — I10 Essential (primary) hypertension: Secondary | ICD-10-CM | POA: Diagnosis not present

## 2014-11-07 DIAGNOSIS — J45909 Unspecified asthma, uncomplicated: Secondary | ICD-10-CM | POA: Insufficient documentation

## 2014-11-07 DIAGNOSIS — R1012 Left upper quadrant pain: Secondary | ICD-10-CM | POA: Diagnosis present

## 2014-11-07 LAB — CBC WITH DIFFERENTIAL/PLATELET
BASOS PCT: 0 %
Basophils Absolute: 0 10*3/uL (ref 0.0–0.1)
Eosinophils Absolute: 0.1 10*3/uL (ref 0.0–0.7)
Eosinophils Relative: 1 %
HEMATOCRIT: 39.6 % (ref 36.0–46.0)
Hemoglobin: 13 g/dL (ref 12.0–15.0)
LYMPHS PCT: 39 %
Lymphs Abs: 2.7 10*3/uL (ref 0.7–4.0)
MCH: 31.9 pg (ref 26.0–34.0)
MCHC: 32.8 g/dL (ref 30.0–36.0)
MCV: 97.3 fL (ref 78.0–100.0)
MONO ABS: 0.7 10*3/uL (ref 0.1–1.0)
MONOS PCT: 11 %
NEUTROS ABS: 3.3 10*3/uL (ref 1.7–7.7)
Neutrophils Relative %: 49 %
Platelets: 207 10*3/uL (ref 150–400)
RBC: 4.07 MIL/uL (ref 3.87–5.11)
RDW: 13.7 % (ref 11.5–15.5)
WBC: 6.8 10*3/uL (ref 4.0–10.5)

## 2014-11-07 LAB — COMPREHENSIVE METABOLIC PANEL
ALT: 9 U/L — ABNORMAL LOW (ref 14–54)
ANION GAP: 9 (ref 5–15)
AST: 22 U/L (ref 15–41)
Albumin: 3.8 g/dL (ref 3.5–5.0)
Alkaline Phosphatase: 77 U/L (ref 38–126)
BILIRUBIN TOTAL: 0.5 mg/dL (ref 0.3–1.2)
BUN: 12 mg/dL (ref 6–20)
CO2: 25 mmol/L (ref 22–32)
Calcium: 8.8 mg/dL — ABNORMAL LOW (ref 8.9–10.3)
Chloride: 106 mmol/L (ref 101–111)
Creatinine, Ser: 0.85 mg/dL (ref 0.44–1.00)
GFR calc Af Amer: >60 mL/min (ref >60)
Glucose, Bld: 129 mg/dL — ABNORMAL HIGH (ref 65–99)
POTASSIUM: 3.6 mmol/L (ref 3.5–5.1)
Sodium: 140 mmol/L (ref 135–145)
TOTAL PROTEIN: 6.8 g/dL (ref 6.5–8.1)

## 2014-11-07 LAB — TROPONIN I

## 2014-11-07 LAB — D-DIMER, QUANTITATIVE: D-Dimer, Quant: 0.53 ug/mL-FEU — ABNORMAL HIGH (ref 0.00–0.48)

## 2014-11-07 MED ORDER — IOHEXOL 350 MG/ML SOLN
100.0000 mL | Freq: Once | INTRAVENOUS | Status: AC | PRN
  Administered 2014-11-07: 100 mL via INTRAVENOUS

## 2014-11-07 MED ORDER — IBUPROFEN 800 MG PO TABS: 800.0000 mg | ORAL_TABLET | Freq: Three times a day (TID) | ORAL | Status: DC

## 2014-11-07 MED ORDER — ONDANSETRON HCL 4 MG/2ML IJ SOLN
4.0000 mg | Freq: Once | INTRAMUSCULAR | Status: AC
  Administered 2014-11-07: 4 mg via INTRAVENOUS
  Filled 2014-11-07: qty 2

## 2014-11-07 MED ORDER — MORPHINE SULFATE (PF) 4 MG/ML IV SOLN
4.0000 mg | Freq: Once | INTRAVENOUS | Status: AC
  Administered 2014-11-07: 4 mg via INTRAVENOUS
  Filled 2014-11-07: qty 1

## 2014-11-07 NOTE — Discharge Instructions (Signed)
Pleurisy There is no evidence of heart attack, pneumonia or blood clot in the lung. Take antiinflammatory as prescribed. Follow-up with your doctor. You need a repeat CT of your chest in 1 year. Return to the ED if you develop new or worsening symptoms. Pleurisy is an inflammation and swelling of the lining of the lungs (pleura). Because of this inflammation, it hurts to breathe. It can be aggravated by coughing, laughing, or deep breathing. Pleurisy is often caused by an underlying infection or disease.  HOME CARE INSTRUCTIONS  Monitor your pleurisy for any changes. The following actions may help to alleviate any discomfort you are experiencing:  Medicine may help with pain. Only take over-the-counter or prescription medicines for pain, discomfort, or fever as directed by your health care provider.  Only take antibiotic medicine as directed. Make sure to finish it even if you start to feel better. SEEK MEDICAL CARE IF:   Your pain is not controlled with medicine or is increasing.  You have an increase in pus-like (purulent) secretions brought up with coughing. SEEK IMMEDIATE MEDICAL CARE IF:   You have blue or dark lips, fingernails, or toenails.  You are coughing up blood.  You have increased difficulty breathing.  You have continuing pain unrelieved by medicine or pain lasting more than 1 week.  You have pain that radiates into your neck, arms, or jaw.  You develop increased shortness of breath or wheezing.  You develop a fever, rash, vomiting, fainting, or other serious symptoms. MAKE SURE YOU:  Understand these instructions.   Will watch your condition.   Will get help right away if you are not doing well or get worse.  Document Released: 01/29/2005 Document Revised: 10/01/2012 Document Reviewed: 07/13/2012 [facility] Patient Information 2015 [place], [place]. This information is not intended to replace advice given to you by your health care provider. Make sure you discuss  any questions you have with your health care provider.

## 2014-11-07 NOTE — ED Notes (Signed)
Pt states LUQ pain began last night. Denies N/V/D

## 2014-11-07 NOTE — ED Provider Notes (Signed)
CSN: [id number]     Arrival date & time 11/07/14  6237 This chart was scribed for Ezequiel Essex, MD by Hilda Lias, ED Scribe. This patient was seen in room APA16A/APA16A and the patient's care was started at 9:52 AM.  Chief Complaint  Patient presents with  . Abdominal Pain      The history is provided by the patient. No language interpreter was used.   HPI Comments: Courtney Mcconnell is a 59 y.o. female who presents to the Emergency Department complaining of constant LUQ abdominal tenderness that has been present since last night. Pt states that she has had a similar pain previously when she was diagnosed with pleurisy in the same area. Pt reports being diagnosed with a respiratory infection about two weeks ago, pt received antibiotic and steroid treatment. Pt states laying on her right side makes her pain better and sitting up makes it worse. Pt denies hx of heart and lung problems, states she is breathing normally, but that it is painful to breathe. Pt denies any falls or known injuries since onset of pain. Pt reports normal appetite, normal urination and bowel movements. Pt denies cough, fever, rhinorrhea, sore throat, rash, vomiting.   Past Medical History  Diagnosis Date  . Hypertension   . Seizure   . Asthma   . Pneumonia   . GERD (gastroesophageal reflux disease)   . Headache(784.0)   . Bursitis of left hip     right   Past Surgical History  Procedure Laterality Date  . Abdominal hysterectomy    . Cholecystectomy    . Appendectomy    . Cataract extraction Bilateral   . Pars plana vitrectomy Right 08/27/2013    Procedure: PARS PLANA VITRECTOMY WITH 25 GAUGE;  Surgeon: Hayden Pedro, MD;  Location: [place];  Service: Ophthalmology;  Laterality: Right;  . Membrane peel Right 08/27/2013    Procedure: MEMBRANE PEEL;  Surgeon: Hayden Pedro, MD;  Location: [place];  Service: Ophthalmology;  Laterality: Right;  . Laser photo ablation Right 08/27/2013    Procedure: LASER PHOTO  ABLATION;  Surgeon: Hayden Pedro, MD;  Location: [place];  Service: Ophthalmology;  Laterality: Right;  Headscope laser AND Endolaser   Family History  Problem Relation Age of Onset  . Diabetes Father   . Hypertension Father   . Hyperlipidemia Father    Social History  Substance Use Topics  . Smoking status: Former Smoker -- 1.00 packs/day for 42 years    Types: Cigarettes    Quit date: 03/20/2014  . Smokeless tobacco: Never Used  . Alcohol Use: No   OB History    Gravida Para Term Preterm AB TAB SAB Ectopic Multiple Living   3 3 3       3      Review of Systems  A complete 10 system review of systems was obtained and all systems are negative except as noted in the HPI and PMH.    Allergies  Bee venom; Amoxicillin-pot clavulanate; and Doxycycline hyclate  Home Medications   Prior to Admission medications   Medication Sig Start Date End Date Taking? Authorizing Provider  albuterol (PROVENTIL HFA;VENTOLIN HFA) 108 (90 BASE) MCG/ACT inhaler Inhale 2 puffs into the lungs every 6 (six) hours as needed for wheezing or shortness of breath. 10/25/14   Tiffany A Gann, PA-C  amLODipine (NORVASC) 5 MG tablet Take 1 tablet (5 mg total) by mouth daily. 07/01/14   Wardell Honour, MD  azithromycin (ZITHROMAX) 250 MG tablet  2 pills on day 1, 1 pill on day 2-5 10/25/14   Tiffany A Gann, PA-C  cetirizine (ZYRTEC) 10 MG tablet Take 1 tablet (10 mg total) by mouth daily. 06/01/14   Tiffany A Gann, PA-C  fluticasone (FLONASE) 50 MCG/ACT nasal spray Place 2 sprays into both nostrils daily. 06/01/14   Tiffany A Gann, PA-C  lacosamide (VIMPAT) 200 MG TABS tablet Take 200 mg by mouth 2 (two) times daily.    Historical Provider, MD  omeprazole (PRILOSEC OTC) 20 MG tablet Take 20 mg by mouth every evening.    Historical Provider, MD  predniSONE (STERAPRED UNI-PAK 21 TAB) 10 MG (21) TBPK tablet 6 pills PO on day 1, 5 on day 2, 4 on day 3, 3 on day 4, 2 on day 5, 1 on day 6 10/25/14   Tiffany A Gann, PA-C    BP 146/72 mmHg  Pulse 64  Temp(Src) 98.2 F (36.8 C) (Oral)  Resp 16  Ht 5\' 7"  (1.702 m)  Wt 197 lb (89.359 kg)  BMI 30.85 kg/m2  SpO2 97% Physical Exam  Constitutional: She is oriented to person, place, and time. She appears well-developed and well-nourished. No distress.  Uncomfortable    HENT:  Head: Normocephalic and atraumatic.  Mouth/Throat: Oropharynx is clear and moist. No oropharyngeal exudate.  Eyes: Conjunctivae and EOM are normal. Pupils are equal, round, and reactive to light.  Neck: Normal range of motion. Neck supple.  No meningismus.  Cardiovascular: Normal rate, regular rhythm, normal heart sounds and intact distal pulses.   No murmur heard. Pulmonary/Chest: Effort normal and breath sounds normal. No respiratory distress.  Tenderness to left lateral ribs underneath left breast Lungs clear to auscultation bilaterally   Abdominal: Soft. There is no tenderness. There is no rebound and no guarding.  nontender   Musculoskeletal: Normal range of motion. She exhibits no edema or tenderness.  Neurological: She is alert and oriented to person, place, and time. No cranial nerve deficit. She exhibits normal muscle tone. Coordination normal.  No ataxia on finger to nose bilaterally. No pronator drift. 5/5 strength throughout. CN 2-12 intact. Negative Romberg. Equal grip strength. Sensation intact. Gait is normal.   Skin: Skin is warm. No rash noted.  Psychiatric: She has a normal mood and affect. Her behavior is normal.  Nursing note and vitals reviewed.   ED Course  Procedures (including critical care time)  DIAGNOSTIC STUDIES: Oxygen Saturation is 97% on room air, normal by my interpretation.    COORDINATION OF CARE: 9:57 AM Discussed treatment plan with pt at bedside and pt agreed to plan.   Labs Review Labs Reviewed - No data to display  Imaging Review No results found. I have personally reviewed and evaluated these images and lab results as part of my  medical decision-making.   EKG Interpretation   Date/Time:  Sunday November 07 2014 10:03:47 EDT Ventricular Rate:  64 PR Interval:  175 QRS Duration: 109 QT Interval:  427 QTC Calculation: 441 R Axis:   -20 Text Interpretation:  Sinus rhythm RSR' in V1 or V2, right VCD or RVH Left  ventricular hypertrophy No significant change was found Confirmed by  Wyvonnia Dusky  MD, [place] [phone]) on 11/07/2014 10:20:53 AM    me:  Sunday November 07 2014 10:03:47 EDT Ventricular Rate:  64 PR Interval:  175 QRS Duration: 109 QT Interval:  427 QTC Calculation: 441 R Axis:   -20 Text Interpretation:  Sinus rhythm RSR' in V1 or V2, right VCD or RVH Left  ventricular hypertrophy No significant change was found Confirmed by  Wyvonnia Dusky  MD, [facility] [phone]) on 11/07/2014 10:20:53 AM      MDM   Final diagnoses:  None   Left lower rib cage pain constant since yesterday that is worse with movement and improved with palpation. Similar to when she was diagnosed with pleurisy in the past. Saw PCP earlier this week was treated for respiratory infection.   EKG normal sinus rhythm. No rash on chest wall.  D-dimer is positive. CT is negative for pulmonary embolism. Patient informed of lung nodule and need for repeat CT scan 1 year.  Suspect pleurisy. Low suspicion for ACS, PE or pneumonia. We'll treat with anti-inflammatory for pain control. Follow-up with PCP. Return precautions discussed.  I personally performed the services described in this documentation, which was scribed in my presence. The recorded information has been reviewed and is accurate.      Ezequiel Essex, MD 11/07/14 [phone]

## 2014-11-10 DIAGNOSIS — S81811A Laceration without foreign body, right lower leg, initial encounter: Secondary | ICD-10-CM | POA: Diagnosis not present

## 2014-11-12 ENCOUNTER — Ambulatory Visit (INDEPENDENT_AMBULATORY_CARE_PROVIDER_SITE_OTHER): Payer: 59 | Admitting: Family Medicine

## 2014-11-12 ENCOUNTER — Encounter: Payer: Self-pay | Admitting: Family Medicine

## 2014-11-12 VITALS — BP 138/77 | HR 62 | Temp 98.1°F | Ht 67.0 in | Wt 199.6 lb

## 2014-11-12 DIAGNOSIS — J441 Chronic obstructive pulmonary disease with (acute) exacerbation: Secondary | ICD-10-CM | POA: Diagnosis not present

## 2014-11-12 MED ORDER — PREDNISONE 20 MG PO TABS: ORAL_TABLET | ORAL | Status: DC

## 2014-11-12 MED ORDER — AZITHROMYCIN 250 MG PO TABS: ORAL_TABLET | ORAL | Status: DC

## 2014-11-12 NOTE — Progress Notes (Signed)
BP 138/77 mmHg  Pulse 62  Temp(Src) 98.1 F (36.7 C) (Oral)  Ht 5\' 7"  (1.702 m)  Wt 199 lb 9.6 oz (90.538 kg)  BMI 31.25 kg/m2   Subjective:    Patient ID: Courtney Mcconnell, female    DOB: 1955/12/26, 59 y.o.   MRN: [id number]  HPI: Courtney Mcconnell is a 59 y.o. female presenting on 11/12/2014 for Chest congestion   HPI Wheezing and chest pain Patient has a 5 day history of cough, wheezing, shortness of breath and chest pain. The coughing is worse at night and the chest pain as a sharp stabbing pain down in her lower ribs that is worse with deep breathing and coughing. She was seen at [facility] 5 days ago and had a chest x-ray and CT scan. The chest x-ray was normal and the CT scan showed 7 to 8 mm nodule in right upper lung and recommended a repeat CT in 6-12 months. They did not find any acute pneumonia or other changes in the lung. She does have a history of smoking but does not smoke currently and has what sounds like COPD. She denies any fevers or chills and the cough has been worsening over the past couple days. The emergency room gave her ibuprofen for the pleurisy.  Relevant past medical, surgical, family and social history reviewed and updated as indicated. Interim medical history since our last visit reviewed. Allergies and medications reviewed and updated.  Review of Systems  Constitutional: Negative for fever and chills.  HENT: Positive for congestion, postnasal drip, rhinorrhea, sinus pressure, sneezing and sore throat. Negative for ear discharge and ear pain.   Eyes: Negative for pain, redness and visual disturbance.  Respiratory: Positive for cough, chest tightness, shortness of breath and wheezing.   Cardiovascular: Negative for chest pain and leg swelling.  Genitourinary: Negative for dysuria and difficulty urinating.  Musculoskeletal: Negative for back pain and gait problem.  Skin: Negative for rash.  Neurological: Negative for dizziness, light-headedness and  headaches.  Psychiatric/Behavioral: Negative for behavioral problems and agitation.  All other systems reviewed and are negative.   Per HPI unless specifically indicated above     Medication List       This list is accurate as of: 11/12/14  6:51 PM.  Always use your most recent med list.               albuterol 108 (90 BASE) MCG/ACT inhaler  Commonly known as:  PROVENTIL HFA;VENTOLIN HFA  Inhale 2 puffs into the lungs every 6 (six) hours as needed for wheezing or shortness of breath.     ALKA SELTZER PLUS PO  Take 1 tablet by mouth daily.     azithromycin 250 MG tablet  Commonly known as:  ZITHROMAX  Take 2 the first day and then one each day after.     cetirizine 10 MG tablet  Commonly known as:  ZYRTEC  Take 1 tablet (10 mg total) by mouth daily.     fluticasone 50 MCG/ACT nasal spray  Commonly known as:  FLONASE  Place 2 sprays into both nostrils daily.     ibuprofen 800 MG tablet  Commonly known as:  ADVIL,MOTRIN  Take 1 tablet (800 mg total) by mouth 3 (three) times daily.     lacosamide 200 MG Tabs tablet  Commonly known as:  VIMPAT  Take 200 mg by mouth 2 (two) times daily.     predniSONE 20 MG tablet  Commonly known as:  DELTASONE  2 po at same time daily for 5 days     SINE-OFF PO  Take 1 tablet by mouth daily as needed (sinus).     VITAMIN C PO  Take 1 tablet by mouth daily.           Objective:    BP 138/77 mmHg  Pulse 62  Temp(Src) 98.1 F (36.7 C) (Oral)  Ht 5\' 7"  (1.702 m)  Wt 199 lb 9.6 oz (90.538 kg)  BMI 31.25 kg/m2  Wt Readings from Last 3 Encounters:  11/12/14 199 lb 9.6 oz (90.538 kg)  11/07/14 197 lb (89.359 kg)  10/25/14 195 lb (88.451 kg)    Physical Exam  Constitutional: She is oriented to person, place, and time. She appears well-developed and well-nourished. No distress.  HENT:  Right Ear: Tympanic membrane, external ear and ear canal normal.  Left Ear: Tympanic membrane, external ear and ear canal normal.  Nose:  Mucosal edema and rhinorrhea present. No epistaxis. Right sinus exhibits no maxillary sinus tenderness and no frontal sinus tenderness. Left sinus exhibits no maxillary sinus tenderness and no frontal sinus tenderness.  Mouth/Throat: Uvula is midline and mucous membranes are normal. Posterior oropharyngeal edema and posterior oropharyngeal erythema present. No oropharyngeal exudate or tonsillar abscesses.  Eyes: Conjunctivae and EOM are normal.  Cardiovascular: Normal rate, regular rhythm, normal heart sounds and intact distal pulses.   No murmur heard. Pulmonary/Chest: Effort normal. No respiratory distress. She has wheezes ( minimal upper lobe wheezes bilaterally, clear with coughing). She has no rales.  Musculoskeletal: Normal range of motion. She exhibits no edema or tenderness.  Neurological: She is alert and oriented to person, place, and time. Coordination normal.  Skin: Skin is warm and dry. No rash noted. She is not diaphoretic.  Psychiatric: She has a normal mood and affect. Her behavior is normal.  Vitals reviewed.   Results for orders placed or performed during the hospital encounter of 11/07/14  CBC with Differential/Platelet  Result Value Ref Range   WBC 6.8 4.0 - 10.5 K/uL   RBC 4.07 3.87 - 5.11 MIL/uL   Hemoglobin 13.0 12.0 - 15.0 g/dL   HCT 39.6 36.0 - 46.0 %   MCV 97.3 78.0 - 100.0 fL   MCH 31.9 26.0 - 34.0 pg   MCHC 32.8 30.0 - 36.0 g/dL   RDW 13.7 11.5 - 15.5 %   Platelets 207 150 - 400 K/uL   Neutrophils Relative % 49 %   Neutro Abs 3.3 1.7 - 7.7 K/uL   Lymphocytes Relative 39 %   Lymphs Abs 2.7 0.7 - 4.0 K/uL   Monocytes Relative 11 %   Monocytes Absolute 0.7 0.1 - 1.0 K/uL   Eosinophils Relative 1 %   Eosinophils Absolute 0.1 0.0 - 0.7 K/uL   Basophils Relative 0 %   Basophils Absolute 0.0 0.0 - 0.1 K/uL  Comprehensive metabolic panel  Result Value Ref Range   Sodium 140 135 - 145 mmol/L   Potassium 3.6 3.5 - 5.1 mmol/L   Chloride 106 101 - 111 mmol/L    CO2 25 22 - 32 mmol/L   Glucose, Bld 129 (H) 65 - 99 mg/dL   BUN 12 6 - 20 mg/dL   Creatinine, Ser 0.85 0.44 - 1.00 mg/dL   Calcium 8.8 (L) 8.9 - 10.3 mg/dL   Total Protein 6.8 6.5 - 8.1 g/dL   Albumin 3.8 3.5 - 5.0 g/dL   AST 22 15 - 41 U/L   ALT 9 (L) 14 - 54  U/L   Alkaline Phosphatase 77 38 - 126 U/L   Total Bilirubin 0.5 0.3 - 1.2 mg/dL   GFR calc non Af Amer >60 >60 mL/min   GFR calc Af Amer >60 >60 mL/min   Anion gap 9 5 - 15  Troponin I  Result Value Ref Range   Troponin I <0.03 <0.031 ng/mL  D-dimer, quantitative (not at [facility])  Result Value Ref Range   D-Dimer, Quant 0.53 (H) 0.00 - 0.48 ug/mL-FEU      Assessment & Plan:   Problem List Items Addressed This Visit    None    Visit Diagnoses    COPD exacerbation    -  Primary    Patient has pleurisy but also has cough and wheeze and worsening cough at night. She is using her albuterol, will give Zithromax and prednisone.    Relevant Medications    azithromycin (ZITHROMAX) 250 MG tablet    predniSONE (DELTASONE) 20 MG tablet        Follow up plan: Return in about 4 weeks (around 12/10/2014), or if symptoms worsen or fail to improve, for Follow-up COPD.  Caryl Pina, MD [facility] 11/12/2014, 6:51 PM

## 2014-11-15 ENCOUNTER — Other Ambulatory Visit: Payer: Self-pay | Admitting: Diagnostic Neuroimaging

## 2014-11-15 NOTE — Telephone Encounter (Signed)
Requesting refill to last until they can be seen at [facility].  Thank you.

## 2014-11-17 ENCOUNTER — Encounter (HOSPITAL_BASED_OUTPATIENT_CLINIC_OR_DEPARTMENT_OTHER): Payer: 59 | Attending: Surgery

## 2014-11-17 DIAGNOSIS — J45909 Unspecified asthma, uncomplicated: Secondary | ICD-10-CM | POA: Insufficient documentation

## 2014-11-17 DIAGNOSIS — Z87891 Personal history of nicotine dependence: Secondary | ICD-10-CM | POA: Diagnosis not present

## 2014-11-17 DIAGNOSIS — J449 Chronic obstructive pulmonary disease, unspecified: Secondary | ICD-10-CM | POA: Diagnosis not present

## 2014-11-17 DIAGNOSIS — L97811 Non-pressure chronic ulcer of other part of right lower leg limited to breakdown of skin: Secondary | ICD-10-CM | POA: Diagnosis present

## 2014-11-17 DIAGNOSIS — I10 Essential (primary) hypertension: Secondary | ICD-10-CM | POA: Insufficient documentation

## 2014-11-24 DIAGNOSIS — L97811 Non-pressure chronic ulcer of other part of right lower leg limited to breakdown of skin: Secondary | ICD-10-CM | POA: Diagnosis not present

## 2014-12-13 DIAGNOSIS — G40109 Localization-related (focal) (partial) symptomatic epilepsy and epileptic syndromes with simple partial seizures, not intractable, without status epilepticus: Secondary | ICD-10-CM

## 2014-12-13 DIAGNOSIS — G40209 Localization-related (focal) (partial) symptomatic epilepsy and epileptic syndromes with complex partial seizures, not intractable, without status epilepticus: Secondary | ICD-10-CM | POA: Insufficient documentation

## 2014-12-22 ENCOUNTER — Telehealth: Payer: Self-pay | Admitting: Family Medicine

## 2014-12-24 NOTE — Telephone Encounter (Signed)
[facility] with referral authorization

## 2014-12-29 ENCOUNTER — Ambulatory Visit: Payer: 59 | Admitting: Diagnostic Neuroimaging

## 2015-01-21 ENCOUNTER — Encounter: Payer: Self-pay | Admitting: Diagnostic Neuroimaging

## 2015-01-21 ENCOUNTER — Telehealth: Payer: Self-pay | Admitting: *Deleted

## 2015-01-21 ENCOUNTER — Ambulatory Visit (INDEPENDENT_AMBULATORY_CARE_PROVIDER_SITE_OTHER): Payer: 59 | Admitting: Diagnostic Neuroimaging

## 2015-01-21 VITALS — BP 133/86 | HR 85 | Ht 66.0 in | Wt 207.0 lb

## 2015-01-21 DIAGNOSIS — G40909 Epilepsy, unspecified, not intractable, without status epilepticus: Secondary | ICD-10-CM | POA: Diagnosis not present

## 2015-01-21 MED ORDER — LACOSAMIDE 100 MG PO TABS: 100.0000 mg | ORAL_TABLET | Freq: Every day | ORAL | Status: DC

## 2015-01-21 MED ORDER — LACOSAMIDE 200 MG PO TABS: 200.0000 mg | ORAL_TABLET | Freq: Two times a day (BID) | ORAL | Status: DC

## 2015-01-21 NOTE — Progress Notes (Signed)
[facility]  PATIENT: Courtney Mcconnell DOB: 1956/01/26  REFERRING CLINICIAN:  HISTORY FROM: patient, son REASON FOR VISIT: follow up   HISTORICAL  CHIEF COMPLAINT:  Chief Complaint  Patient presents with  . Seizure Disorder    rm 6, sonJacqulynn Cadet, last sz 12/12/14  . Follow-up    6 month    HISTORY OF PRESENT ILLNESS:   UPDATE 01/21/15: Since last visit, was continuing with sz 1 per month. Last sz Dec 12, 2014. Then saw Dr. Ginny Forth on 12/13/14, who increased vimpat to 200/300. Since then, no further sz.   UPDATE 06/28/14: Stopped vimpat on 06/21/14. She was concerned about dizziness, chest pain, blurred vision; but these have been going on from before vimpat to some extent, and have continued since stopped vimpat. BP is elevated today.   UPDATE 05/11/14: Since last visit, had seizures in Feb 2016 (x2), 04/17/14 (1x), 04/18/14 (3x), 05/10/14 (1x) and 05/11/14 (2am, 3am). Went to hosp on 04/18/14 due to freq sz and facial drooping. Had CT head and labs, and DPH was changed to vimpat, then d/c home. Did well until last night and this AM (1 sz last night and 2 sz this AM). All sz are noctural in her sleep. Some soreness and tired feeling today. Tolerating vimpat.   UPDATE 01/05/14: Since last visit, has had 4 seizures. 2 seizures last Sunday, separated by 3 hours. Lips turned blue. Patient declined hospital visit. Taking dilantin 300mg  at bedtime. Had URI symptoms the week prior to seizures.   UPDATE 06/29/13: Since last visit, had breakthrough seizure in April 2015 and another one last Thursday (06/25/13). She had been having cough, dx'd with URI and rx'd medrol + amoxicillin. Seizure occurred at 2am, in her sleep. She was found by family in the bathroom, on the floor, convulsing, bruised, eyes barely open. Lasted ~ 10-20 min. Then post ictal snoring 15-20 min, then confusion and wandering around the home for next 1 hour. Admits that she has been missing doses of LEV XR 500mg  daily  because of cost ($86/month) and side effects (agitation, mood, depression).   UPDATE 12/11/12: Since last visit patient doing well and having no further seizures. She's tolerating levetiracetam XR 500 mg at bedtime without side effects. Patient is under more stress as her husband has been recently diagnosed with cancer.   PRIOR HPI (09/03/12): 59 year old female here for seizure disorder evaluation. Patient is accompanied by 3 family members. She has no recollection of the 2 episodes. Family member explains that first episode occurred in March 2014, she awoke at 5:00am, convulsions started in bed, husband could not wake her up. Convulsions lasted about 30 minutes, husband put wet rag on her face and when it stopped and she started to wake up. There was no tongue-biting or incontinence of bowel or bladder. They did not seek medical care. Another event happened in the morning on 08/06/12, family reports that patient got up as usual in the morning, went to the bathroom, went to he kitchen, got a cigarette and sat at the kitchen table. Then she developed staring and then convulsions, lasting about 15 minutes, afterward she was taken to [facility] ED by family. She was started on LEV 250 mg BID at ED discharge, CT head was negative. Patient denies any history of stroke or family history of seizure. Patient reports she is only taking LEV 250 mg at night because taking it twice a day made her feel like "she wasn't in the world". Has  had occasional olfactory hallucinations of vinegar, and has had some deja vu episodes. Current every day smoker.    REVIEW OF SYSTEMS: Full 14 system review of systems performed and notable only as per HPI.   ALLERGIES: Allergies  Allergen Reactions  . Bee Venom Swelling  . Amoxicillin-Pot Clavulanate Rash  . Doxycycline Hyclate Rash    Rash and itching    HOME MEDICATIONS: Outpatient Prescriptions Prior to Visit  Medication Sig Dispense Refill  . albuterol (PROVENTIL  HFA;VENTOLIN HFA) 108 (90 BASE) MCG/ACT inhaler Inhale 2 puffs into the lungs every 6 (six) hours as needed for wheezing or shortness of breath. 1 Inhaler 2  . Ascorbic Acid (VITAMIN C PO) Take 1 tablet by mouth daily.    Marland Kitchen azithromycin (ZITHROMAX) 250 MG tablet Take 2 the first day and then one each day after. 6 tablet 0  . cetirizine (ZYRTEC) 10 MG tablet Take 1 tablet (10 mg total) by mouth daily. 30 tablet 11  . Chlorphen-Pseudoephed-APAP (SINE-OFF PO) Take 1 tablet by mouth daily as needed (sinus).    . fluticasone (FLONASE) 50 MCG/ACT nasal spray Place 2 sprays into both nostrils daily. 16 g 6  . ibuprofen (ADVIL,MOTRIN) 800 MG tablet Take 1 tablet (800 mg total) by mouth 3 (three) times daily. 21 tablet 0  . VIMPAT 200 MG TABS tablet TAKE ONE TABLET BY MOUTH TWICE DAILY 60 tablet 1  . Phenyleph-Doxylamine-DM-APAP (ALKA SELTZER PLUS PO) Take 1 tablet by mouth daily.    . predniSONE (DELTASONE) 20 MG tablet 2 po at same time daily for 5 days 10 tablet 0   No facility-administered medications prior to visit.    PAST MEDICAL HISTORY: Past Medical History  Diagnosis Date  . Hypertension   . Asthma   . Pneumonia   . GERD (gastroesophageal reflux disease)   . Headache(784.0)   . Bursitis of left hip     right  . Seizure ([employer])     most recent 12/12/14    PAST SURGICAL HISTORY: Past Surgical History  Procedure Laterality Date  . Abdominal hysterectomy    . Cholecystectomy    . Appendectomy    . Cataract extraction Bilateral   . Pars plana vitrectomy Right 08/27/2013    Procedure: PARS PLANA VITRECTOMY WITH 25 GAUGE;  Surgeon: Hayden Pedro, MD;  Location: [place];  Service: Ophthalmology;  Laterality: Right;  . Membrane peel Right 08/27/2013    Procedure: MEMBRANE PEEL;  Surgeon: Hayden Pedro, MD;  Location: [place];  Service: Ophthalmology;  Laterality: Right;  . Laser photo ablation Right 08/27/2013    Procedure: LASER PHOTO ABLATION;  Surgeon: Hayden Pedro, MD;  Location: [place];  Service: Ophthalmology;  Laterality: Right;  Headscope laser AND Endolaser    FAMILY HISTORY: Family History  Problem Relation Age of Onset  . Diabetes Father   . Hypertension Father   . Hyperlipidemia Father     SOCIAL HISTORY:  Social History   Social History  . Marital Status: Married    Spouse Name: [name]  . Number of Children: 2  . Years of Education: GED   Occupational History  . [employer]    Social History Main Topics  . Smoking status: Current Every Day Smoker -- 1.00 packs/day for 42 years    Types: Cigarettes    Last Attempt to Quit: 03/20/2014  . Smokeless tobacco: Never Used     Comment: 01/21/15 restarted smoking 4 mos ago  . Alcohol Use: No  . Drug  Use: No  . Sexual Activity: Not on file   Other Topics Concern  . Not on file   Social History Narrative   Patient lives at home with family.   Caffeine Use: 1 pot and a half a day     PHYSICAL EXAM  Filed Vitals:   01/21/15 1127  BP: 133/86  Pulse: 85  Height: 5\' 6"  (1.676 m)  Weight: 207 lb (93.895 kg)    Not recorded      Body mass index is 33.43 kg/(m^2).  GENERAL EXAM: Patient is in no distress; well developed, nourished and groomed; neck is supple; SEVERE CIGARETTE SMOKE SMELL IN ROOM  CARDIOVASCULAR: Regular rate and rhythm, no murmurs, no carotid bruits  NEUROLOGIC: MENTAL STATUS: awake, alert, language fluent, comprehension intact, naming intact, fund of knowledge appropriate CRANIAL NERVE: pupils equal and reactive to light, visual fields full to confrontation, extraocular muscles intact, no nystagmus, facial sensation and strength symmetric, hearing intact, palate elevates symmetrically, uvula midline, shoulder shrug symmetric, tongue midline. MOTOR: normal bulk and tone, full strength in the BUE, BLE SENSORY: normal and symmetric to light touch  COORDINATION: finger-nose-finger, fine finger movements normal REFLEXES: deep tendon reflexes present and symmetric GAIT/STATION:  narrow based gait   DIAGNOSTIC DATA (LABS, IMAGING, TESTING) - I reviewed patient records, labs, notes, testing and imaging myself where available.  Lab Results  Component Value Date   WBC 6.8 11/07/2014   HGB 13.0 11/07/2014   HCT 39.6 11/07/2014   MCV 97.3 11/07/2014   PLT 207 11/07/2014      Component Value Date/Time   NA 140 11/07/2014 1003   NA 140 06/28/2014 1136   K 3.6 11/07/2014 1003   CL 106 11/07/2014 1003   CO2 25 11/07/2014 1003   GLUCOSE 129* 11/07/2014 1003   GLUCOSE 70 06/28/2014 1136   BUN 12 11/07/2014 1003   BUN 9 06/28/2014 1136   CREATININE 0.85 11/07/2014 1003   CALCIUM 8.8* 11/07/2014 1003   PROT 6.8 11/07/2014 1003   PROT 6.6 06/28/2014 1136   ALBUMIN 3.8 11/07/2014 1003   ALBUMIN 4.3 06/28/2014 1136   AST 22 11/07/2014 1003   ALT 9* 11/07/2014 1003   ALKPHOS 77 11/07/2014 1003   BILITOT 0.5 11/07/2014 1003   BILITOT <0.2 06/28/2014 1136   GFRNONAA >60 11/07/2014 1003   GFRAA >60 11/07/2014 1003   No results found for: CHOL Lab Results  Component Value Date   HGBA1C 5.5 06/28/2014   No results found for: VITAMINB12 No results found for: TSH   08/06/12 CT head - no acute findings   09/17/12 MRI brain - few scattered periventricular and subcortical and pontine chronic small vessel ischemic disease. No acute findings.   09/10/12 EEG - normal  04/18/14 CT head - negative    ASSESSMENT AND PLAN  59 y.o. year old female here with with multiple episodes of nocturnal, witnessed convulsions, suspicious for generalized seizure disorder. Previously couldn't tolerate levetiracetam (cost and side effects). DPH was tried but levels always low. Was on vimpat, now she changed back to dilantin on her own. Last event this AM (Jun 14, 2014).   PLAN:  1. Continue vimpat 200mg  in AM and 300mg  in PM 2. No driving until seizure free x 6 months (last sz 12/12/14) 3. Follow up with Dr. Ginny Forth [facility] epilepsy) for further mgmt/evaluation of intractable sz  d/o  Meds ordered this encounter  Medications  . lacosamide (VIMPAT) 200 MG TABS tablet    Sig: Take 1 tablet (200  mg total) by mouth 2 (two) times daily.    Dispense:  60 tablet    Refill:  5    Pharmacy Fax [phone]  . Lacosamide 100 MG TABS    Sig: Take 1 tablet (100 mg total) by mouth daily.    Dispense:  30 tablet    Refill:  5   Return if symptoms worsen or fail to improve, for follow up with Dr. Ginny Forth for now.    Penni Bombard, MD [id number], [id number] AM Certified in Neurology, Neurophysiology and Neuroimaging  [facility] [address] [place],  [address] [phone]

## 2015-01-21 NOTE — Patient Instructions (Signed)

## 2015-01-21 NOTE — Telephone Encounter (Signed)
Form, Fmla, Wieland Copper  Received from [place] to [employer] [facility] and Dr Leta Baptist 01/21/15.

## 2015-01-24 DIAGNOSIS — Z0289 Encounter for other administrative examinations: Secondary | ICD-10-CM

## 2015-01-24 DIAGNOSIS — Z0271 Encounter for disability determination: Secondary | ICD-10-CM

## 2015-01-25 ENCOUNTER — Encounter: Payer: Self-pay | Admitting: Family Medicine

## 2015-01-25 ENCOUNTER — Ambulatory Visit (INDEPENDENT_AMBULATORY_CARE_PROVIDER_SITE_OTHER): Payer: 59 | Admitting: Family Medicine

## 2015-01-25 VITALS — BP 138/81 | HR 62 | Temp 98.5°F | Ht 66.0 in | Wt 207.0 lb

## 2015-01-25 DIAGNOSIS — Z124 Encounter for screening for malignant neoplasm of cervix: Secondary | ICD-10-CM | POA: Diagnosis not present

## 2015-01-25 DIAGNOSIS — Z1211 Encounter for screening for malignant neoplasm of colon: Secondary | ICD-10-CM

## 2015-01-25 DIAGNOSIS — J441 Chronic obstructive pulmonary disease with (acute) exacerbation: Secondary | ICD-10-CM | POA: Insufficient documentation

## 2015-01-25 DIAGNOSIS — F17200 Nicotine dependence, unspecified, uncomplicated: Secondary | ICD-10-CM

## 2015-01-25 MED ORDER — PREDNISONE 20 MG PO TABS: ORAL_TABLET | ORAL | Status: DC

## 2015-01-25 MED ORDER — AZITHROMYCIN 250 MG PO TABS
ORAL_TABLET | ORAL | Status: DC
Start: 2015-01-25 — End: 2015-03-02

## 2015-01-25 MED ORDER — CEFTRIAXONE SODIUM 1 G IJ SOLR
1.0000 g | INTRAMUSCULAR | Status: AC
  Administered 2015-01-25: 1 g via INTRAMUSCULAR

## 2015-01-25 NOTE — Progress Notes (Signed)
BP 138/81 mmHg  Pulse 62  Temp(Src) 98.5 F (36.9 C) (Oral)  Ht 5\' 6"  (1.676 m)  Wt 207 lb (93.895 kg)  BMI 33.43 kg/m2   Subjective:    Patient ID: Courtney Mcconnell, female    DOB: October 11, 1955, 59 y.o.   MRN: [id number]  HPI: Courtney Mcconnell is a 59 y.o. female presenting on 01/25/2015 for Chest congestion; Cough; and Shortness of Breath   HPI Cough and chest congestion and wheezing Patient has had a cough and chest congestion and wheezing and shortness of breath and chest tightness that is been worsening over the past few days. She has been using her albuterol inhaler and her Flonase with some success. She denies any fevers or chills. Her cough is nonproductive at this point. She does have some sinus pressure and postnasal drainage as well. She quit smoking for 4 months earlier in the year but now is smoking 1 pack per day again. She would like to quit smoking and will try on her own to use patches or gum. Because of her seizure history and being on seizure medication she cannot try Wellbutrin or Chantix.  Relevant past medical, surgical, family and social history reviewed and updated as indicated. Interim medical history since our last visit reviewed. Allergies and medications reviewed and updated.  Review of Systems  Constitutional: Negative for fever and chills.  HENT: Positive for congestion, postnasal drip, rhinorrhea, sinus pressure and sore throat. Negative for ear discharge, ear pain and sneezing.   Eyes: Negative for pain, redness and visual disturbance.  Respiratory: Positive for cough, chest tightness and wheezing. Negative for shortness of breath.   Cardiovascular: Negative for chest pain and leg swelling.  Genitourinary: Negative for dysuria and difficulty urinating.  Musculoskeletal: Negative for back pain and gait problem.  Skin: Negative for rash.  Neurological: Negative for light-headedness and headaches.  Psychiatric/Behavioral: Negative for behavioral problems and  agitation.  All other systems reviewed and are negative.   Per HPI unless specifically indicated above     Medication List       This list is accurate as of: 01/25/15  2:32 PM.  Always use your most recent med list.               albuterol 108 (90 BASE) MCG/ACT inhaler  Commonly known as:  PROVENTIL HFA;VENTOLIN HFA  Inhale 2 puffs into the lungs every 6 (six) hours as needed for wheezing or shortness of breath.     azithromycin 250 MG tablet  Commonly known as:  ZITHROMAX  Take 2 the first day and then one each day after.     cetirizine 10 MG tablet  Commonly known as:  ZYRTEC  Take 1 tablet (10 mg total) by mouth daily.     fluticasone 50 MCG/ACT nasal spray  Commonly known as:  FLONASE  Place 2 sprays into both nostrils daily.     lacosamide 200 MG Tabs tablet  Commonly known as:  VIMPAT  Take 1 tablet (200 mg total) by mouth 2 (two) times daily.     Lacosamide 100 MG Tabs  Take 1 tablet (100 mg total) by mouth daily.     predniSONE 20 MG tablet  Commonly known as:  DELTASONE  2 po at same time daily for 5 days     SINE-OFF PO  Take 1 tablet by mouth daily as needed (sinus).     VITAMIN C PO  Take 1 tablet by mouth daily.  Objective:    BP 138/81 mmHg  Pulse 62  Temp(Src) 98.5 F (36.9 C) (Oral)  Ht 5\' 6"  (1.676 m)  Wt 207 lb (93.895 kg)  BMI 33.43 kg/m2  Wt Readings from Last 3 Encounters:  01/25/15 207 lb (93.895 kg)  01/21/15 207 lb (93.895 kg)  11/12/14 199 lb 9.6 oz (90.538 kg)    Physical Exam  Constitutional: She is oriented to person, place, and time. She appears well-developed and well-nourished. No distress.  HENT:  Right Ear: Tympanic membrane, external ear and ear canal normal.  Left Ear: Tympanic membrane, external ear and ear canal normal.  Nose: Mucosal edema and rhinorrhea present. No epistaxis. Right sinus exhibits no maxillary sinus tenderness and no frontal sinus tenderness. Left sinus exhibits no maxillary sinus  tenderness and no frontal sinus tenderness.  Mouth/Throat: Uvula is midline and mucous membranes are normal. Posterior oropharyngeal edema and posterior oropharyngeal erythema present. No oropharyngeal exudate or tonsillar abscesses.  Eyes: Conjunctivae and EOM are normal.  Neck: Neck supple. No thyromegaly present.  Cardiovascular: Normal rate, regular rhythm, normal heart sounds and intact distal pulses.   No murmur heard. Pulmonary/Chest: Effort normal. No respiratory distress. She has wheezes (trace end expiratory wheeze). She has no rales. She exhibits no tenderness.  Musculoskeletal: Normal range of motion. She exhibits no edema or tenderness.  Lymphadenopathy:    She has no cervical adenopathy.  Neurological: She is alert and oriented to person, place, and time. Coordination normal.  Skin: Skin is warm and dry. No rash noted. She is not diaphoretic.  Psychiatric: She has a normal mood and affect. Her behavior is normal.  Vitals reviewed.   Results for orders placed or performed during the hospital encounter of 11/07/14  CBC with Differential/Platelet  Result Value Ref Range   WBC 6.8 4.0 - 10.5 K/uL   RBC 4.07 3.87 - 5.11 MIL/uL   Hemoglobin 13.0 12.0 - 15.0 g/dL   HCT 39.6 36.0 - 46.0 %   MCV 97.3 78.0 - 100.0 fL   MCH 31.9 26.0 - 34.0 pg   MCHC 32.8 30.0 - 36.0 g/dL   RDW 13.7 11.5 - 15.5 %   Platelets 207 150 - 400 K/uL   Neutrophils Relative % 49 %   Neutro Abs 3.3 1.7 - 7.7 K/uL   Lymphocytes Relative 39 %   Lymphs Abs 2.7 0.7 - 4.0 K/uL   Monocytes Relative 11 %   Monocytes Absolute 0.7 0.1 - 1.0 K/uL   Eosinophils Relative 1 %   Eosinophils Absolute 0.1 0.0 - 0.7 K/uL   Basophils Relative 0 %   Basophils Absolute 0.0 0.0 - 0.1 K/uL  Comprehensive metabolic panel  Result Value Ref Range   Sodium 140 135 - 145 mmol/L   Potassium 3.6 3.5 - 5.1 mmol/L   Chloride 106 101 - 111 mmol/L   CO2 25 22 - 32 mmol/L   Glucose, Bld 129 (H) 65 - 99 mg/dL   BUN 12 6 - 20  mg/dL   Creatinine, Ser 0.85 0.44 - 1.00 mg/dL   Calcium 8.8 (L) 8.9 - 10.3 mg/dL   Total Protein 6.8 6.5 - 8.1 g/dL   Albumin 3.8 3.5 - 5.0 g/dL   AST 22 15 - 41 U/L   ALT 9 (L) 14 - 54 U/L   Alkaline Phosphatase 77 38 - 126 U/L   Total Bilirubin 0.5 0.3 - 1.2 mg/dL   GFR calc non Af Amer >60 >60 mL/min   GFR calc Af Amer >  60 >60 mL/min   Anion gap 9 5 - 15  Troponin I  Result Value Ref Range   Troponin I <0.03 <0.031 ng/mL  D-dimer, quantitative (not at [facility])  Result Value Ref Range   D-Dimer, Quant 0.53 (H) 0.00 - 0.48 ug/mL-FEU      Assessment & Plan:   Problem List Items Addressed This Visit      Respiratory   COPD exacerbation ([place]) - Primary   Relevant Medications   azithromycin (ZITHROMAX) 250 MG tablet   predniSONE (DELTASONE) 20 MG tablet   cefTRIAXone (ROCEPHIN) injection 1 g (Start on 01/25/2015  2:45 PM)     Other   Tobacco use disorder    We'll use patches and gums to try and quit. Has quit on her own before.       Other Visit Diagnoses    Screening for colon cancer        Relevant Orders    Ambulatory referral to Gastroenterology    Screening for cervical cancer        Relevant Orders    Ambulatory referral to Gynecology        Follow up plan: Return in about 4 weeks (around 02/22/2015), or if symptoms worsen or fail to improve, for COPD follow-up.  Counseling provided for all of the vaccine components Orders Placed This Encounter  Procedures  . Ambulatory referral to Gynecology  . Ambulatory referral to Gastroenterology    Caryl Pina, MD [facility] 01/25/2015, 2:32 PM

## 2015-01-25 NOTE — Assessment & Plan Note (Signed)
We'll use patches and gums to try and quit. Has quit on her own before.

## 2015-01-25 NOTE — Addendum Note (Signed)
Addended by: Michaela Corner on: 01/25/2015 02:53 PM   Modules accepted: Orders

## 2015-01-27 NOTE — Telephone Encounter (Addendum)
Documents on Dr [employer] desk for review, signature.  01/27/15 papers completed, sent to MR to be processed, scanned.

## 2015-01-31 ENCOUNTER — Telehealth: Payer: Self-pay | Admitting: *Deleted

## 2015-01-31 NOTE — Telephone Encounter (Signed)
Form,Wieland Copper Fmla  received,completed by Dr Leta Baptist and Leilani Able faxed  01/27/15.

## 2015-02-16 ENCOUNTER — Ambulatory Visit (INDEPENDENT_AMBULATORY_CARE_PROVIDER_SITE_OTHER): Payer: BLUE CROSS/BLUE SHIELD | Admitting: Women's Health

## 2015-02-16 ENCOUNTER — Encounter: Payer: Self-pay | Admitting: Women's Health

## 2015-02-16 VITALS — BP 138/80 | HR 60 | Ht 65.5 in | Wt 212.0 lb

## 2015-02-16 DIAGNOSIS — L292 Pruritus vulvae: Secondary | ICD-10-CM

## 2015-02-16 DIAGNOSIS — Z01419 Encounter for gynecological examination (general) (routine) without abnormal findings: Secondary | ICD-10-CM

## 2015-02-16 DIAGNOSIS — L821 Other seborrheic keratosis: Secondary | ICD-10-CM

## 2015-02-16 DIAGNOSIS — F17200 Nicotine dependence, unspecified, uncomplicated: Secondary | ICD-10-CM | POA: Insufficient documentation

## 2015-02-16 LAB — POCT WET PREP (WET MOUNT): CLUE CELLS WET PREP WHIFF POC: NEGATIVE

## 2015-02-16 NOTE — Progress Notes (Signed)
Patient ID: Courtney Mcconnell, female   DOB: 07/20/1955, 60 y.o.   MRN: [id number] Subjective:   Courtney Mcconnell is a 60 y.o. G96P2 Caucasian female here for a routine well-woman exam.  No LMP recorded. Patient has had a hysterectomy.   Reports hysterectomy was in late 1980s by [facility]- unsure if ovaries were spared, had what sounds like cervical dysplasia w/ option of 'freezing it off' vs hysterectomy and she chose hysterectomy- states she was told it was not cancer. Has had 1 vaginal pap smear since which was normal, was told she didn't need anymore. She is well past her 75yr follow-up if it was CIN 2 or 3. Is scheduled for mammogram 1/31 at bus that comes to her pcp. Has never had TCS.  Current complaints: vulvar itching for about 1 week, was recently on antibiotic for 'congestion'- hasn't tried anything yet as it just started, but monistat usually works well for her PCP: [place]       Does not desire labs, done by PCP  Social History: Sexual: heterosexual, not sexually active, husband died 14yrs ago Marital Status: widowed Smokes 1/2ppd down from 1.5ppd, trying to quit completely  The following portions of the patient's history were reviewed and updated as appropriate: allergies, current medications, past family history, past medical history, past social history, past surgical history and problem list.  Past Medical History Past Medical History  Diagnosis Date  . Hypertension   . Asthma   . Pneumonia   . GERD (gastroesophageal reflux disease)   . Headache(784.0)   . Bursitis of left hip     right  . Seizure ([place])     most recent 12/12/14    Past Surgical History Past Surgical History  Procedure Laterality Date  . Abdominal hysterectomy    . Cholecystectomy    . Appendectomy    . Cataract extraction Bilateral   . Pars plana vitrectomy Right 08/27/2013    Procedure: PARS PLANA VITRECTOMY WITH 25 GAUGE;  Surgeon: Hayden Pedro, MD;  Location: [facility];  Service:  Ophthalmology;  Laterality: Right;  . Membrane peel Right 08/27/2013    Procedure: MEMBRANE PEEL;  Surgeon: Hayden Pedro, MD;  Location: [facility];  Service: Ophthalmology;  Laterality: Right;  . Laser photo ablation Right 08/27/2013    Procedure: LASER PHOTO ABLATION;  Surgeon: Hayden Pedro, MD;  Location: [facility];  Service: Ophthalmology;  Laterality: Right;  Headscope laser AND Endolaser    Gynecologic History G2P2  No LMP recorded. Patient has had a hysterectomy. Contraception: status post hysterectomy Last Pap: >66yrs ago. Results were: normal Last mammogram: 2005. Results were: normal Last TCS: never  Obstetric History OB History  Gravida Para Term Preterm AB SAB TAB Ectopic Multiple Living  2 2 2       3     # Outcome Date GA Lbr Len/2nd Weight Sex Delivery Anes PTL Lv  2 Term           1 Term               Current Medications Current Outpatient Prescriptions on File Prior to Visit  Medication Sig Dispense Refill  . albuterol (PROVENTIL HFA;VENTOLIN HFA) 108 (90 BASE) MCG/ACT inhaler Inhale 2 puffs into the lungs every 6 (six) hours as needed for wheezing or shortness of breath. 1 Inhaler 2  . Ascorbic Acid (VITAMIN C PO) Take 1 tablet by mouth daily.    Marland Kitchen lacosamide (VIMPAT) 200 MG TABS tablet Take 1  tablet (200 mg total) by mouth 2 (two) times daily. 60 tablet 5  . azithromycin (ZITHROMAX) 250 MG tablet Take 2 the first day and then one each day after. (Patient not taking: Reported on 02/16/2015) 6 tablet 0  . cetirizine (ZYRTEC) 10 MG tablet Take 1 tablet (10 mg total) by mouth daily. (Patient not taking: Reported on 02/16/2015) 30 tablet 11  . Chlorphen-Pseudoephed-APAP (SINE-OFF PO) Take 1 tablet by mouth daily as needed (sinus). Reported on 02/16/2015    . fluticasone (FLONASE) 50 MCG/ACT nasal spray Place 2 sprays into both nostrils daily. (Patient not taking: Reported on 02/16/2015) 16 g 6  . Lacosamide 100 MG TABS Take 1 tablet (100 mg total) by mouth daily. (Patient not  taking: Reported on 02/16/2015) 30 tablet 5  . predniSONE (DELTASONE) 20 MG tablet 2 po at same time daily for 5 days (Patient not taking: Reported on 02/16/2015) 10 tablet 0   No current facility-administered medications on file prior to visit.    Review of Systems Patient denies any headaches, blurred vision, shortness of breath, chest pain, abdominal pain, problems with bowel movements, urination, or intercourse.  Objective:  BP 138/80 mmHg  Pulse 60  Ht 5' 5.5" (1.664 m)  Wt 212 lb (96.163 kg)  BMI 34.73 kg/m2 Physical Exam  General:  Well developed, well nourished, no acute distress. She is alert and oriented x3. Skin:  Warm and dry, multiple large seborrheic keratosis (Lt breast, upper abdomen, lower abdomen) Neck:  Midline trachea, no thyromegaly or nodules Cardiovascular: Regular rate and rhythm, no murmur heard Lungs:  Effort normal, all lung fields clear to auscultation bilaterally Breasts:  No dominant palpable mass, retraction, or nipple discharge Abdomen:  Soft, non tender, no hepatosplenomegaly or masses Pelvic:  External genitalia is normal in appearance.  The vagina is normal in appearance, loss of rugae, atrophic, no lesions, small amt creamy white nonodorous d/c. The cervix and uterus are surgically absent, unable to feel any ovaries/enlargement/masses on exam of adnexae- no tenderness.  Extremities:  No swelling or varicosities noted Psych:  She has a normal mood and affect  Results for orders placed or performed in visit on 02/16/15 (from the past 24 hour(s))  POCT Wet Prep [facility] [place])     Status: Normal   Collection Time: 02/16/15  9:24 AM  Result Value Ref Range   Source Wet Prep POC vaginal    WBC, Wet Prep HPF POC none    Bacteria Wet Prep HPF POC None None, Few, Too numerous to count   BACTERIA WET PREP MORPHOLOGY POC     Clue Cells Wet Prep HPF POC None None, Too numerous to count   Clue Cells Wet Prep Whiff POC Negative Whiff    Yeast Wet Prep HPF POC None     KOH Wet Prep POC     Trichomonas Wet Prep HPF POC none     Assessment:   Healthy well-woman exam S/P hysterectomy >48yrs ago for what sounds like cervical dysplasia Smoker Multiple seborrheic keratosis Vulvar itching w/ recent antibiotic use Never had TCS Past due for mammogram  Plan:  No need for pap smears, if hysterectomy was done for CIN 2 or 3 she is well past her 33yr f/u, the one vaginal pap since hysterectomy was normal Has never seen dermatology, would be good idea to go for assessment Advised complete smoking cessation, gave printed info on same Use monistat 7 if needed for vulvar itching, let us know if not improving  Gave numbers  for local GI MDs to call to get screening TCS scheduled Keep appt 1/31 for mammo at PCP/bus F/U 54yr for physical, or sooner if needed  Tawnya Crook CNM, [facility] 02/16/2015 9:23 AM

## 2015-02-16 NOTE — Patient Instructions (Addendum)
Call to get your colonoscopy scheduled [facility] in [facility] [phone]  Dr. Laural Golden [phone]  Keep appointment on 03/15/15 for  Your mammogram  Monistat if needed for itching  Smoking Cessation, Tips for Success If you are ready to quit smoking, congratulations! You have chosen to help yourself be healthier. Cigarettes bring nicotine, tar, carbon monoxide, and other irritants into your body. Your lungs, heart, and blood vessels will be able to work better without these poisons. There are many different ways to quit smoking. Nicotine gum, nicotine patches, a nicotine inhaler, or nicotine nasal spray can help with physical craving. Hypnosis, support groups, and medicines help break the habit of smoking. WHAT THINGS CAN I DO TO MAKE QUITTING EASIER?  Here are some tips to help you quit for good:  Pick a date when you will quit smoking completely. Tell all of your friends and family about your plan to quit on that date.  Do not try to slowly cut down on the number of cigarettes you are smoking. Pick a quit date and quit smoking completely starting on that day.  Throw away all cigarettes.   Clean and remove all ashtrays from your home, work, and car.  On a card, write down your reasons for quitting. Carry the card with you and read it when you get the urge to smoke.  Cleanse your body of nicotine. Drink enough water and fluids to keep your urine clear or pale yellow. Do this after quitting to flush the nicotine from your body.  Learn to predict your moods. Do not let a bad situation be your excuse to have a cigarette. Some situations in your life might tempt you into wanting a cigarette.  Never have "just one" cigarette. It leads to wanting another and another. Remind yourself of your decision to quit.  Change habits associated with smoking. If you smoked while driving or when feeling stressed, try other activities to replace  smoking. Stand up when drinking your coffee. Brush your teeth after eating. Sit in a different chair when you read the paper. Avoid alcohol while trying to quit, and try to drink fewer caffeinated beverages. Alcohol and caffeine may urge you to smoke.  Avoid foods and drinks that can trigger a desire to smoke, such as sugary or spicy foods and alcohol.  Ask people who smoke not to smoke around you.  Have something planned to do right after eating or having a cup of coffee. For example, plan to take a walk or exercise.  Try a relaxation exercise to calm you down and decrease your stress. Remember, you may be tense and nervous for the first 2 weeks after you quit, but this will pass.  Find new activities to keep your hands busy. Play with a pen, coin, or rubber band. Doodle or draw things on paper.  Brush your teeth right after eating. This will help cut down on the craving for the taste of tobacco after meals. You can also try mouthwash.   Use oral substitutes in place of cigarettes. Try using lemon drops, carrots, cinnamon sticks, or chewing gum. Keep them handy so they are available when you have the urge to smoke.  When you have the urge to smoke, try deep breathing.  Designate your home as a nonsmoking area.  If you are a heavy smoker, ask your health care provider about a prescription for nicotine chewing gum. It can ease your withdrawal from nicotine.  Reward yourself. Set aside the cigarette  money you save and buy yourself something nice.  Look for support from others. Join a support group or smoking cessation program. Ask someone at home or at work to help you with your plan to quit smoking.  Always ask yourself, "Do I need this cigarette or is this just a reflex?" Tell yourself, "Today, I choose not to smoke," or "I do not want to smoke." You are reminding yourself of your decision to quit.  Do not replace cigarette smoking with electronic cigarettes (commonly called  e-cigarettes). The safety of e-cigarettes is unknown, and some may contain harmful chemicals.  If you relapse, do not give up! Plan ahead and think about what you will do the next time you get the urge to smoke. HOW WILL I FEEL WHEN I QUIT SMOKING? You may have symptoms of withdrawal because your body is used to nicotine (the addictive substance in cigarettes). You may crave cigarettes, be irritable, feel very hungry, cough often, get headaches, or have difficulty concentrating. The withdrawal symptoms are only temporary. They are strongest when you first quit but will go away within 10-14 days. When withdrawal symptoms occur, stay in control. Think about your reasons for quitting. Remind yourself that these are signs that your body is healing and getting used to being without cigarettes. Remember that withdrawal symptoms are easier to treat than the major diseases that smoking can cause.  Even after the withdrawal is over, expect periodic urges to smoke. However, these cravings are generally short lived and will go away whether you smoke or not. Do not smoke! WHAT RESOURCES ARE AVAILABLE TO HELP ME QUIT SMOKING? Your health care provider can direct you to community resources or hospitals for support, which may include:  Group support.  Education.  Hypnosis.  Therapy.   This information is not intended to replace advice given to you by your health care provider. Make sure you discuss any questions you have with your health care provider.   Document Released: 10/28/2003 Document Revised: 02/19/2014 Document Reviewed: 07/17/2012 Elsevier Interactive Patient Education 2016 [employer].  Steps to Quit Smoking  Smoking tobacco can be harmful to your health and can affect almost every organ in your body. Smoking puts you, and those around you, at risk for developing many serious chronic diseases. Quitting smoking is difficult, but it is one of the best things that you can do for your health. It is  never too late to quit. WHAT ARE THE BENEFITS OF QUITTING SMOKING? When you quit smoking, you lower your risk of developing serious diseases and conditions, such as:  Lung cancer or lung disease, such as COPD.  Heart disease.  Stroke.  Heart attack.  Infertility.  Osteoporosis and bone fractures. Additionally, symptoms such as coughing, wheezing, and shortness of breath may get better when you quit. You may also find that you get sick less often because your body is stronger at fighting off colds and infections. If you are pregnant, quitting smoking can help to reduce your chances of having a baby of low birth weight. HOW DO I GET READY TO QUIT? When you decide to quit smoking, create a plan to make sure that you are successful. Before you quit:  Pick a date to quit. Set a date within the next two weeks to give you time to prepare.  Write down the reasons why you are quitting. Keep this list in places where you will see it often, such as on your bathroom mirror or in your car or wallet.  Identify the people, places, things, and activities that make you want to smoke (triggers) and avoid them. Make sure to take these actions:  Throw away all cigarettes at home, at work, and in your car.  Throw away smoking accessories, such as Scientist, research (medical).  Clean your car and make sure to empty the ashtray.  Clean your home, including curtains and carpets.  Tell your family, friends, and coworkers that you are quitting. Support from your loved ones can make quitting easier.  Talk with your health care provider about your options for quitting smoking.  Find out what treatment options are covered by your health insurance. WHAT STRATEGIES CAN I USE TO QUIT SMOKING?  Talk with your healthcare provider about different strategies to quit smoking. Some strategies include:  Quitting smoking altogether instead of gradually lessening how much you smoke over a period of time. Research shows  that quitting "cold [place]" is more successful than gradually quitting.  Attending in-person counseling to help you build problem-solving skills. You are more likely to have success in quitting if you attend several counseling sessions. Even short sessions of 10 minutes can be effective.  Finding resources and support systems that can help you to quit smoking and remain smoke-free after you quit. These resources are most helpful when you use them often. They can include:  Online chats with a Social worker.  Telephone quitlines.  Printed Furniture conservator/restorer.  Support groups or group counseling.  Text messaging programs.  Mobile phone applications.  Taking medicines to help you quit smoking. (If you are pregnant or breastfeeding, talk with your health care provider first.) Some medicines contain nicotine and some do not. Both types of medicines help with cravings, but the medicines that include nicotine help to relieve withdrawal symptoms. Your health care provider may recommend:  Nicotine patches, gum, or lozenges.  Nicotine inhalers or sprays.  Non-nicotine medicine that is taken by mouth. Talk with your health care provider about combining strategies, such as taking medicines while you are also receiving in-person counseling. Using these two strategies together makes you more likely to succeed in quitting than if you used either strategy on its own. If you are pregnant or breastfeeding, talk with your health care provider about finding counseling or other support strategies to quit smoking. Do not take medicine to help you quit smoking unless told to do so by your health care provider. WHAT THINGS CAN I DO TO MAKE IT EASIER TO QUIT? Quitting smoking might feel overwhelming at first, but there is a lot that you can do to make it easier. Take these important actions:  Reach out to your family and friends and ask that they support and encourage you during this time. Call telephone quitlines,  reach out to support groups, or work with a counselor for support.  Ask people who smoke to avoid smoking around you.  Avoid places that trigger you to smoke, such as bars, parties, or smoke-break areas at work.  Spend time around people who do not smoke.  Lessen stress in your life, because stress can be a smoking trigger for some people. To lessen stress, try:  Exercising regularly.  Deep-breathing exercises.  Yoga.  Meditating.  Performing a body scan. This involves closing your eyes, scanning your body from head to toe, and noticing which parts of your body are particularly tense. Purposefully relax the muscles in those areas.  Download or purchase mobile phone or tablet apps (applications) that can help you stick to your quit plan  by providing reminders, tips, and encouragement. There are many free apps, such as QuitGuide from the [employer] Office manager for Disease Control and Prevention). You can find other support for quitting smoking (smoking cessation) through smokefree.gov and other websites. HOW WILL I FEEL WHEN I QUIT SMOKING? Within the first 24 hours of quitting smoking, you may start to feel some withdrawal symptoms. These symptoms are usually most noticeable 2-3 days after quitting, but they usually do not last beyond 2-3 weeks. Changes or symptoms that you might experience include:  Mood swings.  Restlessness, anxiety, or irritation.  Difficulty concentrating.  Dizziness.  Strong cravings for sugary foods in addition to nicotine.  Mild weight gain.  Constipation.  Nausea.  Coughing or a sore throat.  Changes in how your medicines work in your body.  A depressed mood.  Difficulty sleeping (insomnia). After the first 2-3 weeks of quitting, you may start to notice more positive results, such as:  Improved sense of smell and taste.  Decreased coughing and sore throat.  Slower heart rate.  Lower blood pressure.  Clearer skin.  The ability to breathe more  easily.  Fewer sick days. Quitting smoking is very challenging for most people. Do not get discouraged if you are not successful the first time. Some people need to make many attempts to quit before they achieve long-term success. Do your best to stick to your quit plan, and talk with your health care provider if you have any questions or concerns.   This information is not intended to replace advice given to you by your health care provider. Make sure you discuss any questions you have with your health care provider.   Document Released: 01/23/2001 Document Revised: 06/15/2014 Document Reviewed: 06/15/2014 Elsevier Interactive Patient Education [employer].

## 2015-02-22 ENCOUNTER — Ambulatory Visit: Payer: 59 | Admitting: Family Medicine

## 2015-03-02 ENCOUNTER — Ambulatory Visit (INDEPENDENT_AMBULATORY_CARE_PROVIDER_SITE_OTHER): Payer: BLUE CROSS/BLUE SHIELD | Admitting: Family Medicine

## 2015-03-02 ENCOUNTER — Encounter: Payer: Self-pay | Admitting: Family Medicine

## 2015-03-02 VITALS — BP 118/71 | HR 65 | Temp 98.7°F | Ht 65.5 in | Wt 212.6 lb

## 2015-03-02 DIAGNOSIS — J439 Emphysema, unspecified: Secondary | ICD-10-CM | POA: Diagnosis not present

## 2015-03-02 MED ORDER — TIOTROPIUM BROMIDE MONOHYDRATE 18 MCG IN CAPS: 18.0000 ug | ORAL_CAPSULE | Freq: Every day | RESPIRATORY_TRACT | Status: DC

## 2015-03-02 NOTE — Progress Notes (Signed)
BP 118/71 mmHg  Pulse 65  Temp(Src) 98.7 F (37.1 C) (Oral)  Ht 5' 5.5" (1.664 m)  Wt 212 lb 9.6 oz (96.435 kg)  BMI 34.83 kg/m2   Subjective:    Patient ID: Courtney Mcconnell, female    DOB: 06/23/1955, 60 y.o.   MRN: [id number]  HPI: Courtney Mcconnell is a 60 y.o. female presenting on 03/02/2015 for COPD   HPI Recheck on COPD Patient presents today for 4 week recheck on her COPD. She says that she is still having intermittent wheezing every 2-3 days and coughing spells about every 2-3 days.She says that she has nighttime symptoms 1-2 times per month. Since her illness last month she still feels like she has wheezing intermittently and uses her inhaler at least a couple times a week. She denies any fevers or chills. She denies any nasal congestion or sinus pressure. She does admit that she did stop smoking 1 week ago.  Relevant past medical, surgical, family and social history reviewed and updated as indicated. Interim medical history since our last visit reviewed. Allergies and medications reviewed and updated.  Review of Systems  Constitutional: Negative for fever and chills.  HENT: Negative for congestion, ear discharge, ear pain, postnasal drip, rhinorrhea, sinus pressure, sneezing and sore throat.   Eyes: Negative for pain, redness and visual disturbance.  Respiratory: Positive for cough and wheezing. Negative for chest tightness and shortness of breath.   Cardiovascular: Negative for chest pain and leg swelling.  Genitourinary: Negative for dysuria and difficulty urinating.  Musculoskeletal: Negative for back pain and gait problem.  Skin: Negative for rash.  Neurological: Negative for light-headedness and headaches.  Psychiatric/Behavioral: Negative for behavioral problems and agitation.  All other systems reviewed and are negative.   Per HPI unless specifically indicated above     Medication List       This list is accurate as of: 03/02/15  8:42 AM.  Always use your most  recent med list.               ADVIL ALLERGY SINUS PO  Take by mouth.     albuterol 108 (90 Base) MCG/ACT inhaler  Commonly known as:  PROVENTIL HFA;VENTOLIN HFA  Inhale 2 puffs into the lungs every 6 (six) hours as needed for wheezing or shortness of breath.     cetirizine 10 MG tablet  Commonly known as:  ZYRTEC  Take 1 tablet (10 mg total) by mouth daily.     fluticasone 50 MCG/ACT nasal spray  Commonly known as:  FLONASE  Place 2 sprays into both nostrils daily.     lacosamide 200 MG Tabs tablet  Commonly known as:  VIMPAT  Take 1 tablet (200 mg total) by mouth 2 (two) times daily.     Lacosamide 100 MG Tabs  Take 1 tablet (100 mg total) by mouth daily.     tiotropium 18 MCG inhalation capsule  Commonly known as:  SPIRIVA HANDIHALER  Place 1 capsule (18 mcg total) into inhaler and inhale daily.     VITAMIN C PO  Take 1 tablet by mouth daily.           Objective:    BP 118/71 mmHg  Pulse 65  Temp(Src) 98.7 F (37.1 C) (Oral)  Ht 5' 5.5" (1.664 m)  Wt 212 lb 9.6 oz (96.435 kg)  BMI 34.83 kg/m2  Wt Readings from Last 3 Encounters:  03/02/15 212 lb 9.6 oz (96.435 kg)  02/16/15 212 lb (96.163 kg)  01/25/15 207 lb (93.895 kg)    Physical Exam  Constitutional: She is oriented to person, place, and time. She appears well-developed and well-nourished. No distress.  HENT:  Right Ear: Tympanic membrane, external ear and ear canal normal.  Left Ear: Tympanic membrane, external ear and ear canal normal.  Nose: No mucosal edema or rhinorrhea. No epistaxis. Right sinus exhibits no maxillary sinus tenderness and no frontal sinus tenderness. Left sinus exhibits no maxillary sinus tenderness and no frontal sinus tenderness.  Mouth/Throat: Uvula is midline and mucous membranes are normal. No oropharyngeal exudate, posterior oropharyngeal edema, posterior oropharyngeal erythema or tonsillar abscesses.  Eyes: Conjunctivae and EOM are normal.  Cardiovascular: Normal rate,  regular rhythm, normal heart sounds and intact distal pulses.   No murmur heard. Pulmonary/Chest: Effort normal and breath sounds normal. No respiratory distress. She has no decreased breath sounds. She has no wheezes. She has no rhonchi. She has no rales.  Musculoskeletal: Normal range of motion. She exhibits no edema or tenderness.  Neurological: She is alert and oriented to person, place, and time. Coordination normal.  Skin: Skin is warm and dry. No rash noted. She is not diaphoretic.  Psychiatric: She has a normal mood and affect. Her behavior is normal.  Vitals reviewed.       Assessment & Plan:   Problem List Items Addressed This Visit    None    Visit Diagnoses    Pulmonary emphysema, unspecified emphysema type ([place])    -  Primary    We will add Spiriva. Patient did well on her spirometry.    Relevant Medications    Chlorpheniramine-PSE-Ibuprofen (ADVIL ALLERGY SINUS PO)    tiotropium (SPIRIVA HANDIHALER) 18 MCG inhalation capsule    Other Relevant Orders    PR BREATHING CAPACITY TEST        Follow up plan: Return in about 3 months (around 05/31/2015), or if symptoms worsen or fail to improve, for Follow-up COPD.  Counseling provided for all of the vaccine components Orders Placed This Encounter  Procedures  . PR BREATHING CAPACITY TEST    Caryl Pina, MD [facility] 03/02/2015, 8:42 AM

## 2015-03-31 ENCOUNTER — Telehealth: Payer: Self-pay | Admitting: Family Medicine

## 2015-04-01 NOTE — Telephone Encounter (Signed)
scheduled

## 2015-04-06 DIAGNOSIS — G4730 Sleep apnea, unspecified: Secondary | ICD-10-CM | POA: Insufficient documentation

## 2015-05-03 ENCOUNTER — Ambulatory Visit (INDEPENDENT_AMBULATORY_CARE_PROVIDER_SITE_OTHER): Payer: BLUE CROSS/BLUE SHIELD | Admitting: Family Medicine

## 2015-05-03 ENCOUNTER — Encounter: Payer: Self-pay | Admitting: Family Medicine

## 2015-05-03 ENCOUNTER — Ambulatory Visit (INDEPENDENT_AMBULATORY_CARE_PROVIDER_SITE_OTHER): Payer: BLUE CROSS/BLUE SHIELD

## 2015-05-03 VITALS — BP 136/83 | HR 66 | Temp 98.0°F | Ht 65.5 in | Wt 223.6 lb

## 2015-05-03 DIAGNOSIS — R091 Pleurisy: Secondary | ICD-10-CM | POA: Diagnosis not present

## 2015-05-03 MED ORDER — HYDROCODONE-HOMATROPINE 5-1.5 MG/5ML PO SYRP: 5.0000 mL | ORAL_SOLUTION | Freq: Three times a day (TID) | ORAL | Status: DC | PRN

## 2015-05-03 NOTE — Progress Notes (Signed)
   Subjective:    Patient ID: Courtney Mcconnell, female    DOB: Oct 31, 1955, 61 y.o.   MRN: [id number]  HPI 60 year old female with left sided back pain at a rate radiates around to her chest. She has had some shortness of breath and cough. Cough is productive of white sputum. She has not had any fever or chills. She does have a history of COPD. She uses her inhalers regularly. She is concerned about pneumonia because this reminds her of when she had pneumonia some 20-25 years ago  Patient Active Problem List   Diagnosis Date Noted  . Pulmonary emphysema ([place]) 03/02/2015  . [place] itching 02/16/2015  . Smoker 02/16/2015  . Seborrheic keratoses 02/16/2015  . COPD exacerbation ([place]) 01/25/2015  . Seizure disorder ([place]) 01/21/2015  . Seizures ([place]) 04/18/2014  . Acute renal failure ([place]) 04/18/2014  . Dehydration 04/18/2014  . HTN (hypertension) 04/18/2014  . 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 Acute bronchitis 07/19/2012  . Tobacco use disorder 07/19/2012  . Allergic rhinitis 07/19/2012  . Otitis media 07/19/2012   Outpatient Encounter Prescriptions as of 05/03/2015  Medication Sig  . albuterol (PROVENTIL HFA;VENTOLIN HFA) 108 (90 BASE) MCG/ACT inhaler Inhale 2 puffs into the lungs every 6 (six) hours as needed for wheezing or shortness of breath.  . Ascorbic Acid (VITAMIN C PO) Take 1 tablet by mouth daily.  . cetirizine (ZYRTEC) 10 MG tablet Take 1 tablet (10 mg total) by mouth daily.  . Chlorpheniramine-PSE-Ibuprofen (ADVIL ALLERGY SINUS PO) Take by mouth.  . fluticasone (FLONASE) 50 MCG/ACT nasal spray Place 2 sprays into both nostrils daily.  Marland Kitchen lacosamide (VIMPAT) 200 MG TABS tablet Take 1 tablet (200 mg total) by mouth 2 (two) times daily.  . Lacosamide 100 MG TABS Take 1 tablet (100 mg total) by mouth daily.  Marland Kitchen tiotropium (SPIRIVA HANDIHALER) 18 MCG inhalation capsule Place 1 capsule (18 mcg total) into inhaler and inhale daily.  Marland Kitchen HYDROcodone-homatropine (HYCODAN) 5-1.5  MG/5ML syrup Take 5 mLs by mouth every 8 (eight) hours as needed for cough.   No facility-administered encounter medications on file as of 05/03/2015.      Review of Systems  Constitutional: Negative.   HENT: Negative.   Respiratory: Positive for cough and shortness of breath.   Cardiovascular: Positive for chest pain.  Genitourinary: Negative.   Neurological: Negative.   Psychiatric/Behavioral: Negative.        Objective:   Physical Exam  Constitutional: She is oriented to person, place, and time. She appears well-developed and well-nourished.  Cardiovascular: Normal rate, regular rhythm and normal heart sounds.   Pulmonary/Chest: Effort normal and breath sounds normal. She exhibits tenderness.  Neurological: She is alert and oriented to person, place, and time.  Psychiatric: She has a normal mood and affect. Her behavior is normal.          Assessment & Plan:  1. Pleurisy X A she will watch for development of rash. In meantime. Continue treating with Aleve. I did give her a prescription for Hycodan for cough to take as needed  Wardell Honour MD-ray is negative for pneumonia. Skin is clear as the pattern of pain appears to be dermatomal in suggestive of zoster. - DG Chest 2 View; Future

## 2015-06-01 ENCOUNTER — Encounter: Payer: Self-pay | Admitting: Family Medicine

## 2015-06-01 ENCOUNTER — Ambulatory Visit (INDEPENDENT_AMBULATORY_CARE_PROVIDER_SITE_OTHER): Payer: BLUE CROSS/BLUE SHIELD | Admitting: Family Medicine

## 2015-06-01 VITALS — BP 134/83 | HR 80 | Temp 98.1°F | Ht 65.5 in | Wt 230.2 lb

## 2015-06-01 DIAGNOSIS — R911 Solitary pulmonary nodule: Secondary | ICD-10-CM

## 2015-06-01 DIAGNOSIS — Z129 Encounter for screening for malignant neoplasm, site unspecified: Secondary | ICD-10-CM | POA: Diagnosis not present

## 2015-06-01 DIAGNOSIS — J439 Emphysema, unspecified: Secondary | ICD-10-CM

## 2015-06-01 MED ORDER — FLUTICASONE PROPIONATE 50 MCG/ACT NA SUSP: 2.0000 | Freq: Every day | NASAL | Status: DC

## 2015-06-01 MED ORDER — CETIRIZINE HCL 10 MG PO TABS: 10.0000 mg | ORAL_TABLET | Freq: Every day | ORAL | Status: DC

## 2015-06-01 NOTE — Progress Notes (Signed)
BP 134/83 mmHg  Pulse 80  Temp(Src) 98.1 F (36.7 C) (Oral)  Ht 5' 5.5" (1.664 m)  Wt 230 lb 3.2 oz (104.418 kg)  BMI 37.71 kg/m2   Subjective:    Patient ID: Courtney Mcconnell, female    DOB: Jul 05, 1955, 60 y.o.   MRN: [id number]  HPI: Courtney Mcconnell is a 60 y.o. female presenting on 06/01/2015 for COPD   HPI COPD recheck Patient is coming in today for a COPD recheck. She feels like she is doing very well on her Spiriva and is rarely having to use her Proventil. She also quit smoking in January and that has been helping her greatly. She is also coming in for follow-up on a solitary pulmonary nodule was found 6 months ago and she needs a repeat CT scan.  Relevant past medical, surgical, family and social history reviewed and updated as indicated. Interim medical history since our last visit reviewed. Allergies and medications reviewed and updated.  Review of Systems  Constitutional: Negative for fever and chills.  HENT: Negative for congestion, ear discharge, ear pain, postnasal drip, rhinorrhea, sinus pressure and sneezing.   Eyes: Negative for redness and visual disturbance.  Respiratory: Negative for cough, chest tightness and shortness of breath.   Cardiovascular: Negative for chest pain and leg swelling.  Genitourinary: Negative for dysuria and difficulty urinating.  Musculoskeletal: Negative for back pain and gait problem.  Skin: Negative for rash.  Neurological: Negative for dizziness, light-headedness and headaches.  Psychiatric/Behavioral: Negative for behavioral problems and agitation.  All other systems reviewed and are negative.  Per HPI unless specifically indicated above    Medication List       This list is accurate as of: 06/01/15  4:07 PM.  Always use your most recent med list.               ADVIL ALLERGY SINUS PO  Take by mouth.     albuterol 108 (90 Base) MCG/ACT inhaler  Commonly known as:  PROVENTIL HFA;VENTOLIN HFA  Inhale 2 puffs into the lungs  every 6 (six) hours as needed for wheezing or shortness of breath.     cetirizine 10 MG tablet  Commonly known as:  ZYRTEC  Take 1 tablet (10 mg total) by mouth daily.     fluticasone 50 MCG/ACT nasal spray  Commonly known as:  FLONASE  Place 2 sprays into both nostrils daily.     lacosamide 200 MG Tabs tablet  Commonly known as:  VIMPAT  Take 1 tablet (200 mg total) by mouth 2 (two) times daily.     Lacosamide 100 MG Tabs  Take 1 tablet (100 mg total) by mouth daily.     tiotropium 18 MCG inhalation capsule  Commonly known as:  SPIRIVA HANDIHALER  Place 1 capsule (18 mcg total) into inhaler and inhale daily.     VITAMIN C PO  Take 1 tablet by mouth daily.          Objective:    BP 134/83 mmHg  Pulse 80  Temp(Src) 98.1 F (36.7 C) (Oral)  Ht 5' 5.5" (1.664 m)  Wt 230 lb 3.2 oz (104.418 kg)  BMI 37.71 kg/m2  Wt Readings from Last 3 Encounters:  06/01/15 230 lb 3.2 oz (104.418 kg)  05/03/15 223 lb 9.6 oz (101.424 kg)  03/02/15 212 lb 9.6 oz (96.435 kg)    Physical Exam  Constitutional: She is oriented to person, place, and time. She appears well-developed and well-nourished. No distress.  HENT:  Right Ear: External ear normal.  Left Ear: External ear normal.  Nose: Nose normal.  Mouth/Throat: Oropharynx is clear and moist.  Eyes: Conjunctivae and EOM are normal. Pupils are equal, round, and reactive to light.  Neck: Neck supple. No thyromegaly present.  Cardiovascular: Normal rate, regular rhythm, normal heart sounds and intact distal pulses.   No murmur heard. Pulmonary/Chest: Effort normal and breath sounds normal. No respiratory distress. She has no wheezes. She has no rales.  Musculoskeletal: Normal range of motion. She exhibits no edema or tenderness.  Lymphadenopathy:    She has no cervical adenopathy.  Neurological: She is alert and oriented to person, place, and time. Coordination normal.  Skin: Skin is warm and dry. No rash noted. She is not  diaphoretic.  Psychiatric: She has a normal mood and affect. Her behavior is normal.  Nursing note and vitals reviewed.   Results for orders placed or performed in visit on 02/16/15  POCT Wet Prep [facility])  Result Value Ref Range   Source Wet Prep POC vaginal    WBC, Wet Prep HPF POC none    Bacteria Wet Prep HPF POC None None, Few, Too numerous to count   BACTERIA WET PREP MORPHOLOGY POC     Clue Cells Wet Prep HPF POC None None, Too numerous to count   Clue Cells Wet Prep Whiff POC Negative Whiff    Yeast Wet Prep HPF POC None    KOH Wet Prep POC     Trichomonas Wet Prep HPF POC none       Assessment & Plan:       Problem List Items Addressed This Visit      Respiratory   Pulmonary emphysema (HCC)   Relevant Medications   fluticasone (FLONASE) 50 MCG/ACT nasal spray   cetirizine (ZYRTEC) 10 MG tablet    Other Visit Diagnoses    Screening for malignant neoplasm    -  Primary    Relevant Orders    Ambulatory referral to Gastroenterology    Solitary pulmonary nodule        Relevant Orders    CT Chest W Contrast        Follow up plan: Return in about 6 months (around 12/01/2015), or if symptoms worsen or fail to improve, for Annual check and recheck COPD.  Counseling provided for all of the vaccine components Orders Placed This Encounter  Procedures  . CT Chest W Contrast  . Ambulatory referral to Gastroenterology    Caryl Pina, MD [facility] 06/01/2015, 4:07 PM

## 2015-06-03 ENCOUNTER — Encounter: Payer: Self-pay | Admitting: Gastroenterology

## 2015-06-06 ENCOUNTER — Encounter: Payer: BLUE CROSS/BLUE SHIELD | Admitting: *Deleted

## 2015-06-06 LAB — HM MAMMOGRAPHY

## 2015-06-15 ENCOUNTER — Encounter: Payer: Self-pay | Admitting: *Deleted

## 2015-06-22 ENCOUNTER — Telehealth: Payer: Self-pay | Admitting: Family Medicine

## 2015-06-22 NOTE — Telephone Encounter (Signed)
appt given for tomorrow per patients request 

## 2015-06-23 ENCOUNTER — Ambulatory Visit (INDEPENDENT_AMBULATORY_CARE_PROVIDER_SITE_OTHER): Payer: BLUE CROSS/BLUE SHIELD | Admitting: Family Medicine

## 2015-06-23 ENCOUNTER — Encounter: Payer: Self-pay | Admitting: Family Medicine

## 2015-06-23 VITALS — BP 148/90 | HR 60 | Temp 96.9°F | Ht 65.5 in | Wt 231.8 lb

## 2015-06-23 DIAGNOSIS — L0390 Cellulitis, unspecified: Secondary | ICD-10-CM

## 2015-06-23 DIAGNOSIS — L0291 Cutaneous abscess, unspecified: Secondary | ICD-10-CM | POA: Diagnosis not present

## 2015-06-23 MED ORDER — CLINDAMYCIN HCL 300 MG PO CAPS: 300.0000 mg | ORAL_CAPSULE | Freq: Four times a day (QID) | ORAL | Status: DC

## 2015-06-23 NOTE — Patient Instructions (Signed)
Great to meet you!  Take all of the antibiotics  Eat yogurt 1 cup a day while you are on the antibiotics  Try warm compresses and gentle massage, drainage is good in this case  If you are not improving or if you get worse please come back.   If you  develop fever, chills, nausea and vomiting that are persistent please get medical attention right away  Cellulitis Cellulitis is an infection of the skin and the tissue beneath it. The infected area is usually red and tender. Cellulitis occurs most often in the arms and lower legs.  CAUSES  Cellulitis is caused by bacteria that enter the skin through cracks or cuts in the skin. The most common types of bacteria that cause cellulitis are staphylococci and streptococci. SIGNS AND SYMPTOMS   Redness and warmth.  Swelling.  Tenderness or pain.  Fever. DIAGNOSIS  Your health care provider can usually determine what is wrong based on a physical exam. Blood tests may also be done. TREATMENT  Treatment usually involves taking an antibiotic medicine. HOME CARE INSTRUCTIONS   Take your antibiotic medicine as directed by your health care provider. Finish the antibiotic even if you start to feel better.  Keep the infected arm or leg elevated to reduce swelling.  Apply a warm cloth to the affected area up to 4 times per day to relieve pain.  Take medicines only as directed by your health care provider.  Keep all follow-up visits as directed by your health care provider. SEEK MEDICAL CARE IF:   You notice red streaks coming from the infected area.  Your red area gets larger or turns dark in color.  Your bone or joint underneath the infected area becomes painful after the skin has healed.  Your infection returns in the same area or another area.  You notice a swollen bump in the infected area.  You develop new symptoms.  You have a fever. SEEK IMMEDIATE MEDICAL CARE IF:   You feel very sleepy.  You develop vomiting or  diarrhea.  You have a general ill feeling (malaise) with muscle aches and pains.   This information is not intended to replace advice given to you by your health care provider. Make sure you discuss any questions you have with your health care provider.   Document Released: 11/08/2004 Document Revised: 10/20/2014 Document Reviewed: 04/16/2011 Elsevier Interactive Patient Education [employer].

## 2015-06-23 NOTE — Progress Notes (Signed)
   HPI  Patient presents today with concern of leg infection. 1 year ago she had an injury causing a chronic leg wound that she was treated for at the wound care center in [place]. She was released from there with complete healing about 4 months ago.  3 days ago she developed a large tender swelling at the proximal portion of the injury with some yellow clear drainage. She states that the swollen area went down and she felt much better, then it came back up and she's having warmth, tenderness, and spreading erythema to the medial and proximal part of the leg.  She denies any fever, chills, sweats, nausea or vomiting.  2 days ago she did have an episode of nausea and vomiting but feels well currently.  PMH: Smoking status noted ROS: Per HPI  Objective: BP 148/90 mmHg  Pulse 60  Temp(Src) 96.9 F (36.1 C) (Oral)  Ht 5' 5.5" (1.664 m)  Wt 231 lb 12.8 oz (105.144 kg)  BMI 37.97 kg/m2 Gen: NAD, alert, cooperative with exam HEENT: NCAT CV: RRR, good S1/S2, no murmur Resp: CTABL, no wheezes, non-labored Neuro: Alert and oriented, No gross deficits  Skin R leg with large scaron medial distal lower leg At proximal end there is a approx 2 cm in diameter sub cutaneous swelling that is warm and tender to palp. She has a streak of erythema about 8 cm extending proximally and medially.  Small amount of clear drainage Small area of crusting beneath the swelling at the proximal end of the scar measuring 1.3 cm X 2 cm  Assessment and plan:  # Assessment cellulitis Treated with clindamycin Given that it is draining I believe that she will be able to get by without an incision and drainage. Recommended warm compresses and gentle pressure to help it drain Although I think this is simple abscess and cellulitis at this time if she has persistent episode of very suspicious of osteomyelitis given chronic previous wound I discussed with her several reasons to return for care including failure  to improve or any worsening, if she develops symptoms of nausea, vomiting, or fever have asked her to go the emergency room.  Probiotic or yogurt while on antibiotics  Meds ordered this encounter  Medications  . clindamycin (CLEOCIN) 300 MG capsule    Sig: Take 1 capsule (300 mg total) by mouth 4 (four) times daily.    Dispense:  40 capsule    Refill:  [place], MD [place] 06/23/2015, 10:18 AM

## 2015-07-06 ENCOUNTER — Ambulatory Visit (INDEPENDENT_AMBULATORY_CARE_PROVIDER_SITE_OTHER): Payer: BLUE CROSS/BLUE SHIELD

## 2015-07-06 ENCOUNTER — Ambulatory Visit (INDEPENDENT_AMBULATORY_CARE_PROVIDER_SITE_OTHER): Payer: BLUE CROSS/BLUE SHIELD | Admitting: Family Medicine

## 2015-07-06 ENCOUNTER — Encounter: Payer: Self-pay | Admitting: Family Medicine

## 2015-07-06 VITALS — BP 138/90 | HR 75 | Temp 98.0°F | Ht 65.5 in | Wt 228.2 lb

## 2015-07-06 DIAGNOSIS — L0291 Cutaneous abscess, unspecified: Secondary | ICD-10-CM

## 2015-07-06 DIAGNOSIS — I10 Essential (primary) hypertension: Secondary | ICD-10-CM | POA: Diagnosis not present

## 2015-07-06 DIAGNOSIS — L0390 Cellulitis, unspecified: Secondary | ICD-10-CM

## 2015-07-06 DIAGNOSIS — Z0000 Encounter for general adult medical examination without abnormal findings: Secondary | ICD-10-CM

## 2015-07-06 MED ORDER — SULFAMETHOXAZOLE-TRIMETHOPRIM 800-160 MG PO TABS: 1.0000 | ORAL_TABLET | Freq: Two times a day (BID) | ORAL | Status: DC

## 2015-07-06 NOTE — Patient Instructions (Signed)
Great to see you!  We will start with the X ray and antibiotics  We will consider the next step, either an Ultrasound or an MRI to evaluate for osteomyelitis (bone infection)  Lets see you back next Monday  Take the packing out friday or saturday

## 2015-07-06 NOTE — Progress Notes (Signed)
   HPI  Patient presents today here with abscess of the right lower lobe.  Patient was seen about 2 weeks ago and treated for abscess with cellulitis, she's been using warm compresses and the wound is draining very slightly, improved while she was on clindamycin but then got worse after she stopped. She has some pain of the knee which is just proximal to the wound.  The abscess is at the proximal edge of the old wound that was chronic and had to be treated at wound care [facility] care treatment about 5 months ago and this started about 3 weeks ago.  PMH: Smoking status noted ROS: Per HPI  Objective: BP 138/90 mmHg  Pulse 75  Temp(Src) 98 F (36.7 C) (Oral)  Ht 5' 5.5" (1.664 m)  Wt 228 lb 3.2 oz (103.511 kg)  BMI 37.38 kg/m2 Gen: NAD, alert, cooperative with exam HEENT: NCAT, EOMI, PERRL CV: RRR, good S1/S2, no murmur Resp: CTABL, no wheezes, non-labored Ext: No edema, warm Neuro: Alert and oriented, No gross deficits  Plain film of the right lower leg with no acute findings   I&D Area was anesthetized using 2 mL of 2% Xylocaine with epinephrine. Using 29 swabs the area was cleaned and then wiped clear with alcohol. Using 11 blade a 1.5 cm incision was made at the distal end of the lesion returning only serosanguineous fluid, a few loculations were broken up with no purulent fluid returned.  While the patient was anesthatized the forceps were placed into the lesion that is draining and easily entered about 1 cm.  Assessment and plan:  # Abscess and cellulitis, chronic wound, concern for sinus tract at the site of an old chronic wound After my exam and attempt at I&D as concerned about deeper draining sinus or deeper abscess Begin evaluation with x-ray and labs Consider MRI versus ultrasound to rule out deep abscess, sinus, or osteomyelitis, will wait for labs to try and make final decision Consider surgery referral if treatment is not clear.  # Hypertension Diet  controlled, reasonable today No medications Labs  # Healthcare maintenance Hep C checked   Orders Placed This Encounter  Procedures  . DG Tibia/Fibula Right    Standing Status: Future     Number of Occurrences:      Standing Expiration Date: 09/04/2016    Order Specific Question:  Reason for Exam (SYMPTOM  OR DIAGNOSIS REQUIRED)    Answer:  abscess, r/o Osteo    Order Specific Question:  Is the patient pregnant?    Answer:  No    Order Specific Question:  Preferred imaging location?    Answer:  External  . Sedimentation rate  . CMP14+EGFR  . CBC with Differential  . Hepatitis C antibody  . LDL Cholesterol, Direct    Meds ordered this encounter  Medications  . sulfamethoxazole-trimethoprim (BACTRIM DS) 800-160 MG tablet    Sig: Take 1 tablet by mouth 2 (two) times daily.    Dispense:  14 tablet    Refill:  0    Laroy Apple, MD [facility] 07/06/2015, 4:07 PM

## 2015-07-07 LAB — CBC WITH DIFFERENTIAL/PLATELET
BASOS: 0 %
Basophils Absolute: 0 10*3/uL (ref 0.0–0.2)
EOS (ABSOLUTE): 0.2 10*3/uL (ref 0.0–0.4)
EOS: 2 %
Hematocrit: 42.2 % (ref 34.0–46.6)
Hemoglobin: 14.2 g/dL (ref 11.1–15.9)
IMMATURE GRANS (ABS): 0 10*3/uL (ref 0.0–0.1)
IMMATURE GRANULOCYTES: 0 %
LYMPHS: 40 %
Lymphocytes Absolute: 3.6 10*3/uL — ABNORMAL HIGH (ref 0.7–3.1)
MCH: 31.8 pg (ref 26.6–33.0)
MCHC: 33.6 g/dL (ref 31.5–35.7)
MCV: 95 fL (ref 79–97)
Monocytes Absolute: 0.9 10*3/uL (ref 0.1–0.9)
Monocytes: 10 %
NEUTROS PCT: 48 %
Neutrophils Absolute: 4.2 10*3/uL (ref 1.4–7.0)
PLATELETS: 274 10*3/uL (ref 150–379)
RBC: 4.46 x10E6/uL (ref 3.77–5.28)
RDW: 13.7 % (ref 12.3–15.4)
WBC: 8.9 10*3/uL (ref 3.4–10.8)

## 2015-07-07 LAB — CMP14+EGFR
ALT: 10 IU/L (ref 0–32)
AST: 18 IU/L (ref 0–40)
Albumin/Globulin Ratio: 1.5 (ref 1.2–2.2)
Albumin: 4.3 g/dL (ref 3.5–5.5)
Alkaline Phosphatase: 100 IU/L (ref 39–117)
BUN/Creatinine Ratio: 9 (ref 9–23)
BUN: 7 mg/dL (ref 6–24)
Bilirubin Total: <0.2 mg/dL (ref 0.0–1.2)
CALCIUM: 9.4 mg/dL (ref 8.7–10.2)
CO2: 26 mmol/L (ref 18–29)
CREATININE: 0.81 mg/dL (ref 0.57–1.00)
Chloride: 97 mmol/L (ref 96–106)
GFR calc Af Amer: 92 mL/min/{1.73_m2} (ref >59)
GFR, EST NON AFRICAN AMERICAN: 80 mL/min/{1.73_m2} (ref >59)
GLUCOSE: 91 mg/dL (ref 65–99)
Globulin, Total: 2.8 g/dL (ref 1.5–4.5)
POTASSIUM: 4.2 mmol/L (ref 3.5–5.2)
Sodium: 140 mmol/L (ref 134–144)
Total Protein: 7.1 g/dL (ref 6.0–8.5)

## 2015-07-07 LAB — SEDIMENTATION RATE: Sed Rate: 3 mm/hr (ref 0–40)

## 2015-07-07 LAB — LDL CHOLESTEROL, DIRECT: LDL DIRECT: 151 mg/dL — AB (ref 0–99)

## 2015-07-07 LAB — HEPATITIS C ANTIBODY: Hep C Virus Ab: 0.1 s/co ratio (ref 0.0–0.9)

## 2015-07-08 ENCOUNTER — Other Ambulatory Visit: Payer: Self-pay | Admitting: Family Medicine

## 2015-07-08 DIAGNOSIS — L0291 Cutaneous abscess, unspecified: Secondary | ICD-10-CM

## 2015-07-08 DIAGNOSIS — L0390 Cellulitis, unspecified: Principal | ICD-10-CM

## 2015-07-12 ENCOUNTER — Encounter: Payer: BLUE CROSS/BLUE SHIELD | Admitting: *Deleted

## 2015-07-15 ENCOUNTER — Telehealth: Payer: Self-pay | Admitting: Family Medicine

## 2015-07-15 ENCOUNTER — Ambulatory Visit (HOSPITAL_COMMUNITY)
Admission: RE | Admit: 2015-07-15 | Discharge: 2015-07-15 | Disposition: A | Discharge status: Home / Self Care | Payer: BLUE CROSS/BLUE SHIELD | Source: Ambulatory Visit | Attending: Family Medicine | Admitting: Family Medicine

## 2015-07-15 DIAGNOSIS — L988 Other specified disorders of the skin and subcutaneous tissue: Secondary | ICD-10-CM | POA: Insufficient documentation

## 2015-07-15 DIAGNOSIS — L0291 Cutaneous abscess, unspecified: Secondary | ICD-10-CM | POA: Diagnosis not present

## 2015-07-15 DIAGNOSIS — L0390 Cellulitis, unspecified: Secondary | ICD-10-CM | POA: Insufficient documentation

## 2015-07-15 MED ORDER — SULFAMETHOXAZOLE-TRIMETHOPRIM 800-160 MG PO TABS: 1.0000 | ORAL_TABLET | Freq: Two times a day (BID) | ORAL | Status: DC

## 2015-07-15 NOTE — Telephone Encounter (Signed)
:   Discussed ultrasound findings. She has a small area of fluid with some debris and a sinus tract, this is at the site of an old chronic wound that healed a few months ago. I have attempted I&D one time which was unsuccessful, I believe because the abscess and tract is too deep for me to properly incise the clinic.   She is still having some soreness, she's finished a course of Bactrim I have sent a message to the referral coordinators in my clinic to try and arrange a general surgery appointment early next week.  For the time being I will place her on Bactrim to keep from any more progress.  She has good precautions to seek emergency medical care if necessary this weekend, however I think this can easily be avoided with antibiotics. I believe deep incision and drainage and healing by secondary intention will be definitive treatment.   Laroy Apple, MD [facility] 07/15/2015, 5:24 PM

## 2015-07-19 ENCOUNTER — Other Ambulatory Visit (HOSPITAL_COMMUNITY): Payer: Self-pay | Admitting: General Surgery

## 2015-07-20 ENCOUNTER — Telehealth: Payer: Self-pay

## 2015-07-20 ENCOUNTER — Ambulatory Visit (AMBULATORY_SURGERY_CENTER): Payer: Self-pay

## 2015-07-20 VITALS — Ht 66.0 in | Wt 232.4 lb

## 2015-07-20 DIAGNOSIS — Z1211 Encounter for screening for malignant neoplasm of colon: Secondary | ICD-10-CM

## 2015-07-20 NOTE — Telephone Encounter (Signed)
Last seizure Jan or Feb this year, 2017.  Had diagnostic testing which involved inducing a seizure on 05-16-15.  Had PV today, 07/20/15.  OK to proceed as planned with colonoscopy on 08/03/15?    Thank you, Kanya Potteiger/PV

## 2015-07-20 NOTE — Progress Notes (Signed)
No allergies to eggs or soy No past problems with anesthesia No home oxygen No diet meds  No internet 

## 2015-07-21 ENCOUNTER — Encounter: Payer: Self-pay | Admitting: Gastroenterology

## 2015-07-21 ENCOUNTER — Other Ambulatory Visit: Payer: Self-pay | Admitting: General Surgery

## 2015-07-21 DIAGNOSIS — M86661 Other chronic osteomyelitis, right tibia and fibula: Secondary | ICD-10-CM

## 2015-07-21 NOTE — Telephone Encounter (Signed)
Yes, OK to proceed as scheduled . Thank you for checking

## 2015-07-25 ENCOUNTER — Other Ambulatory Visit (HOSPITAL_COMMUNITY): Payer: Self-pay | Admitting: General Surgery

## 2015-07-25 DIAGNOSIS — M86162 Other acute osteomyelitis, left tibia and fibula: Secondary | ICD-10-CM

## 2015-07-26 ENCOUNTER — Other Ambulatory Visit (HOSPITAL_COMMUNITY): Payer: Self-pay | Admitting: Orthopedic Surgery

## 2015-07-27 ENCOUNTER — Encounter (HOSPITAL_COMMUNITY): Payer: Self-pay

## 2015-07-27 ENCOUNTER — Encounter (HOSPITAL_COMMUNITY)
Admission: RE | Admit: 2015-07-27 | Discharge: 2015-07-27 | Disposition: A | Discharge status: Home / Self Care | Payer: BLUE CROSS/BLUE SHIELD | Source: Ambulatory Visit | Attending: General Surgery | Admitting: General Surgery

## 2015-07-27 DIAGNOSIS — M86162 Other acute osteomyelitis, left tibia and fibula: Secondary | ICD-10-CM

## 2015-07-27 MED ORDER — TECHNETIUM TC 99M MEDRONATE IV KIT
25.0000 | PACK | Freq: Once | INTRAVENOUS | Status: AC | PRN
  Administered 2015-07-27: 26 via INTRAVENOUS

## 2015-08-01 ENCOUNTER — Other Ambulatory Visit (HOSPITAL_COMMUNITY): Payer: Self-pay | Admitting: Orthopedic Surgery

## 2015-08-01 DIAGNOSIS — M25571 Pain in right ankle and joints of right foot: Secondary | ICD-10-CM

## 2015-08-03 ENCOUNTER — Encounter: Payer: Self-pay | Admitting: Gastroenterology

## 2015-08-05 ENCOUNTER — Ambulatory Visit (HOSPITAL_COMMUNITY)
Admission: RE | Admit: 2015-08-05 | Discharge: 2015-08-05 | Disposition: A | Discharge status: Home / Self Care | Payer: BLUE CROSS/BLUE SHIELD | Source: Ambulatory Visit | Attending: Orthopedic Surgery | Admitting: Orthopedic Surgery

## 2015-08-05 DIAGNOSIS — M25571 Pain in right ankle and joints of right foot: Secondary | ICD-10-CM | POA: Insufficient documentation

## 2015-08-05 MED ORDER — GADOBENATE DIMEGLUMINE 529 MG/ML IV SOLN
20.0000 mL | Freq: Once | INTRAVENOUS | Status: AC | PRN
  Administered 2015-08-05: 20 mL via INTRAVENOUS

## 2015-09-01 ENCOUNTER — Emergency Department (HOSPITAL_COMMUNITY)
Admission: EM | Admit: 2015-09-01 | Discharge: 2015-09-01 | Disposition: A | Discharge status: Home / Self Care | Payer: BLUE CROSS/BLUE SHIELD | Attending: Emergency Medicine | Admitting: Emergency Medicine

## 2015-09-01 ENCOUNTER — Encounter (HOSPITAL_COMMUNITY): Payer: Self-pay | Admitting: Emergency Medicine

## 2015-09-01 ENCOUNTER — Emergency Department (HOSPITAL_COMMUNITY): Payer: BLUE CROSS/BLUE SHIELD

## 2015-09-01 DIAGNOSIS — R079 Chest pain, unspecified: Secondary | ICD-10-CM | POA: Diagnosis present

## 2015-09-01 DIAGNOSIS — Z8669 Personal history of other diseases of the nervous system and sense organs: Secondary | ICD-10-CM | POA: Diagnosis not present

## 2015-09-01 DIAGNOSIS — J45909 Unspecified asthma, uncomplicated: Secondary | ICD-10-CM | POA: Diagnosis not present

## 2015-09-01 DIAGNOSIS — Z87891 Personal history of nicotine dependence: Secondary | ICD-10-CM | POA: Insufficient documentation

## 2015-09-01 DIAGNOSIS — Z79899 Other long term (current) drug therapy: Secondary | ICD-10-CM | POA: Diagnosis not present

## 2015-09-01 DIAGNOSIS — J449 Chronic obstructive pulmonary disease, unspecified: Secondary | ICD-10-CM | POA: Insufficient documentation

## 2015-09-01 DIAGNOSIS — I10 Essential (primary) hypertension: Secondary | ICD-10-CM | POA: Insufficient documentation

## 2015-09-01 HISTORY — DX: Chronic obstructive pulmonary disease, unspecified: J44.9

## 2015-09-01 LAB — COMPREHENSIVE METABOLIC PANEL
ALBUMIN: 4 g/dL (ref 3.5–5.0)
ALT: 12 U/L — ABNORMAL LOW (ref 14–54)
AST: 18 U/L (ref 15–41)
Alkaline Phosphatase: 90 U/L (ref 38–126)
Anion gap: 5 (ref 5–15)
BILIRUBIN TOTAL: 0.3 mg/dL (ref 0.3–1.2)
BUN: 14 mg/dL (ref 6–20)
CHLORIDE: 105 mmol/L (ref 101–111)
CO2: 26 mmol/L (ref 22–32)
Calcium: 8.9 mg/dL (ref 8.9–10.3)
Creatinine, Ser: 0.91 mg/dL (ref 0.44–1.00)
GFR calc Af Amer: >60 mL/min (ref >60)
GFR calc non Af Amer: >60 mL/min (ref >60)
GLUCOSE: 107 mg/dL — AB (ref 65–99)
POTASSIUM: 3.9 mmol/L (ref 3.5–5.1)
Sodium: 136 mmol/L (ref 135–145)
TOTAL PROTEIN: 7.4 g/dL (ref 6.5–8.1)

## 2015-09-01 LAB — CBC
HEMATOCRIT: 41 % (ref 36.0–46.0)
Hemoglobin: 13.6 g/dL (ref 12.0–15.0)
MCH: 32.4 pg (ref 26.0–34.0)
MCHC: 33.2 g/dL (ref 30.0–36.0)
MCV: 97.6 fL (ref 78.0–100.0)
PLATELETS: 234 10*3/uL (ref 150–400)
RBC: 4.2 MIL/uL (ref 3.87–5.11)
RDW: 13.5 % (ref 11.5–15.5)
WBC: 8.4 10*3/uL (ref 4.0–10.5)

## 2015-09-01 LAB — TROPONIN I

## 2015-09-01 MED ORDER — NITROGLYCERIN 0.4 MG SL SUBL: 0.4000 mg | SUBLINGUAL_TABLET | SUBLINGUAL | Status: DC | PRN

## 2015-09-01 NOTE — ED Notes (Signed)
Pt states she has been having pain in the center of her chest moving down left arm since last night.  States it worsened today.

## 2015-09-01 NOTE — Discharge Instructions (Signed)
Tests were all normal. This still could be heart pain called angina. Recommend follow-up with cardiologist. Phone number given. Return if worse in anyway. Prescription for nitroglycerin given.

## 2015-09-01 NOTE — ED Provider Notes (Signed)
CSN: [id number]     Arrival date & time 09/01/15  1706 History   First MD Initiated Contact with Patient 09/01/15 1907     Chief Complaint  Patient presents with  . Chest Pain     (Consider location/radiation/quality/duration/timing/severity/associated sxs/prior Treatment) HPI.Marland KitchenMarland KitchenMarland KitchenAching chest pain early this morning with associated dyspnea, diaphoresis, nausea with radiation to left arm. Symptoms now abated. No previous history of coronary artery disease. No diabetes. No hypertension. Negative cholesterol. Quit smoking. Father had CABG in his mid 65s. Severity is moderate. Nothing makes symptoms better or worse.  Past Medical History  Diagnosis Date  . Hypertension   . Asthma   . Pneumonia   . GERD (gastroesophageal reflux disease)   . Headache(784.0)   . Bursitis of left hip     right  . Seizure ([place])     most recent 12/12/14  . Sleep apnea   . COPD (chronic obstructive pulmonary disease) [facility])    Past Surgical History  Procedure Laterality Date  . Abdominal hysterectomy    . Cholecystectomy    . Appendectomy    . Cataract extraction Bilateral   . Pars plana vitrectomy Right 08/27/2013    Procedure: PARS PLANA VITRECTOMY WITH 25 GAUGE;  Surgeon: Hayden Pedro, MD;  Location: [place];  Service: Ophthalmology;  Laterality: Right;  . Membrane peel Right 08/27/2013    Procedure: MEMBRANE PEEL;  Surgeon: Hayden Pedro, MD;  Location: [place];  Service: Ophthalmology;  Laterality: Right;  . Laser photo ablation Right 08/27/2013    Procedure: LASER PHOTO ABLATION;  Surgeon: Hayden Pedro, MD;  Location: [place];  Service: Ophthalmology;  Laterality: Right;  Headscope laser AND Endolaser  . Heal spur      right   Family History  Problem Relation Age of Onset  . Diabetes Father   . Hypertension Father   . Hyperlipidemia Father   . COPD Mother   . Diabetes Brother   . Cancer Paternal Uncle   . Stroke Paternal Grandmother   . Cancer Sister   . Colon cancer Neg Hx    Social  History  Substance Use Topics  . Smoking status: Former Smoker -- 1.00 packs/day for 42 years    Types: Cigarettes    Quit date: 03/20/2014  . Smokeless tobacco: Never Used     Comment: 01/21/15 restarted smoking 4 mos ago  . Alcohol Use: No   OB History    Gravida Para Term Preterm AB TAB SAB Ectopic Multiple Living   2 2 2       3      Review of Systems  All other systems reviewed and are negative.     Allergies  Bee venom; Amoxicillin-pot clavulanate; and Doxycycline hyclate  Home Medications   Prior to Admission medications   Medication Sig Start Date End Date Taking? Authorizing Provider  albuterol (PROVENTIL HFA;VENTOLIN HFA) 108 (90 BASE) MCG/ACT inhaler Inhale 2 puffs into the lungs every 6 (six) hours as needed for wheezing or shortness of breath. 10/25/14  Yes Tiffany A Gann, PA-C  cetirizine (ZYRTEC) 10 MG tablet Take 1 tablet (10 mg total) by mouth daily. 06/01/15  Yes Fransisca Kaufmann Dettinger, MD  Chlorpheniramine-PSE-Ibuprofen (ADVIL ALLERGY SINUS PO) Take by mouth.   Yes Historical Provider, MD  fluticasone (FLONASE) 50 MCG/ACT nasal spray Place 2 sprays into both nostrils daily. 06/01/15  Yes Fransisca Kaufmann Dettinger, MD  lacosamide (VIMPAT) 200 MG TABS tablet Take 1 tablet (200 mg total) by mouth 2 (two) times  daily. 01/21/15  Yes Penni Bombard, MD  omeprazole (PRILOSEC) 20 MG capsule Take 20 mg by mouth daily.   Yes Historical Provider, MD  tiotropium (SPIRIVA HANDIHALER) 18 MCG inhalation capsule Place 1 capsule (18 mcg total) into inhaler and inhale daily. 03/02/15  Yes Fransisca Kaufmann Dettinger, MD  nitroGLYCERIN (NITROSTAT) 0.4 MG SL tablet Place 1 tablet (0.4 mg total) under the tongue every 5 (five) minutes as needed for chest pain. 09/01/15   Nat Christen, MD  sulfamethoxazole-trimethoprim (BACTRIM DS) 800-160 MG tablet Take 1 tablet by mouth 2 (two) times daily. Patient not taking: Reported on 09/01/2015 07/15/15   Timmothy Euler, MD   BP 141/81 mmHg  Pulse 65  Temp(Src)  98.1 F (36.7 C) (Oral)  Resp 18  Ht 5\' 5"  (1.651 m)  Wt 230 lb (104.327 kg)  BMI 38.27 kg/m2  SpO2 99% Physical Exam  Constitutional: She is oriented to person, place, and time. She appears well-developed and well-nourished.  HENT:  Head: Normocephalic and atraumatic.  Eyes: Conjunctivae are normal.  Neck: Neck supple.  Cardiovascular: Normal rate and regular rhythm.   Pulmonary/Chest: Effort normal and breath sounds normal.  Abdominal: Soft. Bowel sounds are normal.  Musculoskeletal: Normal range of motion.  Neurological: She is alert and oriented to person, place, and time.  Skin: Skin is warm and dry.  Psychiatric: She has a normal mood and affect. Her behavior is normal.  Nursing note and vitals reviewed.   ED Course  Procedures (including critical care time) Labs Review Labs Reviewed  COMPREHENSIVE METABOLIC PANEL - Abnormal; Notable for the following:    Glucose, Bld 107 (*)    ALT 12 (*)    All other components within normal limits  CBC  TROPONIN I    Imaging Review Dg Chest 2 View  09/01/2015  CLINICAL DATA:  Chest pain since this morning.  Former smoker. EXAM: CHEST  2 VIEW COMPARISON:  05/03/2015 FINDINGS: Lungs are adequately inflated without consolidation or effusion. Cardiomediastinal silhouette is within normal. There are degenerative changes of the spine. IMPRESSION: No active cardiopulmonary disease. Electronically Signed   By: Marin Olp M.D.   On: 09/01/2015 18:03   I have personally reviewed and evaluated these images and lab results as part of my medical decision-making.   EKG Interpretation   Date/Time:  Thursday September 01 2015 17:12:04 EDT Ventricular Rate:  65 PR Interval:  190 QRS Duration: 100 QT Interval:  414 QTC Calculation: 430 R Axis:   -7 Text Interpretation:  Normal sinus rhythm Possible Left atrial enlargement  Incomplete right bundle branch block Borderline ECG Confirmed by Lacinda Axon  MD,  Mccall Will ([id number]) on 09/01/2015 7:23:33 PM       MDM   Final diagnoses:  Chest pain, unspecified chest pain type    Risk factor profile for CAD or PE is relatively modest. Symptoms could have been an anginal spell. She is completely symptom free at this time with no intervention. Screening tests including labs, EKG, chest x-ray, troponin all negative. Rx nitroglycerin tablets. Recommended referral to cardiology. Phone number given.    Nat Christen, MD 09/01/15 2038

## 2015-09-22 ENCOUNTER — Other Ambulatory Visit: Payer: Self-pay | Admitting: Family Medicine

## 2015-09-22 DIAGNOSIS — J439 Emphysema, unspecified: Secondary | ICD-10-CM

## 2015-11-04 ENCOUNTER — Other Ambulatory Visit: Payer: Self-pay

## 2015-11-04 DIAGNOSIS — R062 Wheezing: Secondary | ICD-10-CM

## 2015-11-04 MED ORDER — ALBUTEROL SULFATE HFA 108 (90 BASE) MCG/ACT IN AERS: 2.0000 | INHALATION_SPRAY | Freq: Four times a day (QID) | RESPIRATORY_TRACT | 0 refills | Status: DC | PRN

## 2015-11-09 ENCOUNTER — Encounter: Payer: Self-pay | Admitting: *Deleted

## 2015-11-25 ENCOUNTER — Ambulatory Visit (INDEPENDENT_AMBULATORY_CARE_PROVIDER_SITE_OTHER): Payer: BLUE CROSS/BLUE SHIELD | Admitting: Family Medicine

## 2015-11-25 ENCOUNTER — Ambulatory Visit: Payer: BLUE CROSS/BLUE SHIELD | Admitting: Family Medicine

## 2015-11-25 ENCOUNTER — Encounter: Payer: Self-pay | Admitting: Family Medicine

## 2015-11-25 VITALS — BP 133/88 | HR 66 | Temp 99.2°F | Ht 65.0 in | Wt 203.2 lb

## 2015-11-25 DIAGNOSIS — J441 Chronic obstructive pulmonary disease with (acute) exacerbation: Secondary | ICD-10-CM

## 2015-11-25 MED ORDER — PREDNISONE 20 MG PO TABS: 40.0000 mg | ORAL_TABLET | Freq: Every day | ORAL | 0 refills | Status: DC

## 2015-11-25 MED ORDER — LEVOFLOXACIN 500 MG PO TABS: 500.0000 mg | ORAL_TABLET | Freq: Every day | ORAL | 0 refills | Status: DC

## 2015-11-25 NOTE — Patient Instructions (Signed)
Great to see you!  Take all of the antibiotics  Please call or come back if you are not getting better as expected  I will follow up on getting your CT scheduled  Chronic Obstructive Pulmonary Disease Chronic obstructive pulmonary disease (COPD) is a common lung condition in which airflow from the lungs is limited. COPD is a general term that can be used to describe many different lung problems that limit airflow, including both chronic bronchitis and emphysema. If you have COPD, your lung function will probably never return to normal, but there are measures you can take to improve lung function and make yourself feel better. CAUSES   Smoking (common).  Exposure to secondhand smoke.  Genetic problems.  Chronic inflammatory lung diseases or recurrent infections. SYMPTOMS  Shortness of breath, especially with physical activity.  Deep, persistent (chronic) cough with a large amount of thick mucus.  Wheezing.  Rapid breaths (tachypnea).  Gray or bluish discoloration (cyanosis) of the skin, especially in your fingers, toes, or lips.  Fatigue.  Weight loss.  Frequent infections or episodes when breathing symptoms become much worse (exacerbations).  Chest tightness. DIAGNOSIS Your health care provider will take a medical history and perform a physical examination to diagnose COPD. Additional tests for COPD may include:  Lung (pulmonary) function tests.  Chest X-ray.  CT scan.  Blood tests. TREATMENT  Treatment for COPD may include:  Inhaler and nebulizer medicines. These help manage the symptoms of COPD and make your breathing more comfortable.  Supplemental oxygen. Supplemental oxygen is only helpful if you have a low oxygen level in your blood.  Exercise and physical activity. These are beneficial for nearly all people with COPD.  Lung surgery or transplant.  Nutrition therapy to gain weight, if you are underweight.  Pulmonary rehabilitation. This may involve  working with a team of health care providers and specialists, such as respiratory, occupational, and physical therapists. HOME CARE INSTRUCTIONS  Take all medicines (inhaled or pills) as directed by your health care provider.  Avoid over-the-counter medicines or cough syrups that dry up your airway (such as antihistamines) and slow down the elimination of secretions unless instructed otherwise by your health care provider.  If you are a smoker, the most important thing that you can do is stop smoking. Continuing to smoke will cause further lung damage and breathing trouble. Ask your health care provider for help with quitting smoking. He or she can direct you to community resources or hospitals that provide support.  Avoid exposure to irritants such as smoke, chemicals, and fumes that aggravate your breathing.  Use oxygen therapy and pulmonary rehabilitation if directed by your health care provider. If you require home oxygen therapy, ask your health care provider whether you should purchase a pulse oximeter to measure your oxygen level at home.  Avoid contact with individuals who have a contagious illness.  Avoid extreme temperature and humidity changes.  Eat healthy foods. Eating smaller, more frequent meals and resting before meals may help you maintain your strength.  Stay active, but balance activity with periods of rest. Exercise and physical activity will help you maintain your ability to do things you want to do.  Preventing infection and hospitalization is very important when you have COPD. Make sure to receive all the vaccines your health care provider recommends, especially the pneumococcal and influenza vaccines. Ask your health care provider whether you need a pneumonia vaccine.  Learn and use relaxation techniques to manage stress.  Learn and use controlled breathing  techniques as directed by your health care provider. Controlled breathing techniques include:  Pursed lip  breathing. Start by breathing in (inhaling) through your nose for 1 second. Then, purse your lips as if you were going to whistle and breathe out (exhale) through the pursed lips for 2 seconds.  Diaphragmatic breathing. Start by putting one hand on your abdomen just above your waist. Inhale slowly through your nose. The hand on your abdomen should move out. Then purse your lips and exhale slowly. You should be able to feel the hand on your abdomen moving in as you exhale.  Learn and use controlled coughing to clear mucus from your lungs. Controlled coughing is a series of short, progressive coughs. The steps of controlled coughing are: 1. Lean your head slightly forward. 2. Breathe in deeply using diaphragmatic breathing. 3. Try to hold your breath for 3 seconds. 4. Keep your mouth slightly open while coughing twice. 5. Spit any mucus out into a tissue. 6. Rest and repeat the steps once or twice as needed. SEEK MEDICAL CARE IF:  You are coughing up more mucus than usual.  There is a change in the color or thickness of your mucus.  Your breathing is more labored than usual.  Your breathing is faster than usual. SEEK IMMEDIATE MEDICAL CARE IF:  You have shortness of breath while you are resting.  You have shortness of breath that prevents you from:  Being able to talk.  Performing your usual physical activities.  You have chest pain lasting longer than 5 minutes.  Your skin color is more cyanotic than usual.  You measure low oxygen saturations for longer than 5 minutes with a pulse oximeter. MAKE SURE YOU:  Understand these instructions.  Will watch your condition.  Will get help right away if you are not doing well or get worse.   This information is not intended to replace advice given to you by your health care provider. Make sure you discuss any questions you have with your health care provider.   Document Released: 11/08/2004 Document Revised: 02/19/2014 Document  Reviewed: 09/25/2012 Elsevier Interactive Patient Education [employer].

## 2015-11-25 NOTE — Progress Notes (Signed)
   HPI  Patient presents today here with cough.  Patient explains that last one week she's had productive c wheezing, mild shortness of breath, and bilateral thoracic back pain worse with deep inspiration. Back pain described as sharp, it hurts with palpation, deep inspiration, and movements.  She's concerned given that she had a previous pulmonary  nodule that needs follow-up.  She denies fever or difficulty tolerating food or fluid.   PMH: Smoking status noted ROS: Per HPI  Objective: BP 133/88   Pulse 66   Temp 99.2 F (37.3 C) (Oral)   Ht 5\' 5"  (1.651 m)   Wt 203 lb 4 oz (92.2 kg)   BMI 33.82 kg/m  Gen: NAD, alert, cooperative with exam HEENT: NCAT CV: RRR, good S1/S2, no murmur Resp: normal air movement, scattered expiratory coarse sounds, no significant wheezing Ext: No edema, warm Neuro: Alert and oriented, No gross deficits  MSK:  Tenderness to palp of BL paraspinal muscles in thoracic area  Assessment and plan:  # COPD exacerbation  Possible developing CAP, possible pleurisy vs MSK pain.  Levaquin, prednisone (augmentin and doxy allergy) Follow up on previous CT- ordered in 05/2015 Return precautions reviewed    Meds ordered this encounter  Medications  . levofloxacin (LEVAQUIN) 500 MG tablet    Sig: Take 1 tablet (500 mg total) by mouth daily.    Dispense:  7 tablet    Refill:  0  . predniSONE (DELTASONE) 20 MG tablet    Sig: Take 2 tablets (40 mg total) by mouth daily with breakfast.    Dispense:  10 tablet    Refill:  0    Laroy Apple, MD [facility] 11/25/2015, 5:58 PM

## 2015-12-01 ENCOUNTER — Ambulatory Visit: Payer: BLUE CROSS/BLUE SHIELD | Admitting: Family Medicine

## 2015-12-05 ENCOUNTER — Ambulatory Visit (HOSPITAL_COMMUNITY)
Admission: RE | Admit: 2015-12-05 | Discharge: 2015-12-05 | Disposition: A | Discharge status: Home / Self Care | Payer: BLUE CROSS/BLUE SHIELD | Source: Ambulatory Visit | Attending: Family Medicine | Admitting: Family Medicine

## 2015-12-05 DIAGNOSIS — R918 Other nonspecific abnormal finding of lung field: Secondary | ICD-10-CM | POA: Insufficient documentation

## 2015-12-05 DIAGNOSIS — R911 Solitary pulmonary nodule: Secondary | ICD-10-CM

## 2015-12-05 DIAGNOSIS — J439 Emphysema, unspecified: Secondary | ICD-10-CM | POA: Insufficient documentation

## 2015-12-05 LAB — POCT I-STAT CREATININE: CREATININE: 1 mg/dL (ref 0.44–1.00)

## 2015-12-05 MED ORDER — IOPAMIDOL (ISOVUE-300) INJECTION 61%
75.0000 mL | Freq: Once | INTRAVENOUS | Status: AC | PRN
  Administered 2015-12-05: 75 mL via INTRAVENOUS

## 2015-12-06 ENCOUNTER — Other Ambulatory Visit: Payer: Self-pay | Admitting: Family Medicine

## 2015-12-07 NOTE — Telephone Encounter (Signed)
Pt aware abx can not be refilled. appt made for tomorrow

## 2015-12-08 ENCOUNTER — Encounter: Payer: Self-pay | Admitting: Family Medicine

## 2015-12-08 ENCOUNTER — Ambulatory Visit (INDEPENDENT_AMBULATORY_CARE_PROVIDER_SITE_OTHER): Payer: BLUE CROSS/BLUE SHIELD | Admitting: Family Medicine

## 2015-12-08 VITALS — BP 128/81 | HR 70 | Temp 98.0°F | Ht 65.0 in | Wt 232.2 lb

## 2015-12-08 DIAGNOSIS — J439 Emphysema, unspecified: Secondary | ICD-10-CM

## 2015-12-08 DIAGNOSIS — R918 Other nonspecific abnormal finding of lung field: Secondary | ICD-10-CM | POA: Insufficient documentation

## 2015-12-08 MED ORDER — BUDESONIDE-FORMOTEROL FUMARATE 160-4.5 MCG/ACT IN AERO: 2.0000 | INHALATION_SPRAY | Freq: Two times a day (BID) | RESPIRATORY_TRACT | 0 refills | Status: DC

## 2015-12-08 NOTE — Progress Notes (Signed)
   HPI  Patient presents today here with cough.  Patient states that after her treatment a couple weeks ago with Levaquin and prednisone she had some improvement still has a neck pain cough and shortness of breath.  She has good response to albuterol.  We also reviewed her CT scan, there is a new nodule is 4 mm, they recommend repeat CT scan in 6 months. She understands this.  She understands emphysema and has no questions about it.  Concerned about her weight gain and would like to avoid prednisone if possible.  PMH: Smoking status noted ROS: Per HPI  Objective: BP 128/81   Pulse 70   Temp 98 F (36.7 C) (Oral)   Ht 5\' 5"  (1.651 m)   Wt 232 lb 3.2 oz (105.3 kg)   BMI 38.64 kg/m  Gen: NAD, alert, cooperative with exam HEENT: NCAT CV: RRR, good S1/S2, no murmur Resp: CTABL, no wheezes, non-labored Ext: No edema, warm Neuro: Alert and oriented, No gross deficits  Assessment and plan:  # Pulmonary emphysema Likely resolving acute flare I'm concerned that she is actually had a worsening of her underlying lung disease that is lingering on his arms Starting Symbicort with samples for 6 weeks She will call for a full prescription if she feels that there is a benefit after stopping after 6 weeks.   Pulmonary nodule Repeat CT scan in 6 months, discussed the patient  Meds ordered this encounter  Medications  . budesonide-formoterol (SYMBICORT) 160-4.5 MCG/ACT inhaler    Sig: Inhale 2 puffs into the lungs 2 (two) times daily.    Dispense:  3 Inhaler    Refill:  0    Laroy Apple, MD [facility] 12/08/2015, 3:50 PM

## 2015-12-08 NOTE — Patient Instructions (Signed)
Great to see you!    Try symbicort 2 puffs twice daily for 6 weeks, call if you feel lik eyou need a long term prescription.

## 2015-12-27 ENCOUNTER — Ambulatory Visit: Payer: BLUE CROSS/BLUE SHIELD | Admitting: Family Medicine

## 2015-12-30 ENCOUNTER — Other Ambulatory Visit: Payer: Self-pay | Admitting: Family Medicine

## 2015-12-30 DIAGNOSIS — J439 Emphysema, unspecified: Secondary | ICD-10-CM

## 2016-01-02 ENCOUNTER — Ambulatory Visit (INDEPENDENT_AMBULATORY_CARE_PROVIDER_SITE_OTHER): Payer: BLUE CROSS/BLUE SHIELD | Admitting: Family Medicine

## 2016-01-02 ENCOUNTER — Encounter: Payer: Self-pay | Admitting: Family Medicine

## 2016-01-02 VITALS — BP 125/88 | HR 65 | Temp 97.2°F | Ht 65.0 in | Wt 233.0 lb

## 2016-01-02 DIAGNOSIS — L732 Hidradenitis suppurativa: Secondary | ICD-10-CM

## 2016-01-02 MED ORDER — SULFAMETHOXAZOLE-TRIMETHOPRIM 800-160 MG PO TABS: 1.0000 | ORAL_TABLET | Freq: Two times a day (BID) | ORAL | 0 refills | Status: DC

## 2016-01-02 NOTE — Progress Notes (Signed)
   BP 125/88   Pulse 65   Temp 97.2 F (36.2 C) (Oral)   Ht 5\' 5"  (1.651 m)   Wt 233 lb (105.7 kg)   BMI 38.77 kg/m    Subjective:    Patient ID: Courtney Mcconnell, female    DOB: April 26, 1955, 60 y.o.   MRN: [id number]  HPI: Courtney Mcconnell is a 60 y.o. female presenting on 01/02/2016 for Mass (bilateral axilla, itches, atleast 2 weeks)   HPI Lumps under her axilla Patient comes in today because she has for the past 2 weeks been developing some lumps under her axilla bilaterally that have been very tender over the past 2 days. She denies any fevers or chills or redness or warmth anywhere else but just under the axilla. She has not had this previously. They are 8 out of 10 pain. She has been using the same deodorant that she has used previously but has stopped using over the past couple days.  Relevant past medical, surgical, family and social history reviewed and updated as indicated. Interim medical history since our last visit reviewed. Allergies and medications reviewed and updated.  Review of Systems  Constitutional: Negative for chills and fever.  Respiratory: Negative for chest tightness and shortness of breath.   Cardiovascular: Negative for chest pain and leg swelling.  Genitourinary: Negative for difficulty urinating and dysuria.  Skin: Positive for color change and rash.  Neurological: Negative for light-headedness and headaches.  Psychiatric/Behavioral: Negative for agitation and behavioral problems.  All other systems reviewed and are negative.   Per HPI unless specifically indicated above      Objective:    BP 125/88   Pulse 65   Temp 97.2 F (36.2 C) (Oral)   Ht 5\' 5"  (1.651 m)   Wt 233 lb (105.7 kg)   BMI 38.77 kg/m   Wt Readings from Last 3 Encounters:  01/02/16 233 lb (105.7 kg)  12/08/15 232 lb 3.2 oz (105.3 kg)  11/25/15 203 lb 4 oz (92.2 kg)    Physical Exam  Constitutional: She is oriented to person, place, and time. She appears well-developed and  well-nourished. No distress.  Eyes: Conjunctivae are normal.  Musculoskeletal: Normal range of motion. She exhibits no edema or tenderness.  Neurological: She is alert and oriented to person, place, and time. Coordination normal.  Skin: Skin is warm and dry. Rash noted. Rash is nodular (Swollen inflamed nodule about 1 cm in size, about 3-4 under each axilla. No drainage from any of them. Very tender to palpation. No erythema or warmth anywhere else outside the axilla.). She is not diaphoretic.  Psychiatric: She has a normal mood and affect. Her behavior is normal.  Nursing note and vitals reviewed.     Assessment & Plan:   Problem List Items Addressed This Visit    None    Visit Diagnoses    Hidradenitis suppurativa    -  Primary   Bilateral, 4-5 spots in each axilla, smaller, will try antibiotic   Relevant Medications   sulfamethoxazole-trimethoprim (BACTRIM DS,SEPTRA DS) 800-160 MG tablet       Follow up plan: Return if symptoms worsen or fail to improve.  Counseling provided for all of the vaccine components No orders of the defined types were placed in this encounter.   Caryl Pina, MD [facility] 01/02/2016, 10:39 AM

## 2016-04-02 ENCOUNTER — Ambulatory Visit: Payer: BLUE CROSS/BLUE SHIELD | Admitting: Obstetrics and Gynecology

## 2016-04-20 ENCOUNTER — Ambulatory Visit (INDEPENDENT_AMBULATORY_CARE_PROVIDER_SITE_OTHER): Payer: BLUE CROSS/BLUE SHIELD

## 2016-04-20 ENCOUNTER — Ambulatory Visit (INDEPENDENT_AMBULATORY_CARE_PROVIDER_SITE_OTHER): Payer: BLUE CROSS/BLUE SHIELD | Admitting: Pediatrics

## 2016-04-20 ENCOUNTER — Encounter: Payer: Self-pay | Admitting: Pediatrics

## 2016-04-20 VITALS — BP 126/84 | HR 68 | Temp 97.3°F | Resp 20 | Ht 65.0 in | Wt 234.6 lb

## 2016-04-20 DIAGNOSIS — R05 Cough: Secondary | ICD-10-CM | POA: Diagnosis not present

## 2016-04-20 DIAGNOSIS — R062 Wheezing: Secondary | ICD-10-CM | POA: Diagnosis not present

## 2016-04-20 DIAGNOSIS — R059 Cough, unspecified: Secondary | ICD-10-CM

## 2016-04-20 DIAGNOSIS — J209 Acute bronchitis, unspecified: Secondary | ICD-10-CM

## 2016-04-20 DIAGNOSIS — J440 Chronic obstructive pulmonary disease with acute lower respiratory infection: Secondary | ICD-10-CM | POA: Diagnosis not present

## 2016-04-20 MED ORDER — ALBUTEROL SULFATE HFA 108 (90 BASE) MCG/ACT IN AERS: 2.0000 | INHALATION_SPRAY | Freq: Four times a day (QID) | RESPIRATORY_TRACT | 0 refills | Status: DC | PRN

## 2016-04-20 MED ORDER — TIOTROPIUM BROMIDE MONOHYDRATE 18 MCG IN CAPS: 18.0000 ug | ORAL_CAPSULE | Freq: Every day | RESPIRATORY_TRACT | 3 refills | Status: DC

## 2016-04-20 MED ORDER — SPACER/AERO CHAMBER MOUTHPIECE MISC: 1.0000 | Freq: Four times a day (QID) | 0 refills | Status: DC | PRN

## 2016-04-20 MED ORDER — LEVOFLOXACIN 500 MG PO TABS: 500.0000 mg | ORAL_TABLET | Freq: Every day | ORAL | 0 refills | Status: DC

## 2016-04-20 NOTE — Progress Notes (Signed)
Subjective:   Patient ID: Courtney Mcconnell, female    DOB: [birth year], 61 y.o.   MRN: [id number] CC: rib Pain (Right)  HPI: Courtney Mcconnell is a 61 y.o. female presenting for rib Pain (Right)  Two days ago started with congestion and coughing Smoking now, 1/2 ppd Two nights ago felt warmer than usual Past two days appetite down, still able to eat some Felt slightly nauseous last night A lot of pressure in sinuses, face Coughing, sometimes dry, sometimes productive, feels like she cant get mucus up Feeling more SOB Has pain in lower sides of ribs b/l when she takes deep breaths, worse with coughing Says the last time felt like this ended up in the hospital for COPD exacerbation/pneumonia Has symbicort at home, has been using last coupl eof days, not regularly Doesn't have albuterol at home Ran out of spiriva Thinks the spiriva helped her quit smoking Thinks she has been wheezing some at home No chest pain with exertion  Quit smoking, has started back in last few months as above  Relevant past medical, surgical, family and social history reviewed. Allergies and medications reviewed and updated. History  Smoking Status  . Current Every Day Smoker  . Packs/day: 1.00  . Years: 42.00  . Types: Cigarettes  . Last attempt to quit: 03/20/2014  Smokeless Tobacco  . Never Used    Comment: 01/21/15 restarted smoking 4 mos ago   ROS: Per HPI   Objective:    BP 126/84   Pulse 68   Temp 97.3 F (36.3 C) (Oral)   Resp 20   Ht 5\' 5"  (1.651 m)   Wt 234 lb 9.6 oz (106.4 kg)   SpO2 97%   BMI 39.04 kg/m   Wt Readings from Last 3 Encounters:  04/20/16 234 lb 9.6 oz (106.4 kg)  01/02/16 233 lb (105.7 kg)  12/08/15 232 lb 3.2 oz (105.3 kg)    Gen: NAD, alert, cooperative with exam, NCAT, coughing, congested EYES: EOMI, no conjunctival injection, or no icterus ENT:  TMs dull gray b/l, OP without erythema, ttp over max sinuses b/l LYMPH: no cervical LAD CV: NRRR, normal S1/S2, no  murmur, distal pulses 2+ b/l Resp: CTABL, no wheezes, moving air fair, normal WOB Abd: +BS, soft, NTND.  Ext: No edema, warm Neuro: Alert and oriented  CXR "IMPRESSION: 1. Mild bronchitic changes. 2.  No evidence for acute cardiopulmonary abnormality."  Assessment & Plan:  Charrise was seen today for rib pain, coughing, congestion.  Diagnoses and all orders for this visit:  Acute bronchitis with COPD ([place]) No acute infiltrate on xray Doesn't have albuteorl at home No wheezing now on exam Thinks is wheezing at home, h/o COPD exacerbations Will refill spiriva, felt like breathing was better when taking regularly Also albuteorl, use with spacer If no improvement with albuterol over next couple of days, start abx Discussed return precautions -     tiotropium (SPIRIVA HANDIHALER) 18 MCG inhalation capsule; Place 1 capsule (18 mcg total) into inhaler and inhale daily. -     levofloxacin (LEVAQUIN) 500 MG tablet; Take 1 tablet (500 mg total) by mouth daily.  Cough -     DG Chest 2 View; Future  Wheezing -     albuterol (PROVENTIL HFA;VENTOLIN HFA) 108 (90 Base) MCG/ACT inhaler; Inhale 2 puffs into the lungs every 6 (six) hours as needed for wheezing or shortness of breath. -     Spacer/Aero Chamber Mouthpiece MISC; 1 each by Does not apply route every  6 (six) hours as needed.   Follow up plan: F/u with pcp 2 mo, sooner if needed Assunta Found, MD [place]

## 2016-05-07 ENCOUNTER — Other Ambulatory Visit: Payer: Self-pay | Admitting: *Deleted

## 2016-05-07 DIAGNOSIS — J439 Emphysema, unspecified: Secondary | ICD-10-CM

## 2016-05-07 MED ORDER — CETIRIZINE HCL 10 MG PO TABS: 10.0000 mg | ORAL_TABLET | Freq: Every day | ORAL | 1 refills | Status: DC

## 2016-05-25 ENCOUNTER — Encounter (HOSPITAL_COMMUNITY): Payer: Self-pay

## 2016-05-25 ENCOUNTER — Emergency Department (HOSPITAL_COMMUNITY)
Admission: EM | Admit: 2016-05-25 | Discharge: 2016-05-26 | Disposition: A | Discharge status: Home / Self Care | Payer: BLUE CROSS/BLUE SHIELD | Attending: Emergency Medicine | Admitting: Emergency Medicine

## 2016-05-25 DIAGNOSIS — Z79899 Other long term (current) drug therapy: Secondary | ICD-10-CM | POA: Diagnosis not present

## 2016-05-25 DIAGNOSIS — I10 Essential (primary) hypertension: Secondary | ICD-10-CM | POA: Diagnosis not present

## 2016-05-25 DIAGNOSIS — F17210 Nicotine dependence, cigarettes, uncomplicated: Secondary | ICD-10-CM | POA: Insufficient documentation

## 2016-05-25 DIAGNOSIS — J449 Chronic obstructive pulmonary disease, unspecified: Secondary | ICD-10-CM | POA: Insufficient documentation

## 2016-05-25 DIAGNOSIS — J40 Bronchitis, not specified as acute or chronic: Secondary | ICD-10-CM | POA: Insufficient documentation

## 2016-05-25 DIAGNOSIS — R05 Cough: Secondary | ICD-10-CM | POA: Diagnosis present

## 2016-05-25 NOTE — ED Triage Notes (Addendum)
Patient states that she woke up coughing this morning.  States that her back hurts and her throat hurts from coughing.  Coughing up yellow mucus per pt.  Patient denies difficulty breathing or shortness of breath.

## 2016-05-25 NOTE — ED Notes (Signed)
Pt placed in a gown. Pt ambulated w/o difficulty to the restroom.

## 2016-05-25 NOTE — ED Triage Notes (Signed)
Flank pain since last night- pt immediately to bathroom upon arrival  PCP [place]

## 2016-05-25 NOTE — ED Provider Notes (Signed)
[facility] DEPT Provider Note   CSN: [id number] Arrival date & time: 05/25/16  2256   By signing my name below, I, Eunice Blase, attest that this documentation has been prepared under the direction and in the presence of Ezequiel Essex, MD. Electronically signed, Eunice Blase, ED Scribe. 05/26/16. 12:10 AM.   History   Chief Complaint Chief Complaint  Patient presents with  . Cough   The history is provided by the patient and medical records. No language interpreter was used.    CHANTAY WHITELOCK is a 61 y.o. female with h/o COPD and tobacco use, transported via her husband to the Emergency Department with concern for gradually worsening productive cough with white sputum since waking today. She notes associated sore throat, rhinorrhea, R sided chest pain d/t cough, constant back L > R intercostal pain exacerbated with palpation and cough, myalgias. Pt notes occasional inhaler for COPD and and vimpat use for h/o seizures. Pt reports no inhaler, tylenol, ibuprofen use today to treat symptoms. Pt with normal solid/fluid intake, stool/urine output. No flu vaccination this season. She denies fever, SOB significant from baseline, N/V/D, h/o DM, heart disorders or stents. Pt reports 2 granddaughters with walking pneumonia recently. PCP noted.  Past Medical History:  Diagnosis Date  . Asthma   . Bursitis of left hip    right  . COPD (chronic obstructive pulmonary disease) ([place])   . GERD (gastroesophageal reflux disease)   . Headache(784.0)   . Hypertension   . Pneumonia   . Seizure ([place])    most recent 12/12/14  . Sleep apnea     Patient Active Problem List   Diagnosis Date Noted  . Pulmonary nodules 12/08/2015  . 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 Healthcare maintenance 07/06/2015  . Sleep apnea 04/06/2015  . Pulmonary emphysema ([place]) 03/02/2015  . Smoker 02/16/2015  . Seizure disorder ([place]) 01/21/2015  . Focal epilepsy with impairment of consciousness ([place])  12/13/2014  . Seizures ([place]) 04/18/2014  . Acute renal failure ([place]) 04/18/2014  . HTN (hypertension) 04/18/2014  . 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 Tobacco use disorder 07/19/2012    Past Surgical History:  Procedure Laterality Date  . ABDOMINAL HYSTERECTOMY    . APPENDECTOMY    . CATARACT EXTRACTION Bilateral   . CHOLECYSTECTOMY    . heal spur     right  . LASER PHOTO ABLATION Right 08/27/2013   Procedure: LASER PHOTO ABLATION;  Surgeon: Hayden Pedro, MD;  Location: [facility];  Service: Ophthalmology;  Laterality: Right;  Headscope laser AND Endolaser  . MEMBRANE PEEL Right 08/27/2013   Procedure: MEMBRANE PEEL;  Surgeon: Hayden Pedro, MD;  Location: [facility];  Service: Ophthalmology;  Laterality: Right;  . PARS PLANA VITRECTOMY Right 08/27/2013   Procedure: PARS PLANA VITRECTOMY WITH 25 GAUGE;  Surgeon: Hayden Pedro, MD;  Location: [facility];  Service: Ophthalmology;  Laterality: Right;    OB History    Gravida Para Term Preterm AB Living   2 2 2     3    SAB TAB Ectopic Multiple Live Births                   Home Medications    Prior to Admission medications   Medication Sig Start Date End Date Taking? Authorizing Provider  albuterol (PROVENTIL HFA;VENTOLIN HFA) 108 (90 Base) MCG/ACT inhaler Inhale 2 puffs into the lungs every 6 (six) hours as needed for wheezing or shortness of breath. 04/20/16   Berlin Hun  Evette Doffing, MD  budesonide-formoterol [facility]) 160-4.5 MCG/ACT inhaler Inhale 2 puffs into the lungs 2 (two) times daily. 12/08/15   Timmothy Euler, MD  cetirizine (ZYRTEC) 10 MG tablet Take 1 tablet (10 mg total) by mouth daily. 05/07/16   Fransisca Kaufmann Dettinger, MD  fluticasone (FLONASE) 50 MCG/ACT nasal spray PLACE 2 SPRAYS INTO BOTH NOSTRILS DAILY. 12/30/15   Fransisca Kaufmann Dettinger, MD  lacosamide (VIMPAT) 200 MG TABS tablet Take 1 tablet (200 mg total) by mouth 2 (two) times daily. 01/21/15   Penni Bombard, MD  levofloxacin (LEVAQUIN) 500 MG tablet Take 1 tablet  (500 mg total) by mouth daily. 04/20/16   Eustaquio Maize, MD  nitroGLYCERIN (NITROSTAT) 0.4 MG SL tablet Place 1 tablet (0.4 mg total) under the tongue every 5 (five) minutes as needed for chest pain. Patient not taking: Reported on 04/20/2016 09/01/15   Nat Christen, MD  omeprazole (PRILOSEC) 20 MG capsule Take 20 mg by mouth daily.    Historical Provider, MD  Spacer/Aero Chamber Mouthpiece MISC 1 each by Does not apply route every 6 (six) hours as needed. 04/20/16   Eustaquio Maize, MD  tiotropium (SPIRIVA HANDIHALER) 18 MCG inhalation capsule Place 1 capsule (18 mcg total) into inhaler and inhale daily. 04/20/16   Eustaquio Maize, MD    Family History Family History  Problem Relation Age of Onset  . Diabetes Father   . Hypertension Father   . Hyperlipidemia Father   . COPD Mother   . Diabetes Brother   . Stroke Paternal Grandmother   . Cancer Sister   . Cancer Paternal Uncle   . Colon cancer Neg Hx     Social History Social History  Substance Use Topics  . Smoking status: Current Every Day Smoker    Packs/day: 1.00    Years: 42.00    Types: Cigarettes    Last attempt to quit: 03/20/2014  . Smokeless tobacco: Never Used     Comment: 01/21/15 restarted smoking 4 mos ago  . Alcohol use No     Allergies   Bee venom; Amoxicillin-pot clavulanate; and Doxycycline hyclate   Review of Systems Review of Systems All other systems reviewed and all systems are negative for acute changes except as noted in the HPI and PMH.    Physical Exam Updated Vital Signs BP (!) 157/75 (BP Location: Right Arm)   Pulse 69   Temp 99 F (37.2 C) (Oral)   Resp 16   Ht 5\' 6"  (1.676 m)   Wt 234 lb (106.1 kg)   SpO2 99%   BMI 37.77 kg/m   Physical Exam  Constitutional: She is oriented to person, place, and time. She appears well-developed and well-nourished. No distress.  HENT:  Head: Normocephalic and atraumatic.  Mouth/Throat: Oropharynx is clear and moist. No oropharyngeal exudate.  Eyes:  Conjunctivae and EOM are normal. Pupils are equal, round, and reactive to light.  Neck: Normal range of motion. Neck supple.  No meningismus.  Cardiovascular: Normal rate, regular rhythm, normal heart sounds and intact distal pulses.   No murmur heard. Pulmonary/Chest: Effort normal and breath sounds normal. No respiratory distress. She exhibits tenderness.  Dry cough, L parasp thoracic back pain, chest wall pain  Abdominal: Soft. There is no tenderness. There is no rebound and no guarding.  Musculoskeletal: Normal range of motion. She exhibits no edema or tenderness.  Neurological: She is alert and oriented to person, place, and time. No cranial nerve deficit. She exhibits normal muscle tone. Coordination  normal.   5/5 strength throughout. CN 2-12 intact.Equal grip strength.   Skin: Skin is warm.  Psychiatric: She has a normal mood and affect. Her behavior is normal.  Nursing note and vitals reviewed.    ED Treatments / Results  DIAGNOSTIC STUDIES: Oxygen Saturation is 99% on RA, NL by my interpretation.    COORDINATION OF CARE: 12:02 AM Discussed treatment plan with pt at bedside and pt agreed to plan. Will order medication.  Labs (all labs ordered are listed, but only abnormal results are displayed) Labs Reviewed  URINALYSIS, ROUTINE W REFLEX MICROSCOPIC - Abnormal; Notable for the following:       Result Value   Color, Urine STRAW (*)    Specific Gravity, Urine 1.004 (*)    All other components within normal limits  D-DIMER, QUANTITATIVE (NOT AT [facility]) - Abnormal; Notable for the following:    D-Dimer, Quant 0.68 (*)    All other components within normal limits  BASIC METABOLIC PANEL - Abnormal; Notable for the following:    Glucose, Bld 109 (*)    All other components within normal limits  TROPONIN I  CBC WITH DIFFERENTIAL/PLATELET    EKG  EKG Interpretation  Date/Time:  Saturday May 26 2016 00:11:46 EDT Ventricular Rate:  67 PR Interval:    QRS Duration: 105 QT  Interval:  416 QTC Calculation: 440 R Axis:   -162 Text Interpretation:  Sinus rhythm Right axis deviation Abnormal R-wave progression, early transition Abnormal T, consider ischemia, lateral leads No significant change was found Confirmed by Wyvonnia Dusky  MD, Malkia Nippert [phone]) on 05/26/2016 12:46:51 AM       Radiology Dg Chest 2 View  Result Date: 05/26/2016 CLINICAL DATA:  Cough EXAM: CHEST  2 VIEW COMPARISON:  Chest radiograph 04/20/2016 FINDINGS: The heart size and mediastinal contours are within normal limits. Both lungs are clear. Unchanged mild peribronchial opacities in the lower lungs. The visualized skeletal structures are unremarkable. IMPRESSION: No active cardiopulmonary disease. Electronically Signed   By: Ulyses Jarred M.D.   On: 05/26/2016 01:06   Ct Angio Chest Pe W And/or Wo Contrast  Result Date: 05/26/2016 CLINICAL DATA:  Acute onset of worsening productive cough, sore throat, rhinorrhea and right-sided chest pain. Initial encounter. EXAM: CT ANGIOGRAPHY CHEST WITH CONTRAST TECHNIQUE: Multidetector CT imaging of the chest was performed using the standard protocol during bolus administration of intravenous contrast. Multiplanar CT image reconstructions and MIPs were obtained to evaluate the vascular anatomy. CONTRAST:  100 mL of Isovue 370 IV contrast COMPARISON:  Chest radiograph performed earlier today at 12:37 a.m. FINDINGS: Cardiovascular: There is no evidence of pulmonary embolus. The heart is normal in size. Minimal calcification and mural thrombus is noted along the aortic arch. The great vessels are grossly unremarkable in appearance. Mediastinum/Nodes: Visualized mediastinal nodes remain borderline normal in size. No pericardial effusion is seen. A small subcentimeter hypodensity is noted at the left thyroid lobe. This is likely benign, given its size. No axillary lymphadenopathy is seen. Lungs/Pleura: The lungs are clear bilaterally. No focal consolidation, pleural effusion or  pneumothorax is seen. No masses are identified. Upper Abdomen: The visualized portions of the liver and spleen are unremarkable. The patient is status cholecystectomy, with clips noted at the gallbladder fossa. The visualized portions of the pancreas, adrenal glands and kidneys are within normal limits. Musculoskeletal: No acute osseous abnormalities are identified. The visualized musculature is unremarkable in appearance. Review of the MIP images confirms the above findings. IMPRESSION: 1. No evidence of pulmonary embolus. 2.  Lungs clear bilaterally. Electronically Signed   By: Garald Balding M.D.   On: 05/26/2016 03:22    Procedures Procedures (including critical care time)  Medications Ordered in ED Medications - No data to display   Initial Impression / Assessment and Plan / ED Course  I have reviewed the triage vital signs and the nursing notes.  Pertinent labs & imaging results that were available during my care of the patient were reviewed by me and considered in my medical decision making (see chart for details).     Patient presents with left-sided back pain and chest pain from coughing. Productive of yellow mucus. No shortness of breath.  Not wheezing on exam. No distress. Lungs are clear. EKG nonischemic.  Chest x-rays negative. Patient with apparent muscular skeletal back and chest pain from coughing. She continues to smoke. Not wheezing on exam. D-dimer slightly elevated and subsequent CT PE performed which is negative for all my embolism and negative for pneumonia.  Discussed with patient supportive care for likely viral bronchitis. She Recently completed a course of Levaquin. Do not feel further antibiotics indicated. She needs to stop smoking. Will be given cough suppressant and bronchodilators. Follow-up with PCP. Return precautions discussed.   Final Clinical Impressions(s) / ED Diagnoses   Final diagnoses:  Bronchitis    New Prescriptions New Prescriptions   No  medications on file  I personally performed the services described in this documentation, which was scribed in my presence. The recorded information has been reviewed and is accurate.    Ezequiel Essex, MD 05/26/16 [phone]

## 2016-05-26 ENCOUNTER — Emergency Department (HOSPITAL_COMMUNITY): Payer: BLUE CROSS/BLUE SHIELD

## 2016-05-26 LAB — CBC WITH DIFFERENTIAL/PLATELET
BASOS ABS: 0 10*3/uL (ref 0.0–0.1)
BASOS PCT: 0 %
Eosinophils Absolute: 0.1 10*3/uL (ref 0.0–0.7)
Eosinophils Relative: 2 %
HEMATOCRIT: 40.9 % (ref 36.0–46.0)
Hemoglobin: 13.7 g/dL (ref 12.0–15.0)
LYMPHS PCT: 26 %
Lymphs Abs: 2.1 10*3/uL (ref 0.7–4.0)
MCH: 32.6 pg (ref 26.0–34.0)
MCHC: 33.5 g/dL (ref 30.0–36.0)
MCV: 97.4 fL (ref 78.0–100.0)
MONO ABS: 0.7 10*3/uL (ref 0.1–1.0)
Monocytes Relative: 9 %
NEUTROS ABS: 5.1 10*3/uL (ref 1.7–7.7)
Neutrophils Relative %: 63 %
PLATELETS: 239 10*3/uL (ref 150–400)
RBC: 4.2 MIL/uL (ref 3.87–5.11)
RDW: 13.8 % (ref 11.5–15.5)
WBC: 8.1 10*3/uL (ref 4.0–10.5)

## 2016-05-26 LAB — URINALYSIS, ROUTINE W REFLEX MICROSCOPIC
BILIRUBIN URINE: NEGATIVE
GLUCOSE, UA: NEGATIVE mg/dL
HGB URINE DIPSTICK: NEGATIVE
KETONES UR: NEGATIVE mg/dL
LEUKOCYTES UA: NEGATIVE
NITRITE: NEGATIVE
PH: 7 (ref 5.0–8.0)
Protein, ur: NEGATIVE mg/dL
Specific Gravity, Urine: 1.004 — ABNORMAL LOW (ref 1.005–1.030)

## 2016-05-26 LAB — BASIC METABOLIC PANEL
ANION GAP: 6 (ref 5–15)
BUN: 9 mg/dL (ref 6–20)
CHLORIDE: 101 mmol/L (ref 101–111)
CO2: 29 mmol/L (ref 22–32)
Calcium: 8.9 mg/dL (ref 8.9–10.3)
Creatinine, Ser: 0.88 mg/dL (ref 0.44–1.00)
Glucose, Bld: 109 mg/dL — ABNORMAL HIGH (ref 65–99)
POTASSIUM: 3.9 mmol/L (ref 3.5–5.1)
Sodium: 136 mmol/L (ref 135–145)

## 2016-05-26 LAB — TROPONIN I

## 2016-05-26 LAB — D-DIMER, QUANTITATIVE: D-Dimer, Quant: 0.68 ug/mL-FEU — ABNORMAL HIGH (ref 0.00–0.50)

## 2016-05-26 MED ORDER — BENZONATATE 100 MG PO CAPS: 100.0000 mg | ORAL_CAPSULE | Freq: Three times a day (TID) | ORAL | 0 refills | Status: DC

## 2016-05-26 MED ORDER — IOPAMIDOL (ISOVUE-370) INJECTION 76%
100.0000 mL | Freq: Once | INTRAVENOUS | Status: AC | PRN
  Administered 2016-05-26: 100 mL via INTRAVENOUS

## 2016-05-26 MED ORDER — ALBUTEROL SULFATE HFA 108 (90 BASE) MCG/ACT IN AERS: 2.0000 | INHALATION_SPRAY | Freq: Four times a day (QID) | RESPIRATORY_TRACT | 0 refills | Status: DC | PRN

## 2016-05-26 MED ORDER — ALBUTEROL SULFATE HFA 108 (90 BASE) MCG/ACT IN AERS
2.0000 | INHALATION_SPRAY | Freq: Once | RESPIRATORY_TRACT | Status: AC
  Administered 2016-05-26: 2 via RESPIRATORY_TRACT
  Filled 2016-05-26: qty 6.7

## 2016-05-26 NOTE — Discharge Instructions (Signed)
There is no evidence of heart attack or blood clot in the lung. Followup with your doctor. Stop smoking. Return to the ED if you develop new or worsening symptoms.

## 2016-07-23 ENCOUNTER — Encounter: Payer: Self-pay | Admitting: Family Medicine

## 2016-07-23 ENCOUNTER — Ambulatory Visit (INDEPENDENT_AMBULATORY_CARE_PROVIDER_SITE_OTHER): Payer: BLUE CROSS/BLUE SHIELD | Admitting: Family Medicine

## 2016-07-23 VITALS — BP 136/88 | HR 63 | Temp 97.6°F | Ht 66.0 in | Wt 232.0 lb

## 2016-07-23 DIAGNOSIS — L732 Hidradenitis suppurativa: Secondary | ICD-10-CM

## 2016-07-23 MED ORDER — SULFAMETHOXAZOLE-TRIMETHOPRIM 800-160 MG PO TABS: 1.0000 | ORAL_TABLET | Freq: Two times a day (BID) | ORAL | 0 refills | Status: DC

## 2016-07-23 NOTE — Progress Notes (Signed)
BP 136/88   Pulse 63   Temp 97.6 F (36.4 C) (Oral)   Ht 5\' 6"  (1.676 m)   Wt 232 lb (105.2 kg)   BMI 37.45 kg/m    Subjective:    Patient ID: Courtney Mcconnell, female    DOB: [birth year], 61 y.o.   MRN: [id number]  HPI: Courtney Mcconnell is a 61 y.o. female presenting on 07/23/2016 for Lesions on right underarm   HPI Lesions and pain on the right axilla Patient comes in today for painful and sore lesions under right axilla, she had 2 that are drained and then one that's starting up and swelling that is very painful. She has been using topical antibiotic ointment on it without much success. She is also been doing warm compresses try and get it to drain. She denies any fevers or chills. She denies any body aches. She says the pain associated with it as 7 out of 10 especially when pressed upon. She said one of the lesions that are drained drained yellow-green purulent material.  Relevant past medical, surgical, family and social history reviewed and updated as indicated. Interim medical history since our last visit reviewed. Allergies and medications reviewed and updated.  Review of Systems  Constitutional: Negative for chills and fever.  Respiratory: Negative for chest tightness and shortness of breath.   Cardiovascular: Negative for chest pain and leg swelling.  Musculoskeletal: Negative for back pain and gait problem.  Skin: Positive for color change. Negative for rash.  Neurological: Negative for light-headedness and headaches.  Psychiatric/Behavioral: Negative for agitation and behavioral problems.  All other systems reviewed and are negative.   Per HPI unless specifically indicated above        Objective:    BP 136/88   Pulse 63   Temp 97.6 F (36.4 C) (Oral)   Ht 5\' 6"  (1.676 m)   Wt 232 lb (105.2 kg)   BMI 37.45 kg/m   Wt Readings from Last 3 Encounters:  07/23/16 232 lb (105.2 kg)  05/25/16 234 lb (106.1 kg)  04/20/16 234 lb 9.6 oz (106.4 kg)    Physical Exam   Constitutional: She is oriented to person, place, and time. She appears well-developed and well-nourished. No distress.  Eyes: Conjunctivae are normal.  Musculoskeletal: Normal range of motion.  Neurological: She is alert and oriented to person, place, and time. Coordination normal.  Skin: Skin is warm and dry. Lesion noted. No rash noted. She is not diaphoretic. There is erythema (Erythema and swelling under right axilla and 3 spots, 2 appeared to be nontender and have drained, one appears to be building up and is about 1.5 cm x 1 cm in size, no fluctuance,, half centimeter size of induration).  Psychiatric: She has a normal mood and affect. Her behavior is normal.  Nursing note and vitals reviewed.       Assessment & Plan:   Problem List Items Addressed This Visit    None    Visit Diagnoses    Hidradenitis suppurativa    -  Primary   3 spots that are drained under right axilla, one that's starting to form, will send Bactrim   Relevant Medications   sulfamethoxazole-trimethoprim (BACTRIM DS,SEPTRA DS) 800-160 MG tablet       Follow up plan: Return if symptoms worsen or fail to improve.  Counseling provided for all of the vaccine components No orders of the defined types were placed in this encounter.   Caryl Pina, MD [facility] 07/23/2016, 5:17 PM

## 2016-07-27 ENCOUNTER — Ambulatory Visit: Payer: BLUE CROSS/BLUE SHIELD | Admitting: Family Medicine

## 2016-08-29 ENCOUNTER — Encounter: Payer: Self-pay | Admitting: Family Medicine

## 2016-08-29 ENCOUNTER — Ambulatory Visit (INDEPENDENT_AMBULATORY_CARE_PROVIDER_SITE_OTHER): Payer: BLUE CROSS/BLUE SHIELD | Admitting: Family Medicine

## 2016-08-29 VITALS — BP 132/85 | HR 65 | Temp 98.1°F | Ht 66.0 in | Wt 234.0 lb

## 2016-08-29 DIAGNOSIS — J441 Chronic obstructive pulmonary disease with (acute) exacerbation: Secondary | ICD-10-CM

## 2016-08-29 DIAGNOSIS — F17200 Nicotine dependence, unspecified, uncomplicated: Secondary | ICD-10-CM

## 2016-08-29 MED ORDER — PREDNISONE 20 MG PO TABS: ORAL_TABLET | ORAL | 0 refills | Status: DC

## 2016-08-29 MED ORDER — BUPROPION HCL ER (SR) 150 MG PO TB12: 150.0000 mg | ORAL_TABLET | Freq: Two times a day (BID) | ORAL | 2 refills | Status: DC

## 2016-08-29 MED ORDER — BUDESONIDE-FORMOTEROL FUMARATE 160-4.5 MCG/ACT IN AERO: 2.0000 | INHALATION_SPRAY | Freq: Two times a day (BID) | RESPIRATORY_TRACT | 0 refills | Status: DC

## 2016-08-29 MED ORDER — CEFDINIR 300 MG PO CAPS: 300.0000 mg | ORAL_CAPSULE | Freq: Two times a day (BID) | ORAL | 0 refills | Status: DC

## 2016-08-29 NOTE — Progress Notes (Signed)
BP 132/85   Pulse 65   Temp 98.1 F (36.7 C) (Oral)   Ht 5\' 6"  (1.676 m)   Wt 234 lb (106.1 kg)   BMI 37.77 kg/m    Subjective:    Patient ID: Courtney Mcconnell, female    DOB: Jan 03, 1956, 61 y.o.   MRN: [id number]  HPI: Courtney Mcconnell is a 61 y.o. female presenting on 08/29/2016 for Bronchitis (chest congestion, wheezing, cough, hurting in rib area) and Bilateral ear pain   HPI Cough and chest congestion and wheezing Patient has been having cough and chest congestion that's been going on for the past 4 or 5 days. She feels like she is having more wheezing and because of the coughing she starting to have rib pain on the right back thorax. Her cough is mostly nonproductive. She does admit that she has started smoking again and is probably a contributing factor. She denies any fevers or chills. She does have bilateral ear pressure and pain but no drainage as well. She has some sinus pressure and postnasal drainage as well. Patient says she is ready to try and quit smoking again and would like something to help her. She says a lot of her issues with quitting smoking was having anxiety about it  Relevant past medical, surgical, family and social history reviewed and updated as indicated. Interim medical history since our last visit reviewed. Allergies and medications reviewed and updated.  Review of Systems  Constitutional: Negative for chills and fever.  HENT: Positive for congestion, postnasal drip, rhinorrhea, sinus pressure, sneezing and sore throat. Negative for ear discharge and ear pain.   Eyes: Negative for pain, redness and visual disturbance.  Respiratory: Positive for cough and wheezing. Negative for chest tightness and shortness of breath.   Cardiovascular: Negative for chest pain and leg swelling.  Genitourinary: Negative for difficulty urinating and dysuria.  Musculoskeletal: Negative for back pain and gait problem.  Skin: Negative for rash.  Neurological: Negative for  light-headedness and headaches.  Psychiatric/Behavioral: Negative for agitation and behavioral problems.  All other systems reviewed and are negative.   Per HPI unless specifically indicated above   Allergies as of 08/29/2016      Reactions   Bee Venom Swelling   Amoxicillin-pot Clavulanate Rash   Doxycycline Hyclate Rash   Rash and itching      Medication List       Accurate as of 08/29/16  5:26 PM. Always use your most recent med list.          albuterol 108 (90 Base) MCG/ACT inhaler Commonly known as:  PROVENTIL HFA;VENTOLIN HFA Inhale 2 puffs into the lungs every 6 (six) hours as needed for wheezing or shortness of breath.   budesonide-formoterol 160-4.5 MCG/ACT inhaler Commonly known as:  SYMBICORT Inhale 2 puffs into the lungs 2 (two) times daily.   buPROPion 150 MG 12 hr tablet Commonly known as:  WELLBUTRIN SR Take 1 tablet (150 mg total) by mouth 2 (two) times daily.   cefdinir 300 MG capsule Commonly known as:  OMNICEF Take 1 capsule (300 mg total) by mouth 2 (two) times daily. 1 po BID   cetirizine 10 MG tablet Commonly known as:  ZYRTEC Take 1 tablet (10 mg total) by mouth daily.   fluticasone 50 MCG/ACT nasal spray Commonly known as:  FLONASE PLACE 2 SPRAYS INTO BOTH NOSTRILS DAILY.   lacosamide 200 MG Tabs tablet Commonly known as:  VIMPAT Take 1 tablet (200 mg total) by mouth 2 (  two) times daily.   nitroGLYCERIN 0.4 MG SL tablet Commonly known as:  NITROSTAT Place 1 tablet (0.4 mg total) under the tongue every 5 (five) minutes as needed for chest pain.   omeprazole 20 MG capsule Commonly known as:  PRILOSEC Take 20 mg by mouth daily.   predniSONE 20 MG tablet Commonly known as:  DELTASONE 2 po at same time daily for 5 days   Spacer/Aero Chamber Mouthpiece Misc 1 each by Does not apply route every 6 (six) hours as needed.   tiotropium 18 MCG inhalation capsule Commonly known as:  SPIRIVA HANDIHALER Place 1 capsule (18 mcg total) into  inhaler and inhale daily.          Objective:    BP 132/85   Pulse 65   Temp 98.1 F (36.7 C) (Oral)   Ht 5\' 6"  (1.676 m)   Wt 234 lb (106.1 kg)   BMI 37.77 kg/m   Wt Readings from Last 3 Encounters:  08/29/16 234 lb (106.1 kg)  07/23/16 232 lb (105.2 kg)  05/25/16 234 lb (106.1 kg)    Physical Exam  Constitutional: She is oriented to person, place, and time. She appears well-developed and well-nourished. No distress.  HENT:  Right Ear: Tympanic membrane, external ear and ear canal normal.  Left Ear: Tympanic membrane, external ear and ear canal normal.  Nose: Mucosal edema and rhinorrhea present. No epistaxis. Right sinus exhibits no maxillary sinus tenderness and no frontal sinus tenderness. Left sinus exhibits no maxillary sinus tenderness and no frontal sinus tenderness.  Mouth/Throat: Uvula is midline and mucous membranes are normal. Posterior oropharyngeal edema and posterior oropharyngeal erythema present. No oropharyngeal exudate or tonsillar abscesses.  Eyes: Conjunctivae and EOM are normal.  Cardiovascular: Normal rate, regular rhythm, normal heart sounds and intact distal pulses.   No murmur heard. Pulmonary/Chest: Effort normal. No tachypnea. No respiratory distress. She has no decreased breath sounds. She has wheezes in the right upper field, the right middle field, the left upper field and the left middle field. She has rhonchi in the right upper field and the left upper field. She has no rales.  Musculoskeletal: Normal range of motion. She exhibits no edema or tenderness.  Neurological: She is alert and oriented to person, place, and time. Coordination normal.  Skin: Skin is warm and dry. No rash noted. She is not diaphoretic.  Psychiatric: She has a normal mood and affect. Her behavior is normal.  Vitals reviewed.       Assessment & Plan:   Problem List Items Addressed This Visit      Other   Smoker   Relevant Medications   buPROPion (WELLBUTRIN SR) 150  MG 12 hr tablet    Other Visit Diagnoses    COPD exacerbation ([place])    -  Primary   Relevant Medications   predniSONE (DELTASONE) 20 MG tablet   cefdinir (OMNICEF) 300 MG capsule   budesonide-formoterol (SYMBICORT) 160-4.5 MCG/ACT inhaler       Follow up plan: Return in about 8 weeks (around 10/24/2016), or if symptoms worsen or fail to improve, for Recheck breathing and smoking cessation.  Counseling provided for all of the vaccine components No orders of the defined types were placed in this encounter.   Caryl Pina, MD [facility] 08/29/2016, 5:26 PM

## 2016-09-11 ENCOUNTER — Other Ambulatory Visit: Payer: Self-pay | Admitting: Family Medicine

## 2016-09-11 DIAGNOSIS — J439 Emphysema, unspecified: Secondary | ICD-10-CM

## 2016-10-19 ENCOUNTER — Encounter: Payer: Self-pay | Admitting: Family Medicine

## 2016-10-19 ENCOUNTER — Ambulatory Visit (INDEPENDENT_AMBULATORY_CARE_PROVIDER_SITE_OTHER): Payer: BLUE CROSS/BLUE SHIELD | Admitting: Family Medicine

## 2016-10-19 VITALS — BP 136/87 | HR 67 | Temp 98.3°F | Ht 66.0 in | Wt 238.0 lb

## 2016-10-19 DIAGNOSIS — M546 Pain in thoracic spine: Secondary | ICD-10-CM | POA: Diagnosis not present

## 2016-10-19 MED ORDER — BACLOFEN 10 MG PO TABS: 10.0000 mg | ORAL_TABLET | Freq: Three times a day (TID) | ORAL | 1 refills | Status: DC

## 2016-10-19 NOTE — Progress Notes (Signed)
BP 136/87   Pulse 67   Temp 98.3 F (36.8 C) (Oral)   Ht 5\' 6"  (1.676 m)   Wt 238 lb (108 kg)   BMI 38.41 kg/m    Subjective:    Patient ID: Courtney Mcconnell, female    DOB: February 03, 1956, 61 y.o.   MRN: [id number]  HPI: Courtney Mcconnell is a 61 y.o. female presenting on 10/19/2016 for Back Pain (lumbar region radiating up; symptoms began 2 days ago, no known injury)   HPI Mid back pain on the right Patient has been having mid back pain on the right and the left been going on for the past 2 days. She cannot recall doing anything specific to it or injuring it. She says that the pain does sometimes shoot up her back further but she denies any pain going down or any numbness or weakness or any pain in the middle. She denies any fevers or chills or redness or warmth. Pain is worse with movement and prolonged standing.  Relevant past medical, surgical, family and social history reviewed and updated as indicated. Interim medical history since our last visit reviewed. Allergies and medications reviewed and updated.  Review of Systems  Constitutional: Negative for chills and fever.  Eyes: Negative for visual disturbance.  Respiratory: Negative for chest tightness and shortness of breath.   Cardiovascular: Negative for chest pain and leg swelling.  Musculoskeletal: Positive for back pain and myalgias. Negative for gait problem.  Skin: Negative for color change and rash.  Neurological: Negative for light-headedness and headaches.  Psychiatric/Behavioral: Negative for agitation and behavioral problems.  All other systems reviewed and are negative.   Per HPI unless specifically indicated above        Objective:    BP 136/87   Pulse 67   Temp 98.3 F (36.8 C) (Oral)   Ht 5\' 6"  (1.676 m)   Wt 238 lb (108 kg)   BMI 38.41 kg/m   Wt Readings from Last 3 Encounters:  10/19/16 238 lb (108 kg)  08/29/16 234 lb (106.1 kg)  07/23/16 232 lb (105.2 kg)    Physical Exam  Constitutional: She  is oriented to person, place, and time. She appears well-developed and well-nourished. No distress.  Eyes: Conjunctivae are normal.  Neck: Neck supple. No thyromegaly present.  Cardiovascular: Normal rate, regular rhythm, normal heart sounds and intact distal pulses.   No murmur heard. Pulmonary/Chest: Effort normal and breath sounds normal. No respiratory distress. She has no wheezes. She has no rales.  Musculoskeletal: Normal range of motion. She exhibits tenderness (Right mid back pain, no midline pain and negative straight leg raise). She exhibits no edema.  Lymphadenopathy:    She has no cervical adenopathy.  Neurological: She is alert and oriented to person, place, and time. Coordination normal.  Skin: Skin is warm and dry. No rash noted. She is not diaphoretic.  Psychiatric: She has a normal mood and affect. Her behavior is normal.  Nursing note and vitals reviewed.       Assessment & Plan:   Problem List Items Addressed This Visit    None    Visit Diagnoses    Acute right-sided thoracic back pain    -  Primary   Likely muscular strain or spasm, will send a muscle relaxer and recommended heat and massage and stretching   Relevant Medications   baclofen (LIORESAL) 10 MG tablet       Follow up plan: Return if symptoms worsen or fail to  improve.  Counseling provided for all of the vaccine components No orders of the defined types were placed in this encounter.   Caryl Pina, MD [facility] 10/19/2016, 10:22 AM

## 2016-11-08 ENCOUNTER — Telehealth: Payer: Self-pay | Admitting: Family Medicine

## 2016-11-09 MED ORDER — ALBUTEROL SULFATE HFA 108 (90 BASE) MCG/ACT IN AERS: 2.0000 | INHALATION_SPRAY | Freq: Four times a day (QID) | RESPIRATORY_TRACT | 2 refills | Status: DC | PRN

## 2016-11-09 NOTE — Telephone Encounter (Signed)
Pt aware refill sent to pharmacy 

## 2016-11-11 IMAGING — MR MR ANKLE*R* WO/W CM
3 of 13 series · 8 of 40 positions shown · IV contrast (multihance)
Comparison: Recent ultrasound and radiographs.

CLINICAL DATA: Right lower leg pain at site of wound from a
lawnmower injury 4 months ago.

EXAM:
MRI OF THE RIGHT lower extremity WITHOUT AND WITH CONTRAST
TECHNIQUE: Multiplanar, multisequence MR imaging of the right lower extremity
was performed both before and after administration of intravenous
contrast.
CONTRAST:  20 cc MultiHance

[Series 4: T2 · coronal · 4.0mm · 0.52mm/px · 3 of 30 slices shown]
[im 1/30]
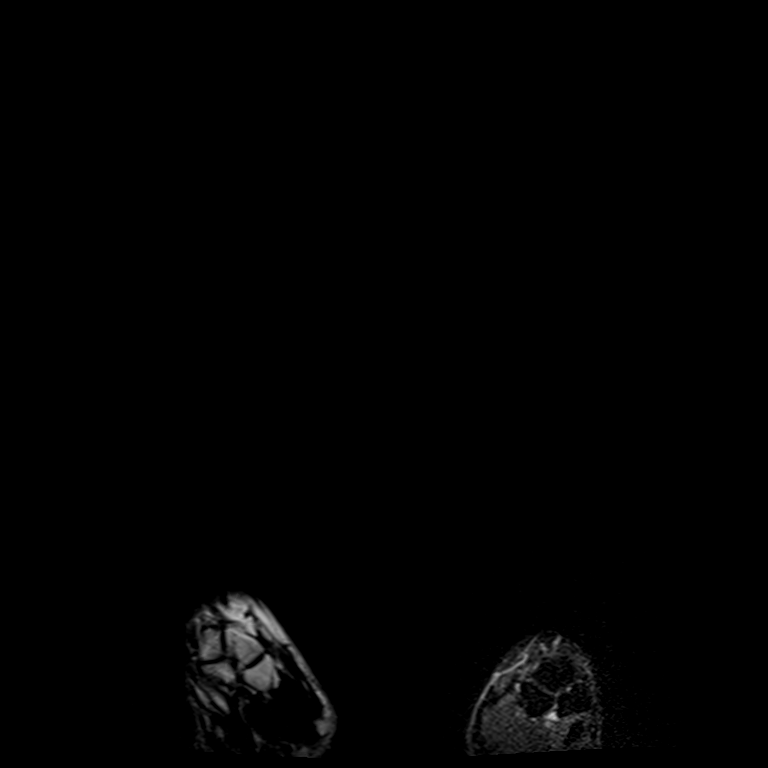
[im 15/30]
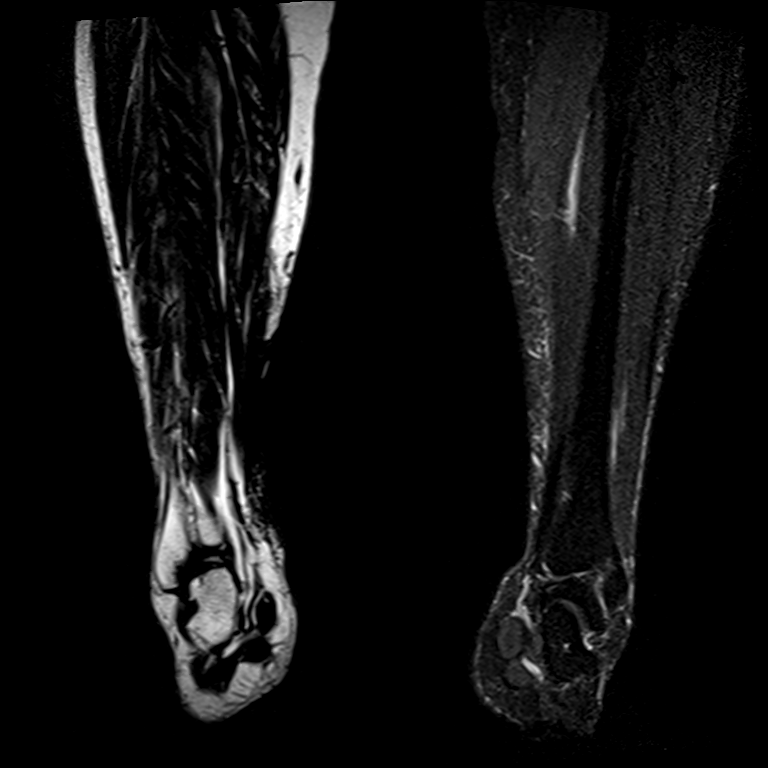
[im 30/30]
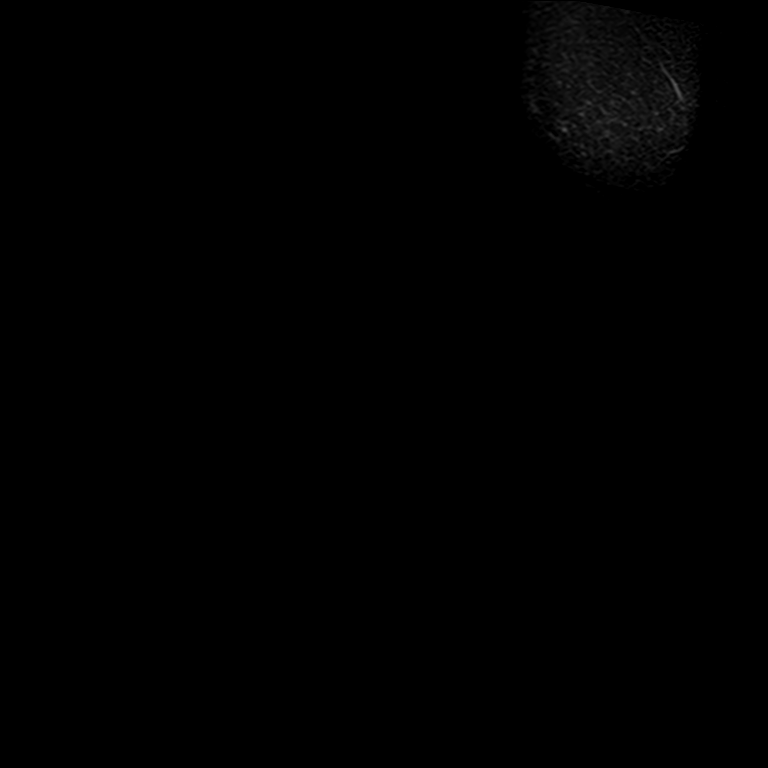

[Series 9: T1 · coronal · 4.0mm · 0.55mm/px · 3 of 30 slices shown (1 of 2)]
[im 1/30]
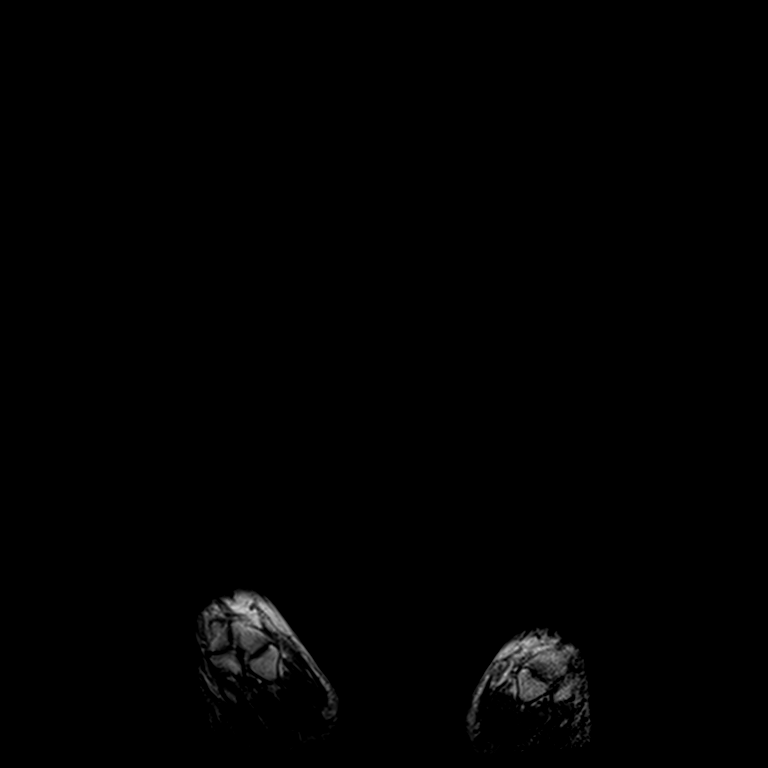
[im 15/30]
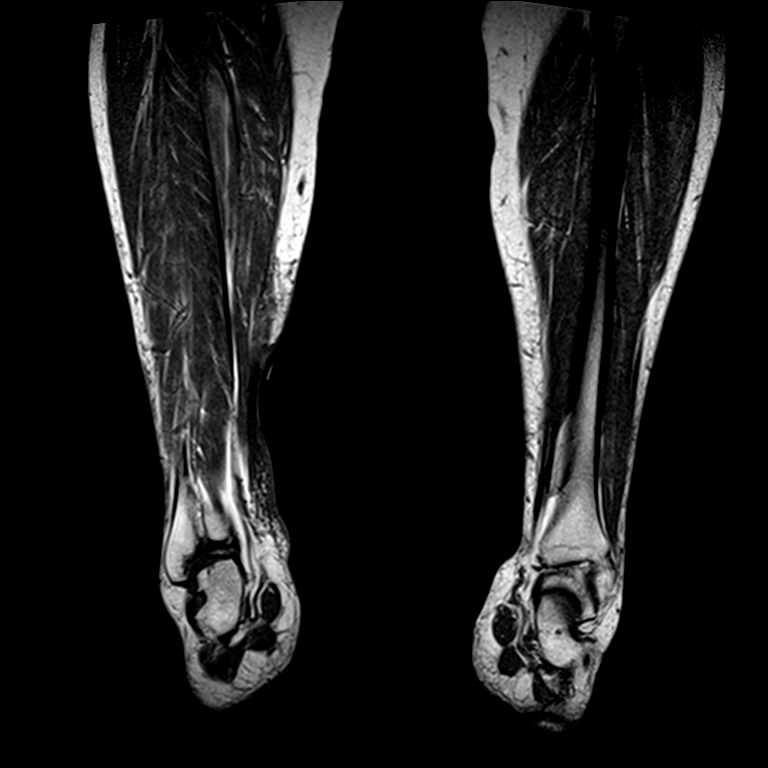
[im 30/30]
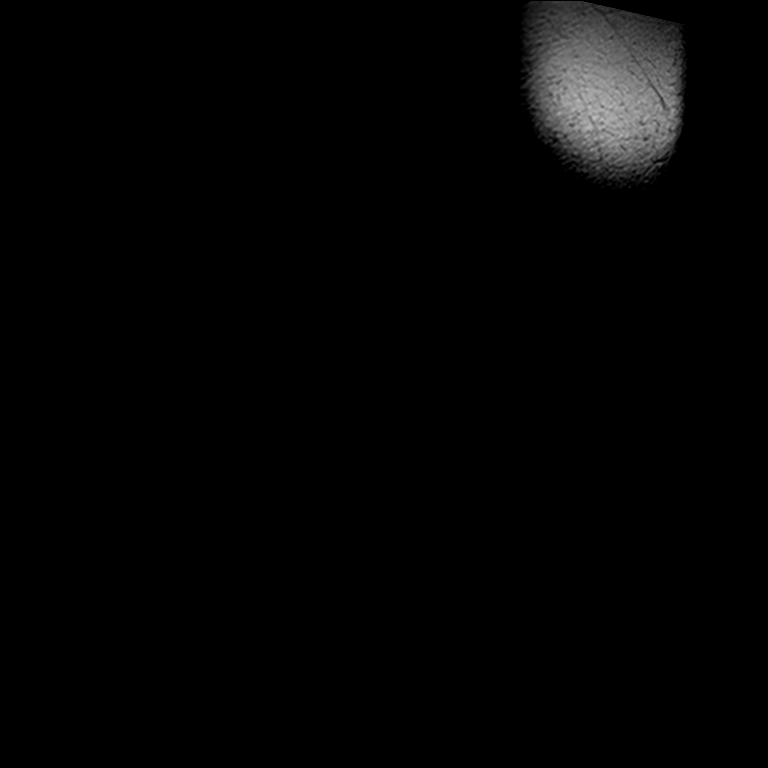

[Series 10: T1 · sagittal · 4.0mm · 0.41mm/px · 2 of 30 slices shown (2 of 2)]
[im 1/30]
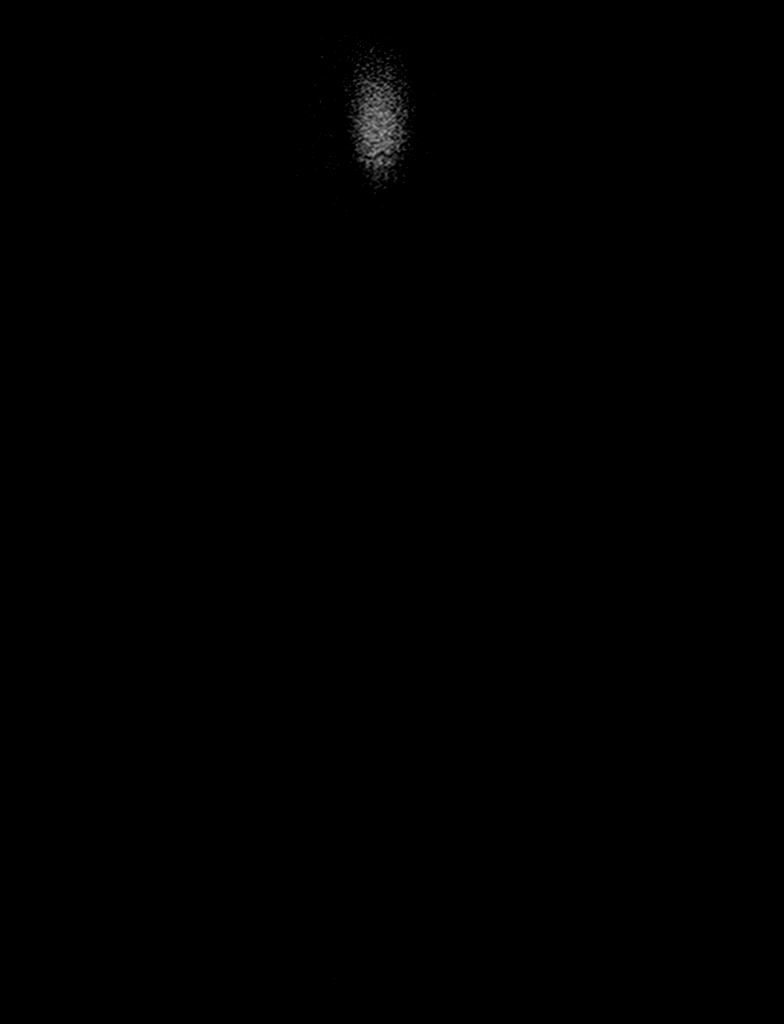
[im 30/30]
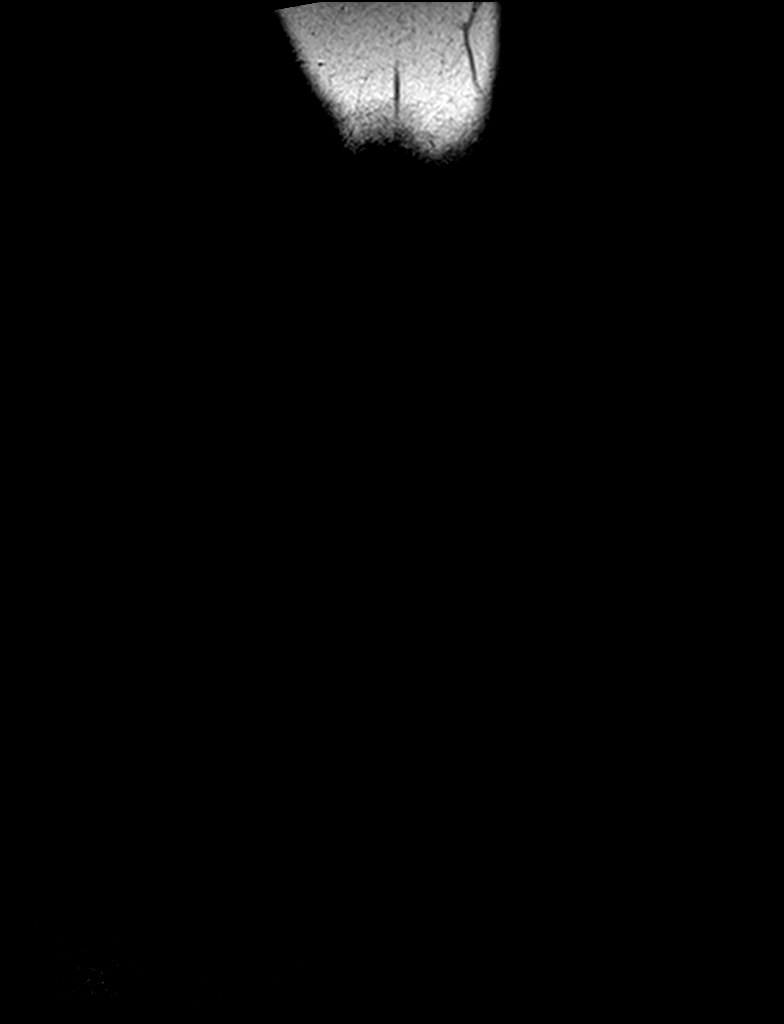

[8 of 40 positions shown; findings below may reference images not displayed]

FINDINGS: Examination is quite limited due to significant artifact. I do not
however see any definite findings to suggest osteomyelitis or septic
arthritis. There is a wound along the medial aspect of the lower
tibia/fibula and there is a focal complex subcutaneous enhancing
fluid collection which is most likely enhancing granulation tissue
surrounding a resolving/ contracting hematoma. This correlates with
the ultrasound examination. I do not see a discrete fluid collection
to suggest a drainable abscess.
IMPRESSION: Very limited examination but no findings to suggest osteomyelitis or
septic arthritis.

Thick rim enhancing subcutaneous fluid collection is probably a
resolving hematoma with surrounding granulation tissue. No MR
findings for a drainable soft tissue abscess.

## 2016-11-15 ENCOUNTER — Other Ambulatory Visit: Payer: Self-pay | Admitting: Family Medicine

## 2016-11-15 DIAGNOSIS — J439 Emphysema, unspecified: Secondary | ICD-10-CM

## 2016-11-19 ENCOUNTER — Other Ambulatory Visit: Payer: Self-pay | Admitting: Pediatrics

## 2016-11-19 DIAGNOSIS — J440 Chronic obstructive pulmonary disease with acute lower respiratory infection: Secondary | ICD-10-CM

## 2016-11-19 DIAGNOSIS — J209 Acute bronchitis, unspecified: Secondary | ICD-10-CM

## 2016-11-21 ENCOUNTER — Encounter (HOSPITAL_COMMUNITY): Payer: Self-pay | Admitting: *Deleted

## 2016-11-21 ENCOUNTER — Emergency Department (HOSPITAL_COMMUNITY)
Admission: EM | Admit: 2016-11-21 | Discharge: 2016-11-21 | Disposition: A | Discharge status: Home / Self Care | Payer: BLUE CROSS/BLUE SHIELD | Attending: Emergency Medicine | Admitting: Emergency Medicine

## 2016-11-21 DIAGNOSIS — J449 Chronic obstructive pulmonary disease, unspecified: Secondary | ICD-10-CM | POA: Insufficient documentation

## 2016-11-21 DIAGNOSIS — I10 Essential (primary) hypertension: Secondary | ICD-10-CM | POA: Diagnosis not present

## 2016-11-21 DIAGNOSIS — M79632 Pain in left forearm: Secondary | ICD-10-CM | POA: Diagnosis not present

## 2016-11-21 DIAGNOSIS — Z79899 Other long term (current) drug therapy: Secondary | ICD-10-CM | POA: Insufficient documentation

## 2016-11-21 DIAGNOSIS — J45909 Unspecified asthma, uncomplicated: Secondary | ICD-10-CM | POA: Diagnosis not present

## 2016-11-21 DIAGNOSIS — F17210 Nicotine dependence, cigarettes, uncomplicated: Secondary | ICD-10-CM | POA: Insufficient documentation

## 2016-11-21 DIAGNOSIS — M7989 Other specified soft tissue disorders: Secondary | ICD-10-CM | POA: Diagnosis not present

## 2016-11-21 MED ORDER — IBUPROFEN 800 MG PO TABS
800.0000 mg | ORAL_TABLET | Freq: Once | ORAL | Status: AC
  Administered 2016-11-21: 800 mg via ORAL
  Filled 2016-11-21: qty 1

## 2016-11-21 NOTE — ED Provider Notes (Signed)
[facility] DEPT Provider Note   CSN: [id number] Arrival date & time: 11/21/16  0010     History   Chief Complaint No chief complaint on file.   HPI Courtney Mcconnell is a 60 y.o. female.  The history is provided by the patient.  She noted about 2 hours ago that she was bitten by something on her left medial thigh, and she then developed some pain and swelling in the left distal forearm. It hurts to use her fingers. She rates pain at 8/10. She does not recall any trauma to her arm. She is not having any pain in her thigh where she felt the initial bite.  Past Medical History:  Diagnosis Date  . Asthma   . Bursitis of left hip    right  . COPD (chronic obstructive pulmonary disease) ([place])   . GERD (gastroesophageal reflux disease)   . Headache(784.0)   . Hypertension   . Pneumonia   . Seizure ([place])    most recent 12/12/14  . Sleep apnea     Patient Active Problem List   Diagnosis Date Noted  . Pulmonary nodules 12/08/2015  . 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 Sleep apnea 04/06/2015  . Pulmonary emphysema ([place]) 03/02/2015  . Smoker 02/16/2015  . Seizure disorder ([place]) 01/21/2015  . Focal epilepsy with impairment of consciousness ([place]) 12/13/2014  . Seizures ([place]) 04/18/2014  . HTN (hypertension) 04/18/2014  . 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 Tobacco use disorder 07/19/2012    Past Surgical History:  Procedure Laterality Date  . ABDOMINAL HYSTERECTOMY    . APPENDECTOMY    . CATARACT EXTRACTION Bilateral   . CHOLECYSTECTOMY    . heal spur     right  . LASER PHOTO ABLATION Right 08/27/2013   Procedure: LASER PHOTO ABLATION;  Surgeon: Hayden Pedro, MD;  Location: [facility];  Service: Ophthalmology;  Laterality: Right;  Headscope laser AND Endolaser  . MEMBRANE PEEL Right 08/27/2013   Procedure: MEMBRANE PEEL;  Surgeon: Hayden Pedro, MD;  Location: [facility];  Service: Ophthalmology;  Laterality: Right;  . PARS PLANA VITRECTOMY Right 08/27/2013     Procedure: PARS PLANA VITRECTOMY WITH 25 GAUGE;  Surgeon: Hayden Pedro, MD;  Location: [facility];  Service: Ophthalmology;  Laterality: Right;    OB History    Gravida Para Term Preterm AB Living   2 2 2     3    SAB TAB Ectopic Multiple Live Births                   Home Medications    Prior to Admission medications   Medication Sig Start Date End Date Taking? Authorizing Provider  albuterol (PROVENTIL HFA;VENTOLIN HFA) 108 (90 Base) MCG/ACT inhaler Inhale 2 puffs into the lungs every 6 (six) hours as needed for wheezing or shortness of breath. 11/09/16   Dettinger, Fransisca Kaufmann, MD  baclofen (LIORESAL) 10 MG tablet Take 1 tablet (10 mg total) by mouth 3 (three) times daily. 10/19/16   Dettinger, Fransisca Kaufmann, MD  budesonide-formoterol (SYMBICORT) 160-4.5 MCG/ACT inhaler Inhale 2 puffs into the lungs 2 (two) times daily. 08/29/16   Dettinger, Fransisca Kaufmann, MD  buPROPion (WELLBUTRIN SR) 150 MG 12 hr tablet Take 1 tablet (150 mg total) by mouth 2 (two) times daily. 08/29/16   Dettinger, Fransisca Kaufmann, MD  cetirizine (ZYRTEC) 10 MG tablet Take 1 Tablet by mouth once daily 09/12/16   Dettinger, Fransisca Kaufmann, MD  cetirizine (ZYRTEC) 10 MG tablet TAKE  1 TABLET (10 MG TOTAL) BY MOUTH DAILY. 11/15/16   Dettinger, Fransisca Kaufmann, MD  fluticasone (FLONASE) 50 MCG/ACT nasal spray PLACE 2 SPRAYS INTO BOTH NOSTRILS DAILY. 12/30/15   Dettinger, Fransisca Kaufmann, MD  lacosamide (VIMPAT) 200 MG TABS tablet Take 1 tablet (200 mg total) by mouth 2 (two) times daily. 01/21/15   Penumalli, Earlean Polka, MD  nitroGLYCERIN (NITROSTAT) 0.4 MG SL tablet Place 1 tablet (0.4 mg total) under the tongue every 5 (five) minutes as needed for chest pain. 09/01/15   Nat Christen, MD  omeprazole (PRILOSEC) 20 MG capsule Take 20 mg by mouth daily.    [provider]  Spacer/Aero Chamber Mouthpiece MISC 1 each by Does not apply route every 6 (six) hours as needed. 04/20/16   Eustaquio Maize, MD  SPIRIVA HANDIHALER 18 MCG inhalation capsule Place 1 capsule in  Handihaler and inhale once daily 11/20/16   Eustaquio Maize, MD    Family History Family History  Problem Relation Age of Onset  . Diabetes Father   . Hypertension Father   . Hyperlipidemia Father   . COPD Mother   . Diabetes Brother   . Stroke Paternal Grandmother   . Cancer Sister   . Cancer Paternal Uncle   . Colon cancer Neg Hx     Social History Social History  Substance Use Topics  . Smoking status: Current Every Day Smoker    Packs/day: 1.00    Years: 42.00    Types: Cigarettes    Last attempt to quit: 03/20/2014  . Smokeless tobacco: Never Used     Comment: 01/21/15 restarted smoking 4 mos ago  . Alcohol use No     Allergies   Bee venom; Amoxicillin-pot clavulanate; and Doxycycline hyclate   Review of Systems Review of Systems  All other systems reviewed and are negative.    Physical Exam Updated Vital Signs BP (!) 176/94 (BP Location: Right Arm)   Pulse (!) 58   Temp 98.2 F (36.8 C) (Oral)   Resp 18   Ht 5\' 6"  (1.676 m)   Wt 107.5 kg (237 lb)   SpO2 99%   BMI 38.25 kg/m   Physical Exam  Nursing note and vitals reviewed.  61 year old female, resting comfortably and in no acute distress. Vital signs are significant for hypertension. Oxygen saturation is 99%, which is normal. Head is normocephalic and atraumatic. PERRLA, EOMI. Oropharynx is clear. Neck is nontender and supple without adenopathy or JVD. Back is nontender and there is no CVA tenderness. Lungs are clear without rales, wheezes, or rhonchi. Chest is nontender. Heart has regular rate and rhythm without murmur. Abdomen is soft, flat, nontender without masses or hepatosplenomegaly and peristalsis is normoactive. Extremities: There is very slight swelling on the distal left forearm R aspect flexor surface. This is somewhat tender to palpation. There is no erythema or wet. There is no crepitus. She does have some pain with passive movement of her fingers. Neurovascular exam is intact with  prompt Early refill, normal sensation, normal strength of intrinsic muscles of the hand. Skin is warm and dry without rash. Neurologic: Mental status is normal, cranial nerves are intact, there are no motor or sensory deficits.  ED Treatments / Results  Labs (all labs ordered are listed, but only abnormal results are displayed) Labs Reviewed - No data to display  EKG  EKG Interpretation None       Radiology No results found.  Procedures Procedures (including critical care time)  Medications  Ordered in ED Medications  ibuprofen (ADVIL,MOTRIN) tablet 800 mg (not administered)     Initial Impression / Assessment and Plan / ED Course  I have reviewed the triage vital signs and the nursing notes.  Pain and swelling of the distal left forearm-etiology unclear. No evidence of actual bite. Patient is advised to use ice and over-the-counter NSAIDs, return precautions discussed. Follow-up with PCP.  Final Clinical Impressions(s) / ED Diagnoses   Final diagnoses:  Pain and swelling of left forearm    New Prescriptions New Prescriptions   No medications on file     Delora Fuel, MD 33/00/76 0110

## 2016-11-21 NOTE — ED Triage Notes (Signed)
Pt c/o pain and limited ROM to left arm and hand; pt states she was bit by something but did not see what it was; pt unable to make a complete fist

## 2016-11-21 NOTE — Discharge Instructions (Signed)
Apply ice several times a day. Take ibuprofen (Advil) as needed for pain. Return if pain or swelling are getting worse, or if you start running a fever.

## 2016-12-20 ENCOUNTER — Other Ambulatory Visit: Payer: Self-pay | Admitting: Family Medicine

## 2016-12-20 DIAGNOSIS — M546 Pain in thoracic spine: Secondary | ICD-10-CM

## 2016-12-22 ENCOUNTER — Other Ambulatory Visit: Payer: Self-pay | Admitting: Pediatrics

## 2016-12-22 DIAGNOSIS — J440 Chronic obstructive pulmonary disease with acute lower respiratory infection: Secondary | ICD-10-CM

## 2016-12-22 DIAGNOSIS — J209 Acute bronchitis, unspecified: Secondary | ICD-10-CM

## 2016-12-26 ENCOUNTER — Other Ambulatory Visit: Payer: Self-pay | Admitting: Pediatrics

## 2016-12-26 DIAGNOSIS — J440 Chronic obstructive pulmonary disease with acute lower respiratory infection: Secondary | ICD-10-CM

## 2016-12-26 DIAGNOSIS — J209 Acute bronchitis, unspecified: Secondary | ICD-10-CM

## 2017-03-06 ENCOUNTER — Other Ambulatory Visit: Payer: Self-pay | Admitting: *Deleted

## 2017-03-06 DIAGNOSIS — J209 Acute bronchitis, unspecified: Secondary | ICD-10-CM

## 2017-03-06 DIAGNOSIS — J440 Chronic obstructive pulmonary disease with acute lower respiratory infection: Secondary | ICD-10-CM

## 2017-03-06 MED ORDER — TIOTROPIUM BROMIDE MONOHYDRATE 18 MCG IN CAPS: ORAL_CAPSULE | RESPIRATORY_TRACT | 0 refills | Status: DC

## 2017-04-22 ENCOUNTER — Other Ambulatory Visit: Payer: Self-pay | Admitting: Family Medicine

## 2017-05-02 ENCOUNTER — Other Ambulatory Visit: Payer: Self-pay

## 2017-05-02 ENCOUNTER — Encounter (HOSPITAL_COMMUNITY): Payer: Self-pay | Admitting: Emergency Medicine

## 2017-05-02 ENCOUNTER — Emergency Department (HOSPITAL_COMMUNITY)
Admission: EM | Admit: 2017-05-02 | Discharge: 2017-05-02 | Disposition: A | Discharge status: Home / Self Care | Payer: BLUE CROSS/BLUE SHIELD | Attending: Emergency Medicine | Admitting: Emergency Medicine

## 2017-05-02 ENCOUNTER — Emergency Department (HOSPITAL_COMMUNITY): Payer: BLUE CROSS/BLUE SHIELD

## 2017-05-02 DIAGNOSIS — F17210 Nicotine dependence, cigarettes, uncomplicated: Secondary | ICD-10-CM | POA: Diagnosis not present

## 2017-05-02 DIAGNOSIS — J40 Bronchitis, not specified as acute or chronic: Secondary | ICD-10-CM | POA: Diagnosis not present

## 2017-05-02 DIAGNOSIS — R52 Pain, unspecified: Secondary | ICD-10-CM

## 2017-05-02 DIAGNOSIS — Z79899 Other long term (current) drug therapy: Secondary | ICD-10-CM | POA: Insufficient documentation

## 2017-05-02 DIAGNOSIS — I10 Essential (primary) hypertension: Secondary | ICD-10-CM | POA: Diagnosis not present

## 2017-05-02 DIAGNOSIS — J449 Chronic obstructive pulmonary disease, unspecified: Secondary | ICD-10-CM | POA: Insufficient documentation

## 2017-05-02 DIAGNOSIS — J45909 Unspecified asthma, uncomplicated: Secondary | ICD-10-CM | POA: Diagnosis not present

## 2017-05-02 DIAGNOSIS — R05 Cough: Secondary | ICD-10-CM | POA: Diagnosis present

## 2017-05-02 LAB — CBC WITH DIFFERENTIAL/PLATELET
Basophils Absolute: 0 10*3/uL (ref 0.0–0.1)
Basophils Relative: 0 %
Eosinophils Absolute: 0.2 10*3/uL (ref 0.0–0.7)
Eosinophils Relative: 4 %
HEMATOCRIT: 41.8 % (ref 36.0–46.0)
HEMOGLOBIN: 13.2 g/dL (ref 12.0–15.0)
LYMPHS PCT: 44 %
Lymphs Abs: 2.2 10*3/uL (ref 0.7–4.0)
MCH: 31.6 pg (ref 26.0–34.0)
MCHC: 31.6 g/dL (ref 30.0–36.0)
MCV: 100 fL (ref 78.0–100.0)
MONOS PCT: 13 %
Monocytes Absolute: 0.7 10*3/uL (ref 0.1–1.0)
NEUTROS PCT: 39 %
Neutro Abs: 1.9 10*3/uL (ref 1.7–7.7)
Platelets: 216 10*3/uL (ref 150–400)
RBC: 4.18 MIL/uL (ref 3.87–5.11)
RDW: 13.6 % (ref 11.5–15.5)
WBC: 4.9 10*3/uL (ref 4.0–10.5)

## 2017-05-02 LAB — BASIC METABOLIC PANEL
Anion gap: 11 (ref 5–15)
BUN: 8 mg/dL (ref 6–20)
CHLORIDE: 103 mmol/L (ref 101–111)
CO2: 26 mmol/L (ref 22–32)
Calcium: 8.8 mg/dL — ABNORMAL LOW (ref 8.9–10.3)
Creatinine, Ser: 0.8 mg/dL (ref 0.44–1.00)
GFR calc Af Amer: >60 mL/min (ref >60)
GFR calc non Af Amer: >60 mL/min (ref >60)
GLUCOSE: 112 mg/dL — AB (ref 65–99)
POTASSIUM: 3.3 mmol/L — AB (ref 3.5–5.1)
Sodium: 140 mmol/L (ref 135–145)

## 2017-05-02 LAB — TROPONIN I: Troponin I: <0.03 ng/mL (ref <0.03)

## 2017-05-02 MED ORDER — IPRATROPIUM-ALBUTEROL 0.5-2.5 (3) MG/3ML IN SOLN
3.0000 mL | Freq: Once | RESPIRATORY_TRACT | Status: AC
Start: 2017-05-02 — End: 2017-05-02
  Administered 2017-05-02: 3 mL via RESPIRATORY_TRACT
  Filled 2017-05-02: qty 3

## 2017-05-02 MED ORDER — PREDNISONE 20 MG PO TABS: 40.0000 mg | ORAL_TABLET | Freq: Every day | ORAL | 0 refills | Status: DC

## 2017-05-02 MED ORDER — ALBUTEROL SULFATE (2.5 MG/3ML) 0.083% IN NEBU
2.5000 mg | INHALATION_SOLUTION | Freq: Once | RESPIRATORY_TRACT | Status: AC
  Administered 2017-05-02: 2.5 mg via RESPIRATORY_TRACT
  Filled 2017-05-02: qty 3

## 2017-05-02 MED ORDER — AZITHROMYCIN 250 MG PO TABS: ORAL_TABLET | ORAL | 0 refills | Status: DC

## 2017-05-02 NOTE — ED Triage Notes (Addendum)
Pt c/o of cough, congestion x2 week.  Pt now states having right and left rib pain

## 2017-05-02 NOTE — ED Notes (Signed)
RT called for breathing treatment.  Lab at bedside to obtain blood.

## 2017-05-02 NOTE — Discharge Instructions (Addendum)
Continue your albuterol inhaler 2 puffs 4 times a day as needed.  Follow-up with your doctor on Monday.  Return here if needed.  You may take OTC Delsym as directed for cough

## 2017-05-04 NOTE — ED Provider Notes (Signed)
[facility] EMERGENCY DEPARTMENT Provider Note   CSN: [id number] Arrival date & time: 05/02/17  1611     History   Chief Complaint Chief Complaint  Patient presents with  . Cough    HPI Courtney Mcconnell is a 62 y.o. female.  HPI   Courtney Mcconnell is a 62 y.o. female with hx of emphysema, presents to the Emergency Department complaining of cough and chest congestion for 2 weeks.  She describes an intermittently productive cough and bilateral rib pain associated with cough.  She denies fever, vomiting, nasal congestion, and wheezing.  She has been using an albuterol inhaler without relief.  No new medications or hemoptysis.    Past Medical History:  Diagnosis Date  . Asthma   . Bursitis of left hip    right  . COPD (chronic obstructive pulmonary disease) ([place])   . GERD (gastroesophageal reflux disease)   . Headache(784.0)   . Hypertension   . Pneumonia   . Seizure ([place])    most recent 12/12/14  . Sleep apnea     Patient Active Problem List   Diagnosis Date Noted  . Pulmonary nodules 12/08/2015  . 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 Sleep apnea 04/06/2015  . Pulmonary emphysema ([place]) 03/02/2015  . Smoker 02/16/2015  . Seizure disorder ([place]) 01/21/2015  . Focal epilepsy with impairment of consciousness ([place]) 12/13/2014  . Seizures ([place]) 04/18/2014  . HTN (hypertension) 04/18/2014  . 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 Tobacco use disorder 07/19/2012    Past Surgical History:  Procedure Laterality Date  . ABDOMINAL HYSTERECTOMY    . APPENDECTOMY    . CATARACT EXTRACTION Bilateral   . CHOLECYSTECTOMY    . heal spur     right  . LASER PHOTO ABLATION Right 08/27/2013   Procedure: LASER PHOTO ABLATION;  Surgeon: Hayden Pedro, MD;  Location: [facility];  Service: Ophthalmology;  Laterality: Right;  Headscope laser AND Endolaser  . MEMBRANE PEEL Right 08/27/2013   Procedure: MEMBRANE PEEL;  Surgeon: Hayden Pedro, MD;  Location: [facility];  Service:  Ophthalmology;  Laterality: Right;  . PARS PLANA VITRECTOMY Right 08/27/2013   Procedure: PARS PLANA VITRECTOMY WITH 25 GAUGE;  Surgeon: Hayden Pedro, MD;  Location: [facility];  Service: Ophthalmology;  Laterality: Right;     OB History    Gravida  2   Para  2   Term  2   Preterm      AB      Living  3     SAB      TAB      Ectopic      Multiple      Live Births               Home Medications    Prior to Admission medications   Medication Sig Start Date End Date Taking? Authorizing Provider  albuterol (PROVENTIL HFA;VENTOLIN HFA) 108 (90 Base) MCG/ACT inhaler TAKE 2 PUFFS BY MOUTH EVERY 6 HOURS AS NEEDED FOR WHEEZE OR SHORTNESS OF BREATH 04/22/17   Dettinger, Fransisca Kaufmann, MD  azithromycin (ZITHROMAX) 250 MG tablet Take first 2 tablets together, then 1 every day until finished. 05/02/17   Sarthak Rubenstein, PA-C  baclofen (LIORESAL) 10 MG tablet take 1  Tablet by mouth 3 times daily 12/21/16   Dettinger, Fransisca Kaufmann, MD  budesonide-formoterol (SYMBICORT) 160-4.5 MCG/ACT inhaler Inhale 2 puffs into the lungs 2 (two) times daily. 08/29/16   Dettinger, Fransisca Kaufmann, MD  buPROPion (WELLBUTRIN SR) 150 MG 12 hr tablet Take 1 tablet (150 mg total) by mouth 2 (two) times daily. 08/29/16   Dettinger, Fransisca Kaufmann, MD  cetirizine (ZYRTEC) 10 MG tablet Take 1 Tablet by mouth once daily 09/12/16   Dettinger, Fransisca Kaufmann, MD  cetirizine (ZYRTEC) 10 MG tablet TAKE 1 TABLET (10 MG TOTAL) BY MOUTH DAILY. 11/15/16   Dettinger, Fransisca Kaufmann, MD  fluticasone (FLONASE) 50 MCG/ACT nasal spray PLACE 2 SPRAYS INTO BOTH NOSTRILS DAILY. 12/30/15   Dettinger, Fransisca Kaufmann, MD  lacosamide (VIMPAT) 200 MG TABS tablet Take 1 tablet (200 mg total) by mouth 2 (two) times daily. 01/21/15   Penumalli, Earlean Polka, MD  nitroGLYCERIN (NITROSTAT) 0.4 MG SL tablet Place 1 tablet (0.4 mg total) under the tongue every 5 (five) minutes as needed for chest pain. 09/01/15   Nat Christen, MD  omeprazole (PRILOSEC) 20 MG capsule Take 20 mg by mouth  daily.    [provider]  predniSONE (DELTASONE) 20 MG tablet Take 2 tablets (40 mg total) by mouth daily. For 5 days 05/02/17   Kem Parkinson, PA-C  Spacer/Aero Chamber Mouthpiece MISC 1 each by Does not apply route every 6 (six) hours as needed. 04/20/16   Eustaquio Maize, MD  tiotropium (SPIRIVA HANDIHALER) 18 MCG inhalation capsule USE 1 PUFF DAILY 03/06/17   Dettinger, Fransisca Kaufmann, MD    Family History Family History  Problem Relation Age of Onset  . Diabetes Father   . Hypertension Father   . Hyperlipidemia Father   . COPD Mother   . Diabetes Brother   . Stroke Paternal Grandmother   . Cancer Sister   . Cancer Paternal Uncle   . Colon cancer Neg Hx     Social History Social History   Tobacco Use  . Smoking status: Current Every Day Smoker    Packs/day: 1.00    Years: 42.00    Pack years: 42.00    Types: Cigarettes    Last attempt to quit: 03/20/2014    Years since quitting: 3.1  . Smokeless tobacco: Never Used  . Tobacco comment: 01/21/15 restarted smoking 4 mos ago  Substance Use Topics  . Alcohol use: No    Alcohol/week: 0.0 oz  . Drug use: No     Allergies   Bee venom; Amoxicillin-pot clavulanate; and Doxycycline hyclate   Review of Systems Review of Systems  Constitutional: Negative for appetite change, chills and fever.  HENT: Negative for congestion, sore throat and trouble swallowing.   Respiratory: Positive for cough. Negative for chest tightness, shortness of breath and wheezing.   Cardiovascular: Negative for chest pain.       Bilateral rib pain with cough  Gastrointestinal: Negative for abdominal pain, nausea and vomiting.  Genitourinary: Negative for dysuria.  Musculoskeletal: Negative for arthralgias.  Skin: Negative for rash.  Neurological: Negative for dizziness, weakness and numbness.  Hematological: Negative for adenopathy.  All other systems reviewed and are negative.    Physical Exam Updated Vital Signs BP (!) 141/77 (BP  Location: Right Wrist)   Pulse 64   Temp 97.9 F (36.6 C) (Oral)   Resp 18   Ht 5\' 8"  (1.727 m)   Wt 107.5 kg (237 lb)   SpO2 97%   BMI 36.04 kg/m   Physical Exam  Constitutional: She is oriented to person, place, and time. She appears well-developed and well-nourished. No distress.  HENT:  Mouth/Throat: Oropharynx is clear and moist.  Neck: Normal range of motion. No JVD present.  Cardiovascular: Normal rate and regular rhythm.  No murmur heard. Pulmonary/Chest: No respiratory distress. She has wheezes. She has no rales.  Few expiratory wheezes and slightly diminished lung sounds bilaterally.  Pt able to speak in full sentences w/o respiratory distress.  Abdominal: Soft. She exhibits no distension. There is no tenderness. There is no guarding.  Musculoskeletal: Normal range of motion. She exhibits no edema.  Neurological: She is alert and oriented to person, place, and time. No sensory deficit.  Skin: Skin is warm. Capillary refill takes less than 2 seconds.  Psychiatric: She has a normal mood and affect.  Nursing note and vitals reviewed.    ED Treatments / Results  Labs (all labs ordered are listed, but only abnormal results are displayed) Labs Reviewed  BASIC METABOLIC PANEL - Abnormal; Notable for the following components:      Result Value   Potassium 3.3 (*)    Glucose, Bld 112 (*)    Calcium 8.8 (*)    All other components within normal limits  CBC WITH DIFFERENTIAL/PLATELET  TROPONIN I    EKG EKG Interpretation  Date/Time:  Thursday May 02 2017 17:21:35 EDT Ventricular Rate:  63 PR Interval:  180 QRS Duration: 100 QT Interval:  454 QTC Calculation: 464 R Axis:   -11 Text Interpretation:  Normal sinus rhythm Incomplete right bundle branch block Minimal voltage criteria for LVH, may be normal variant Borderline ECG no significant change since priors Confirmed by Sherwood Gambler [phone]) on 05/02/2017 5:57:18 PM   Radiology Dg Ribs Bilateral  W/chest  Result Date: 05/02/2017 CLINICAL DATA:  Bilateral axillary rib pain.  Productive cough. EXAM: BILATERAL RIBS AND CHEST - 4+ VIEW COMPARISON:  None. FINDINGS: No fracture or other bone lesions are seen involving the ribs. There is no evidence of pneumothorax or pleural effusion. Both lungs are clear. Heart size and mediastinal contours are within normal limits. IMPRESSION: Negative. Electronically Signed   By: Fidela Salisbury M.D.   On: 05/02/2017 16:45    Procedures Procedures (including critical care time)  Medications Ordered in ED Medications  ipratropium-albuterol (DUONEB) 0.5-2.5 (3) MG/3ML nebulizer solution 3 mL (3 mLs Nebulization Given 05/02/17 1742)  albuterol (PROVENTIL) (2.5 MG/3ML) 0.083% nebulizer solution 2.5 mg (2.5 mg Nebulization Given 05/02/17 1742)     Initial Impression / Assessment and Plan / ED Course  I have reviewed the triage vital signs and the nursing notes.  Pertinent labs & imaging results that were available during my care of the patient were reviewed by me and considered in my medical decision making (see chart for details).     Pt well appearing.  Vitals reviewed, no hypoxia or tachycardia.    On recheck, after albuterol neb, pt reports feeling better, lung sounds improved and pt states she is ready for d/c.  Labs, EKG and CXR are reassuring.  Has albuterol MDI.  Agrees to tx plan and close outpatient f/u.  Return precautions discussed.    Final Clinical Impressions(s) / ED Diagnoses   Final diagnoses:  Bronchitis    ED Discharge Orders        Ordered    azithromycin (ZITHROMAX) 250 MG tablet     05/02/17 1834    predniSONE (DELTASONE) 20 MG tablet  Daily     05/02/17 1834       Kem Parkinson, PA-C 05/04/17 1302    Sherwood Gambler, MD 05/06/17 0800

## 2017-05-06 ENCOUNTER — Encounter: Payer: Self-pay | Admitting: Family Medicine

## 2017-05-06 ENCOUNTER — Other Ambulatory Visit: Payer: Self-pay | Admitting: Family Medicine

## 2017-05-06 ENCOUNTER — Ambulatory Visit: Payer: BLUE CROSS/BLUE SHIELD | Admitting: Family Medicine

## 2017-05-06 VITALS — BP 138/81 | HR 69 | Temp 99.1°F | Ht 68.0 in | Wt 231.0 lb

## 2017-05-06 DIAGNOSIS — D225 Melanocytic nevi of trunk: Secondary | ICD-10-CM

## 2017-05-06 DIAGNOSIS — L820 Inflamed seborrheic keratosis: Secondary | ICD-10-CM | POA: Diagnosis not present

## 2017-05-06 DIAGNOSIS — F17200 Nicotine dependence, unspecified, uncomplicated: Secondary | ICD-10-CM

## 2017-05-06 MED ORDER — BUPROPION HCL ER (XL) 150 MG PO TB24: 150.0000 mg | ORAL_TABLET | Freq: Every day | ORAL | 1 refills | Status: DC

## 2017-05-06 NOTE — Progress Notes (Signed)
BP 138/81   Pulse 69   Temp 99.1 F (37.3 C) (Oral)   Ht 5\' 8"  (1.727 m)   Wt 231 lb (104.8 kg)   BMI 35.12 kg/m    Subjective:    Patient ID: Courtney Mcconnell, female    DOB: [birth year], 62 y.o.   MRN: [id number]  HPI: EZRAH Mcconnell is a 62 y.o. female presenting on 05/06/2017 for ER follow up (Bronchitis, discuss quitting smoking) and Mole removal (abdomen, underneath left arm)   HPI ER follow-up for bronchitis and smoking cessation is coming in today for ER follow-up for smoking cessation and bronchitis.  She says she is feeling a lot better but still has a little bit of congestion left To get her lungs listened to.  She says she did stop smoking as of the ER visit which was last week and has not picked up a cigarette since.  She denies any fevers or chills or shortness of breath or wheezing currently.  Atypical skin lesions Patient has 2 skin lesions on left posterior upper arm that have been there more recently over the past couple years and get irritated by her shirt line when they catch her when she Is on different things.  She denies any change recently in them.  She denies any redness or warmth or swelling with them.  Patient also has another larger atypical mole on her upper abdomen near her bra line that has been there most of her life and has really not changed except for now it catches a lot more on her bra line and has even bled a few times and she would like it removed for that reason.  Relevant past medical, surgical, family and social history reviewed and updated as indicated. Interim medical history since our last visit reviewed. Allergies and medications reviewed and updated.  Review of Systems  Constitutional: Negative for chills and fever.  HENT: Positive for congestion. Negative for ear discharge, ear pain, postnasal drip, rhinorrhea, sinus pressure, sneezing and sore throat.   Eyes: Negative for pain, redness and visual disturbance.  Respiratory: Positive for  cough. Negative for chest tightness, shortness of breath and wheezing.   Cardiovascular: Negative for chest pain and leg swelling.  Genitourinary: Negative for difficulty urinating and dysuria.  Musculoskeletal: Negative for back pain and gait problem.  Skin: Positive for color change. Negative for rash.  Neurological: Negative for light-headedness and headaches.  Psychiatric/Behavioral: Negative for agitation and behavioral problems.  All other systems reviewed and are negative.   Per HPI unless specifically indicated above   Allergies as of 05/06/2017      Reactions   Bee Venom Swelling   Amoxicillin-pot Clavulanate Rash   Doxycycline Hyclate Rash   Rash and itching      Medication List        Accurate as of 05/06/17  2:05 PM. Always use your most recent med list.          albuterol 108 (90 Base) MCG/ACT inhaler Commonly known as:  PROVENTIL HFA;VENTOLIN HFA TAKE 2 PUFFS BY MOUTH EVERY 6 HOURS AS NEEDED FOR WHEEZE OR SHORTNESS OF BREATH   buPROPion 150 MG 24 hr tablet Commonly known as:  WELLBUTRIN XL Take 1 tablet (150 mg total) by mouth daily.   fluticasone 50 MCG/ACT nasal spray Commonly known as:  FLONASE PLACE 2 SPRAYS INTO BOTH NOSTRILS DAILY.   lacosamide 200 MG Tabs tablet Commonly known as:  VIMPAT Take 1 tablet (200 mg total) by mouth 2 (  two) times daily.   nitroGLYCERIN 0.4 MG SL tablet Commonly known as:  NITROSTAT Place 1 tablet (0.4 mg total) under the tongue every 5 (five) minutes as needed for chest pain.   omeprazole 20 MG capsule Commonly known as:  PRILOSEC Take 20 mg by mouth daily.   Spacer/Aero Chamber Mouthpiece Misc 1 each by Does not apply route every 6 (six) hours as needed.   tiotropium 18 MCG inhalation capsule Commonly known as:  SPIRIVA HANDIHALER USE 1 PUFF DAILY          Objective:    BP 138/81   Pulse 69   Temp 99.1 F (37.3 C) (Oral)   Ht 5\' 8"  (1.727 m)   Wt 231 lb (104.8 kg)   BMI 35.12 kg/m   Wt Readings  from Last 3 Encounters:  05/06/17 231 lb (104.8 kg)  05/02/17 237 lb (107.5 kg)  11/21/16 237 lb (107.5 kg)    Physical Exam  Constitutional: She is oriented to person, place, and time. She appears well-developed and well-nourished. No distress.  Eyes: Conjunctivae are normal.  Neck: Neck supple. No thyromegaly present.  Cardiovascular: Normal rate, regular rhythm, normal heart sounds and intact distal pulses.  No murmur heard. Pulmonary/Chest: Effort normal and breath sounds normal. No respiratory distress. She has no wheezes.  Lymphadenopathy:    She has no cervical adenopathy.  Neurological: She is alert and oriented to person, place, and time. Coordination normal.  Skin: Skin is warm and dry. No rash noted. She is not diaphoretic.     Psychiatric: She has a normal mood and affect. Her behavior is normal.  Nursing note and vitals reviewed.   Shave biopsy Verbal consent was obtained for procedure.  Topical Betadine was used for cleansing and 5 cc of 2% lidocaine without epinephrine were used for anesthesia.  Shave biopsy was performed the lesion was sent for pathology of 2 cm x 1 cm nevus.  Cryotherapy: Verbal consent obtained.  Performed cryotherapy with 3-10-second bursts on each of the 2 lesions on right posterior upper arm.  Patient tolerated well.  Warned of blistering.     Assessment & Plan:   Problem List Items Addressed This Visit      Other   Tobacco use disorder   Relevant Medications   buPROPion (WELLBUTRIN XL) 150 MG 24 hr tablet   Smoker - Primary   Relevant Medications   buPROPion (WELLBUTRIN XL) 150 MG 24 hr tablet    Other Visit Diagnoses    Atypical nevus of abdominal wall       Patient has a large nevus, has not changed but she does catch it with her bra line she wants it removed.   Relevant Orders   Pathology   Seborrheic keratosis, inflamed           Follow up plan: Return if symptoms worsen or fail to improve.  Counseling provided for all of  the vaccine components No orders of the defined types were placed in this encounter.   Caryl Pina, MD [facility] 05/06/2017, 2:05 PM

## 2017-05-08 LAB — PATHOLOGY

## 2017-05-30 ENCOUNTER — Other Ambulatory Visit: Payer: Self-pay | Admitting: *Deleted

## 2017-05-30 ENCOUNTER — Other Ambulatory Visit: Payer: Self-pay | Admitting: Family Medicine

## 2017-05-30 DIAGNOSIS — J209 Acute bronchitis, unspecified: Secondary | ICD-10-CM

## 2017-05-30 DIAGNOSIS — F17200 Nicotine dependence, unspecified, uncomplicated: Secondary | ICD-10-CM

## 2017-05-30 DIAGNOSIS — J440 Chronic obstructive pulmonary disease with acute lower respiratory infection: Secondary | ICD-10-CM

## 2017-05-30 MED ORDER — BUPROPION HCL ER (XL) 150 MG PO TB24: 150.0000 mg | ORAL_TABLET | Freq: Every day | ORAL | 0 refills | Status: DC

## 2017-06-03 ENCOUNTER — Other Ambulatory Visit: Payer: Self-pay | Admitting: Family Medicine

## 2017-06-03 DIAGNOSIS — J439 Emphysema, unspecified: Secondary | ICD-10-CM

## 2017-08-22 ENCOUNTER — Other Ambulatory Visit: Payer: Self-pay | Admitting: Family Medicine

## 2017-08-22 DIAGNOSIS — J209 Acute bronchitis, unspecified: Secondary | ICD-10-CM

## 2017-08-22 DIAGNOSIS — J440 Chronic obstructive pulmonary disease with acute lower respiratory infection: Secondary | ICD-10-CM

## 2017-08-27 ENCOUNTER — Other Ambulatory Visit: Payer: Self-pay | Admitting: Family Medicine

## 2017-08-27 DIAGNOSIS — F17200 Nicotine dependence, unspecified, uncomplicated: Secondary | ICD-10-CM

## 2017-08-31 ENCOUNTER — Encounter (HOSPITAL_COMMUNITY): Payer: Self-pay | Admitting: Emergency Medicine

## 2017-08-31 ENCOUNTER — Emergency Department (HOSPITAL_COMMUNITY)
Admission: EM | Admit: 2017-08-31 | Discharge: 2017-08-31 | Disposition: A | Discharge status: Home / Self Care | Payer: Medicare Other | Attending: Emergency Medicine | Admitting: Emergency Medicine

## 2017-08-31 ENCOUNTER — Emergency Department (HOSPITAL_COMMUNITY): Payer: Medicare Other

## 2017-08-31 ENCOUNTER — Other Ambulatory Visit: Payer: Self-pay

## 2017-08-31 DIAGNOSIS — J209 Acute bronchitis, unspecified: Secondary | ICD-10-CM | POA: Insufficient documentation

## 2017-08-31 DIAGNOSIS — F17210 Nicotine dependence, cigarettes, uncomplicated: Secondary | ICD-10-CM | POA: Diagnosis not present

## 2017-08-31 DIAGNOSIS — R0602 Shortness of breath: Secondary | ICD-10-CM | POA: Diagnosis not present

## 2017-08-31 DIAGNOSIS — R05 Cough: Secondary | ICD-10-CM | POA: Diagnosis not present

## 2017-08-31 DIAGNOSIS — Z79899 Other long term (current) drug therapy: Secondary | ICD-10-CM | POA: Diagnosis not present

## 2017-08-31 DIAGNOSIS — I10 Essential (primary) hypertension: Secondary | ICD-10-CM | POA: Diagnosis not present

## 2017-08-31 DIAGNOSIS — J449 Chronic obstructive pulmonary disease, unspecified: Secondary | ICD-10-CM | POA: Diagnosis not present

## 2017-08-31 DIAGNOSIS — M5489 Other dorsalgia: Secondary | ICD-10-CM | POA: Diagnosis present

## 2017-08-31 DIAGNOSIS — R062 Wheezing: Secondary | ICD-10-CM | POA: Diagnosis not present

## 2017-08-31 DIAGNOSIS — R0789 Other chest pain: Secondary | ICD-10-CM | POA: Diagnosis not present

## 2017-08-31 LAB — CBC WITH DIFFERENTIAL/PLATELET
Basophils Absolute: 0 10*3/uL (ref 0.0–0.1)
Basophils Relative: 0 %
EOS ABS: 0.1 10*3/uL (ref 0.0–0.7)
EOS PCT: 1 %
HCT: 40 % (ref 36.0–46.0)
HEMOGLOBIN: 13.2 g/dL (ref 12.0–15.0)
LYMPHS ABS: 2.4 10*3/uL (ref 0.7–4.0)
Lymphocytes Relative: 27 %
MCH: 32.4 pg (ref 26.0–34.0)
MCHC: 33 g/dL (ref 30.0–36.0)
MCV: 98 fL (ref 78.0–100.0)
MONOS PCT: 6 %
Monocytes Absolute: 0.5 10*3/uL (ref 0.1–1.0)
NEUTROS PCT: 66 %
Neutro Abs: 5.9 10*3/uL (ref 1.7–7.7)
Platelets: 260 10*3/uL (ref 150–400)
RBC: 4.08 MIL/uL (ref 3.87–5.11)
RDW: 13.5 % (ref 11.5–15.5)
WBC: 8.9 10*3/uL (ref 4.0–10.5)

## 2017-08-31 LAB — BASIC METABOLIC PANEL
Anion gap: 8 (ref 5–15)
BUN: 15 mg/dL (ref 8–23)
CHLORIDE: 102 mmol/L (ref 98–111)
CO2: 26 mmol/L (ref 22–32)
CREATININE: 0.9 mg/dL (ref 0.44–1.00)
Calcium: 8.8 mg/dL — ABNORMAL LOW (ref 8.9–10.3)
GFR calc Af Amer: >60 mL/min (ref >60)
GFR calc non Af Amer: >60 mL/min (ref >60)
Glucose, Bld: 132 mg/dL — ABNORMAL HIGH (ref 70–99)
Potassium: 3.8 mmol/L (ref 3.5–5.1)
Sodium: 136 mmol/L (ref 135–145)

## 2017-08-31 LAB — TROPONIN I: Troponin I: <0.03 ng/mL (ref <0.03)

## 2017-08-31 LAB — D-DIMER, QUANTITATIVE: D-Dimer, Quant: 0.39 ug/mL-FEU (ref 0.00–0.50)

## 2017-08-31 MED ORDER — NAPROXEN 500 MG PO TABS: 500.0000 mg | ORAL_TABLET | Freq: Two times a day (BID) | ORAL | 0 refills | Status: DC

## 2017-08-31 MED ORDER — AZITHROMYCIN 250 MG PO TABS
500.0000 mg | ORAL_TABLET | Freq: Once | ORAL | Status: AC
  Administered 2017-08-31: 500 mg via ORAL
  Filled 2017-08-31: qty 2

## 2017-08-31 MED ORDER — ALBUTEROL SULFATE (2.5 MG/3ML) 0.083% IN NEBU
2.5000 mg | INHALATION_SOLUTION | Freq: Once | RESPIRATORY_TRACT | Status: AC
  Administered 2017-08-31: 2.5 mg via RESPIRATORY_TRACT
  Filled 2017-08-31: qty 3

## 2017-08-31 MED ORDER — BENZONATATE 100 MG PO CAPS: 200.0000 mg | ORAL_CAPSULE | Freq: Three times a day (TID) | ORAL | 0 refills | Status: DC | PRN

## 2017-08-31 MED ORDER — AZITHROMYCIN 250 MG PO TABS: ORAL_TABLET | ORAL | 0 refills | Status: DC

## 2017-08-31 MED ORDER — IPRATROPIUM-ALBUTEROL 0.5-2.5 (3) MG/3ML IN SOLN
3.0000 mL | Freq: Once | RESPIRATORY_TRACT | Status: AC
  Administered 2017-08-31: 3 mL via RESPIRATORY_TRACT
  Filled 2017-08-31: qty 3

## 2017-08-31 NOTE — ED Provider Notes (Signed)
[facility] EMERGENCY DEPARTMENT Provider Note   CSN: [id number] Arrival date & time: 08/31/17  1642     History   Chief Complaint Chief Complaint  Patient presents with  . Back Pain    HPI Courtney Mcconnell is a 62 y.o. female with a history as outlined below, most pertinent for COPD, GERD, HTN and history of pneumonia occurring several years ago, developed cough with productive yellow sputum x 3 days with intermittent wheezing, shortness of breath and development of bilateral upper back and upper midsternal chest pain which is triggered by coughing, but endorses constant pain in the back which reminds her of her previous pneumonia pain.  She reports subjective fever. She denies coryza sx, abdominal pain, n/v, dizziness, headache, lower extremity pain or edema.  She takes spiriva daily, and also added albuterol today with improvement in wheezing.  She quite smoking cigarettes about 3 months ago.  HPI  Past Medical History:  Diagnosis Date  . Asthma   . Bursitis of left hip    right  . COPD (chronic obstructive pulmonary disease) ([place])   . GERD (gastroesophageal reflux disease)   . Headache(784.0)   . Hypertension   . Pneumonia   . Seizure ([place])    most recent 12/12/14  . Sleep apnea     Patient Active Problem List   Diagnosis Date Noted  . Pulmonary nodules 12/08/2015  . 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 Sleep apnea 04/06/2015  . Pulmonary emphysema ([place]) 03/02/2015  . Smoker 02/16/2015  . Seizure disorder ([place]) 01/21/2015  . Focal epilepsy with impairment of consciousness ([place]) 12/13/2014  . Seizures ([place]) 04/18/2014  . HTN (hypertension) 04/18/2014  . 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 Tobacco use disorder 07/19/2012    Past Surgical History:  Procedure Laterality Date  . ABDOMINAL HYSTERECTOMY    . APPENDECTOMY    . CATARACT EXTRACTION Bilateral   . CHOLECYSTECTOMY    . heal spur     right  . LASER PHOTO ABLATION Right 08/27/2013   Procedure: LASER PHOTO ABLATION;  Surgeon: Hayden Pedro, MD;  Location: [place];  Service: Ophthalmology;  Laterality: Right;  Headscope laser AND Endolaser  . MEMBRANE PEEL Right 08/27/2013   Procedure: MEMBRANE PEEL;  Surgeon: Hayden Pedro, MD;  Location: [place];  Service: Ophthalmology;  Laterality: Right;  . PARS PLANA VITRECTOMY Right 08/27/2013   Procedure: PARS PLANA VITRECTOMY WITH 25 GAUGE;  Surgeon: Hayden Pedro, MD;  Location: [place];  Service: Ophthalmology;  Laterality: Right;     OB History    Gravida  2   Para  2   Term  2   Preterm      AB      Living  3     SAB      TAB      Ectopic      Multiple      Live Births               Home Medications    Prior to Admission medications   Medication Sig Start Date End Date Taking? Authorizing Provider  albuterol (PROVENTIL HFA;VENTOLIN HFA) 108 (90 Base) MCG/ACT inhaler TAKE 2 PUFFS BY MOUTH EVERY 6 HOURS AS NEEDED FOR WHEEZE OR SHORTNESS OF BREATH 04/22/17  Yes Dettinger, Fransisca Kaufmann, MD  buPROPion (WELLBUTRIN XL) 150 MG 24 hr tablet Take 1 tablet (150 mg total) by mouth daily. 05/30/17  Yes Dettinger, Fransisca Kaufmann, MD  cetirizine (ZYRTEC) 10 MG  tablet TAKE 1 TABLET (10 MG TOTAL) BY MOUTH DAILY. 06/03/17  Yes Dettinger, Fransisca Kaufmann, MD  fluticasone (FLONASE) 50 MCG/ACT nasal spray PLACE 2 SPRAYS INTO BOTH NOSTRILS DAILY. 12/30/15  Yes Dettinger, Fransisca Kaufmann, MD  lacosamide (VIMPAT) 200 MG TABS tablet Take 1 tablet (200 mg total) by mouth 2 (two) times daily. 01/21/15  Yes Penumalli, Earlean Polka, MD  nitroGLYCERIN (NITROSTAT) 0.4 MG SL tablet Place 1 tablet (0.4 mg total) under the tongue every 5 (five) minutes as needed for chest pain. 09/01/15  Yes Nat Christen, MD  omeprazole (PRILOSEC) 20 MG capsule Take 20 mg by mouth daily.   Yes [provider]  Spacer/Aero Chamber Mouthpiece MISC 1 each by Does not apply route every 6 (six) hours as needed. 04/20/16  Yes Eustaquio Maize, MD  tiotropium (SPIRIVA HANDIHALER) 18 MCG  inhalation capsule USE 1 PUFF BY MOUTH ONCE DAILY 08/23/17  Yes Dettinger, Fransisca Kaufmann, MD  zonisamide (ZONEGRAN) 100 MG capsule Take 200 mg by mouth at bedtime. 06/13/17  Yes [provider]  azithromycin (ZITHROMAX) 250 MG tablet Take one tablet daily for 4 days 08/31/17   Evalee Jefferson, PA-C  benzonatate (TESSALON) 100 MG capsule Take 2 capsules (200 mg total) by mouth 3 (three) times daily as needed. 08/31/17   Evalee Jefferson, PA-C  naproxen (NAPROSYN) 500 MG tablet Take 1 tablet (500 mg total) by mouth 2 (two) times daily. 08/31/17   Evalee Jefferson, PA-C    Family History Family History  Problem Relation Age of Onset  . Diabetes Father   . Hypertension Father   . Hyperlipidemia Father   . COPD Mother   . Diabetes Brother   . Stroke Paternal Grandmother   . Cancer Sister   . Cancer Paternal Uncle   . Colon cancer Neg Hx     Social History Social History   Tobacco Use  . Smoking status: Current Every Day Smoker    Packs/day: 0.25    Years: 42.00    Pack years: 10.50    Types: Cigarettes    Last attempt to quit: 03/20/2014    Years since quitting: 3.4  . Smokeless tobacco: Never Used  . Tobacco comment: 01/21/15 restarted smoking 4 mos ago  Substance Use Topics  . Alcohol use: No    Alcohol/week: 0.0 oz  . Drug use: No     Allergies   Bee venom; Amoxicillin-pot clavulanate; and Doxycycline hyclate   Review of Systems Review of Systems  Constitutional: Positive for fever. Negative for chills.  HENT: Negative for congestion, ear pain, rhinorrhea and sore throat.   Eyes: Negative.   Respiratory: Positive for cough, chest tightness, shortness of breath and wheezing.   Cardiovascular: Positive for chest pain. Negative for palpitations and leg swelling.  Gastrointestinal: Negative for abdominal pain, nausea and vomiting.  Genitourinary: Negative.   Musculoskeletal: Positive for back pain. Negative for arthralgias, joint swelling and neck pain.  Skin: Negative.  Negative for  rash and wound.  Neurological: Negative for dizziness, weakness, light-headedness, numbness and headaches.  Psychiatric/Behavioral: Negative.      Physical Exam Updated Vital Signs BP (!) 120/91   Pulse 75   Temp 97.9 F (36.6 C) (Oral)   Resp (!) 21   Ht 5\' 6"  (1.676 m)   Wt 104.3 kg (230 lb)   SpO2 100%   BMI 37.12 kg/m   Physical Exam  Constitutional: She appears well-developed and well-nourished.  HENT:  Head: Normocephalic and atraumatic.  Eyes: Conjunctivae are normal.  Neck: Normal range of motion.  Cardiovascular: Normal rate, regular rhythm, normal heart sounds and intact distal pulses.  Pulmonary/Chest: Effort normal. No respiratory distress. She has no wheezes. She has rhonchi in the right lower field and the left lower field.  Rhonchi bilateral bases, left > right  Abdominal: Soft. Bowel sounds are normal. There is no tenderness.  Musculoskeletal: Normal range of motion.  Neurological: She is alert.  Skin: Skin is warm and dry.  Psychiatric: She has a normal mood and affect.  Nursing note and vitals reviewed.    ED Treatments / Results  Labs (all labs ordered are listed, but only abnormal results are displayed) Labs Reviewed  BASIC METABOLIC PANEL - Abnormal; Notable for the following components:      Result Value   Glucose, Bld 132 (*)    Calcium 8.8 (*)    All other components within normal limits  D-DIMER, QUANTITATIVE (NOT AT [facility])  TROPONIN I  CBC WITH DIFFERENTIAL/PLATELET    EKG EKG Interpretation  Date/Time:  Saturday August 31 2017 17:57:16 EDT Ventricular Rate:  65 PR Interval:    QRS Duration: 107 QT Interval:  427 QTC Calculation: 444 R Axis:   -12 Text Interpretation:  Sinus rhythm RSR' in V1 or V2, right VCD or RVH No significant change since last tracing Confirmed by Dorie Rank [phone]) on 08/31/2017 5:58:51 PM   Radiology Dg Chest 2 View  Result Date: 08/31/2017 CLINICAL DATA:  Cough, shortness of breath, back pain. EXAM:  CHEST - 2 VIEW COMPARISON:  05/02/2017. FINDINGS: The cardiac silhouette remains borderline enlarged. Clear lungs normal vascularity. Mild thoracic spine degenerative changes. Cholecystectomy clips. IMPRESSION: No acute abnormality. Electronically Signed   By: Claudie Revering M.D.   On: 08/31/2017 17:43    Procedures Procedures (including critical care time)  Medications Ordered in ED Medications  azithromycin (ZITHROMAX) tablet 500 mg (has no administration in time range)  albuterol (PROVENTIL) (2.5 MG/3ML) 0.083% nebulizer solution 2.5 mg (2.5 mg Nebulization Given 08/31/17 1809)  ipratropium-albuterol (DUONEB) 0.5-2.5 (3) MG/3ML nebulizer solution 3 mL (3 mLs Nebulization Given 08/31/17 1809)     Initial Impression / Assessment and Plan / ED Course  I have reviewed the triage vital signs and the nursing notes.  Pertinent labs & imaging results that were available during my care of the patient were reviewed by me and considered in my medical decision making (see chart for details).     Imaging, labs and ekg reviewed and discussed with pt.  She has sx suggesting acute bronchitis with a pleuritic component upper back and anterior upper chest wall. Reproducible with cough and palpation.  Perc negative and normal d dimer.  Doubt PE, no suggestion of ACS,  doubt aneurysm, normal great vessels per CT imaging 4/18. She was started on zithromax, also prescribed tessalon and naproxen for sx relief.  Discussed other home tx and f/u with pcp if sx persist, here for worsened sx.  Final Clinical Impressions(s) / ED Diagnoses   Final diagnoses:  Acute bronchitis, unspecified organism    ED Discharge Orders        Ordered    benzonatate (TESSALON) 100 MG capsule  3 times daily PRN     08/31/17 1929    azithromycin (ZITHROMAX) 250 MG tablet     08/31/17 1929    naproxen (NAPROSYN) 500 MG tablet  2 times daily     08/31/17 1929       Landis Martins 08/31/17 1950    Dorie Rank,  MD 09/02/17  1027

## 2017-08-31 NOTE — Discharge Instructions (Signed)
Your chest xray and labs along with your ekg are normal today.  However, given your history of COPD, I am starting you on antibiotics. You have received your first dose here, take your next dose tomorrow evening.  Continue using your home breathing treatments as needed.  Tessalon can help with cough and naproxen can help the back pain associated with coughing. Get rechecked by your doctor if  not improving or for any worsening symptoms (or return here).

## 2017-08-31 NOTE — ED Triage Notes (Addendum)
Pt c/o mid back pain since this morning with productive cough x3 days.  Denies urinary symptoms or new injuries.

## 2017-09-19 ENCOUNTER — Ambulatory Visit: Payer: BLUE CROSS/BLUE SHIELD | Admitting: Family Medicine

## 2017-11-08 ENCOUNTER — Other Ambulatory Visit: Payer: Self-pay | Admitting: Family Medicine

## 2017-11-08 NOTE — Telephone Encounter (Signed)
Last seen 05/06/17

## 2017-11-20 ENCOUNTER — Ambulatory Visit (INDEPENDENT_AMBULATORY_CARE_PROVIDER_SITE_OTHER): Payer: Medicare Other | Admitting: Physician Assistant

## 2017-11-20 ENCOUNTER — Encounter: Payer: Self-pay | Admitting: Physician Assistant

## 2017-11-20 VITALS — BP 138/97 | HR 74 | Temp 98.2°F | Ht 67.0 in | Wt 243.3 lb

## 2017-11-20 DIAGNOSIS — J209 Acute bronchitis, unspecified: Secondary | ICD-10-CM

## 2017-11-20 MED ORDER — NITROGLYCERIN 0.4 MG SL SUBL: 0.4000 mg | SUBLINGUAL_TABLET | SUBLINGUAL | 0 refills | Status: DC | PRN

## 2017-11-20 MED ORDER — LEVOFLOXACIN 500 MG PO TABS: 500.0000 mg | ORAL_TABLET | Freq: Every day | ORAL | 0 refills | Status: DC

## 2017-11-20 NOTE — Progress Notes (Signed)
BP (!) 138/97   Pulse 74   Temp 98.2 F (36.8 C) (Oral)   Ht 5\' 7"  (1.702 m)   Wt 243 lb 4.8 oz (110.4 kg)   BMI 38.11 kg/m    Subjective:    Patient ID: Courtney Mcconnell, female    DOB: 03/19/1955, 62 y.o.   MRN: [id number]  HPI: Courtney Mcconnell is a 62 y.o. female presenting on 11/20/2017 for URI (prod cough, chest tightness) Patient with several days of progressing upper respiratory and bronchial symptoms. Initially there was more upper respiratory congestion. This progressed to having significant cough that is productive throughout the day and severe at night. There is occasional wheezing after coughing. Sometimes there is slight dyspnea on exertion. It is productive mucus that is yellow in color. Denies any blood.    Past Medical History:  Diagnosis Date  . Asthma   . Bursitis of left hip    right  . COPD (chronic obstructive pulmonary disease) ([place])   . GERD (gastroesophageal reflux disease)   . Headache(784.0)   . Hypertension   . Pneumonia   . Seizure ([place])    most recent 12/12/14  . Sleep apnea    Relevant past medical, surgical, family and social history reviewed and updated as indicated. Interim medical history since our last visit reviewed. Allergies and medications reviewed and updated. DATA REVIEWED: CHART IN EPIC  Family History reviewed for pertinent findings.  Review of Systems  Constitutional: Positive for chills and fatigue. Negative for activity change and appetite change.  HENT: Positive for congestion, postnasal drip and sore throat.   Eyes: Negative.   Respiratory: Positive for cough and wheezing.   Cardiovascular: Negative.  Negative for chest pain, palpitations and leg swelling.  Gastrointestinal: Negative.   Genitourinary: Negative.   Musculoskeletal: Negative.   Skin: Negative.   Neurological: Positive for headaches.    Allergies as of 11/20/2017      Reactions   Bee Venom Swelling   Amoxicillin-pot Clavulanate Rash   Doxycycline Hyclate  Rash   Rash and itching      Medication List        Accurate as of 11/20/17 10:08 PM. Always use your most recent med list.          buPROPion 150 MG 24 hr tablet Commonly known as:  WELLBUTRIN XL Take 1 tablet (150 mg total) by mouth daily.   cetirizine 10 MG tablet Commonly known as:  ZYRTEC TAKE 1 TABLET (10 MG TOTAL) BY MOUTH DAILY.   fluticasone 50 MCG/ACT nasal spray Commonly known as:  FLONASE PLACE 2 SPRAYS INTO BOTH NOSTRILS DAILY.   lacosamide 200 MG Tabs tablet Commonly known as:  VIMPAT Take 1 tablet (200 mg total) by mouth 2 (two) times daily.   levofloxacin 500 MG tablet Commonly known as:  LEVAQUIN Take 1 tablet (500 mg total) by mouth daily.   naproxen 500 MG tablet Commonly known as:  NAPROSYN Take 1 tablet (500 mg total) by mouth 2 (two) times daily.   nitroGLYCERIN 0.4 MG SL tablet Commonly known as:  NITROSTAT Place 1 tablet (0.4 mg total) under the tongue every 5 (five) minutes as needed for chest pain.   omeprazole 20 MG capsule Commonly known as:  PRILOSEC Take 20 mg by mouth daily.   PROAIR HFA 108 (90 Base) MCG/ACT inhaler Generic drug:  albuterol TAKE TWO PUFFS EVERY 6 HOURS AS NEEDED FOR WHEEZE OR SHORTNESS OF BREATH   Spacer/Aero Chamber Mouthpiece Misc 1 each by  Does not apply route every 6 (six) hours as needed.   tiotropium 18 MCG inhalation capsule Commonly known as:  SPIRIVA USE 1 PUFF BY MOUTH ONCE DAILY   zonisamide 100 MG capsule Commonly known as:  ZONEGRAN Take 200 mg by mouth at bedtime.          Objective:    BP (!) 138/97   Pulse 74   Temp 98.2 F (36.8 C) (Oral)   Ht 5\' 7"  (1.702 m)   Wt 243 lb 4.8 oz (110.4 kg)   BMI 38.11 kg/m   Allergies  Allergen Reactions  . Bee Venom Swelling  . Amoxicillin-Pot Clavulanate Rash  . Doxycycline Hyclate Rash    Rash and itching    Wt Readings from Last 3 Encounters:  11/20/17 243 lb 4.8 oz (110.4 kg)  08/31/17 230 lb (104.3 kg)  05/06/17 231 lb (104.8 kg)      Physical Exam  Constitutional: She is oriented to person, place, and time. She appears well-developed and well-nourished.  HENT:  Head: Normocephalic and atraumatic.  Right Ear: There is drainage and tenderness.  Left Ear: There is drainage and tenderness.  Nose: Mucosal edema and rhinorrhea present. Right sinus exhibits no maxillary sinus tenderness and no frontal sinus tenderness. Left sinus exhibits no maxillary sinus tenderness and no frontal sinus tenderness.  Mouth/Throat: Oropharyngeal exudate and posterior oropharyngeal erythema present.  Eyes: Pupils are equal, round, and reactive to light. Conjunctivae and EOM are normal.  Neck: Normal range of motion. Neck supple.  Cardiovascular: Normal rate, regular rhythm, normal heart sounds and intact distal pulses.  Pulmonary/Chest: Effort normal. She has wheezes in the right upper field and the left upper field.  Abdominal: Soft. Bowel sounds are normal.  Neurological: She is alert and oriented to person, place, and time. She has normal reflexes.  Skin: Skin is warm and dry. No rash noted.  Psychiatric: She has a normal mood and affect. Her behavior is normal. Judgment and thought content normal.        Assessment & Plan:   1. Acute bronchitis, unspecified organism - levofloxacin (LEVAQUIN) 500 MG tablet; Take 1 tablet (500 mg total) by mouth daily.  Dispense: 10 tablet; Refill: 0   Continue all other maintenance medications as listed above.  Follow up plan: No follow-ups on file.  Educational handout given for Chrisney PA-C [place] [address]  [place], [place] [address] [phone]   11/20/2017, 10:08 PM

## 2017-12-13 ENCOUNTER — Encounter: Payer: Self-pay | Admitting: Pediatrics

## 2017-12-13 ENCOUNTER — Telehealth: Payer: Self-pay

## 2017-12-13 ENCOUNTER — Ambulatory Visit (INDEPENDENT_AMBULATORY_CARE_PROVIDER_SITE_OTHER): Payer: Medicare Other

## 2017-12-13 ENCOUNTER — Ambulatory Visit (INDEPENDENT_AMBULATORY_CARE_PROVIDER_SITE_OTHER): Payer: Medicare Other | Admitting: Pediatrics

## 2017-12-13 VITALS — BP 131/83 | HR 68 | Temp 98.8°F | Ht 67.0 in | Wt 244.0 lb

## 2017-12-13 DIAGNOSIS — S6292XA Unspecified fracture of left wrist and hand, initial encounter for closed fracture: Secondary | ICD-10-CM | POA: Diagnosis not present

## 2017-12-13 DIAGNOSIS — M79642 Pain in left hand: Secondary | ICD-10-CM | POA: Diagnosis not present

## 2017-12-13 DIAGNOSIS — S61412A Laceration without foreign body of left hand, initial encounter: Secondary | ICD-10-CM | POA: Diagnosis not present

## 2017-12-13 DIAGNOSIS — M19042 Primary osteoarthritis, left hand: Secondary | ICD-10-CM | POA: Diagnosis not present

## 2017-12-13 NOTE — Progress Notes (Signed)
  Subjective:   Patient ID: Courtney Mcconnell, female    DOB: [birth year], 62 y.o.   MRN: [id number] CC: left hand pain (had a seizure 6 days ago and hit hand on something, unsure of what)  HPI: Courtney Mcconnell is a 62 y.o. female   Sunday had grand mal seizure, gets them about every 4 mo, following with neurology and not driving.  Family keeps track of seizures to review with neurology at each visit.  Last grand mal seizure several months ago.  Hit her left hand on something.  Caused laceration palm of hand, has had a lot of pain in left hand since then.  Some mild bruising dorsum of hand just distal to wrist.  Continues to have quite a bit of pain in her left hand.  Not able to use normally now.  Last tetanus 2016   Relevant past medical, surgical, family and social history reviewed. Allergies and medications reviewed and updated. Social History   Tobacco Use  Smoking Status Former Smoker  . Packs/day: 0.25  . Years: 42.00  . Pack years: 10.50  . Types: Cigarettes  . Last attempt to quit: 03/20/2014  . Years since quitting: 3.7  Smokeless Tobacco Never Used  Tobacco Comment   01/21/15 restarted smoking 4 mos ago   ROS: Per HPI   Objective:    BP 131/83   Pulse 68   Temp 98.8 F (37.1 C) (Oral)   Ht 5\' 7"  (1.702 m)   Wt 244 lb (110.7 kg)   BMI 38.22 kg/m   Wt Readings from Last 3 Encounters:  12/13/17 244 lb (110.7 kg)  11/20/17 243 lb 4.8 oz (110.4 kg)  08/31/17 230 lb (104.3 kg)    Gen: NAD, alert, cooperative with exam, NCAT EYES: EOMI, no conjunctival injection, or no icterus ENT:  TMs pearly gray b/l, OP without erythema LYMPH: no cervical LAD CV: NRRR, normal S1/S2, no murmur, distal pulses 2+ b/l Resp: CTABL, no wheezes, normal WOB Ext: No edema, warm Neuro: Alert and oriented MSK: Point tenderness fourth proximal metacarpal. Skin: Mild yellow bruising over the dorsum of left hand.  Laceration present palm of left hand.  Edges well approximated.  No bleeding or  oozing.  Preliminary read by Assunta Found, MD:  Fourth proximal metacarpal with irregularity consistent with fracture.  We will follow-up final read.  Assessment & Plan:  Courtney Mcconnell was seen today for left hand pain.  Diagnoses and all orders for this visit:  Closed fracture of left hand, initial encounter Fourth proximal left metacarpal appears fractured on x-ray, also with point tenderness on exam.  Patient put in wrist splint. -     Ambulatory referral to Orthopedic Surgery  Hand pain, left -     DG Hand Complete Left; Future  Laceration of left hand, foreign body presence unspecified, initial encounter Updated on tetanus.  Already healing, no improved reapproximation possible with stitches at this point.  Wound care discussed.  Follow up plan: Return if symptoms worsen or fail to improve. Assunta Found, MD [place]

## 2017-12-13 NOTE — Telephone Encounter (Signed)
Saw Dr. Evette Doffing, has referral to ortho.  Please call daughter with appointment information.  Patient cannot go until Wednesday 11/6 because she has to depend on daughter to drive, patient unable to drive because of seizures.

## 2017-12-18 ENCOUNTER — Ambulatory Visit (INDEPENDENT_AMBULATORY_CARE_PROVIDER_SITE_OTHER): Payer: Medicare Other | Admitting: Orthopaedic Surgery

## 2017-12-25 ENCOUNTER — Ambulatory Visit (INDEPENDENT_AMBULATORY_CARE_PROVIDER_SITE_OTHER): Payer: Medicare Other | Admitting: Orthopaedic Surgery

## 2017-12-25 ENCOUNTER — Encounter (INDEPENDENT_AMBULATORY_CARE_PROVIDER_SITE_OTHER): Payer: Self-pay | Admitting: Orthopaedic Surgery

## 2017-12-25 VITALS — BP 130/81 | HR 75 | Ht 67.0 in | Wt 244.0 lb

## 2017-12-25 DIAGNOSIS — M79642 Pain in left hand: Secondary | ICD-10-CM | POA: Diagnosis not present

## 2017-12-25 NOTE — Progress Notes (Signed)
Office Visit Note   Patient: Courtney Mcconnell           Date of Birth: 09-Jul-1955           MRN: [id number] Visit Date: 12/25/2017              Requested by: Eustaquio Maize, [address], [place] [address] PCP: Dettinger, Fransisca Kaufmann, MD   Assessment & Plan: Visit Diagnoses:  1. Pain of left hand     Plan: Injured left hand after a seizure nearly 3 weeks ago.  X-rays were negative for fracture.  Has been wearing a volar wrist splint.  Exam benign today.  Will remove the splint and see back as needed.  Discussed in detail with Courtney Mcconnell  Follow-Up Instructions: Return if symptoms worsen or fail to improve.   Orders:  No orders of the defined types were placed in this encounter.  No orders of the defined types were placed in this encounter.     Procedures: No procedures performed   Clinical Data: No additional findings.   Subjective: Chief Complaint  Patient presents with  . Follow-up    12/06/17 HAD SEIZURE AND FELL ON WRIST L HAND CLOSED FX WENT TO PCP 10 DAYS LATER HAD XRAYS AND WAS REFERRED HERE  Courtney Mcconnell had a seizure nearly 3 weeks ago and injured her left hand.  She was seen at the hospital emergency room with negative films.  She was placed in a volar wrist splint and asked to follow-up with an orthopedist.  She notes that at present she is not having any pain or numbness or tingling.  Did review the films of her left hand performed on 12/13/2017.  There were no acute findings.  There were some degenerative changes at the thumb metacarpal phalangeal joint.  The carpus was intact.  No evidence of a wrist fracture or fracture of any of the digits  HPI  Review of Systems  Constitutional: Negative for fatigue.  HENT: Positive for ear pain.   Eyes: Negative for pain.  Respiratory: Positive for shortness of breath. Negative for cough.   Cardiovascular: Negative for leg swelling.  Gastrointestinal: Negative for constipation and diarrhea.  Genitourinary:  Negative for difficulty urinating.  Musculoskeletal: Negative for back pain and neck pain.  Skin: Negative for rash.  Neurological: Positive for weakness and numbness.  Hematological: Bruises/bleeds easily.  Psychiatric/Behavioral: Positive for sleep disturbance.     Objective: Vital Signs: BP 130/81 (BP Location: Right Arm, Patient Position: Sitting, Cuff Size: Normal)   Pulse 75   Ht 5\' 7"  (1.702 m)   Wt 244 lb (110.7 kg)   BMI 38.22 kg/m   Physical Exam  Constitutional: She is oriented to person, place, and time. She appears well-developed and well-nourished.  HENT:  Mouth/Throat: Oropharynx is clear and moist.  Eyes: Pupils are equal, round, and reactive to light. EOM are normal.  Pulmonary/Chest: Effort normal.  Neurological: She is alert and oriented to person, place, and time.  Skin: Skin is warm and dry.  Psychiatric: She has a normal mood and affect. Her behavior is normal.    Ortho Exam examination left hand reveal no skin changes.  No swelling of any of the digits.  Neurovascular exam intact.  Able to oppose thumb the little finger.  No pain over the median nerve.  No triggering.  Skin intact.  No localized areas of tenderness about the metacarpal phalangeal joint or the inner phalangeal joints.  Able to make  a full fist and release.  Specialty Comments:  No specialty comments available.  Imaging: No results found.   PMFS History: Patient Active Problem List   Diagnosis Date Noted  . Pulmonary nodules 12/08/2015  . 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 Sleep apnea 04/06/2015  . Pulmonary emphysema ([place]) 03/02/2015  . Smoker 02/16/2015  . Seizure disorder ([place]) 01/21/2015  . Focal epilepsy with impairment of consciousness ([place]) 12/13/2014  . Seizures ([place]) 04/18/2014  . HTN (hypertension) 04/18/2014  . 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 Tobacco use disorder 07/19/2012   Past Medical History:  Diagnosis Date  . Asthma   . Bursitis of  left hip    right  . COPD (chronic obstructive pulmonary disease) ([place])   . GERD (gastroesophageal reflux disease)   . Headache(784.0)   . Hypertension   . Pneumonia   . Seizure ([place])    most recent 12/12/14  . Sleep apnea     Family History  Problem Relation Age of Onset  . Diabetes Father   . Hypertension Father   . Hyperlipidemia Father   . COPD Mother   . Diabetes Brother   . Stroke Paternal Grandmother   . Cancer Sister   . Cancer Paternal Uncle   . Colon cancer Neg Hx     Past Surgical History:  Procedure Laterality Date  . ABDOMINAL HYSTERECTOMY    . APPENDECTOMY    . CATARACT EXTRACTION Bilateral   . CHOLECYSTECTOMY    . heal spur     right  . LASER PHOTO ABLATION Right 08/27/2013   Procedure: LASER PHOTO ABLATION;  Surgeon: Hayden Pedro, MD;  Location: [place];  Service: Ophthalmology;  Laterality: Right;  Headscope laser AND Endolaser  . MEMBRANE PEEL Right 08/27/2013   Procedure: MEMBRANE PEEL;  Surgeon: Hayden Pedro, MD;  Location: [facility];  Service: Ophthalmology;  Laterality: Right;  . PARS PLANA VITRECTOMY Right 08/27/2013   Procedure: PARS PLANA VITRECTOMY WITH 25 GAUGE;  Surgeon: Hayden Pedro, MD;  Location: [facility];  Service: Ophthalmology;  Laterality: Right;   Social History   Occupational History  . Occupation: Airline pilot: [employer]  Tobacco Use  . Smoking status: Former Smoker    Packs/day: 0.25    Years: 42.00    Pack years: 10.50    Types: Cigarettes    Last attempt to quit: 03/20/2014    Years since quitting: 3.7  . Smokeless tobacco: Never Used  . Tobacco comment: 01/21/15 restarted smoking 4 mos ago  Substance and Sexual Activity  . Alcohol use: No    Alcohol/week: 0.0 standard drinks    Frequency: Never  . Drug use: No  . Sexual activity: Not Currently

## 2018-01-01 ENCOUNTER — Other Ambulatory Visit: Payer: Self-pay | Admitting: *Deleted

## 2018-01-01 MED ORDER — ALBUTEROL SULFATE HFA 108 (90 BASE) MCG/ACT IN AERS: INHALATION_SPRAY | RESPIRATORY_TRACT | 1 refills | Status: DC

## 2018-01-30 ENCOUNTER — Other Ambulatory Visit: Payer: Self-pay | Admitting: Family Medicine

## 2018-01-30 DIAGNOSIS — J440 Chronic obstructive pulmonary disease with acute lower respiratory infection: Secondary | ICD-10-CM

## 2018-01-30 DIAGNOSIS — J209 Acute bronchitis, unspecified: Secondary | ICD-10-CM

## 2018-02-08 ENCOUNTER — Encounter: Payer: Self-pay | Admitting: Pediatrics

## 2018-02-08 ENCOUNTER — Ambulatory Visit (INDEPENDENT_AMBULATORY_CARE_PROVIDER_SITE_OTHER): Payer: Medicare Other | Admitting: Pediatrics

## 2018-02-08 VITALS — BP 139/79 | HR 59 | Temp 96.9°F | Ht 67.0 in | Wt 239.0 lb

## 2018-02-08 DIAGNOSIS — H65113 Acute and subacute allergic otitis media (mucoid) (sanguinous) (serous), bilateral: Secondary | ICD-10-CM

## 2018-02-08 DIAGNOSIS — R059 Cough, unspecified: Secondary | ICD-10-CM

## 2018-02-08 DIAGNOSIS — R05 Cough: Secondary | ICD-10-CM | POA: Diagnosis not present

## 2018-02-08 DIAGNOSIS — R0989 Other specified symptoms and signs involving the circulatory and respiratory systems: Secondary | ICD-10-CM | POA: Diagnosis not present

## 2018-02-08 MED ORDER — GUAIFENESIN-CODEINE 100-10 MG/5ML PO SOLN: 5.0000 mL | Freq: Three times a day (TID) | ORAL | 0 refills | Status: DC | PRN

## 2018-02-08 MED ORDER — AZITHROMYCIN 250 MG PO TABS: ORAL_TABLET | ORAL | 0 refills | Status: DC

## 2018-02-08 NOTE — Progress Notes (Signed)
  Subjective:   Patient ID: Courtney Mcconnell, female    DOB: 02/09/1956, 62 y.o.   MRN: [id number] CC: Nasal Congestion and Cough  HPI: Courtney Mcconnell is a 62 y.o. female   Symptoms started two days ago. Ears bothering her the most, having pain. Appetite down. Coughing some. No SOB, doesnt think she has had fevers. Cough keeping her awake at night, coughing day and night. Mostly dry. Sometimes productive. Some nasal congestion and sinus drainage.  Relevant past medical, surgical, family and social history reviewed. Allergies and medications reviewed and updated. Social History   Tobacco Use  Smoking Status Former Smoker  . Packs/day: 0.25  . Years: 42.00  . Pack years: 10.50  . Types: Cigarettes  . Last attempt to quit: 03/20/2014  . Years since quitting: 3.8  Smokeless Tobacco Never Used  Tobacco Comment   01/21/15 restarted smoking 4 mos ago   ROS: Per HPI   Objective:    BP 139/79   Pulse (!) 59   Temp (!) 96.9 F (36.1 C) (Oral)   Ht 5\' 7"  (1.702 m)   Wt 239 lb (108.4 kg)   BMI 37.43 kg/m   Wt Readings from Last 3 Encounters:  02/08/18 239 lb (108.4 kg)  12/25/17 244 lb (110.7 kg)  12/13/17 244 lb (110.7 kg)    Gen: NAD, alert, cooperative with exam, NCAT EYES: EOMI, no conjunctival injection, or no icterus ENT:  TMs red b/l with effusion, OP without erythema LYMPH: no cervical LAD CV: NRRR, normal S1/S2, no murmur, distal pulses 2+ b/l Resp: CTABL, no wheezes, normal WOB Ext: No edema, warm Neuro: Alert and oriented MSK: normal muscle bulk  Assessment & Plan:  Courtney Mcconnell was seen today for nasal congestion and cough.  Diagnoses and all orders for this visit:  Chest congestion Flu test negative -     Veritor Flu A/B Waived  Cough Use below as needed, can cause drowsiness, do not take and drive -     guaiFENesin-codeine 100-10 MG/5ML syrup; Take 5-10 mLs by mouth 3 (three) times daily as needed for cough.  Acute mucoid otitis media of both ears Symptom care,  return precautions discussed. -     azithromycin (ZITHROMAX) 250 MG tablet; Take 2 the first day and then one each day after.   Follow up plan: Return if symptoms worsen or fail to improve. Assunta Found, MD [place]

## 2018-02-10 LAB — VERITOR FLU A/B WAIVED
INFLUENZA B: NEGATIVE
Influenza A: NEGATIVE

## 2018-02-12 ENCOUNTER — Encounter (HOSPITAL_COMMUNITY): Payer: Self-pay

## 2018-02-12 ENCOUNTER — Emergency Department (HOSPITAL_COMMUNITY): Payer: Medicare Other

## 2018-02-12 ENCOUNTER — Emergency Department (HOSPITAL_COMMUNITY)
Admission: EM | Admit: 2018-02-12 | Discharge: 2018-02-12 | Disposition: A | Discharge status: Home / Self Care | Payer: Medicare Other | Attending: Emergency Medicine | Admitting: Emergency Medicine

## 2018-02-12 ENCOUNTER — Other Ambulatory Visit: Payer: Self-pay

## 2018-02-12 DIAGNOSIS — I10 Essential (primary) hypertension: Secondary | ICD-10-CM | POA: Diagnosis not present

## 2018-02-12 DIAGNOSIS — R569 Unspecified convulsions: Secondary | ICD-10-CM | POA: Insufficient documentation

## 2018-02-12 DIAGNOSIS — R410 Disorientation, unspecified: Secondary | ICD-10-CM | POA: Diagnosis not present

## 2018-02-12 DIAGNOSIS — G40909 Epilepsy, unspecified, not intractable, without status epilepticus: Secondary | ICD-10-CM | POA: Diagnosis not present

## 2018-02-12 DIAGNOSIS — R51 Headache: Secondary | ICD-10-CM | POA: Diagnosis not present

## 2018-02-12 DIAGNOSIS — J449 Chronic obstructive pulmonary disease, unspecified: Secondary | ICD-10-CM | POA: Diagnosis not present

## 2018-02-12 DIAGNOSIS — Z79899 Other long term (current) drug therapy: Secondary | ICD-10-CM | POA: Diagnosis not present

## 2018-02-12 DIAGNOSIS — Z87891 Personal history of nicotine dependence: Secondary | ICD-10-CM | POA: Insufficient documentation

## 2018-02-12 LAB — CBG MONITORING, ED: GLUCOSE-CAPILLARY: 129 mg/dL — AB (ref 70–99)

## 2018-02-12 LAB — CBC WITH DIFFERENTIAL/PLATELET
Abs Immature Granulocytes: 0.04 10*3/uL (ref 0.00–0.07)
BASOS ABS: 0 10*3/uL (ref 0.0–0.1)
BASOS PCT: 0 %
EOS ABS: 0.1 10*3/uL (ref 0.0–0.5)
Eosinophils Relative: 0 %
HCT: 44.6 % (ref 36.0–46.0)
Hemoglobin: 14.3 g/dL (ref 12.0–15.0)
IMMATURE GRANULOCYTES: 0 %
Lymphocytes Relative: 13 %
Lymphs Abs: 1.7 10*3/uL (ref 0.7–4.0)
MCH: 31.1 pg (ref 26.0–34.0)
MCHC: 32.1 g/dL (ref 30.0–36.0)
MCV: 97 fL (ref 80.0–100.0)
MONOS PCT: 7 %
Monocytes Absolute: 0.9 10*3/uL (ref 0.1–1.0)
NRBC: 0 % (ref 0.0–0.2)
Neutro Abs: 10.1 10*3/uL — ABNORMAL HIGH (ref 1.7–7.7)
Neutrophils Relative %: 80 %
PLATELETS: 251 10*3/uL (ref 150–400)
RBC: 4.6 MIL/uL (ref 3.87–5.11)
RDW: 13.3 % (ref 11.5–15.5)
WBC: 12.8 10*3/uL — ABNORMAL HIGH (ref 4.0–10.5)

## 2018-02-12 LAB — BASIC METABOLIC PANEL
Anion gap: 5 (ref 5–15)
BUN: 12 mg/dL (ref 8–23)
CALCIUM: 8.7 mg/dL — AB (ref 8.9–10.3)
CO2: 24 mmol/L (ref 22–32)
Chloride: 104 mmol/L (ref 98–111)
Creatinine, Ser: 0.86 mg/dL (ref 0.44–1.00)
Glucose, Bld: 142 mg/dL — ABNORMAL HIGH (ref 70–99)
Potassium: 3.9 mmol/L (ref 3.5–5.1)
Sodium: 133 mmol/L — ABNORMAL LOW (ref 135–145)

## 2018-02-12 LAB — MAGNESIUM: Magnesium: 2.2 mg/dL (ref 1.7–2.4)

## 2018-02-12 MED ORDER — ONDANSETRON HCL 4 MG/2ML IJ SOLN
4.0000 mg | Freq: Once | INTRAMUSCULAR | Status: AC
Start: 2018-02-12 — End: 2018-02-12
  Administered 2018-02-12: 4 mg via INTRAVENOUS
  Filled 2018-02-12: qty 2

## 2018-02-12 MED ORDER — FENTANYL CITRATE (PF) 100 MCG/2ML IJ SOLN
25.0000 ug | Freq: Once | INTRAMUSCULAR | Status: AC
  Administered 2018-02-12: 25 ug via INTRAVENOUS
  Filled 2018-02-12: qty 2

## 2018-02-12 MED ORDER — LORAZEPAM 2 MG/ML IJ SOLN
1.0000 mg | Freq: Once | INTRAMUSCULAR | Status: AC
  Administered 2018-02-12: 1 mg via INTRAVENOUS
  Filled 2018-02-12: qty 1

## 2018-02-12 NOTE — Discharge Instructions (Addendum)
Head CT and labs here today without significant abnormalities.  Follow-up with your neurology group in [place] give him a call tomorrow and let them know about the recurrent seizures today.  Return for any new or worse symptoms.  Continue your medications as directed.  Copy of your CT and lab results provided for you to take to your neurologist.

## 2018-02-12 NOTE — ED Provider Notes (Signed)
[facility] EMERGENCY DEPARTMENT Provider Note   CSN: [id number] Arrival date & time: 02/12/18  1028     History   Chief Complaint Chief Complaint  Patient presents with  . Seizures    HPI Courtney Mcconnell is a 63 y.o. female.  Patient with known history of generalized and partial complex seizures followed by neurology in the [place] area.  Patient's daughter states that she normally has seizures about every 3 months.  She has had 4-5 seizures since midnight.  Each 1 about an hour apart.  Presents here postictal very sleepy.  Daughter states that the seizures witnessed by them were typical for her.  Patient was feeling fine yesterday.  She also has a history according to notes of sleep apnea but does not use CPAP.  Patient's been taking her antiseizure medicines as directed and she actually did have her morning dose of seizure medicine today.  No new medications.  Patient is on Vimpat and Zonegran.  No evidence of her wetting herself or biting her tongue.     Past Medical History:  Diagnosis Date  . Asthma   . Bursitis of left hip    right  . COPD (chronic obstructive pulmonary disease) ([place])   . GERD (gastroesophageal reflux disease)   . Headache(784.0)   . Hypertension   . Pneumonia   . Seizure ([place])    most recent 12/12/14  . Sleep apnea     Patient Active Problem List   Diagnosis Date Noted  . Pulmonary nodules 12/08/2015  . 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 Sleep apnea 04/06/2015  . Pulmonary emphysema ([place]) 03/02/2015  . Smoker 02/16/2015  . Seizure disorder ([place]) 01/21/2015  . Focal epilepsy with impairment of consciousness ([place]) 12/13/2014  . Seizures ([place]) 04/18/2014  . HTN (hypertension) 04/18/2014  . 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 Tobacco use disorder 07/19/2012    Past Surgical History:  Procedure Laterality Date  . ABDOMINAL HYSTERECTOMY    . APPENDECTOMY    . CATARACT EXTRACTION Bilateral   . CHOLECYSTECTOMY    . heal  spur     right  . LASER PHOTO ABLATION Right 08/27/2013   Procedure: LASER PHOTO ABLATION;  Surgeon: Hayden Pedro, MD;  Location: [place];  Service: Ophthalmology;  Laterality: Right;  Headscope laser AND Endolaser  . MEMBRANE PEEL Right 08/27/2013   Procedure: MEMBRANE PEEL;  Surgeon: Hayden Pedro, MD;  Location: [place];  Service: Ophthalmology;  Laterality: Right;  . PARS PLANA VITRECTOMY Right 08/27/2013   Procedure: PARS PLANA VITRECTOMY WITH 25 GAUGE;  Surgeon: Hayden Pedro, MD;  Location: [place];  Service: Ophthalmology;  Laterality: Right;     OB History    Gravida  2   Para  2   Term  2   Preterm      AB      Living  3     SAB      TAB      Ectopic      Multiple      Live Births               Home Medications    Prior to Admission medications   Medication Sig Start Date End Date Taking? Authorizing Provider  albuterol (PROAIR HFA) 108 (90 Base) MCG/ACT inhaler TAKE TWO PUFFS EVERY 6 HOURS AS NEEDED FOR WHEEZE OR SHORTNESS OF BREATH 01/01/18  Yes Dettinger, Fransisca Kaufmann, MD  azithromycin (ZITHROMAX) 250 MG tablet Take 2 the  first day and then one each day after. 02/08/18  Yes Eustaquio Maize, MD  cetirizine (ZYRTEC) 10 MG tablet TAKE 1 TABLET (10 MG TOTAL) BY MOUTH DAILY. 06/03/17  Yes Dettinger, Fransisca Kaufmann, MD  fluticasone (FLONASE) 50 MCG/ACT nasal spray PLACE 2 SPRAYS INTO BOTH NOSTRILS DAILY. 12/30/15  Yes Dettinger, Fransisca Kaufmann, MD  guaiFENesin-codeine 100-10 MG/5ML syrup Take 5-10 mLs by mouth 3 (three) times daily as needed for cough. 02/08/18  Yes Eustaquio Maize, MD  lacosamide (VIMPAT) 200 MG TABS tablet Take 1 tablet (200 mg total) by mouth 2 (two) times daily. 01/21/15  Yes Penumalli, Earlean Polka, MD  nitroGLYCERIN (NITROSTAT) 0.4 MG SL tablet Place 1 tablet (0.4 mg total) under the tongue every 5 (five) minutes as needed for chest pain. 11/20/17  Yes Terald Sleeper, PA-C  omeprazole (PRILOSEC) 20 MG capsule Take 20 mg by mouth daily.   Yes [provider]  tiotropium (SPIRIVA HANDIHALER) 18 MCG inhalation capsule inhale the contents of one capsule in the handihaler once daily 01/31/18  Yes Dettinger, Fransisca Kaufmann, MD  zonisamide (ZONEGRAN) 100 MG capsule Take 200 mg by mouth at bedtime. 06/13/17  Yes [provider]  Spacer/Aero Chamber Mouthpiece MISC 1 each by Does not apply route every 6 (six) hours as needed. 04/20/16   Eustaquio Maize, MD    Family History Family History  Problem Relation Age of Onset  . Diabetes Father   . Hypertension Father   . Hyperlipidemia Father   . COPD Mother   . Diabetes Brother   . Stroke Paternal Grandmother   . Cancer Sister   . Cancer Paternal Uncle   . Colon cancer Neg Hx     Social History Social History   Tobacco Use  . Smoking status: Former Smoker    Packs/day: 0.25    Years: 42.00    Pack years: 10.50    Types: Cigarettes    Last attempt to quit: 03/20/2014    Years since quitting: 3.9  . Smokeless tobacco: Never Used  . Tobacco comment: 01/21/15 restarted smoking 4 mos ago  Substance Use Topics  . Alcohol use: No    Alcohol/week: 0.0 standard drinks    Frequency: Never  . Drug use: No     Allergies   Bee venom; Amoxicillin-pot clavulanate; and Doxycycline hyclate   Review of Systems Review of Systems  Unable to perform ROS: Mental status change  Neurological: Positive for seizures.  Psychiatric/Behavioral: Positive for confusion.     Physical Exam Updated Vital Signs BP 131/85 (BP Location: Right Arm)   Pulse 80   Temp 98.2 F (36.8 C) (Oral)   Resp 19   Ht 1.702 m (5\' 7" )   Wt 108 kg   SpO2 94%   BMI 37.29 kg/m   Physical Exam Vitals signs and nursing note reviewed.  Constitutional:      Appearance: Normal appearance.  HENT:     Head: Normocephalic and atraumatic.     Mouth/Throat:     Mouth: Mucous membranes are moist.     Comments: Oropharynx clear Eyes:     Extraocular Movements: Extraocular movements intact.      Conjunctiva/sclera: Conjunctivae normal.     Pupils: Pupils are equal, round, and reactive to light.  Neck:     Musculoskeletal: Normal range of motion and neck supple.  Cardiovascular:     Rate and Rhythm: Normal rate and regular rhythm.  Pulmonary:     Effort: Pulmonary effort is normal.  No respiratory distress.     Breath sounds: Normal breath sounds.  Abdominal:     General: Abdomen is flat. Bowel sounds are normal.     Palpations: Abdomen is soft.     Tenderness: There is no abdominal tenderness.  Musculoskeletal: Normal range of motion.        General: No swelling or deformity.  Skin:    Capillary Refill: Capillary refill takes less than 2 seconds.  Neurological:     Mental Status: She is alert.     Comments: Patient very sleepy      ED Treatments / Results  Labs (all labs ordered are listed, but only abnormal results are displayed) Labs Reviewed  BASIC METABOLIC PANEL - Abnormal; Notable for the following components:      Result Value   Sodium 133 (*)    Glucose, Bld 142 (*)    Calcium 8.7 (*)    All other components within normal limits  CBC WITH DIFFERENTIAL/PLATELET - Abnormal; Notable for the following components:   WBC 12.8 (*)    Neutro Abs 10.1 (*)    All other components within normal limits  CBG MONITORING, ED - Abnormal; Notable for the following components:   Glucose-Capillary 129 (*)    All other components within normal limits  MAGNESIUM    EKG EKG Interpretation  Date/Time:  Wednesday February 12 2018 10:34:00 EST Ventricular Rate:  83 PR Interval:    QRS Duration: 103 QT Interval:  377 QTC Calculation: 443 R Axis:   -36 Text Interpretation:  Sinus rhythm Left axis deviation RSR' in V1 or V2, right VCD or RVH Baseline wander in lead(s) V6 No significant change since last tracing Confirmed by Fredia Sorrow [phone]) on 02/12/2018 11:21:18 AM   Radiology Ct Head Wo Contrast  Result Date: 02/12/2018 CLINICAL DATA:  Patient with multiple  seizures.  Headache. EXAM: CT HEAD WITHOUT CONTRAST TECHNIQUE: Contiguous axial images were obtained from the base of the skull through the vertex without intravenous contrast. COMPARISON:  Brain CT 04/18/2014 FINDINGS: Brain: Ventricles and sulci are appropriate for patient's age. No evidence for acute cortically based infarct, intracranial hemorrhage, mass lesion or mass-effect. Old left anterior caudate lacunar infarct. Vascular: Unremarkable Skull: Intact. Sinuses/Orbits: Paranasal sinuses are well aerated. Mastoid air cells are unremarkable. Orbits are unremarkable. Other: None. IMPRESSION: No acute intracranial process. Electronically Signed   By: Lovey Newcomer M.D.   On: 02/12/2018 12:30    Procedures Procedures (including critical care time)  Medications Ordered in ED Medications  LORazepam (ATIVAN) injection 1 mg (1 mg Intravenous Given 02/12/18 1143)  fentaNYL (SUBLIMAZE) injection 25 mcg (25 mcg Intravenous Given 02/12/18 1203)  ondansetron (ZOFRAN) injection 4 mg (4 mg Intravenous Given 02/12/18 1203)     Initial Impression / Assessment and Plan / ED Course  I have reviewed the triage vital signs and the nursing notes.  Pertinent labs & imaging results that were available during my care of the patient were reviewed by me and considered in my medical decision making (see chart for details).    Patient with known seizure disorder.  Typical seizures for her but more frequent than usual.  Work-up here including head CT labs without significant abnormalities.  Blood sugar was fine.  No evidence of any biting of her tongue or incontinence.  Patient remained postictal for a period of time and was observed and then she woke up and had several Sprite to drink and was back to baseline as per her daughter.  They  will continue her's antiseizure medicines as directed by her neurologist and they will contact her neurologist tomorrow in [place] area for follow-up.  The return for any new or worse symptoms.   Today's seizures seem to be consistent with her past seizure disorder it is concerning that there was several of them from midnight on.   Final Clinical Impressions(s) / ED Diagnoses   Final diagnoses:  Seizure [facility])    ED Discharge Orders    None       Fredia Sorrow, MD 02/12/18 [phone]

## 2018-02-12 NOTE — ED Triage Notes (Signed)
Pt's son reports pt has had multiple seizures since midnight last night.  Reports lasts approx 5 min.  PT presently alert and oriented but slow to answer.  Pt denies any pain.

## 2018-03-10 ENCOUNTER — Encounter (HOSPITAL_COMMUNITY): Payer: Self-pay | Admitting: Emergency Medicine

## 2018-03-10 ENCOUNTER — Other Ambulatory Visit: Payer: Self-pay

## 2018-03-10 ENCOUNTER — Emergency Department (HOSPITAL_COMMUNITY): Payer: Medicare Other

## 2018-03-10 ENCOUNTER — Emergency Department (HOSPITAL_COMMUNITY)
Admission: EM | Admit: 2018-03-10 | Discharge: 2018-03-10 | Disposition: A | Discharge status: Home / Self Care | Payer: Medicare Other | Attending: Emergency Medicine | Admitting: Emergency Medicine

## 2018-03-10 DIAGNOSIS — Z79899 Other long term (current) drug therapy: Secondary | ICD-10-CM | POA: Diagnosis not present

## 2018-03-10 DIAGNOSIS — Z87891 Personal history of nicotine dependence: Secondary | ICD-10-CM | POA: Insufficient documentation

## 2018-03-10 DIAGNOSIS — R1010 Upper abdominal pain, unspecified: Secondary | ICD-10-CM | POA: Insufficient documentation

## 2018-03-10 DIAGNOSIS — R0602 Shortness of breath: Secondary | ICD-10-CM | POA: Diagnosis not present

## 2018-03-10 DIAGNOSIS — I10 Essential (primary) hypertension: Secondary | ICD-10-CM | POA: Diagnosis not present

## 2018-03-10 DIAGNOSIS — J449 Chronic obstructive pulmonary disease, unspecified: Secondary | ICD-10-CM | POA: Insufficient documentation

## 2018-03-10 DIAGNOSIS — I4510 Unspecified right bundle-branch block: Secondary | ICD-10-CM | POA: Diagnosis not present

## 2018-03-10 DIAGNOSIS — R079 Chest pain, unspecified: Secondary | ICD-10-CM | POA: Diagnosis not present

## 2018-03-10 LAB — CBC
HCT: 42.5 % (ref 36.0–46.0)
Hemoglobin: 13.6 g/dL (ref 12.0–15.0)
MCH: 31.4 pg (ref 26.0–34.0)
MCHC: 32 g/dL (ref 30.0–36.0)
MCV: 98.2 fL (ref 80.0–100.0)
PLATELETS: 257 10*3/uL (ref 150–400)
RBC: 4.33 MIL/uL (ref 3.87–5.11)
RDW: 13.2 % (ref 11.5–15.5)
WBC: 8.7 10*3/uL (ref 4.0–10.5)
nRBC: 0 % (ref 0.0–0.2)

## 2018-03-10 LAB — BASIC METABOLIC PANEL
Anion gap: 8 (ref 5–15)
BUN: 7 mg/dL — ABNORMAL LOW (ref 8–23)
CALCIUM: 9.2 mg/dL (ref 8.9–10.3)
CO2: 24 mmol/L (ref 22–32)
Chloride: 106 mmol/L (ref 98–111)
Creatinine, Ser: 0.91 mg/dL (ref 0.44–1.00)
GFR calc Af Amer: >60 mL/min (ref >60)
GFR calc non Af Amer: >60 mL/min (ref >60)
Glucose, Bld: 133 mg/dL — ABNORMAL HIGH (ref 70–99)
Potassium: 3.6 mmol/L (ref 3.5–5.1)
Sodium: 138 mmol/L (ref 135–145)

## 2018-03-10 LAB — TROPONIN I: Troponin I: <0.03 ng/mL (ref <0.03)

## 2018-03-10 MED ORDER — SODIUM CHLORIDE 0.9% FLUSH: 3.0000 mL | Freq: Once | INTRAVENOUS | Status: DC

## 2018-03-10 NOTE — ED Notes (Signed)
Pt did not wait for discharge paperwork or instructions before leaving and did not tell staff they were leaving.

## 2018-03-10 NOTE — Discharge Instructions (Addendum)
Increase your Prilosec so you are taking it twice a day and follow-up with your family doctor this week for recheck

## 2018-03-10 NOTE — ED Provider Notes (Signed)
[facility] EMERGENCY DEPARTMENT Provider Note   CSN: [id number] Arrival date & time: 03/10/18  1818     History   Chief Complaint Chief Complaint  Patient presents with  . Chest Pain    HPI Courtney Mcconnell is a 63 y.o. female.  States that she was eating something today and started having some epigastric pain.  She stated it was severe and lasted for over an hour.  She is no longer having any pain now.  Patient has had her gallbladder out and her appendix  The history is provided by the patient. No language interpreter was used.  Abdominal Pain  Pain location:  Epigastric Pain quality: aching   Pain radiates to:  Does not radiate Pain severity:  Moderate Onset quality:  Sudden Timing:  Constant Progression:  Resolved Chronicity:  New Context: not alcohol use   Relieved by:  Nothing Worsened by:  Nothing Ineffective treatments:  None tried Associated symptoms: no chest pain, no cough, no diarrhea, no fatigue and no hematuria     Past Medical History:  Diagnosis Date  . Asthma   . Bursitis of left hip    right  . COPD (chronic obstructive pulmonary disease) ([place])   . GERD (gastroesophageal reflux disease)   . Headache(784.0)   . Hypertension   . Pneumonia   . Seizure ([place])    most recent 12/12/14  . Sleep apnea     Patient Active Problem List   Diagnosis Date Noted  . Pulmonary nodules 12/08/2015  . 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 Sleep apnea 04/06/2015  . Pulmonary emphysema ([place]) 03/02/2015  . Smoker 02/16/2015  . Seizure disorder ([place]) 01/21/2015  . Focal epilepsy with impairment of consciousness ([place]) 12/13/2014  . Seizures ([place]) 04/18/2014  . HTN (hypertension) 04/18/2014  . 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 Tobacco use disorder 07/19/2012    Past Surgical History:  Procedure Laterality Date  . ABDOMINAL HYSTERECTOMY    . APPENDECTOMY    . CATARACT EXTRACTION Bilateral   . CHOLECYSTECTOMY    . heal spur     right    . LASER PHOTO ABLATION Right 08/27/2013   Procedure: LASER PHOTO ABLATION;  Surgeon: Hayden Pedro, MD;  Location: [place];  Service: Ophthalmology;  Laterality: Right;  Headscope laser AND Endolaser  . MEMBRANE PEEL Right 08/27/2013   Procedure: MEMBRANE PEEL;  Surgeon: Hayden Pedro, MD;  Location: [place];  Service: Ophthalmology;  Laterality: Right;  . PARS PLANA VITRECTOMY Right 08/27/2013   Procedure: PARS PLANA VITRECTOMY WITH 25 GAUGE;  Surgeon: Hayden Pedro, MD;  Location: [place];  Service: Ophthalmology;  Laterality: Right;     OB History    Gravida  2   Para  2   Term  2   Preterm      AB      Living  3     SAB      TAB      Ectopic      Multiple      Live Births               Home Medications    Prior to Admission medications   Medication Sig Start Date End Date Taking? Authorizing Provider  albuterol (PROAIR HFA) 108 (90 Base) MCG/ACT inhaler TAKE TWO PUFFS EVERY 6 HOURS AS NEEDED FOR WHEEZE OR SHORTNESS OF BREATH 01/01/18  Yes Dettinger, Fransisca Kaufmann, MD  fluticasone (FLONASE) 50 MCG/ACT nasal spray PLACE 2 SPRAYS  INTO BOTH NOSTRILS DAILY. Patient taking differently: Place 2 sprays into both nostrils at bedtime.  12/30/15  Yes Dettinger, Fransisca Kaufmann, MD  nitroGLYCERIN (NITROSTAT) 0.4 MG SL tablet Place 1 tablet (0.4 mg total) under the tongue every 5 (five) minutes as needed for chest pain. 11/20/17  Yes Terald Sleeper, PA-C  omeprazole (PRILOSEC) 20 MG capsule Take 20 mg by mouth daily after supper.    Yes [provider]  tiotropium (SPIRIVA HANDIHALER) 18 MCG inhalation capsule inhale the contents of one capsule in the handihaler once daily Patient taking differently: Place 18 mcg into inhaler and inhale every morning.  01/31/18  Yes Dettinger, Fransisca Kaufmann, MD  VIMPAT 100 MG TABS Take 300 mg by mouth 2 (two) times daily. 03/03/18  Yes [provider]  zonisamide (ZONEGRAN) 100 MG capsule Take 100 mg by mouth 2 (two) times daily.  06/13/17  Yes  [provider]  azithromycin (ZITHROMAX) 250 MG tablet Take 2 the first day and then one each day after. Patient not taking: Reported on 03/10/2018 02/08/18   Eustaquio Maize, MD  Spacer/Aero Chamber Mouthpiece MISC 1 each by Does not apply route every 6 (six) hours as needed. 04/20/16   Eustaquio Maize, MD    Family History Family History  Problem Relation Age of Onset  . Diabetes Father   . Hypertension Father   . Hyperlipidemia Father   . COPD Mother   . Diabetes Brother   . Stroke Paternal Grandmother   . Cancer Sister   . Cancer Paternal Uncle   . Colon cancer Neg Hx     Social History Social History   Tobacco Use  . Smoking status: Former Smoker    Packs/day: 0.25    Years: 42.00    Pack years: 10.50    Types: Cigarettes    Last attempt to quit: 03/20/2014    Years since quitting: 3.9  . Smokeless tobacco: Never Used  . Tobacco comment: 01/21/15 restarted smoking 4 mos ago  Substance Use Topics  . Alcohol use: No    Alcohol/week: 0.0 standard drinks    Frequency: Never  . Drug use: No     Allergies   Bee venom; Amoxicillin-pot clavulanate; and Doxycycline hyclate   Review of Systems Review of Systems  Constitutional: Negative for appetite change and fatigue.  HENT: Negative for congestion, ear discharge and sinus pressure.   Eyes: Negative for discharge.  Respiratory: Negative for cough.   Cardiovascular: Negative for chest pain.  Gastrointestinal: Positive for abdominal pain. Negative for diarrhea.  Genitourinary: Negative for frequency and hematuria.  Musculoskeletal: Negative for back pain.  Skin: Negative for rash.  Neurological: Negative for seizures and headaches.  Psychiatric/Behavioral: Negative for hallucinations.     Physical Exam Updated Vital Signs BP 139/83   Pulse 65   Resp 16   Ht 5\' 7"  (1.702 m)   Wt 99.8 kg   SpO2 98%   BMI 34.46 kg/m   Physical Exam Vitals signs and nursing note reviewed.  Constitutional:       Appearance: She is well-developed.  HENT:     Head: Normocephalic.     Nose: Nose normal.  Eyes:     General: No scleral icterus.    Conjunctiva/sclera: Conjunctivae normal.  Neck:     Musculoskeletal: Neck supple.     Thyroid: No thyromegaly.  Cardiovascular:     Rate and Rhythm: Normal rate and regular rhythm.     Heart sounds: No murmur. No friction  rub. No gallop.   Pulmonary:     Breath sounds: No stridor. No wheezing or rales.  Chest:     Chest wall: No tenderness.  Abdominal:     General: There is no distension.     Tenderness: There is abdominal tenderness. There is no rebound.  Musculoskeletal: Normal range of motion.  Lymphadenopathy:     Cervical: No cervical adenopathy.  Skin:    Findings: No erythema or rash.  Neurological:     Mental Status: She is oriented to person, place, and time.     Motor: No abnormal muscle tone.     Coordination: Coordination normal.  Psychiatric:        Behavior: Behavior normal.      ED Treatments / Results  Labs (all labs ordered are listed, but only abnormal results are displayed) Labs Reviewed  BASIC METABOLIC PANEL - Abnormal; Notable for the following components:      Result Value   Glucose, Bld 133 (*)    BUN 7 (*)    All other components within normal limits  CBC  TROPONIN I    EKG EKG Interpretation  Date/Time:  Monday March 10 2018 18:24:11 EST Ventricular Rate:  72 PR Interval:  180 QRS Duration: 100 QT Interval:  414 QTC Calculation: 453 R Axis:   -21 Text Interpretation:  Normal sinus rhythm Incomplete right bundle branch block Moderate voltage criteria for LVH, may be normal variant Borderline ECG Confirmed by Milton Ferguson [phone]) on 03/10/2018 10:40:20 PM Also confirmed by Milton Ferguson [phone])  on 03/10/2018 10:55:22 PM   Radiology Dg Chest 2 View  Result Date: 03/10/2018 CLINICAL DATA:  Left chest pain and shortness of breath for 3 hours. Asthma. EXAM: CHEST - 2 VIEW COMPARISON:  08/31/2017  FINDINGS: The heart size and mediastinal contours are within normal limits. Both lungs are clear. The visualized skeletal structures are unremarkable. IMPRESSION: No active cardiopulmonary disease. Electronically Signed   By: Earle Gell M.D.   On: 03/10/2018 19:15    Procedures Procedures (including critical care time)  Medications Ordered in ED Medications  sodium chloride flush (NS) 0.9 % injection 3 mL (3 mLs Intravenous Not Given 03/10/18 2142)     Initial Impression / Assessment and Plan / ED Course  I have reviewed the triage vital signs and the nursing notes.  Pertinent labs & imaging results that were available during my care of the patient were reviewed by me and considered in my medical decision making (see chart for details).     Labs EKG and chest x-ray unremarkable including a normal troponin.  Patient no longer having symptoms.  Suspect pain was related to gastritis.  Patient will increase her Prilosec to twice a day and follow-up with PCP this week Final Clinical Impressions(s) / ED Diagnoses   Final diagnoses:  Pain of upper abdomen    ED Discharge Orders    None       Milton Ferguson, MD 03/10/18 2304

## 2018-03-10 NOTE — ED Notes (Signed)
ekg handed to Dr. McManus 

## 2018-03-10 NOTE — ED Triage Notes (Signed)
Patient reports centralized chest pain that started 2 hours ago. Patient states it hurts to breath at both upper costal regions. Patient denies cough. No fever. Patient reports nausea earlier today but not associated with the chest pain. Patient does report SOB but states she was already having problems with her COPD.

## 2018-03-11 DIAGNOSIS — Z881 Allergy status to other antibiotic agents status: Secondary | ICD-10-CM | POA: Diagnosis not present

## 2018-03-11 DIAGNOSIS — Z880 Allergy status to penicillin: Secondary | ICD-10-CM | POA: Diagnosis not present

## 2018-03-11 DIAGNOSIS — Z733 Stress, not elsewhere classified: Secondary | ICD-10-CM | POA: Diagnosis not present

## 2018-03-11 DIAGNOSIS — R569 Unspecified convulsions: Secondary | ICD-10-CM | POA: Diagnosis not present

## 2018-03-11 DIAGNOSIS — Z79899 Other long term (current) drug therapy: Secondary | ICD-10-CM | POA: Diagnosis not present

## 2018-04-09 ENCOUNTER — Ambulatory Visit (INDEPENDENT_AMBULATORY_CARE_PROVIDER_SITE_OTHER): Payer: Medicare Other | Admitting: Family Medicine

## 2018-04-09 ENCOUNTER — Encounter: Payer: Self-pay | Admitting: Family Medicine

## 2018-04-09 ENCOUNTER — Ambulatory Visit: Payer: Medicare Other | Admitting: Family Medicine

## 2018-04-09 VITALS — BP 162/76 | HR 65 | Temp 97.7°F | Ht 67.0 in | Wt 241.2 lb

## 2018-04-09 DIAGNOSIS — L03114 Cellulitis of left upper limb: Secondary | ICD-10-CM

## 2018-04-09 MED ORDER — CEFTRIAXONE SODIUM 1 G IJ SOLR
1.0000 g | Freq: Once | INTRAMUSCULAR | Status: AC
  Administered 2018-04-09: 1 g via INTRAMUSCULAR

## 2018-04-09 MED ORDER — CIPROFLOXACIN HCL 500 MG PO TABS: 500.0000 mg | ORAL_TABLET | Freq: Two times a day (BID) | ORAL | 0 refills | Status: DC

## 2018-04-09 NOTE — Progress Notes (Signed)
Chief Complaint  Patient presents with  . Cellulitis    HPI  Patient presents today for onset 2 mornings ago of a slightly erythematous scratch above the left antecubital region.  It had some mild burning to it.  She has noted for the day yesterday morning she woke up and the redness was becoming confluent up to the elbow.  Today it has enlarged even further it swollen and burning intensely.  She gets some relief by applying ice and skin so soft lotion.  No known injury. PMH: Smoking status noted ROS: Per HPI  Objective: BP (!) 162/76   Pulse 65   Temp 97.7 F (36.5 C) (Oral)   Ht 5\' 7"  (1.702 m)   Wt 241 lb 4 oz (109.4 kg)   BMI 37.79 kg/m  Gen: NAD, alert, cooperative with exam HEENT: NCAT, EOMI, PERRL CV: RRR, good S1/S2, no murmur Resp: CTABL, no wheezes, non-labored Ext: Erythema and edema to the left antecubital fossa region.  It is markedly tender to palpation.  See photo below. Neuro: Alert and oriented, No gross deficits    Assessment and plan:  1. Cellulitis of left upper extremity     Meds ordered this encounter  Medications  . cefTRIAXone (ROCEPHIN) injection 1 g  . ciprofloxacin (CIPRO) 500 MG tablet    Sig: Take 1 tablet (500 mg total) by mouth 2 (two) times daily.    Dispense:  14 tablet    Refill:  0    No orders of the defined types were placed in this encounter.   Follow up as needed.  Claretta Fraise, MD

## 2018-04-11 ENCOUNTER — Other Ambulatory Visit: Payer: Self-pay

## 2018-04-11 ENCOUNTER — Encounter (HOSPITAL_COMMUNITY): Payer: Self-pay | Admitting: *Deleted

## 2018-04-11 ENCOUNTER — Inpatient Hospital Stay (HOSPITAL_COMMUNITY)
Admission: EM | Admit: 2018-04-11 | Discharge: 2018-04-14 | DRG: 603 | Disposition: A | Discharge status: Home / Self Care | Payer: Medicare Other | Attending: Internal Medicine | Admitting: Internal Medicine

## 2018-04-11 DIAGNOSIS — L0390 Cellulitis, unspecified: Secondary | ICD-10-CM | POA: Diagnosis present

## 2018-04-11 DIAGNOSIS — J439 Emphysema, unspecified: Secondary | ICD-10-CM | POA: Diagnosis not present

## 2018-04-11 DIAGNOSIS — L209 Atopic dermatitis, unspecified: Secondary | ICD-10-CM | POA: Diagnosis not present

## 2018-04-11 DIAGNOSIS — Z6837 Body mass index (BMI) 37.0-37.9, adult: Secondary | ICD-10-CM

## 2018-04-11 DIAGNOSIS — I10 Essential (primary) hypertension: Secondary | ICD-10-CM | POA: Diagnosis present

## 2018-04-11 DIAGNOSIS — Z8249 Family history of ischemic heart disease and other diseases of the circulatory system: Secondary | ICD-10-CM

## 2018-04-11 DIAGNOSIS — K219 Gastro-esophageal reflux disease without esophagitis: Secondary | ICD-10-CM | POA: Diagnosis present

## 2018-04-11 DIAGNOSIS — G40909 Epilepsy, unspecified, not intractable, without status epilepticus: Secondary | ICD-10-CM | POA: Diagnosis not present

## 2018-04-11 DIAGNOSIS — Z833 Family history of diabetes mellitus: Secondary | ICD-10-CM

## 2018-04-11 DIAGNOSIS — Z825 Family history of asthma and other chronic lower respiratory diseases: Secondary | ICD-10-CM

## 2018-04-11 DIAGNOSIS — L03114 Cellulitis of left upper limb: Secondary | ICD-10-CM | POA: Diagnosis not present

## 2018-04-11 LAB — CBC WITH DIFFERENTIAL/PLATELET
Abs Immature Granulocytes: 0.02 10*3/uL (ref 0.00–0.07)
Basophils Absolute: 0 10*3/uL (ref 0.0–0.1)
Basophils Relative: 1 %
Eosinophils Absolute: 0.1 10*3/uL (ref 0.0–0.5)
Eosinophils Relative: 2 %
HEMATOCRIT: 42.8 % (ref 36.0–46.0)
Hemoglobin: 13.6 g/dL (ref 12.0–15.0)
Immature Granulocytes: 0 %
LYMPHS ABS: 2.8 10*3/uL (ref 0.7–4.0)
Lymphocytes Relative: 34 %
MCH: 31.3 pg (ref 26.0–34.0)
MCHC: 31.8 g/dL (ref 30.0–36.0)
MCV: 98.6 fL (ref 80.0–100.0)
Monocytes Absolute: 0.8 10*3/uL (ref 0.1–1.0)
Monocytes Relative: 9 %
NEUTROS ABS: 4.5 10*3/uL (ref 1.7–7.7)
Neutrophils Relative %: 54 %
Platelets: 242 10*3/uL (ref 150–400)
RBC: 4.34 MIL/uL (ref 3.87–5.11)
RDW: 13.3 % (ref 11.5–15.5)
WBC: 8.3 10*3/uL (ref 4.0–10.5)
nRBC: 0 % (ref 0.0–0.2)

## 2018-04-11 LAB — BASIC METABOLIC PANEL
ANION GAP: 8 (ref 5–15)
BUN: 12 mg/dL (ref 8–23)
CO2: 26 mmol/L (ref 22–32)
Calcium: 9 mg/dL (ref 8.9–10.3)
Chloride: 104 mmol/L (ref 98–111)
Creatinine, Ser: 0.94 mg/dL (ref 0.44–1.00)
GFR calc Af Amer: >60 mL/min (ref >60)
GFR calc non Af Amer: >60 mL/min (ref >60)
Glucose, Bld: 121 mg/dL — ABNORMAL HIGH (ref 70–99)
POTASSIUM: 3.6 mmol/L (ref 3.5–5.1)
Sodium: 138 mmol/L (ref 135–145)

## 2018-04-11 NOTE — ED Triage Notes (Signed)
Pt c/o redness, pain and swelling to left arm; pt was seen at her PCP and given antibiotics and an injection of rocephin; pt states the pain has gotten worse and the area is more red and now peeling; pt was told by her PCP if it did not get any better to come to ED

## 2018-04-12 ENCOUNTER — Other Ambulatory Visit: Payer: Self-pay

## 2018-04-12 DIAGNOSIS — J439 Emphysema, unspecified: Secondary | ICD-10-CM | POA: Diagnosis not present

## 2018-04-12 DIAGNOSIS — L03114 Cellulitis of left upper limb: Principal | ICD-10-CM

## 2018-04-12 DIAGNOSIS — Z6837 Body mass index (BMI) 37.0-37.9, adult: Secondary | ICD-10-CM | POA: Diagnosis not present

## 2018-04-12 DIAGNOSIS — Z8249 Family history of ischemic heart disease and other diseases of the circulatory system: Secondary | ICD-10-CM | POA: Diagnosis not present

## 2018-04-12 DIAGNOSIS — L209 Atopic dermatitis, unspecified: Secondary | ICD-10-CM | POA: Diagnosis present

## 2018-04-12 DIAGNOSIS — L0390 Cellulitis, unspecified: Secondary | ICD-10-CM | POA: Diagnosis present

## 2018-04-12 DIAGNOSIS — K219 Gastro-esophageal reflux disease without esophagitis: Secondary | ICD-10-CM | POA: Diagnosis present

## 2018-04-12 DIAGNOSIS — Z833 Family history of diabetes mellitus: Secondary | ICD-10-CM | POA: Diagnosis not present

## 2018-04-12 DIAGNOSIS — G40909 Epilepsy, unspecified, not intractable, without status epilepticus: Secondary | ICD-10-CM | POA: Diagnosis not present

## 2018-04-12 DIAGNOSIS — Z825 Family history of asthma and other chronic lower respiratory diseases: Secondary | ICD-10-CM | POA: Diagnosis not present

## 2018-04-12 DIAGNOSIS — I10 Essential (primary) hypertension: Secondary | ICD-10-CM | POA: Diagnosis present

## 2018-04-12 MED ORDER — ONDANSETRON HCL 4 MG/2ML IJ SOLN
4.0000 mg | Freq: Once | INTRAMUSCULAR | Status: AC
  Administered 2018-04-12: 4 mg via INTRAVENOUS
  Filled 2018-04-12: qty 2

## 2018-04-12 MED ORDER — ZONISAMIDE 100 MG PO CAPS
100.0000 mg | ORAL_CAPSULE | Freq: Two times a day (BID) | ORAL | Status: DC
  Administered 2018-04-12 – 2018-04-14 (×4): 100 mg via ORAL
  Filled 2018-04-12 (×15): qty 1

## 2018-04-12 MED ORDER — TIOTROPIUM BROMIDE MONOHYDRATE 18 MCG IN CAPS
18.0000 ug | ORAL_CAPSULE | Freq: Every morning | RESPIRATORY_TRACT | Status: DC
  Administered 2018-04-12 – 2018-04-14 (×3): 18 ug via RESPIRATORY_TRACT
  Filled 2018-04-12: qty 5

## 2018-04-12 MED ORDER — SODIUM CHLORIDE 0.9 % IV SOLN
250.0000 mL | INTRAVENOUS | Status: DC | PRN
  Administered 2018-04-13: 250 mL via INTRAVENOUS

## 2018-04-12 MED ORDER — ACETAMINOPHEN 650 MG RE SUPP: 650.0000 mg | Freq: Four times a day (QID) | RECTAL | Status: DC | PRN

## 2018-04-12 MED ORDER — CLINDAMYCIN PHOSPHATE 900 MG/50ML IV SOLN
900.0000 mg | Freq: Once | INTRAVENOUS | Status: AC
  Administered 2018-04-12: 900 mg via INTRAVENOUS
  Filled 2018-04-12: qty 50

## 2018-04-12 MED ORDER — ACETAMINOPHEN 325 MG PO TABS
650.0000 mg | ORAL_TABLET | Freq: Four times a day (QID) | ORAL | Status: DC | PRN
  Administered 2018-04-12: 650 mg via ORAL
  Filled 2018-04-12: qty 2

## 2018-04-12 MED ORDER — ENOXAPARIN SODIUM 40 MG/0.4ML ~~LOC~~ SOLN
40.0000 mg | SUBCUTANEOUS | Status: DC
  Administered 2018-04-12 – 2018-04-13 (×2): 40 mg via SUBCUTANEOUS
  Filled 2018-04-12 (×2): qty 0.4

## 2018-04-12 MED ORDER — PANTOPRAZOLE SODIUM 40 MG PO TBEC
40.0000 mg | DELAYED_RELEASE_TABLET | Freq: Every day | ORAL | Status: DC
  Administered 2018-04-12 – 2018-04-14 (×3): 40 mg via ORAL
  Filled 2018-04-12 (×4): qty 1

## 2018-04-12 MED ORDER — ALBUTEROL SULFATE (2.5 MG/3ML) 0.083% IN NEBU: 3.0000 mL | INHALATION_SOLUTION | Freq: Four times a day (QID) | RESPIRATORY_TRACT | Status: DC | PRN

## 2018-04-12 MED ORDER — FENTANYL CITRATE (PF) 100 MCG/2ML IJ SOLN
50.0000 ug | Freq: Once | INTRAMUSCULAR | Status: AC
  Administered 2018-04-12: 50 ug via INTRAVENOUS
  Filled 2018-04-12: qty 2

## 2018-04-12 MED ORDER — SODIUM CHLORIDE 0.9% FLUSH: 3.0000 mL | INTRAVENOUS | Status: DC | PRN

## 2018-04-12 MED ORDER — LACOSAMIDE 50 MG PO TABS
300.0000 mg | ORAL_TABLET | Freq: Two times a day (BID) | ORAL | Status: DC
  Administered 2018-04-12 – 2018-04-14 (×4): 300 mg via ORAL
  Filled 2018-04-12 (×4): qty 6

## 2018-04-12 MED ORDER — SODIUM CHLORIDE 0.9 % IV BOLUS
1000.0000 mL | Freq: Once | INTRAVENOUS | Status: AC
  Administered 2018-04-12: 1000 mL via INTRAVENOUS

## 2018-04-12 MED ORDER — SODIUM CHLORIDE 0.9% FLUSH
3.0000 mL | Freq: Two times a day (BID) | INTRAVENOUS | Status: DC
  Administered 2018-04-12 – 2018-04-13 (×3): 3 mL via INTRAVENOUS

## 2018-04-12 MED ORDER — CLINDAMYCIN PHOSPHATE 900 MG/50ML IV SOLN
900.0000 mg | Freq: Three times a day (TID) | INTRAVENOUS | Status: DC
  Administered 2018-04-12 – 2018-04-14 (×6): 900 mg via INTRAVENOUS
  Filled 2018-04-12 (×7): qty 50

## 2018-04-12 NOTE — Progress Notes (Signed)
Courtney Mcconnell is a 63 y.o. female with medical history significant of COPD, GERD, seizure disorder comes in with a worsening rash to her left arm.  Patient reports she woke up Monday morning with scratches on her arm progress to being very red she went to the doctor on Wednesday who gave her a shot of Rocephin and put on ciprofloxacin for cellulitis.  She is been on ciprofloxacin for 48 hours and the redness has gotten a little worse.  The redness is in her antecubital area on the left patient denies any recent venipunctures there.  She denies any cats.  She denies any nausea vomiting diarrhea she denies any fevers or chills.  Patient be referred for admission for cellulitis has failed outpatient treatment with 1 dose of Rocephin and ciprofloxacin orally.  04/12/18: Patient seen and examined at bedside.  Reports some tenderness at the site of her cellulitis.  Failed outpatient treatment for cellulitis with IM Rocephin and p.o. ciprofloxacin.  Continue IV clindamycin every 8 hours.  Restart seizure medications.  Reviewed vital signs, stable.  Reviewed labs, stable.  Please refer to H&P dictated by Dr. Shanon Brow on 04/12/2018 for further details of the assessment and plan.

## 2018-04-12 NOTE — ED Provider Notes (Signed)
[facility] EMERGENCY DEPARTMENT Provider Note   CSN: [id number] Arrival date & time: 04/11/18  2129  Time seen 11:50 PM  History   Chief Complaint Chief Complaint  Patient presents with  . Cellulitis    HPI Courtney Mcconnell is a 63 y.o. female.     HPI patient states she woke up on the 24th and had to small scratch marks on the antecubital area of her left arm.  She states it was nonpainful it was not itchy.  There was no known trauma.  She did not have blood drawn there.  She states by the evening she had some redness about half of the size that it is tonight.  She was seen on the 26th and diagnosed with a skin infection and got a shot, probably Rocephin, and then was started on Cipro the following day, 27th.  She has had 3 doses of the Cipro.  She states the last time she took this morning it made her heart race so she decided she was not going to take it anymore.  She denies fever, nausea, or vomiting.  She states her arm is getting more swollen and painful.  She denies any change in her medications.  She denies any known injury or trauma to the area.  She denies any problems like this in the past.  PCP Dettinger, Fransisca Kaufmann, MD   Past Medical History:  Diagnosis Date  . Asthma   . Bursitis of left hip    right  . COPD (chronic obstructive pulmonary disease) ([place])   . GERD (gastroesophageal reflux disease)   . Headache(784.0)   . Hypertension   . Pneumonia   . Seizure ([place])    most recent 12/12/14  . Sleep apnea     Patient Active Problem List   Diagnosis Date Noted  . Cellulitis 04/12/2018  . Pulmonary nodules 12/08/2015  . 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 Sleep apnea 04/06/2015  . Pulmonary emphysema ([place]) 03/02/2015  . Smoker 02/16/2015  . Seizure disorder ([place]) 01/21/2015  . Focal epilepsy with impairment of consciousness ([place]) 12/13/2014  . Seizures ([place]) 04/18/2014  . HTN (hypertension) 04/18/2014  . 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  Tobacco use disorder 07/19/2012    Past Surgical History:  Procedure Laterality Date  . ABDOMINAL HYSTERECTOMY    . APPENDECTOMY    . CATARACT EXTRACTION Bilateral   . CHOLECYSTECTOMY    . heal spur     right  . LASER PHOTO ABLATION Right 08/27/2013   Procedure: LASER PHOTO ABLATION;  Surgeon: Hayden Pedro, MD;  Location: [place];  Service: Ophthalmology;  Laterality: Right;  Headscope laser AND Endolaser  . MEMBRANE PEEL Right 08/27/2013   Procedure: MEMBRANE PEEL;  Surgeon: Hayden Pedro, MD;  Location: [place];  Service: Ophthalmology;  Laterality: Right;  . PARS PLANA VITRECTOMY Right 08/27/2013   Procedure: PARS PLANA VITRECTOMY WITH 25 GAUGE;  Surgeon: Hayden Pedro, MD;  Location: [facility];  Service: Ophthalmology;  Laterality: Right;     OB History    Gravida  2   Para  2   Term  2   Preterm      AB      Living  3     SAB      TAB      Ectopic      Multiple      Live Births  Home Medications    Prior to Admission medications   Medication Sig Start Date End Date Taking? Authorizing Provider  albuterol (PROAIR HFA) 108 (90 Base) MCG/ACT inhaler TAKE TWO PUFFS EVERY 6 HOURS AS NEEDED FOR WHEEZE OR SHORTNESS OF BREATH 01/01/18   Dettinger, Fransisca Kaufmann, MD  ciprofloxacin (CIPRO) 500 MG tablet Take 1 tablet (500 mg total) by mouth 2 (two) times daily. 04/09/18   Claretta Fraise, MD  fluticasone (FLONASE) 50 MCG/ACT nasal spray PLACE 2 SPRAYS INTO BOTH NOSTRILS DAILY. Patient taking differently: Place 2 sprays into both nostrils at bedtime.  12/30/15   Dettinger, Fransisca Kaufmann, MD  nitroGLYCERIN (NITROSTAT) 0.4 MG SL tablet Place 1 tablet (0.4 mg total) under the tongue every 5 (five) minutes as needed for chest pain. 11/20/17   Terald Sleeper, PA-C  omeprazole (PRILOSEC) 20 MG capsule Take 20 mg by mouth daily after supper.     [provider]  Spacer/Aero Chamber Mouthpiece MISC 1 each by Does not apply route every 6 (six) hours as needed. 04/20/16    Eustaquio Maize, MD  tiotropium (SPIRIVA HANDIHALER) 18 MCG inhalation capsule inhale the contents of one capsule in the handihaler once daily Patient taking differently: Place 18 mcg into inhaler and inhale every morning.  01/31/18   Dettinger, Fransisca Kaufmann, MD  VIMPAT 100 MG TABS Take 300 mg by mouth 2 (two) times daily. 03/03/18   [provider]  zonisamide (ZONEGRAN) 100 MG capsule Take 100 mg by mouth 2 (two) times daily.  06/13/17   [provider]    Family History Family History  Problem Relation Age of Onset  . Diabetes Father   . Hypertension Father   . Hyperlipidemia Father   . COPD Mother   . Diabetes Brother   . Stroke Paternal Grandmother   . Cancer Sister   . Cancer Paternal Uncle   . Colon cancer Neg Hx     Social History Social History   Tobacco Use  . Smoking status: Former Smoker    Packs/day: 0.25    Years: 42.00    Pack years: 10.50    Types: Cigarettes    Last attempt to quit: 03/20/2014    Years since quitting: 4.0  . Smokeless tobacco: Never Used  . Tobacco comment: 01/21/15 restarted smoking 4 mos ago  Substance Use Topics  . Alcohol use: No    Alcohol/week: 0.0 standard drinks    Frequency: Never  . Drug use: No  lives with son   Allergies   Bee venom; Amoxicillin-pot clavulanate; and Doxycycline hyclate   Review of Systems Review of Systems  All other systems reviewed and are negative.    Physical Exam Updated Vital Signs BP (!) 174/80 (BP Location: Right Arm)   Pulse 70   Temp 98.3 F (36.8 C) (Oral)   Resp 18   Ht 5\' 7"  (1.702 m)   Wt 109.4 kg   SpO2 100%   BMI 37.78 kg/m   Vital signs normal except for hypertension   Physical Exam Vitals signs and nursing note reviewed.  Constitutional:      Appearance: Normal appearance. She is obese.  HENT:     Head: Normocephalic and atraumatic.     Right Ear: External ear normal.     Left Ear: External ear normal.     Nose: Nose normal.  Eyes:     Extraocular  Movements: Extraocular movements intact.  Neck:     Musculoskeletal: Normal range of motion.  Cardiovascular:  Rate and Rhythm: Normal rate.  Pulmonary:     Effort: Pulmonary effort is normal. No respiratory distress.  Musculoskeletal:        General: Swelling and tenderness present.     Comments: Patient is noted to have diffuse swelling of her left arm.  She has intense redness with some satellite type lesions around the edges and since scaling of the skin in the left antecubital area that measures approximately 11.5 x 15 cm.  It is very tender to palpation.  Skin:    General: Skin is warm and dry.     Findings: Erythema present.  Neurological:     General: No focal deficit present.     Mental Status: She is alert and oriented to person, place, and time.     Cranial Nerves: No cranial nerve deficit.  Psychiatric:        Mood and Affect: Mood normal.        Behavior: Behavior normal.        Thought Content: Thought content normal.        ED Treatments / Results  Labs (all labs ordered are listed, but only abnormal results are displayed) Results for orders placed or performed during the hospital encounter of 04/11/18  CBC with Differential  Result Value Ref Range   WBC 8.3 4.0 - 10.5 K/uL   RBC 4.34 3.87 - 5.11 MIL/uL   Hemoglobin 13.6 12.0 - 15.0 g/dL   HCT 42.8 36.0 - 46.0 %   MCV 98.6 80.0 - 100.0 fL   MCH 31.3 26.0 - 34.0 pg   MCHC 31.8 30.0 - 36.0 g/dL   RDW 13.3 11.5 - 15.5 %   Platelets 242 150 - 400 K/uL   nRBC 0.0 0.0 - 0.2 %   Neutrophils Relative % 54 %   Neutro Abs 4.5 1.7 - 7.7 K/uL   Lymphocytes Relative 34 %   Lymphs Abs 2.8 0.7 - 4.0 K/uL   Monocytes Relative 9 %   Monocytes Absolute 0.8 0.1 - 1.0 K/uL   Eosinophils Relative 2 %   Eosinophils Absolute 0.1 0.0 - 0.5 K/uL   Basophils Relative 1 %   Basophils Absolute 0.0 0.0 - 0.1 K/uL   Immature Granulocytes 0 %   Abs Immature Granulocytes 0.02 0.00 - 0.07 K/uL  Basic metabolic panel  Result  Value Ref Range   Sodium 138 135 - 145 mmol/L   Potassium 3.6 3.5 - 5.1 mmol/L   Chloride 104 98 - 111 mmol/L   CO2 26 22 - 32 mmol/L   Glucose, Bld 121 (H) 70 - 99 mg/dL   BUN 12 8 - 23 mg/dL   Creatinine, Ser 0.94 0.44 - 1.00 mg/dL   Calcium 9.0 8.9 - 10.3 mg/dL   GFR calc non Af Amer >60 >60 mL/min   GFR calc Af Amer >60 >60 mL/min   Anion gap 8 5 - 15   Laboratory interpretation all normal      EKG None  Radiology No results found.  Procedures Procedures (including critical care time)  Medications Ordered in ED Medications  sodium chloride 0.9 % bolus 1,000 mL (1,000 mLs Intravenous New Bag/Given 04/12/18 0043)  fentaNYL (SUBLIMAZE) injection 50 mcg (50 mcg Intravenous Given 04/12/18 0042)  ondansetron (ZOFRAN) injection 4 mg (4 mg Intravenous Given 04/12/18 0041)  clindamycin (CLEOCIN) IVPB 900 mg (0 mg Intravenous Stopped 04/12/18 0116)     Initial Impression / Assessment and Plan / ED Course  I have reviewed the triage  vital signs and the nursing notes.  Pertinent labs & imaging results that were available during my care of the patient were reviewed by me and considered in my medical decision making (see chart for details).      Patient is failing outpatient treatment of her cellulitis.  She has a picture from 2 days ago and the size is about the same however it is more intensely red now.  She was given IV clindamycin and we discussed admission and she is agreeable.  She was given IV pain medication.  1:12 AM Dr. Shanon Brow, hospitalist will admit  Final Clinical Impressions(s) / ED Diagnoses   Final diagnoses:  Cellulitis of left upper arm    Plan admission  Rolland Porter, MD, Barbette Or, MD 04/12/18 [phone]

## 2018-04-12 NOTE — Plan of Care (Signed)

## 2018-04-12 NOTE — H&P (Signed)
History and Physical    Courtney Mcconnell PXT:[id number] DOB: 1955/05/22 DOA: 04/11/2018  PCP: Dettinger, Fransisca Kaufmann, MD  Patient coming from:  home  Chief Complaint:  Arm red  HPI: Courtney Mcconnell is a 63 y.o. female with medical history significant of COPD, GERD, seizure disorder comes in with a worsening rash to her left arm.  Patient reports she woke up Monday morning with scratches on her arm progress to being very red she went to the doctor on Wednesday who gave her a shot of Rocephin and put on ciprofloxacin for cellulitis.  She is been on ciprofloxacin for 48 hours and the redness has gotten a little worse.  The redness is in her antecubital area on the left patient denies any recent venipunctures there.  She denies any cats.  She denies any nausea vomiting diarrhea she denies any fevers or chills.  Patient be referred for admission for cellulitis has failed outpatient treatment with 1 dose of Rocephin and ciprofloxacin orally.   Review of Systems: As per HPI otherwise 10 point review of systems negative.   Past Medical History:  Diagnosis Date  . Asthma   . Bursitis of left hip    right  . COPD (chronic obstructive pulmonary disease) ([place])   . GERD (gastroesophageal reflux disease)   . Headache(784.0)   . Hypertension   . Pneumonia   . Seizure ([place])    most recent 12/12/14  . Sleep apnea     Past Surgical History:  Procedure Laterality Date  . ABDOMINAL HYSTERECTOMY    . APPENDECTOMY    . CATARACT EXTRACTION Bilateral   . CHOLECYSTECTOMY    . heal spur     right  . LASER PHOTO ABLATION Right 08/27/2013   Procedure: LASER PHOTO ABLATION;  Surgeon: Hayden Pedro, MD;  Location: [place];  Service: Ophthalmology;  Laterality: Right;  Headscope laser AND Endolaser  . MEMBRANE PEEL Right 08/27/2013   Procedure: MEMBRANE PEEL;  Surgeon: Hayden Pedro, MD;  Location: [place];  Service: Ophthalmology;  Laterality: Right;  . PARS PLANA VITRECTOMY Right 08/27/2013   Procedure: PARS  PLANA VITRECTOMY WITH 25 GAUGE;  Surgeon: Hayden Pedro, MD;  Location: [place];  Service: Ophthalmology;  Laterality: Right;     reports that she quit smoking about 4 years ago. Her smoking use included cigarettes. She has a 10.50 pack-year smoking history. She has never used smokeless tobacco. She reports that she does not drink alcohol or use drugs.  Allergies  Allergen Reactions  . Bee Venom Swelling  . Amoxicillin-Pot Clavulanate Rash  . Doxycycline Hyclate Rash    Rash and itching    Family History  Problem Relation Age of Onset  . Diabetes Father   . Hypertension Father   . Hyperlipidemia Father   . COPD Mother   . Diabetes Brother   . Stroke Paternal Grandmother   . Cancer Sister   . Cancer Paternal Uncle   . Colon cancer Neg Hx     Prior to Admission medications   Medication Sig Start Date End Date Taking? Authorizing Provider  albuterol (PROAIR HFA) 108 (90 Base) MCG/ACT inhaler TAKE TWO PUFFS EVERY 6 HOURS AS NEEDED FOR WHEEZE OR SHORTNESS OF BREATH 01/01/18   Dettinger, Fransisca Kaufmann, MD  ciprofloxacin (CIPRO) 500 MG tablet Take 1 tablet (500 mg total) by mouth 2 (two) times daily. 04/09/18   Claretta Fraise, MD  fluticasone (FLONASE) 50 MCG/ACT nasal spray PLACE 2 SPRAYS INTO BOTH NOSTRILS DAILY.  Patient taking differently: Place 2 sprays into both nostrils at bedtime.  12/30/15   Dettinger, Fransisca Kaufmann, MD  nitroGLYCERIN (NITROSTAT) 0.4 MG SL tablet Place 1 tablet (0.4 mg total) under the tongue every 5 (five) minutes as needed for chest pain. 11/20/17   Terald Sleeper, PA-C  omeprazole (PRILOSEC) 20 MG capsule Take 20 mg by mouth daily after supper.     [provider]  Spacer/Aero Chamber Mouthpiece MISC 1 each by Does not apply route every 6 (six) hours as needed. 04/20/16   Eustaquio Maize, MD  tiotropium (SPIRIVA HANDIHALER) 18 MCG inhalation capsule inhale the contents of one capsule in the handihaler once daily Patient taking differently: Place 18 mcg into  inhaler and inhale every morning.  01/31/18   Dettinger, Fransisca Kaufmann, MD  VIMPAT 100 MG TABS Take 300 mg by mouth 2 (two) times daily. 03/03/18   [provider]  zonisamide (ZONEGRAN) 100 MG capsule Take 100 mg by mouth 2 (two) times daily.  06/13/17   [provider]    Physical Exam: Vitals:   04/12/18 0044 04/12/18 0100 04/12/18 0130 04/12/18 0200  BP: (!) 154/73 136/65 140/72 130/71  Pulse: 63 62 60 (!) 58  Resp: 18 16 16 18   Temp:      TempSrc:      SpO2: 96% 98% 99% 100%  Weight:      Height:         Constitutional: NAD, calm, comfortable Vitals:   04/12/18 0044 04/12/18 0100 04/12/18 0130 04/12/18 0200  BP: (!) 154/73 136/65 140/72 130/71  Pulse: 63 62 60 (!) 58  Resp: 18 16 16 18   Temp:      TempSrc:      SpO2: 96% 98% 99% 100%  Weight:      Height:       Eyes: PERRL, lids and conjunctivae normal ENMT: Mucous membranes are moist. Posterior pharynx clear of any exudate or lesions.Normal dentition.  Neck: normal, supple, no masses, no thyromegaly Respiratory: clear to auscultation bilaterally, no wheezing, no crackles. Normal respiratory effort. No accessory muscle use.  Cardiovascular: Regular rate and rhythm, no murmurs / rubs / gallops. No extremity edema. 2+ pedal pulses. No carotid bruits.  Abdomen: no tenderness, no masses palpated. No hepatosplenomegaly. Bowel sounds positive.  Musculoskeletal: no clubbing / cyanosis. No joint deformity upper and lower extremities. Good ROM, no contractures. Normal muscle tone.  Skin: no  lesions, ulcers. No induration erythematous rash to left AC area that is not circumferential around the arm there is no fluctuance or induration no abscess area smaller than palm Neurologic: CN 2-12 grossly intact. Sensation intact, DTR normal. Strength 5/5 in all 4.  Psychiatric: Normal judgment and insight. Alert and oriented x 3. Normal mood.    Labs on Admission: I have personally reviewed following labs and imaging  studies  CBC: Recent Labs  Lab 04/11/18 2300  WBC 8.3  NEUTROABS 4.5  HGB 13.6  HCT 42.8  MCV 98.6  PLT 491   Basic Metabolic Panel: Recent Labs  Lab 04/11/18 2300  NA 138  K 3.6  CL 104  CO2 26  GLUCOSE 121*  BUN 12  CREATININE 0.94  CALCIUM 9.0   GFR: Estimated Creatinine Clearance: 79.1 mL/min (by C-G formula based on SCr of 0.94 mg/dL). Liver Function Tests: No results for input(s): AST, ALT, ALKPHOS, BILITOT, PROT, ALBUMIN in the last 168 hours. No results for input(s): LIPASE, AMYLASE in the last 168 hours. No results for input(s):  AMMONIA in the last 168 hours. Coagulation Profile: No results for input(s): INR, PROTIME in the last 168 hours. Cardiac Enzymes: No results for input(s): CKTOTAL, CKMB, CKMBINDEX, TROPONINI in the last 168 hours. BNP (last 3 results) No results for input(s): PROBNP in the last 8760 hours. HbA1C: No results for input(s): HGBA1C in the last 72 hours. CBG: No results for input(s): GLUCAP in the last 168 hours. Lipid Profile: No results for input(s): CHOL, HDL, LDLCALC, TRIG, CHOLHDL, LDLDIRECT in the last 72 hours. Thyroid Function Tests: No results for input(s): TSH, T4TOTAL, FREET4, T3FREE, THYROIDAB in the last 72 hours. Anemia Panel: No results for input(s): VITAMINB12, FOLATE, FERRITIN, TIBC, IRON, RETICCTPCT in the last 72 hours. Urine analysis:    Component Value Date/Time   COLORURINE STRAW (A) 05/26/2016 0008   APPEARANCEUR CLEAR 05/26/2016 0008   LABSPEC 1.004 (L) 05/26/2016 0008   PHURINE 7.0 05/26/2016 0008   GLUCOSEU NEGATIVE 05/26/2016 0008   HGBUR NEGATIVE 05/26/2016 0008   BILIRUBINUR NEGATIVE 05/26/2016 0008   KETONESUR NEGATIVE 05/26/2016 0008   PROTEINUR NEGATIVE 05/26/2016 0008   UROBILINOGEN 0.2 04/18/2014 1712   NITRITE NEGATIVE 05/26/2016 0008   LEUKOCYTESUR NEGATIVE 05/26/2016 0008   Sepsis Labs: !!!!!!!!!!!!!!!!!!!!!!!!!!!!!!!!!!!!!!!!!!!! @LABRCNTIP (procalcitonin:4,lacticidven:4) )No results  found for this or any previous visit (from the past 240 hour(s)).   Radiological Exams on Admission: No results found.  Old chart reviewed Case discussed with dr Daleen Bo knapp in ed   Assessment/Plan 63 year old female with left upper extremity cellulitis Principal Problem:   Cellulitis-placed on clindamycin patient not septic.  Active Problems:   Seizure disorder (HCC)-continue home meds    Pulmonary emphysema (HCC)-continue home nebs     DVT prophylaxis: scds Code Status:  full Family Communication: dtr Disposition Plan:  1- 2 days Consults called:  none Admission status:  admission   Dashley Monts A MD [facility]  If 7PM-7AM, please contact night-coverage [email or web address] Password [facility]  04/12/2018, 2:31 AM

## 2018-04-13 LAB — BASIC METABOLIC PANEL
Anion gap: 6 (ref 5–15)
BUN: 10 mg/dL (ref 8–23)
CO2: 25 mmol/L (ref 22–32)
Calcium: 8.5 mg/dL — ABNORMAL LOW (ref 8.9–10.3)
Chloride: 107 mmol/L (ref 98–111)
Creatinine, Ser: 0.86 mg/dL (ref 0.44–1.00)
GFR calc Af Amer: >60 mL/min (ref >60)
GFR calc non Af Amer: >60 mL/min (ref >60)
Glucose, Bld: 118 mg/dL — ABNORMAL HIGH (ref 70–99)
Potassium: 3.6 mmol/L (ref 3.5–5.1)
Sodium: 138 mmol/L (ref 135–145)

## 2018-04-13 LAB — CBC WITH DIFFERENTIAL/PLATELET
Abs Immature Granulocytes: 0.02 10*3/uL (ref 0.00–0.07)
Basophils Absolute: 0 10*3/uL (ref 0.0–0.1)
Basophils Relative: 1 %
Eosinophils Absolute: 0.2 10*3/uL (ref 0.0–0.5)
Eosinophils Relative: 3 %
HEMATOCRIT: 40.9 % (ref 36.0–46.0)
Hemoglobin: 12.8 g/dL (ref 12.0–15.0)
Immature Granulocytes: 0 %
LYMPHS ABS: 2.1 10*3/uL (ref 0.7–4.0)
Lymphocytes Relative: 35 %
MCH: 31.4 pg (ref 26.0–34.0)
MCHC: 31.3 g/dL (ref 30.0–36.0)
MCV: 100.5 fL — ABNORMAL HIGH (ref 80.0–100.0)
Monocytes Absolute: 0.7 10*3/uL (ref 0.1–1.0)
Monocytes Relative: 11 %
Neutro Abs: 3 10*3/uL (ref 1.7–7.7)
Neutrophils Relative %: 50 %
Platelets: 232 10*3/uL (ref 150–400)
RBC: 4.07 MIL/uL (ref 3.87–5.11)
RDW: 13.5 % (ref 11.5–15.5)
WBC: 6 10*3/uL (ref 4.0–10.5)
nRBC: 0 % (ref 0.0–0.2)

## 2018-04-13 LAB — MAGNESIUM: Magnesium: 2.2 mg/dL (ref 1.7–2.4)

## 2018-04-13 LAB — MRSA PCR SCREENING: MRSA by PCR: POSITIVE — AB

## 2018-04-13 MED ORDER — LIDOCAINE 5 % EX PTCH
2.0000 | MEDICATED_PATCH | CUTANEOUS | Status: DC
  Administered 2018-04-13: 2 via TRANSDERMAL
  Filled 2018-04-13: qty 2

## 2018-04-13 MED ORDER — CHLORHEXIDINE GLUCONATE CLOTH 2 % EX PADS
6.0000 | MEDICATED_PAD | Freq: Every day | CUTANEOUS | Status: DC
  Administered 2018-04-14: 6 via TOPICAL

## 2018-04-13 MED ORDER — MUPIROCIN 2 % EX OINT
TOPICAL_OINTMENT | Freq: Two times a day (BID) | CUTANEOUS | Status: DC
  Administered 2018-04-13 – 2018-04-14 (×2): via TOPICAL
  Filled 2018-04-13: qty 22

## 2018-04-13 MED ORDER — MUPIROCIN 2 % EX OINT
1.0000 "application " | TOPICAL_OINTMENT | Freq: Two times a day (BID) | CUTANEOUS | Status: DC
  Administered 2018-04-14: 1 via NASAL

## 2018-04-13 NOTE — Progress Notes (Addendum)
PROGRESS NOTE  Courtney Mcconnell YBO:[id number] DOB: 09/29/1955 DOA: 04/11/2018 PCP: Dettinger, Fransisca Kaufmann, MD  HPI/Recap of past 24 hours: Courtney Laurence Priddyis a 63 y.o.femalewith medical history significant ofCOPD, GERD, seizure disorder comes in with a worsening rash to her left arm. Patient reports she woke up Monday morning with scratches on her arm progress to being very red she went to the doctor on Wednesday who gave her a shot of Rocephin and put on ciprofloxacin for cellulitis. She is been on ciprofloxacin for 48 hours and the redness has gotten a little worse. The redness is in her antecubital area on the left patient denies any recent venipunctures there. She denies any cats. She denies any nausea vomiting diarrhea she denies any fevers or chills. Patient be referred for admission for cellulitis has failed outpatient treatment with 1 dose of Rocephin and ciprofloxacin orally.  04/12/18: Patient seen and examined at bedside.  Reports some tenderness at the site of her cellulitis.  Failed outpatient treatment for cellulitis with IM Rocephin and p.o. ciprofloxacin.  Continue IV clindamycin every 8 hours.  Restart seizure medications.  Reviewed vital signs, stable.  Reviewed labs, stable.  04/13/18: Patient seen and examined at her bedside.  Her son is present.  Reports burning sensation at the site of her cellulitis.  Ordered ice pack to apply to affected area and lidocaine patch to place next to lesion.  No other complaints.  Blood cultures x2 in process.  Assessment/Plan: Principal Problem:   Cellulitis Active Problems:   Seizure disorder (HCC)   Pulmonary emphysema (HCC)  Left upper extremity cellulitis Continue IV clindamycin Afebrile with no leukocytosis Blood cultures x2 in process Erythema appears to be improving Pain management in place with lidocaine patch to apply next to lesion Ice pack to apply for burning sensation and irritation Obtain MRSA screening Add bactroban  topical ointment  Seizure disorder Continue antiseizure medications Appears stable  Emphysema Continue home medications Maintaining O2 saturation greater than 90% on room air  Risks: Patient requires at least 2 midnights for treatment of her cellulitis with IV antibiotics after failing outpatient treatment.  She is high risk for further complication from cellulitis which includes bacteremia.  Blood cultures x2 in process.   DVT prophylaxis:  Subcu Lovenox daily Code Status:  full Family Communication:  Son at bedside. Disposition Plan:   DC to home with oral antibiotics possibly tomorrow 04/14/2018 if blood cultures negative x2. Consults called:  none   Objective: Vitals:   04/12/18 1412 04/12/18 2120 04/13/18 0605 04/13/18 0823  BP: 133/70 (!) 145/68 (!) 158/83   Pulse: 69 60 67   Resp: 19 20 18    Temp:  97.6 F (36.4 C) 98 F (36.7 C)   TempSrc:  Oral Oral   SpO2: 97% 97% 97% 96%  Weight:      Height:        Intake/Output Summary (Last 24 hours) at 04/13/2018 1207 Last data filed at 04/13/2018 0900 Gross per 24 hour  Intake 720 ml  Output -  Net 720 ml   Filed Weights   04/11/18 2153  Weight: 109.4 kg    Exam:  . General: 63 y.o. year-old female well developed well nourished in no acute distress.  Alert and oriented x3. . Cardiovascular: Regular rate and rhythm with no rubs or gallops.  No thyromegaly or JVD noted.   Marland Kitchen Respiratory: Clear to auscultation with no wheezes or rales. Good inspiratory effort. . Abdomen: Soft nontender nondistended with normal bowel  sounds x4 quadrants. . Skin: Improving left antecubital rash with some tenderness on palpation and warmth. . Psychiatry: Mood is appropriate for condition and setting   Data Reviewed: CBC: Recent Labs  Lab 04/11/18 2300 04/13/18 0629  WBC 8.3 6.0  NEUTROABS 4.5 3.0  HGB 13.6 12.8  HCT 42.8 40.9  MCV 98.6 100.5*  PLT 242 481   Basic Metabolic Panel: Recent Labs  Lab 04/11/18 2300 04/13/18 0629   NA 138 138  K 3.6 3.6  CL 104 107  CO2 26 25  GLUCOSE 121* 118*  BUN 12 10  CREATININE 0.94 0.86  CALCIUM 9.0 8.5*  MG  --  2.2   GFR: Estimated Creatinine Clearance: 86.4 mL/min (by C-G formula based on SCr of 0.86 mg/dL). Liver Function Tests: No results for input(s): AST, ALT, ALKPHOS, BILITOT, PROT, ALBUMIN in the last 168 hours. No results for input(s): LIPASE, AMYLASE in the last 168 hours. No results for input(s): AMMONIA in the last 168 hours. Coagulation Profile: No results for input(s): INR, PROTIME in the last 168 hours. Cardiac Enzymes: No results for input(s): CKTOTAL, CKMB, CKMBINDEX, TROPONINI in the last 168 hours. BNP (last 3 results) No results for input(s): PROBNP in the last 8760 hours. HbA1C: No results for input(s): HGBA1C in the last 72 hours. CBG: No results for input(s): GLUCAP in the last 168 hours. Lipid Profile: No results for input(s): CHOL, HDL, LDLCALC, TRIG, CHOLHDL, LDLDIRECT in the last 72 hours. Thyroid Function Tests: No results for input(s): TSH, T4TOTAL, FREET4, T3FREE, THYROIDAB in the last 72 hours. Anemia Panel: No results for input(s): VITAMINB12, FOLATE, FERRITIN, TIBC, IRON, RETICCTPCT in the last 72 hours. Urine analysis:    Component Value Date/Time   COLORURINE STRAW (A) 05/26/2016 0008   APPEARANCEUR CLEAR 05/26/2016 0008   LABSPEC 1.004 (L) 05/26/2016 0008   PHURINE 7.0 05/26/2016 0008   GLUCOSEU NEGATIVE 05/26/2016 0008   HGBUR NEGATIVE 05/26/2016 0008   BILIRUBINUR NEGATIVE 05/26/2016 0008   KETONESUR NEGATIVE 05/26/2016 0008   PROTEINUR NEGATIVE 05/26/2016 0008   UROBILINOGEN 0.2 04/18/2014 1712   NITRITE NEGATIVE 05/26/2016 0008   LEUKOCYTESUR NEGATIVE 05/26/2016 0008   Sepsis Labs: @LABRCNTIP (procalcitonin:4,lacticidven:4)  ) Recent Results (from the past 240 hour(s))  Culture, blood (routine x 2)     Status: None (Preliminary result)   Collection Time: 04/13/18  8:38 AM  Result Value Ref Range Status    Specimen Description BLOOD RIGHT HAND BOTTLES DRAWN AEROBIC ONLY  Final   Special Requests   Final    Blood Culture adequate volume Performed at [facility], [address] [place], [place] [address]    Culture PENDING  Incomplete   Report Status PENDING  Incomplete  Culture, blood (routine x 2)     Status: None (Preliminary result)   Collection Time: 04/13/18  8:38 AM  Result Value Ref Range Status   Specimen Description BLOOD RIGHT ARM BOTTLES DRAWN AEROBIC ONLY  Final   Special Requests   Final    Blood Culture adequate volume Performed at [facility], [address], [place], [place] [address]    Culture PENDING  Incomplete   Report Status PENDING  Incomplete      Studies: No results found.  Scheduled Meds: . enoxaparin (LOVENOX) injection  40 mg Subcutaneous Q24H  . lacosamide  300 mg Oral BID  . lidocaine  2 patch Transdermal Q24H  . pantoprazole  40 mg Oral Daily  . sodium chloride flush  3 mL Intravenous Q12H  . tiotropium  18 mcg Inhalation q morning - 10a  . zonisamide  100 mg Oral BID    Continuous Infusions: . sodium chloride    . clindamycin (CLEOCIN) IV 900 mg (04/13/18 1102)     LOS: 1 day     Kayleen Memos, MD [facility] Pager [phone]  If 7PM-7AM, please contact night-coverage [email or web address] Password [facility] 04/13/2018, 12:07 PM

## 2018-04-14 DIAGNOSIS — J439 Emphysema, unspecified: Secondary | ICD-10-CM

## 2018-04-14 DIAGNOSIS — G40909 Epilepsy, unspecified, not intractable, without status epilepticus: Secondary | ICD-10-CM

## 2018-04-14 MED ORDER — CLINDAMYCIN HCL 300 MG PO CAPS: 300.0000 mg | ORAL_CAPSULE | Freq: Four times a day (QID) | ORAL | 0 refills | Status: DC

## 2018-04-14 MED ORDER — CLINDAMYCIN HCL 150 MG PO CAPS: 300.0000 mg | ORAL_CAPSULE | Freq: Four times a day (QID) | ORAL | Status: DC

## 2018-04-14 NOTE — Discharge Summary (Signed)
Physician Discharge Summary  Courtney Mcconnell YWV:[id number] DOB: 06/26/1955 DOA: 04/11/2018  PCP: Worthy Rancher, MD  Admit date: 04/11/2018 Discharge date: 04/14/2018  Admitted From: Home Disposition:  Home   Recommendations for Outpatient Follow-up:  1. Follow up with PCP in 1-2 weeks 2. Please obtain BMP/CBC in one week     Discharge Condition: Stable CODE STATUS: FULL Diet recommendation: Heart Healthy    Brief/Interim Summary: 63 y.o.femalewith medical history significant ofCOPD, GERD, seizure disorder comes in with a worsening rash to her left arm. Patient reports she woke up Monday morning with scratches on her arm progress to being very red she went to the doctor on Wednesday who gave her a shot of Rocephin and put on ciprofloxacin for cellulitis. She is been on ciprofloxacin for 48 hours and the redness has gotten a little worse. The redness is in her antecubital area on the left patient denies any recent venipunctures there. She denies any cats. She denies any nausea vomiting diarrhea she denies any fevers or chills. Patient be referred for admission for cellulitis has failed outpatient treatment with 1 dose of Rocephin and ciprofloxacin orally.  04/12/18:Patient seen and examined at bedside. Reports some tenderness at the site of hercellulitis.  Failed outpatient treatment for cellulitis with IM Rocephin and p.o. ciprofloxacin. Continue IV clindamycin every 8 hours. Restart seizure medications. Reviewed vital signs, stable. Reviewed labs, stable.  04/13/18: Patient seen and examined at her bedside.  Her son is present.  Reports burning sensation at the site of her cellulitis.  Ordered ice pack to apply to affected area and lidocaine patch to place next to lesion.  No other complaints.  Blood cultures x2 in process.  04/14/18--erythema and pain continue to improve  Discharge Diagnoses:   Left upper extremity cellulitis Continue IV clindamycin>>>home with  po clnida x 5 more days to complete 1 week Afebrile with no leukocytosis Blood cultures x2 in process--neg at time of d/c Erythema and pain improving Pain management in place with lidocaine patch to apply next to lesion Ice pack to apply for burning sensation and irritation -there is a superimposed component of contact/atopic dermatitis  Seizure disorder Continue antiseizure medications Appears stable  Emphysema Continue home medications Maintaining O2 saturation greater than 90% on room air -stable on RA  Morbid Obesity -lifestyle modification  Discharge Instructions   Allergies as of 04/14/2018      Reactions   Bee Venom Swelling   Amoxicillin-pot Clavulanate Rash   Doxycycline Hyclate Rash   Rash and itching      Medication List    STOP taking these medications   ciprofloxacin 500 MG tablet Commonly known as:  CIPRO     TAKE these medications   albuterol 108 (90 Base) MCG/ACT inhaler Commonly known as:  PROAIR HFA TAKE TWO PUFFS EVERY 6 HOURS AS NEEDED FOR WHEEZE OR SHORTNESS OF BREATH What changed:    how much to take  how to take this  when to take this  additional instructions   aspirin-sod bicarb-citric acid 325 MG Tbef tablet Commonly known as:  ALKA-SELTZER Take 325 mg by mouth every 6 (six) hours as needed (acid reflux).   clindamycin 300 MG capsule Commonly known as:  CLEOCIN Take 1 capsule (300 mg total) by mouth every 6 (six) hours.   fluticasone 50 MCG/ACT nasal spray Commonly known as:  FLONASE PLACE 2 SPRAYS INTO BOTH NOSTRILS DAILY. What changed:  when to take this   nitroGLYCERIN 0.4 MG SL tablet Commonly known as:  NITROSTAT Place 1 tablet (0.4 mg total) under the tongue every 5 (five) minutes as needed for chest pain.   omeprazole 20 MG capsule Commonly known as:  PRILOSEC Take 20 mg by mouth 2 (two) times daily before a meal.   tiotropium 18 MCG inhalation capsule Commonly known as:  SPIRIVA HANDIHALER inhale the contents  of one capsule in the handihaler once daily What changed:    how much to take  how to take this  when to take this  additional instructions   VIMPAT 100 MG Tabs Generic drug:  Lacosamide Take 300 mg by mouth 2 (two) times daily.   zonisamide 100 MG capsule Commonly known as:  ZONEGRAN Take 100 mg by mouth daily.       Allergies  Allergen Reactions  . Bee Venom Swelling  . Amoxicillin-Pot Clavulanate Rash  . Doxycycline Hyclate Rash    Rash and itching    Consultations:  none   Procedures/Studies:  No results found.      Discharge Exam: Vitals:   04/13/18 2123 04/14/18 0529  BP: (!) 157/79 138/64  Pulse: 71 66  Resp: 15 15  Temp: 98.4 F (36.9 C) 98 F (36.7 C)  SpO2: 99% 90%   Vitals:   04/13/18 0823 04/13/18 1336 04/13/18 2123 04/14/18 0529  BP:  126/67 (!) 157/79 138/64  Pulse:  69 71 66  Resp:  19 15 15   Temp:  98.1 F (36.7 C) 98.4 F (36.9 C) 98 F (36.7 C)  TempSrc:   Oral Oral  SpO2: 96% 100% 99% 90%  Weight:      Height:        General: Pt is alert, awake, not in acute distress Cardiovascular: RRR, S1/S2 +, no rubs, no gallops Respiratory: CTA bilaterally, no wheezing, no rhonchi Abdominal: Soft, NT, ND, bowel sounds + Extremities: see pictures       The results of significant diagnostics from this hospitalization (including imaging, microbiology, ancillary and laboratory) are listed below for reference.    Significant Diagnostic Studies: No results found.   Microbiology: Recent Results (from the past 240 hour(s))  Culture, blood (routine x 2)     Status: None (Preliminary result)   Collection Time: 04/13/18  8:38 AM  Result Value Ref Range Status   Specimen Description BLOOD RIGHT HAND BOTTLES DRAWN AEROBIC ONLY  Final   Special Requests Blood Culture adequate volume  Final   Culture   Final    NO GROWTH < 24 HOURS Performed at [facility], [address] [place], [place] [address]    Report Status PENDING   Incomplete  Culture, blood (routine x 2)     Status: None (Preliminary result)   Collection Time: 04/13/18  8:38 AM  Result Value Ref Range Status   Specimen Description BLOOD RIGHT ARM BOTTLES DRAWN AEROBIC ONLY  Final   Special Requests Blood Culture adequate volume  Final   Culture   Final    NO GROWTH < 24 HOURS Performed at [facility], [address] [place], [place] [address]    Report Status PENDING  Incomplete  MRSA PCR Screening     Status: Abnormal   Collection Time: 04/13/18 12:16 PM  Result Value Ref Range Status   MRSA by PCR POSITIVE (A) NEGATIVE Final    Comment:        The GeneXpert MRSA Assay (FDA approved for NASAL specimens only), is one component of a comprehensive MRSA colonization surveillance program. It is not intended to diagnose  MRSA infection nor to guide or monitor treatment for MRSA infections. RESULT CALLED TO, READ BACK BY AND VERIFIED WITH: MARTIN D. @ 1734 ON [id number] BY HENDERSON L. Performed at [facility], [address], [place], [place] [address]      Labs: Basic Metabolic Panel: Recent Labs  Lab 04/11/18 2300 04/13/18 0629  NA 138 138  K 3.6 3.6  CL 104 107  CO2 26 25  GLUCOSE 121* 118*  BUN 12 10  CREATININE 0.94 0.86  CALCIUM 9.0 8.5*  MG  --  2.2   Liver Function Tests: No results for input(s): AST, ALT, ALKPHOS, BILITOT, PROT, ALBUMIN in the last 168 hours. No results for input(s): LIPASE, AMYLASE in the last 168 hours. No results for input(s): AMMONIA in the last 168 hours. CBC: Recent Labs  Lab 04/11/18 2300 04/13/18 0629  WBC 8.3 6.0  NEUTROABS 4.5 3.0  HGB 13.6 12.8  HCT 42.8 40.9  MCV 98.6 100.5*  PLT 242 232   Cardiac Enzymes: No results for input(s): CKTOTAL, CKMB, CKMBINDEX, TROPONINI in the last 168 hours. BNP: Invalid input(s): POCBNP CBG: No results for input(s): GLUCAP in the last 168 hours.  Time coordinating discharge:  36 minutes  Signed:  Orson Eva, DO [facility] Pager:  [phone] 04/14/2018, 8:38 AM

## 2018-04-14 NOTE — Progress Notes (Signed)
Nsg Discharge Note  Admit Date:  04/11/2018 Discharge date: 04/14/2018   Zaryia Markel Baumgardner to be D/C'd home per MD order.  AVS completed.  Copy for chart, and copy for patient signed, and dated. Patient/caregiver able to verbalize understanding.  Discharge Medication: Allergies as of 04/14/2018      Reactions   Bee Venom Swelling   Amoxicillin-pot Clavulanate Rash   Doxycycline Hyclate Rash   Rash and itching      Medication List    STOP taking these medications   ciprofloxacin 500 MG tablet Commonly known as:  CIPRO     TAKE these medications   albuterol 108 (90 Base) MCG/ACT inhaler Commonly known as:  PROAIR HFA TAKE TWO PUFFS EVERY 6 HOURS AS NEEDED FOR WHEEZE OR SHORTNESS OF BREATH What changed:    how much to take  how to take this  when to take this  additional instructions   aspirin-sod bicarb-citric acid 325 MG Tbef tablet Commonly known as:  ALKA-SELTZER Take 325 mg by mouth every 6 (six) hours as needed (acid reflux).   clindamycin 300 MG capsule Commonly known as:  CLEOCIN Take 1 capsule (300 mg total) by mouth every 6 (six) hours.   fluticasone 50 MCG/ACT nasal spray Commonly known as:  FLONASE PLACE 2 SPRAYS INTO BOTH NOSTRILS DAILY. What changed:  when to take this   nitroGLYCERIN 0.4 MG SL tablet Commonly known as:  NITROSTAT Place 1 tablet (0.4 mg total) under the tongue every 5 (five) minutes as needed for chest pain.   omeprazole 20 MG capsule Commonly known as:  PRILOSEC Take 20 mg by mouth 2 (two) times daily before a meal.   tiotropium 18 MCG inhalation capsule Commonly known as:  SPIRIVA HANDIHALER inhale the contents of one capsule in the handihaler once daily What changed:    how much to take  how to take this  when to take this  additional instructions   VIMPAT 100 MG Tabs Generic drug:  Lacosamide Take 300 mg by mouth 2 (two) times daily.   zonisamide 100 MG capsule Commonly known as:  ZONEGRAN Take 100 mg by mouth  daily.       Discharge Assessment: Vitals:   04/13/18 2123 04/14/18 0529  BP: (!) 157/79 138/64  Pulse: 71 66  Resp: 15 15  Temp: 98.4 F (36.9 C) 98 F (36.7 C)  SpO2: 99% 90%   Skin clean, dry and intact without evidence of skin break down, no evidence of skin tears noted. IV catheter discontinued intact. Site without signs and symptoms of complications - no redness or edema noted at insertion site, patient denies c/o pain - only slight tenderness at site.  Dressing with slight pressure applied.  D/c Instructions-Education: Discharge instructions given to patient/family with verbalized understanding. D/c education completed with patient/family including follow up instructions, medication list, d/c activities limitations if indicated, with other d/c instructions as indicated by MD - patient able to verbalize understanding, all questions fully answered. Patient instructed to return to ED, call 911, or call MD for any changes in condition.  Patient escorted via [place], and D/C home via private auto.  Loa Socks, RN 04/14/2018 8:55 AM

## 2018-04-16 ENCOUNTER — Telehealth: Payer: Self-pay | Admitting: *Deleted

## 2018-04-16 NOTE — Telephone Encounter (Signed)
error 

## 2018-04-18 ENCOUNTER — Other Ambulatory Visit: Payer: Self-pay | Admitting: Family Medicine

## 2018-04-18 LAB — CULTURE, BLOOD (ROUTINE X 2)
Culture: NO GROWTH
Culture: NO GROWTH
Special Requests: ADEQUATE
Special Requests: ADEQUATE

## 2018-05-03 ENCOUNTER — Telehealth: Payer: Self-pay | Admitting: Family Medicine

## 2018-05-03 NOTE — Telephone Encounter (Signed)
Pt is wanting a refill on her clindamycin (CLEOCIN) 300 MG capsule   Pharmacy [facility]

## 2018-05-05 MED ORDER — CLINDAMYCIN HCL 300 MG PO CAPS: 300.0000 mg | ORAL_CAPSULE | Freq: Four times a day (QID) | ORAL | 0 refills | Status: DC

## 2018-05-05 NOTE — Telephone Encounter (Signed)
Pt's arm is not completely healed up Wanting to know if she can get another round of abx Please advise

## 2018-05-05 NOTE — Telephone Encounter (Signed)
I sent in a refill in the patient's clindamycin

## 2018-05-05 NOTE — Telephone Encounter (Signed)
Patient aware.

## 2018-05-13 ENCOUNTER — Other Ambulatory Visit: Payer: Self-pay

## 2018-05-13 ENCOUNTER — Encounter: Payer: Self-pay | Admitting: Family

## 2018-05-13 ENCOUNTER — Telehealth: Payer: Self-pay | Admitting: Family Medicine

## 2018-05-13 ENCOUNTER — Ambulatory Visit (INDEPENDENT_AMBULATORY_CARE_PROVIDER_SITE_OTHER): Payer: Medicare Other | Admitting: Family

## 2018-05-13 DIAGNOSIS — H9202 Otalgia, left ear: Secondary | ICD-10-CM

## 2018-05-13 DIAGNOSIS — H60502 Unspecified acute noninfective otitis externa, left ear: Secondary | ICD-10-CM

## 2018-05-13 MED ORDER — CIPROFLOXACIN-DEXAMETHASONE 0.3-0.1 % OT SUSP: 4.0000 [drp] | Freq: Two times a day (BID) | OTIC | 0 refills | Status: AC

## 2018-05-13 MED ORDER — NEOMYCIN-COLIST-HC-THONZONIUM 3.3-3-10-0.5 MG/ML OT SUSP: 3.0000 [drp] | Freq: Four times a day (QID) | OTIC | 0 refills | Status: DC

## 2018-05-13 MED ORDER — CIPROFLOXACIN-HYDROCORTISONE 0.2-1 % OT SUSP: 3.0000 [drp] | Freq: Two times a day (BID) | OTIC | 0 refills | Status: DC

## 2018-05-13 NOTE — Telephone Encounter (Signed)
Sent cortisporin 

## 2018-05-13 NOTE — Progress Notes (Signed)
   Virtual Visit via telephone Note  I connected with Kaoir W Mittag on 05/13/18 at 9:57AM by telephone and verified that I am speaking with the correct person using two identifiers. Edris W Pavia is currently located at home and son is currently with her during visit. The provider, Evelina Dun, FNP is located in their office at time of visit.  I discussed the limitations, risks, security and privacy concerns of performing an evaluation and management service by telephone and the availability of in person appointments. I also discussed with the patient that there may be a patient responsible charge related to this service. The patient expressed understanding and agreed to proceed.   History and Present Illness:   Otalgia   There is pain in the left ear. This is a new problem. The current episode started yesterday. The problem occurs constantly. The problem has been unchanged. There has been no fever. The pain is at a severity of 8/10. Pertinent negatives include no coughing, ear discharge, hearing loss, rhinorrhea or sore throat. She has tried ear drops for the symptoms. The treatment provided moderate relief.      Review of Systems  HENT: Positive for ear pain. Negative for ear discharge, hearing loss, rhinorrhea and sore throat.   Respiratory: Negative for cough.   All other systems reviewed and are negative.     Observations/Objective: No SOB or distress noted  Assessment and Plan: 1. Left ear pain - ciprofloxacin-hydrocortisone (CIPRO HC) OTIC suspension; Place 3 drops into the left ear 2 (two) times daily.  Dispense: 10 mL; Refill: 0  2. Acute otitis externa of left ear, unspecified type PT currently taking clindamycin for cellulitis, complete this and start Cipro HC  Tylenol as needed for pain Force fluids Keep ear clean and dry RTO if symptoms worsen or do not improve   - ciprofloxacin-hydrocortisone (CIPRO HC) OTIC suspension; Place 3 drops into the left ear 2 (two)  times daily.  Dispense: 10 mL; Refill: 0     I discussed the assessment and treatment plan with the patient. The patient was provided an opportunity to ask questions and all were answered. The patient agreed with the plan and demonstrated an understanding of the instructions.   The patient was advised to call back or seek an in-person evaluation if the symptoms worsen or if the condition fails to improve as anticipated.  The above assessment and management plan was discussed with the patient. The patient verbalized understanding of and has agreed to the management plan. Patient is aware to call the clinic if symptoms persist or worsen. Patient is aware when to return to the clinic for a follow-up visit. Patient educated on when it is appropriate to go to the emergency department.    Call ended 10:07Am, I provided 10 minutes of non-face-to-face time during this encounter.    Evelina Dun, FNP

## 2018-05-13 NOTE — Telephone Encounter (Signed)
Please let the patient know that I sent Ciprodex for her because her insurance that it would be covered better

## 2018-05-30 ENCOUNTER — Other Ambulatory Visit: Payer: Self-pay | Admitting: Family Medicine

## 2018-06-02 ENCOUNTER — Encounter: Payer: Self-pay | Admitting: Family Medicine

## 2018-06-02 ENCOUNTER — Encounter (HOSPITAL_COMMUNITY): Payer: Self-pay | Admitting: Emergency Medicine

## 2018-06-02 ENCOUNTER — Other Ambulatory Visit: Payer: Self-pay

## 2018-06-02 ENCOUNTER — Ambulatory Visit (INDEPENDENT_AMBULATORY_CARE_PROVIDER_SITE_OTHER): Payer: Medicare Other | Admitting: Family Medicine

## 2018-06-02 ENCOUNTER — Emergency Department (HOSPITAL_COMMUNITY)
Admission: EM | Admit: 2018-06-02 | Discharge: 2018-06-02 | Disposition: A | Discharge status: Home / Self Care | Payer: Medicare Other | Attending: Emergency Medicine | Admitting: Emergency Medicine

## 2018-06-02 ENCOUNTER — Emergency Department (HOSPITAL_COMMUNITY): Payer: Medicare Other

## 2018-06-02 DIAGNOSIS — I10 Essential (primary) hypertension: Secondary | ICD-10-CM | POA: Diagnosis not present

## 2018-06-02 DIAGNOSIS — R519 Headache, unspecified: Secondary | ICD-10-CM

## 2018-06-02 DIAGNOSIS — R569 Unspecified convulsions: Secondary | ICD-10-CM | POA: Diagnosis not present

## 2018-06-02 DIAGNOSIS — H539 Unspecified visual disturbance: Secondary | ICD-10-CM | POA: Diagnosis not present

## 2018-06-02 DIAGNOSIS — G40909 Epilepsy, unspecified, not intractable, without status epilepticus: Secondary | ICD-10-CM

## 2018-06-02 DIAGNOSIS — R51 Headache: Secondary | ICD-10-CM | POA: Insufficient documentation

## 2018-06-02 DIAGNOSIS — Z87891 Personal history of nicotine dependence: Secondary | ICD-10-CM | POA: Diagnosis not present

## 2018-06-02 DIAGNOSIS — M542 Cervicalgia: Secondary | ICD-10-CM

## 2018-06-02 DIAGNOSIS — G4452 New daily persistent headache (NDPH): Secondary | ICD-10-CM | POA: Diagnosis not present

## 2018-06-02 DIAGNOSIS — J45909 Unspecified asthma, uncomplicated: Secondary | ICD-10-CM | POA: Diagnosis not present

## 2018-06-02 DIAGNOSIS — S199XXA Unspecified injury of neck, initial encounter: Secondary | ICD-10-CM | POA: Diagnosis not present

## 2018-06-02 DIAGNOSIS — S0990XA Unspecified injury of head, initial encounter: Secondary | ICD-10-CM | POA: Diagnosis not present

## 2018-06-02 DIAGNOSIS — Z79899 Other long term (current) drug therapy: Secondary | ICD-10-CM | POA: Diagnosis not present

## 2018-06-02 NOTE — ED Triage Notes (Signed)
Pt states having a seizure and fell across the bath tub on Saturday.  Pt now c/o of neck pain and headache.  Denies missing any dosing of seizure medication

## 2018-06-02 NOTE — Patient Instructions (Signed)
Pt to go to [facility] ED for further evaluation.

## 2018-06-02 NOTE — ED Provider Notes (Signed)
[facility] Provider Note   CSN: [id number] Arrival date & time: 06/02/18  1339    History   Chief Complaint Chief Complaint  Patient presents with   Neck Pain    HPI Courtney Mcconnell is a 63 y.o. female.     HPI   62yF with neck pain and headache. Pt thinks she had a seizure on Saturday. She fell against a bathtub. Headache and neck pain since. Some bruising and an abrasion to L hand and forearm but no significant pain there. Mild nausea. No acute numbness, tingling or loss of strength. She reports compliance with her seizure medications.   Past Medical History:  Diagnosis Date   Asthma    Bursitis of left hip    right   COPD (chronic obstructive pulmonary disease) (HCC)    GERD (gastroesophageal reflux disease)    Headache(784.0)    Hypertension    Pneumonia    Seizure ([place])    most recent 12/12/14   Sleep apnea     Patient Active Problem List   Diagnosis Date Noted   Cellulitis 04/12/2018   Cellulitis of left upper arm    Pulmonary nodules 12/08/2015   Draining cutaneous sinus tract 07/15/2015   Sleep apnea 04/06/2015   Pulmonary emphysema ([place]) 03/02/2015   Smoker 02/16/2015   Seizure disorder ([place]) 01/21/2015   Focal epilepsy with impairment of consciousness ([place]) 12/13/2014   Seizures ([place]) 04/18/2014   HTN (hypertension) 04/18/2014   Preretinal fibrosis, 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   Tobacco use disorder 07/19/2012    Past Surgical History:  Procedure Laterality Date   ABDOMINAL HYSTERECTOMY     APPENDECTOMY     CATARACT EXTRACTION Bilateral    CHOLECYSTECTOMY     heal spur     right   LASER PHOTO ABLATION Right 08/27/2013   Procedure: LASER PHOTO ABLATION;  Surgeon: Hayden Pedro, MD;  Location: [facility];  Service: Ophthalmology;  Laterality: Right;  Headscope laser AND Endolaser   MEMBRANE PEEL Right 08/27/2013   Procedure: MEMBRANE PEEL;  Surgeon: Hayden Pedro, MD;  Location: [facility];  Service:  Ophthalmology;  Laterality: Right;   PARS PLANA VITRECTOMY Right 08/27/2013   Procedure: PARS PLANA VITRECTOMY WITH 25 GAUGE;  Surgeon: Hayden Pedro, MD;  Location: [facility];  Service: Ophthalmology;  Laterality: Right;     OB History    Gravida  2   Para  2   Term  2   Preterm      AB      Living  3     SAB      TAB      Ectopic      Multiple      Live Births               Home Medications    Prior to Admission medications   Medication Sig Start Date End Date Taking? Authorizing Provider  albuterol (VENTOLIN HFA) 108 (90 Base) MCG/ACT inhaler INHALE TWO PUFFS INTO THE LUNGS EVERY 6 HOURS AS NEEDED FOR WHEEZING OR SHORTNESS OF BREATH 06/02/18   Dettinger, Fransisca Kaufmann, MD  aspirin-sod bicarb-citric acid (ALKA-SELTZER) 325 MG TBEF tablet Take 325 mg by mouth every 6 (six) hours as needed (acid reflux).    [provider]  ciprofloxacin-hydrocortisone (CIPRO HC) OTIC suspension Place 3 drops into the left ear 2 (two) times daily. 05/13/18   Sharion Balloon, FNP  clindamycin (CLEOCIN) 300 MG capsule Take 1 capsule (300 mg total)  by mouth every 6 (six) hours. 05/05/18   Dettinger, Fransisca Kaufmann, MD  fluticasone (FLONASE) 50 MCG/ACT nasal spray PLACE 2 SPRAYS INTO BOTH NOSTRILS DAILY. Patient taking differently: Place 2 sprays into both nostrils at bedtime.  12/30/15   Dettinger, Fransisca Kaufmann, MD  neomycin-colistin-hydrocortisone-thonzonium (CORTISPORIN-TC) 3.04-14-08-0.5 MG/ML OTIC suspension Place 3 drops into the left ear 4 (four) times daily. 05/13/18   Dettinger, Fransisca Kaufmann, MD  nitroGLYCERIN (NITROSTAT) 0.4 MG SL tablet Place 1 tablet (0.4 mg total) under the tongue every 5 (five) minutes as needed for chest pain. 11/20/17   Terald Sleeper, PA-C  omeprazole (PRILOSEC) 20 MG capsule Take 20 mg by mouth 2 (two) times daily before a meal.     [provider]  tiotropium (SPIRIVA HANDIHALER) 18 MCG inhalation capsule inhale the contents of one capsule in the handihaler once  daily Patient taking differently: Place 18 mcg into inhaler and inhale every morning.  01/31/18   Dettinger, Fransisca Kaufmann, MD  VIMPAT 100 MG TABS Take 300 mg by mouth 2 (two) times daily. 03/03/18   [provider]  zonisamide (ZONEGRAN) 100 MG capsule Take 100 mg by mouth daily.  06/13/17   [provider]    Family History Family History  Problem Relation Age of Onset   Diabetes Father    Hypertension Father    Hyperlipidemia Father    COPD Mother    Diabetes Brother    Stroke Paternal Grandmother    Cancer Sister    Cancer Paternal Uncle    Colon cancer Neg Hx     Social History Social History   Tobacco Use   Smoking status: Former Smoker    Packs/day: 0.25    Years: 42.00    Pack years: 10.50    Types: Cigarettes    Last attempt to quit: 03/20/2014    Years since quitting: 4.2   Smokeless tobacco: Never Used   Tobacco comment: 01/21/15 restarted smoking 4 mos ago  Substance Use Topics   Alcohol use: No    Alcohol/week: 0.0 standard drinks    Frequency: Never   Drug use: No     Allergies   Bee venom; Amoxicillin-pot clavulanate; and Doxycycline hyclate   Review of Systems Review of Systems  All systems reviewed and negative, other than as noted in HPI.  Physical Exam Updated Vital Signs Pulse 62    Temp 98.8 F (37.1 C)    Resp 20    Ht 5\' 7"  (1.702 m)    Wt 108.9 kg    SpO2 97%    BMI 37.59 kg/m   Physical Exam Vitals signs and nursing note reviewed.  Constitutional:      General: She is not in acute distress.    Appearance: She is well-developed.  HENT:     Head: Normocephalic and atraumatic.  Eyes:     General:        Right eye: No discharge.        Left eye: No discharge.     Conjunctiva/sclera: Conjunctivae normal.  Neck:     Musculoskeletal: Neck supple.  Cardiovascular:     Rate and Rhythm: Normal rate and regular rhythm.     Heart sounds: Normal heart sounds. No murmur. No friction rub. No gallop.   Pulmonary:       Effort: Pulmonary effort is normal. No respiratory distress.     Breath sounds: Normal breath sounds.  Abdominal:     General: There is no distension.  Palpations: Abdomen is soft.     Tenderness: There is no abdominal tenderness.  Musculoskeletal:        General: Tenderness present.     Comments: TTP mid/lower neck midline and R laterally. small area of brusing w/o significant bony tenderness L forearm. Abrasion dorsum L hand.   Skin:    General: Skin is warm and dry.  Neurological:     Mental Status: She is alert.  Psychiatric:        Behavior: Behavior normal.        Thought Content: Thought content normal.      ED Treatments / Results  Labs (all labs ordered are listed, but only abnormal results are displayed) Labs Reviewed - No data to display  EKG None  Radiology Ct Head Wo Contrast  Result Date: 06/02/2018 CLINICAL DATA:  63 year old female with seizure and fall 2 days ago with continued head and neck pain. Initial encounter. EXAM: CT HEAD WITHOUT CONTRAST CT CERVICAL SPINE WITHOUT CONTRAST TECHNIQUE: Multidetector CT imaging of the head and cervical spine was performed following the standard protocol without intravenous contrast. Multiplanar CT image reconstructions of the cervical spine were also generated. COMPARISON:  02/12/2018 and prior head CTs FINDINGS: CT HEAD FINDINGS Brain: No evidence of acute infarction, hemorrhage, hydrocephalus, extra-axial collection or mass lesion/mass effect. A remote LEFT caudate lacunar infarct is noted. Vascular: Carotid atherosclerotic calcifications noted. Skull: Normal. Negative for fracture or focal lesion. Sinuses/Orbits: No acute finding. A small RIGHT mastoid effusion is again identified. Other: None. CT CERVICAL SPINE FINDINGS Alignment: Normal. Skull base and vertebrae: No acute fracture. No primary bone lesion or focal pathologic process. Soft tissues and spinal canal: No prevertebral fluid or swelling. No visible canal  hematoma. Disc levels: Mild-to-moderate degenerative disc disease/spondylosis at C5-6 and C6-7 noted. Upper chest: No acute abnormality Other: None IMPRESSION: 1. No evidence of acute intracranial abnormality. Remote LEFT caudate infarct. 2. No static evidence of acute injury to the cervical spine. Mild to moderate degenerative disc disease at C5-6 and C6-7. Electronically Signed   By: Margarette Canada M.D.   On: 06/02/2018 15:13   Ct Cervical Spine Wo Contrast  Result Date: 06/02/2018 CLINICAL DATA:  63 year old female with seizure and fall 2 days ago with continued head and neck pain. Initial encounter. EXAM: CT HEAD WITHOUT CONTRAST CT CERVICAL SPINE WITHOUT CONTRAST TECHNIQUE: Multidetector CT imaging of the head and cervical spine was performed following the standard protocol without intravenous contrast. Multiplanar CT image reconstructions of the cervical spine were also generated. COMPARISON:  02/12/2018 and prior head CTs FINDINGS: CT HEAD FINDINGS Brain: No evidence of acute infarction, hemorrhage, hydrocephalus, extra-axial collection or mass lesion/mass effect. A remote LEFT caudate lacunar infarct is noted. Vascular: Carotid atherosclerotic calcifications noted. Skull: Normal. Negative for fracture or focal lesion. Sinuses/Orbits: No acute finding. A small RIGHT mastoid effusion is again identified. Other: None. CT CERVICAL SPINE FINDINGS Alignment: Normal. Skull base and vertebrae: No acute fracture. No primary bone lesion or focal pathologic process. Soft tissues and spinal canal: No prevertebral fluid or swelling. No visible canal hematoma. Disc levels: Mild-to-moderate degenerative disc disease/spondylosis at C5-6 and C6-7 noted. Upper chest: No acute abnormality Other: None IMPRESSION: 1. No evidence of acute intracranial abnormality. Remote LEFT caudate infarct. 2. No static evidence of acute injury to the cervical spine. Mild to moderate degenerative disc disease at C5-6 and C6-7. Electronically  Signed   By: Margarette Canada M.D.   On: 06/02/2018 15:13    Procedures Procedures (including  critical care time)  Medications Ordered in ED Medications - No data to display   Initial Impression / Assessment and Plan / ED Course  I have reviewed the triage vital signs and the nursing notes.  Pertinent labs & imaging results that were available during my care of the patient were reviewed by me and considered in my medical decision making (see chart for details).    62yF with headache and neck pain after fall from seizure. Nonfocal neuro exam. Likely contusion/strain. Imaging w/o significant abnormality. Plan symptomatic tx (pt declined meds in the ED).    Final Clinical Impressions(s) / ED Diagnoses   Final diagnoses:  Neck pain  Nonintractable headache, unspecified chronicity pattern, unspecified headache type    ED Discharge Orders    None       Virgel Manifold, MD 06/02/18 1530

## 2018-06-02 NOTE — Progress Notes (Signed)
Virtual Visit via telephone Note Due to COVID-19, visit is conducted virtually and was requested by patient. This visit type was conducted due to national recommendations for restrictions regarding the COVID-19 Pandemic (e.g. social distancing) in an effort to limit this patient's exposure and mitigate transmission in our community.  Due to her co-morbid illnesses, this patient is at least at moderate risk for complications without adequate follow up.  This format is felt to be most appropriate for this patient at this time.  All issues noted in this document were discussed and addressed.  A physical exam was not performed with this format.   I connected with Courtney Mcconnell on 06/02/18 at 1240 by telephone and verified that I am speaking with the correct person using two identifiers. Courtney Mcconnell is currently located at home and family is currently with them during visit. The provider, Monia Pouch, FNP is located in their office at time of visit.  I discussed the limitations, risks, security and privacy concerns of performing an evaluation and management service by telephone and the availability of in person appointments. I also discussed with the patient that there may be a patient responsible charge related to this service. The patient expressed understanding and agreed to proceed.  Subjective:  Patient ID: Courtney Mcconnell, female    DOB: 04-27-1955, 63 y.o.   MRN: [id number]  Chief Complaint:  No chief complaint on file.   HPI: Courtney Mcconnell is a 63 y.o. female presenting on 06/02/2018 for No chief complaint on file.   Pt reports she had a seizure on Saturday. This was unwitnessed. She is unsure how long the seizure lasted. She states she did get injured due to a fall during the seizure. Pt states she has ongoing neck pain, headaches, and visual disturbances. Pt states she is unsure what she hit during the fall. States she has bruises and scrapes to her hands and arms. Pt states she has had  intermittent blurred vision since the seizure and a headache for 2 days. She denies confusion or unilateral weakness. States she is compliant with her seizure medications. Denies fever, chills, cough, or shortness of breath.    Relevant past medical, surgical, family, and social history reviewed and updated as indicated.  Allergies and medications reviewed and updated.   Past Medical History:  Diagnosis Date  . Asthma   . Bursitis of left hip    right  . COPD (chronic obstructive pulmonary disease) ([place])   . GERD (gastroesophageal reflux disease)   . Headache(784.0)   . Hypertension   . Pneumonia   . Seizure ([place])    most recent 12/12/14  . Sleep apnea     Past Surgical History:  Procedure Laterality Date  . ABDOMINAL HYSTERECTOMY    . APPENDECTOMY    . CATARACT EXTRACTION Bilateral   . CHOLECYSTECTOMY    . heal spur     right  . LASER PHOTO ABLATION Right 08/27/2013   Procedure: LASER PHOTO ABLATION;  Surgeon: Hayden Pedro, MD;  Location: [place];  Service: Ophthalmology;  Laterality: Right;  Headscope laser AND Endolaser  . MEMBRANE PEEL Right 08/27/2013   Procedure: MEMBRANE PEEL;  Surgeon: Hayden Pedro, MD;  Location: [place];  Service: Ophthalmology;  Laterality: Right;  . PARS PLANA VITRECTOMY Right 08/27/2013   Procedure: PARS PLANA VITRECTOMY WITH 25 GAUGE;  Surgeon: Hayden Pedro, MD;  Location: [employer];  Service: Ophthalmology;  Laterality: Right;    Social History  Socioeconomic History  . Marital status: Married    Spouse name: [name]  . Number of children: 2  . Years of education: GED  . Highest education level: Not on file  Occupational History  . Occupation: Airline pilot: [employer]  Social Needs  . Financial resource strain: Not on file  . Food insecurity:    Worry: Not on file    Inability: Not on file  . Transportation needs:    Medical: Not on file    Non-medical: Not on file  Tobacco Use  . Smoking status: Former Smoker     Packs/day: 0.25    Years: 42.00    Pack years: 10.50    Types: Cigarettes    Last attempt to quit: 03/20/2014    Years since quitting: 4.2  . Smokeless tobacco: Never Used  . Tobacco comment: 01/21/15 restarted smoking 4 mos ago  Substance and Sexual Activity  . Alcohol use: No    Alcohol/week: 0.0 standard drinks    Frequency: Never  . Drug use: No  . Sexual activity: Not Currently  Lifestyle  . Physical activity:    Days per week: Not on file    Minutes per session: Not on file  . Stress: Not on file  Relationships  . Social connections:    Talks on phone: Not on file    Gets together: Not on file    Attends religious service: Not on file    Active member of club or organization: Not on file    Attends meetings of clubs or organizations: Not on file    Relationship status: Not on file  . Intimate partner violence:    Fear of current or ex partner: Not on file    Emotionally abused: Not on file    Physically abused: Not on file    Forced sexual activity: Not on file  Other Topics Concern  . Not on file  Social History Narrative   Patient lives at home with family.   Caffeine Use: 1 pot and a half a day    Outpatient Encounter Medications as of 06/02/2018  Medication Sig  . albuterol (VENTOLIN HFA) 108 (90 Base) MCG/ACT inhaler INHALE TWO PUFFS INTO THE LUNGS EVERY 6 HOURS AS NEEDED FOR WHEEZING OR SHORTNESS OF BREATH  . aspirin-sod bicarb-citric acid (ALKA-SELTZER) 325 MG TBEF tablet Take 325 mg by mouth every 6 (six) hours as needed (acid reflux).  . ciprofloxacin-hydrocortisone (CIPRO HC) OTIC suspension Place 3 drops into the left ear 2 (two) times daily.  . clindamycin (CLEOCIN) 300 MG capsule Take 1 capsule (300 mg total) by mouth every 6 (six) hours.  . fluticasone (FLONASE) 50 MCG/ACT nasal spray PLACE 2 SPRAYS INTO BOTH NOSTRILS DAILY. (Patient taking differently: Place 2 sprays into both nostrils at bedtime. )  . neomycin-colistin-hydrocortisone-thonzonium  (CORTISPORIN-TC) 3.04-14-08-0.5 MG/ML OTIC suspension Place 3 drops into the left ear 4 (four) times daily.  . nitroGLYCERIN (NITROSTAT) 0.4 MG SL tablet Place 1 tablet (0.4 mg total) under the tongue every 5 (five) minutes as needed for chest pain.  Marland Kitchen omeprazole (PRILOSEC) 20 MG capsule Take 20 mg by mouth 2 (two) times daily before a meal.   . tiotropium (SPIRIVA HANDIHALER) 18 MCG inhalation capsule inhale the contents of one capsule in the handihaler once daily (Patient taking differently: Place 18 mcg into inhaler and inhale every morning. )  . VIMPAT 100 MG TABS Take 300 mg by mouth 2 (two) times daily.  Marland Kitchen zonisamide (  ZONEGRAN) 100 MG capsule Take 100 mg by mouth daily.    No facility-administered encounter medications on file as of 06/02/2018.     Allergies  Allergen Reactions  . Bee Venom Swelling  . Amoxicillin-Pot Clavulanate Rash  . Doxycycline Hyclate Rash    Rash and itching    Review of Systems  Constitutional: Negative for activity change, chills, fatigue and fever.  Eyes: Positive for visual disturbance. Negative for photophobia.  Respiratory: Negative for cough and shortness of breath.   Cardiovascular: Negative for chest pain and palpitations.  Musculoskeletal: Positive for neck pain and neck stiffness.  Skin: Positive for wound.  Neurological: Positive for seizures and headaches. Negative for dizziness, tremors, syncope, facial asymmetry, speech difficulty, weakness, light-headedness and numbness.  Psychiatric/Behavioral: Negative for confusion.  All other systems reviewed and are negative.        Observations/Objective: No vital signs or physical exam, this was a telephone or virtual health encounter.  Pt alert and oriented, answers all questions appropriately, and able to speak in full sentences.    Assessment and Plan: Diagnoses and all orders for this visit:  Seizure disorder ([place]) Due to ongoing symptoms several days post seizure, it is recommended the  pt go to the ED for further evaluation and imaging. Spoke with Legrand Como, RN, at [facility] ED. Nurse aware pt will be coming in for evaluation.     Follow Up Instructions: Return if symptoms worsen or fail to improve.    I discussed the assessment and treatment plan with the patient. The patient was provided an opportunity to ask questions and all were answered. The patient agreed with the plan and demonstrated an understanding of the instructions.   The patient was advised to call back or seek an in-person evaluation if the symptoms worsen or if the condition fails to improve as anticipated.  The above assessment and management plan was discussed with the patient. The patient verbalized understanding of and has agreed to the management plan. Patient is aware to call the clinic if symptoms persist or worsen. Patient is aware when to return to the clinic for a follow-up visit. Patient educated on when it is appropriate to go to the emergency department.    I provided 15 minutes of non-face-to-face time during this encounter. The call started at 1240. The call ended at 1255.   Monia Pouch, FNP-C [facility] [address] [place], [place] [address] [phone]

## 2018-06-10 ENCOUNTER — Other Ambulatory Visit: Payer: Self-pay | Admitting: Family Medicine

## 2018-06-10 DIAGNOSIS — J209 Acute bronchitis, unspecified: Secondary | ICD-10-CM

## 2018-06-10 DIAGNOSIS — J440 Chronic obstructive pulmonary disease with acute lower respiratory infection: Secondary | ICD-10-CM

## 2018-06-17 ENCOUNTER — Telehealth: Payer: Self-pay | Admitting: Family Medicine

## 2018-06-27 DIAGNOSIS — G40209 Localization-related (focal) (partial) symptomatic epilepsy and epileptic syndromes with complex partial seizures, not intractable, without status epilepticus: Secondary | ICD-10-CM | POA: Diagnosis not present

## 2018-06-27 DIAGNOSIS — Z8673 Personal history of transient ischemic attack (TIA), and cerebral infarction without residual deficits: Secondary | ICD-10-CM | POA: Insufficient documentation

## 2018-06-27 DIAGNOSIS — G4730 Sleep apnea, unspecified: Secondary | ICD-10-CM | POA: Diagnosis not present

## 2018-07-30 ENCOUNTER — Other Ambulatory Visit: Payer: Self-pay | Admitting: Family Medicine

## 2018-07-30 MED ORDER — NITROGLYCERIN 0.4 MG SL SUBL: 0.4000 mg | SUBLINGUAL_TABLET | SUBLINGUAL | 5 refills | Status: DC | PRN

## 2018-07-30 NOTE — Telephone Encounter (Signed)
What is the name of the medication? Nitroglycerin  Have you contacted your pharmacy to request a refill? Yes  Which pharmacy would you like this sent to? [facility]   Patient notified that their request is being sent to the clinical staff for review and that they should receive a call once it is complete. If they do not receive a call within 24 hours they can check with their pharmacy or our office.

## 2018-08-15 ENCOUNTER — Emergency Department (HOSPITAL_COMMUNITY)
Admission: EM | Admit: 2018-08-15 | Discharge: 2018-08-15 | Disposition: A | Discharge status: Home / Self Care | Payer: Medicare Other | Attending: Emergency Medicine | Admitting: Emergency Medicine

## 2018-08-15 ENCOUNTER — Encounter (HOSPITAL_COMMUNITY): Payer: Self-pay | Admitting: Emergency Medicine

## 2018-08-15 ENCOUNTER — Other Ambulatory Visit: Payer: Self-pay

## 2018-08-15 DIAGNOSIS — I10 Essential (primary) hypertension: Secondary | ICD-10-CM | POA: Insufficient documentation

## 2018-08-15 DIAGNOSIS — R21 Rash and other nonspecific skin eruption: Secondary | ICD-10-CM | POA: Insufficient documentation

## 2018-08-15 DIAGNOSIS — Z87891 Personal history of nicotine dependence: Secondary | ICD-10-CM | POA: Insufficient documentation

## 2018-08-15 DIAGNOSIS — J45909 Unspecified asthma, uncomplicated: Secondary | ICD-10-CM | POA: Insufficient documentation

## 2018-08-15 DIAGNOSIS — J449 Chronic obstructive pulmonary disease, unspecified: Secondary | ICD-10-CM | POA: Insufficient documentation

## 2018-08-15 DIAGNOSIS — Z79899 Other long term (current) drug therapy: Secondary | ICD-10-CM | POA: Insufficient documentation

## 2018-08-15 MED ORDER — VALACYCLOVIR HCL 1 G PO TABS: 1000.0000 mg | ORAL_TABLET | Freq: Three times a day (TID) | ORAL | 0 refills | Status: AC

## 2018-08-15 MED ORDER — TRAMADOL HCL 50 MG PO TABS: 50.0000 mg | ORAL_TABLET | Freq: Three times a day (TID) | ORAL | 0 refills | Status: DC | PRN

## 2018-08-15 NOTE — ED Provider Notes (Signed)
  Face-to-face evaluation   History: He complains of an itchy and painful rash, beneath her breast, left, which started 2 days ago.  She also has a reddened sore area on her left trapezius region.  It has been present for 2 days.  She denies fever, chills, nausea or vomiting.  Physical exam: Alert elderly female.  She is obese.  Rash beneath the left breast is consistent with vesicles on a red base, indicating shingles.  There are also some raised areas which are slightly pigmented, which are indistinct, and not vesicular.  The patient is unclear if these have been there previously or not.  The area of her left trapezius where she is uncomfortable, has indistinct, non-confluent redness.  There are no other skin abnormalities at this location.  Medical screening examination/treatment/procedure(s) were conducted as a shared visit with non-physician practitioner(s) and myself.  I personally evaluated the patient during the encounter    Daleen Bo, MD 08/19/18 1556

## 2018-08-15 NOTE — ED Notes (Signed)
Pt unable to sign   Chart is accessed at another site

## 2018-08-15 NOTE — Discharge Instructions (Addendum)
Take valtrex as prescribed. Take the entire course. This is a treatment for Shingles Use ibuprofen and tylenol as needed for mild to moderate pain. Use tramadol as needed for severe or breakthrough pain. Have caution, this is a narcotic medication. Do not drive while taking this medicine.  It is important that you follow up with a dermatologist (skin doctor) for further evaluation and management. There is information for 2 different doctors listed below. You may also ask your primary care doctor to refer you. Return to the ER with any new, worsening, or concerning symptoms.

## 2018-08-15 NOTE — ED Triage Notes (Signed)
Patient with rash under her L breast, onset yesterday.

## 2018-08-15 NOTE — ED Provider Notes (Signed)
[facility] EMERGENCY DEPARTMENT Provider Note   CSN: [id number] Arrival date & time: 08/15/18  1831    History   Chief Complaint Chief Complaint  Patient presents with  . Rash    HPI Courtney Mcconnell is a 63 y.o. female presenting for evaluation of rash.   Pt states yesterday she notices an irritate red spot under her L breast. Since then, the rash has spread, become more painful ans itchy, she has been putting lotion on it, which helps the itching. She also reports an area of discomfort on her L neck. She denies sxs on the R side. She denies fevers, chills, n/v, abd pain, back pain. She denies contacts with similar sxs. She denies recent medication changes. She denies new soaps, shampoos, close, environments, detergents.  She denies pain before she noticed the redness.     HPI  Past Medical History:  Diagnosis Date  . Asthma   . Bursitis of left hip    right  . COPD (chronic obstructive pulmonary disease) ([place])   . GERD (gastroesophageal reflux disease)   . Headache(784.0)   . Hypertension   . Pneumonia   . Seizure ([place])    most recent 12/12/14  . Sleep apnea     Patient Active Problem List   Diagnosis Date Noted  . Cellulitis 04/12/2018  . Cellulitis of left upper arm   . Pulmonary nodules 12/08/2015  . 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 Sleep apnea 04/06/2015  . Pulmonary emphysema ([place]) 03/02/2015  . Smoker 02/16/2015  . Seizure disorder ([place]) 01/21/2015  . Focal epilepsy with impairment of consciousness ([place]) 12/13/2014  . Seizures ([place]) 04/18/2014  . HTN (hypertension) 04/18/2014  . 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 Tobacco use disorder 07/19/2012    Past Surgical History:  Procedure Laterality Date  . ABDOMINAL HYSTERECTOMY    . APPENDECTOMY    . CATARACT EXTRACTION Bilateral   . CHOLECYSTECTOMY    . heal spur     right  . LASER PHOTO ABLATION Right 08/27/2013   Procedure: LASER PHOTO ABLATION;  Surgeon: Hayden Pedro, MD;   Location: [place];  Service: Ophthalmology;  Laterality: Right;  Headscope laser AND Endolaser  . MEMBRANE PEEL Right 08/27/2013   Procedure: MEMBRANE PEEL;  Surgeon: Hayden Pedro, MD;  Location: [place];  Service: Ophthalmology;  Laterality: Right;  . PARS PLANA VITRECTOMY Right 08/27/2013   Procedure: PARS PLANA VITRECTOMY WITH 25 GAUGE;  Surgeon: Hayden Pedro, MD;  Location: [place];  Service: Ophthalmology;  Laterality: Right;     OB History    Gravida  2   Para  2   Term  2   Preterm      AB      Living  3     SAB      TAB      Ectopic      Multiple      Live Births               Home Medications    Prior to Admission medications   Medication Sig Start Date End Date Taking? Authorizing Provider  albuterol (VENTOLIN HFA) 108 (90 Base) MCG/ACT inhaler INHALE TWO PUFFS INTO THE LUNGS EVERY 6 HOURS AS NEEDED FOR WHEEZING OR SHORTNESS OF BREATH 06/02/18   Dettinger, Fransisca Kaufmann, MD  aspirin-sod bicarb-citric acid (ALKA-SELTZER) 325 MG TBEF tablet Take 325 mg by mouth every 6 (six) hours as needed (acid reflux).    [provider]  ciprofloxacin-hydrocortisone (CIPRO HC) OTIC suspension Place 3 drops into the left ear 2 (two) times daily. Patient not taking: Reported on 06/02/2018 05/13/18   Evelina Dun A, FNP  neomycin-colistin-hydrocortisone-thonzonium (CORTISPORIN-TC) 3.04-14-08-0.5 MG/ML OTIC suspension Place 3 drops into the left ear 4 (four) times daily. Patient not taking: Reported on 06/02/2018 05/13/18   Dettinger, Fransisca Kaufmann, MD  nitroGLYCERIN (NITROSTAT) 0.4 MG SL tablet Place 1 tablet (0.4 mg total) under the tongue every 5 (five) minutes as needed for chest pain. 07/30/18   Dettinger, Fransisca Kaufmann, MD  tiotropium (SPIRIVA HANDIHALER) 18 MCG inhalation capsule INHALE THE CONTENTS OF ONE CAPSULE IN THE HANDIHALER ONCE DAILY 06/10/18   Dettinger, Fransisca Kaufmann, MD  traMADol (ULTRAM) 50 MG tablet Take 1 tablet (50 mg total) by mouth every 8 (eight) hours as needed. 08/15/18    Myah Guynes, PA-C  valACYclovir (VALTREX) 1000 MG tablet Take 1 tablet (1,000 mg total) by mouth 3 (three) times daily for 7 days. 08/15/18 08/22/18  Jenille Laszlo, PA-C  VIMPAT 100 MG TABS Take 300 mg by mouth 2 (two) times daily. 03/03/18   [provider]  zonisamide (ZONEGRAN) 100 MG capsule Take 100 mg by mouth daily.  06/13/17   [provider]    Family History Family History  Problem Relation Age of Onset  . Diabetes Father   . Hypertension Father   . Hyperlipidemia Father   . COPD Mother   . Diabetes Brother   . Stroke Paternal Grandmother   . Cancer Sister   . Cancer Paternal Uncle   . Colon cancer Neg Hx     Social History Social History   Tobacco Use  . Smoking status: Former Smoker    Packs/day: 0.25    Years: 42.00    Pack years: 10.50    Types: Cigarettes    Quit date: 03/20/2014    Years since quitting: 4.4  . Smokeless tobacco: Never Used  . Tobacco comment: 01/21/15 restarted smoking 4 mos ago  Substance Use Topics  . Alcohol use: No    Alcohol/week: 0.0 standard drinks    Frequency: Never  . Drug use: No     Allergies   Bee venom, Amoxicillin-pot clavulanate, and Doxycycline hyclate   Review of Systems Review of Systems  Constitutional: Negative for fever.  Skin: Positive for rash.     Physical Exam Updated Vital Signs BP (!) 159/93 (BP Location: Right Arm)   Pulse 66   Temp 97.9 F (36.6 C) (Oral)   Resp 16   Ht 5\' 6"  (1.676 m)   Wt 108.9 kg   SpO2 94%   BMI 38.74 kg/m   Physical Exam Vitals signs and nursing note reviewed.  Constitutional:      General: She is not in acute distress.    Appearance: She is well-developed.  HENT:     Head: Normocephalic and atraumatic.  Neck:     Musculoskeletal: Normal range of motion.  Pulmonary:     Effort: Pulmonary effort is normal.  Abdominal:     General: There is no distension.  Musculoskeletal: Normal range of motion.  Skin:    General: Skin is warm.      Capillary Refill: Capillary refill takes less than 2 seconds.     Findings: Rash present.     Comments: Patient seen in conjunction with Dr. Durene Fruits, who was also present as chaperone.  Atypical rash noted under the left breast.  Areas of the rash are vesicular and consistent with possible  shingles.  Rash does not extend towards the back. Small, quarter sized, area of erythema of the left posterior lateral neck.  No vesicles, blister, bulla.  Neurological:     Mental Status: She is alert and oriented to person, place, and time.      ED Treatments / Results  Labs (all labs ordered are listed, but only abnormal results are displayed) Labs Reviewed - No data to display  EKG None  Radiology No results found.  Procedures Procedures (including critical care time)  Medications Ordered in ED Medications - No data to display   Initial Impression / Assessment and Plan / ED Course  I have reviewed the triage vital signs and the nursing notes.  Pertinent labs & imaging results that were available during my care of the patient were reviewed by me and considered in my medical decision making (see chart for details).        Patient presenting for evaluation of rash.  Physical exam shows patient appears nontoxic.  Rash is atypical, however does have vesicles and is unilateral.  As such, will treat for possible herpes, although rash is not classic for shingles.  Discussed findings with patient.  Discussed treatment with Valtrex, and close follow-up with dermatology.  Dr. Eulis Foster evaluated the patient and agrees to plan.  Recommend treat with Valtrex and tramadol. PMP checked, pt without concerning controlled substance rx use. Aat this time, patient received a discharge.  Return precautions given.  Patient states she understands and agrees to plan.  Final Clinical Impressions(s) / ED Diagnoses   Final diagnoses:  Rash and nonspecific skin eruption    ED Discharge Orders         Ordered     valACYclovir (VALTREX) 1000 MG tablet  3 times daily     08/15/18 1903    traMADol (ULTRAM) 50 MG tablet  Every 8 hours PRN     08/15/18 1903           Franchot Heidelberg, PA-C 08/15/18 2024    Daleen Bo, MD 08/19/18 1556

## 2018-08-31 DIAGNOSIS — Z880 Allergy status to penicillin: Secondary | ICD-10-CM | POA: Diagnosis not present

## 2018-08-31 DIAGNOSIS — M940 Chondrocostal junction syndrome [Tietze]: Secondary | ICD-10-CM | POA: Diagnosis not present

## 2018-08-31 DIAGNOSIS — Z881 Allergy status to other antibiotic agents status: Secondary | ICD-10-CM | POA: Diagnosis not present

## 2018-08-31 DIAGNOSIS — Z91030 Bee allergy status: Secondary | ICD-10-CM | POA: Diagnosis not present

## 2018-08-31 DIAGNOSIS — Z6839 Body mass index (BMI) 39.0-39.9, adult: Secondary | ICD-10-CM | POA: Diagnosis not present

## 2018-08-31 DIAGNOSIS — Z79899 Other long term (current) drug therapy: Secondary | ICD-10-CM | POA: Diagnosis not present

## 2018-08-31 DIAGNOSIS — R079 Chest pain, unspecified: Secondary | ICD-10-CM | POA: Diagnosis not present

## 2018-08-31 DIAGNOSIS — J449 Chronic obstructive pulmonary disease, unspecified: Secondary | ICD-10-CM | POA: Diagnosis not present

## 2018-09-26 ENCOUNTER — Other Ambulatory Visit: Payer: Self-pay | Admitting: Family Medicine

## 2018-09-26 DIAGNOSIS — J440 Chronic obstructive pulmonary disease with acute lower respiratory infection: Secondary | ICD-10-CM

## 2018-09-26 DIAGNOSIS — J209 Acute bronchitis, unspecified: Secondary | ICD-10-CM

## 2018-09-29 DIAGNOSIS — G4730 Sleep apnea, unspecified: Secondary | ICD-10-CM | POA: Diagnosis not present

## 2018-09-29 DIAGNOSIS — G40209 Localization-related (focal) (partial) symptomatic epilepsy and epileptic syndromes with complex partial seizures, not intractable, without status epilepticus: Secondary | ICD-10-CM | POA: Diagnosis not present

## 2018-11-22 ENCOUNTER — Other Ambulatory Visit: Payer: Self-pay | Admitting: Family Medicine

## 2018-11-22 DIAGNOSIS — J440 Chronic obstructive pulmonary disease with acute lower respiratory infection: Secondary | ICD-10-CM

## 2018-11-22 DIAGNOSIS — J209 Acute bronchitis, unspecified: Secondary | ICD-10-CM

## 2019-01-23 ENCOUNTER — Other Ambulatory Visit: Payer: Self-pay | Admitting: Family Medicine

## 2019-01-23 DIAGNOSIS — J209 Acute bronchitis, unspecified: Secondary | ICD-10-CM

## 2019-01-23 DIAGNOSIS — J440 Chronic obstructive pulmonary disease with acute lower respiratory infection: Secondary | ICD-10-CM

## 2019-02-10 ENCOUNTER — Inpatient Hospital Stay (HOSPITAL_COMMUNITY)
Admission: EM | Admit: 2019-02-10 | Discharge: 2019-02-12 | DRG: 247 | Disposition: A | Discharge status: Home / Self Care | Payer: Medicare Other | Attending: Cardiology | Admitting: Cardiology

## 2019-02-10 ENCOUNTER — Emergency Department (HOSPITAL_COMMUNITY): Payer: Medicare Other

## 2019-02-10 DIAGNOSIS — I25110 Atherosclerotic heart disease of native coronary artery with unstable angina pectoris: Secondary | ICD-10-CM | POA: Diagnosis not present

## 2019-02-10 DIAGNOSIS — J439 Emphysema, unspecified: Secondary | ICD-10-CM | POA: Diagnosis not present

## 2019-02-10 DIAGNOSIS — Z8701 Personal history of pneumonia (recurrent): Secondary | ICD-10-CM | POA: Diagnosis not present

## 2019-02-10 DIAGNOSIS — Z881 Allergy status to other antibiotic agents status: Secondary | ICD-10-CM

## 2019-02-10 DIAGNOSIS — Z20828 Contact with and (suspected) exposure to other viral communicable diseases: Secondary | ICD-10-CM | POA: Diagnosis present

## 2019-02-10 DIAGNOSIS — K219 Gastro-esophageal reflux disease without esophagitis: Secondary | ICD-10-CM | POA: Diagnosis present

## 2019-02-10 DIAGNOSIS — I214 Non-ST elevation (NSTEMI) myocardial infarction: Secondary | ICD-10-CM | POA: Diagnosis not present

## 2019-02-10 DIAGNOSIS — I200 Unstable angina: Secondary | ICD-10-CM | POA: Diagnosis not present

## 2019-02-10 DIAGNOSIS — I2510 Atherosclerotic heart disease of native coronary artery without angina pectoris: Secondary | ICD-10-CM | POA: Diagnosis present

## 2019-02-10 DIAGNOSIS — G4730 Sleep apnea, unspecified: Secondary | ICD-10-CM | POA: Diagnosis not present

## 2019-02-10 DIAGNOSIS — I252 Old myocardial infarction: Secondary | ICD-10-CM | POA: Diagnosis present

## 2019-02-10 DIAGNOSIS — E782 Mixed hyperlipidemia: Secondary | ICD-10-CM | POA: Diagnosis not present

## 2019-02-10 DIAGNOSIS — R569 Unspecified convulsions: Secondary | ICD-10-CM

## 2019-02-10 DIAGNOSIS — Z955 Presence of coronary angioplasty implant and graft: Secondary | ICD-10-CM

## 2019-02-10 DIAGNOSIS — E876 Hypokalemia: Secondary | ICD-10-CM | POA: Diagnosis not present

## 2019-02-10 DIAGNOSIS — E785 Hyperlipidemia, unspecified: Secondary | ICD-10-CM | POA: Diagnosis not present

## 2019-02-10 DIAGNOSIS — Z880 Allergy status to penicillin: Secondary | ICD-10-CM | POA: Diagnosis not present

## 2019-02-10 DIAGNOSIS — Z8249 Family history of ischemic heart disease and other diseases of the circulatory system: Secondary | ICD-10-CM | POA: Diagnosis not present

## 2019-02-10 DIAGNOSIS — Z79899 Other long term (current) drug therapy: Secondary | ICD-10-CM

## 2019-02-10 DIAGNOSIS — Z049 Encounter for examination and observation for unspecified reason: Secondary | ICD-10-CM | POA: Diagnosis not present

## 2019-02-10 DIAGNOSIS — Z91030 Bee allergy status: Secondary | ICD-10-CM

## 2019-02-10 DIAGNOSIS — Z825 Family history of asthma and other chronic lower respiratory diseases: Secondary | ICD-10-CM

## 2019-02-10 DIAGNOSIS — G40909 Epilepsy, unspecified, not intractable, without status epilepticus: Secondary | ICD-10-CM | POA: Diagnosis not present

## 2019-02-10 DIAGNOSIS — Z87891 Personal history of nicotine dependence: Secondary | ICD-10-CM

## 2019-02-10 DIAGNOSIS — I10 Essential (primary) hypertension: Secondary | ICD-10-CM | POA: Diagnosis not present

## 2019-02-10 DIAGNOSIS — Z7982 Long term (current) use of aspirin: Secondary | ICD-10-CM | POA: Diagnosis not present

## 2019-02-10 DIAGNOSIS — R079 Chest pain, unspecified: Secondary | ICD-10-CM | POA: Diagnosis not present

## 2019-02-10 HISTORY — DX: Hyperlipidemia, unspecified: E78.5

## 2019-02-10 LAB — BASIC METABOLIC PANEL
Anion gap: 10 (ref 5–15)
BUN: 8 mg/dL (ref 8–23)
CO2: 24 mmol/L (ref 22–32)
Calcium: 8.8 mg/dL — ABNORMAL LOW (ref 8.9–10.3)
Chloride: 103 mmol/L (ref 98–111)
Creatinine, Ser: 0.91 mg/dL (ref 0.44–1.00)
GFR calc Af Amer: >60 mL/min (ref >60)
GFR calc non Af Amer: >60 mL/min (ref >60)
Glucose, Bld: 126 mg/dL — ABNORMAL HIGH (ref 70–99)
Potassium: 3.9 mmol/L (ref 3.5–5.1)
Sodium: 137 mmol/L (ref 135–145)

## 2019-02-10 LAB — CBC
HCT: 42.8 % (ref 36.0–46.0)
Hemoglobin: 13.5 g/dL (ref 12.0–15.0)
MCH: 31.7 pg (ref 26.0–34.0)
MCHC: 31.5 g/dL (ref 30.0–36.0)
MCV: 100.5 fL — ABNORMAL HIGH (ref 80.0–100.0)
Platelets: 255 10*3/uL (ref 150–400)
RBC: 4.26 MIL/uL (ref 3.87–5.11)
RDW: 13.3 % (ref 11.5–15.5)
WBC: 7.9 10*3/uL (ref 4.0–10.5)
nRBC: 0 % (ref 0.0–0.2)

## 2019-02-10 LAB — TROPONIN I (HIGH SENSITIVITY)
Troponin I (High Sensitivity): 15 ng/L (ref <18)
Troponin I (High Sensitivity): 205 ng/L (ref <18)

## 2019-02-10 MED ORDER — ZONISAMIDE 100 MG PO CAPS
100.0000 mg | ORAL_CAPSULE | Freq: Every day | ORAL | Status: DC
  Administered 2019-02-11 – 2019-02-12 (×2): 100 mg via ORAL
  Filled 2019-02-10 (×2): qty 1

## 2019-02-10 MED ORDER — ASPIRIN 300 MG RE SUPP: 300.0000 mg | RECTAL | Status: DC

## 2019-02-10 MED ORDER — ASPIRIN 81 MG PO CHEW: 324.0000 mg | CHEWABLE_TABLET | ORAL | Status: DC

## 2019-02-10 MED ORDER — NITROGLYCERIN 0.4 MG SL SUBL
0.4000 mg | SUBLINGUAL_TABLET | SUBLINGUAL | Status: DC | PRN
  Administered 2019-02-10: 0.4 mg via SUBLINGUAL
  Filled 2019-02-10: qty 1

## 2019-02-10 MED ORDER — ASPIRIN EC 81 MG PO TBEC: 81.0000 mg | DELAYED_RELEASE_TABLET | Freq: Every day | ORAL | Status: DC

## 2019-02-10 MED ORDER — HEPARIN BOLUS VIA INFUSION
4000.0000 [IU] | Freq: Once | INTRAVENOUS | Status: AC
  Administered 2019-02-11: 4000 [IU] via INTRAVENOUS
  Filled 2019-02-10: qty 4000

## 2019-02-10 MED ORDER — NITROGLYCERIN IN D5W 200-5 MCG/ML-% IV SOLN
0.0000 ug/min | INTRAVENOUS | Status: DC
  Administered 2019-02-11: 5 ug/min via INTRAVENOUS
  Filled 2019-02-10: qty 250

## 2019-02-10 MED ORDER — ACETAMINOPHEN 325 MG PO TABS: 650.0000 mg | ORAL_TABLET | ORAL | Status: DC | PRN

## 2019-02-10 MED ORDER — HEPARIN (PORCINE) 25000 UT/250ML-% IV SOLN
1300.0000 [IU]/h | INTRAVENOUS | Status: DC
  Administered 2019-02-11: 1200 [IU]/h via INTRAVENOUS
  Filled 2019-02-10: qty 250

## 2019-02-10 MED ORDER — ATORVASTATIN CALCIUM 40 MG PO TABS
40.0000 mg | ORAL_TABLET | Freq: Every day | ORAL | Status: DC
  Administered 2019-02-11: 40 mg via ORAL
  Filled 2019-02-10: qty 1

## 2019-02-10 MED ORDER — UMECLIDINIUM BROMIDE 62.5 MCG/INH IN AEPB
1.0000 | INHALATION_SPRAY | Freq: Every day | RESPIRATORY_TRACT | Status: DC
  Administered 2019-02-11 – 2019-02-12 (×2): 1 via RESPIRATORY_TRACT
  Filled 2019-02-10: qty 7

## 2019-02-10 MED ORDER — LACOSAMIDE 50 MG PO TABS
300.0000 mg | ORAL_TABLET | Freq: Two times a day (BID) | ORAL | Status: DC
  Administered 2019-02-11 – 2019-02-12 (×3): 300 mg via ORAL
  Filled 2019-02-10 (×3): qty 6

## 2019-02-10 MED ORDER — ALBUTEROL SULFATE (2.5 MG/3ML) 0.083% IN NEBU: 2.5000 mL | INHALATION_SOLUTION | Freq: Four times a day (QID) | RESPIRATORY_TRACT | Status: DC | PRN

## 2019-02-10 MED ORDER — SODIUM CHLORIDE 0.9% FLUSH: 3.0000 mL | Freq: Once | INTRAVENOUS | Status: DC

## 2019-02-10 NOTE — ED Notes (Signed)
22:25 Notified Charge Rn that Pt had left. Charge RN said she or our AD would call Pt to come back.

## 2019-02-10 NOTE — ED Notes (Signed)
Alycia Rossetti (Granddaughter) [phone] wondering pts status. This NT informed her that she was not allowed to visit due to visitation policy.

## 2019-02-10 NOTE — ED Notes (Signed)
Patient on the way back to ER with daughter.

## 2019-02-10 NOTE — ED Provider Notes (Signed)
[facility] EMERGENCY DEPARTMENT Provider Note   CSN: [id number] Arrival date & time: 02/10/19  1601     History No chief complaint on file.   Courtney Mcconnell is a 63 y.o. female.  The history is provided by the patient.  Chest Pain Pain location:  L chest Pain quality comment:  Heaviness Pain radiates to:  Does not radiate Pain severity:  Severe Onset quality:  Sudden Timing:  Constant Progression:  Improving Chronicity:  New Relieved by:  Aspirin and nitroglycerin Worsened by:  Nothing Associated symptoms: cough, shortness of breath and weakness   Associated symptoms: no abdominal pain, no diaphoresis, no fever, no syncope and no vomiting   Patient with history of COPD, GERD, seizures presents with chest pain.  She reports sudden onset of left-sided chest heaviness at approximately 1400 today She reported feeling short of breath and mild weakness.  She does not recall having this pain before.  No pleuritic pain.  The pain did not radiate. She called ambulance, was given aspirin nitroglycerin with some improvement in her pain but she still has some pain at present Denies known history of CAD.     Past Medical History:  Diagnosis Date  . Asthma   . Bursitis of left hip    right  . COPD (chronic obstructive pulmonary disease) ([place])   . GERD (gastroesophageal reflux disease)   . Headache(784.0)   . Hypertension   . Pneumonia   . Seizure ([place])    most recent 12/12/14  . Sleep apnea     Patient Active Problem List   Diagnosis Date Noted  . Cellulitis 04/12/2018  . Cellulitis of left upper arm   . Pulmonary nodules 12/08/2015  . 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 Sleep apnea 04/06/2015  . Pulmonary emphysema ([place]) 03/02/2015  . Smoker 02/16/2015  . Seizure disorder ([place]) 01/21/2015  . Focal epilepsy with impairment of consciousness ([place]) 12/13/2014  . Seizures ([place]) 04/18/2014  . HTN (hypertension) 04/18/2014  . 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 Tobacco use disorder 07/19/2012    Past Surgical History:  Procedure Laterality Date  . ABDOMINAL HYSTERECTOMY    . APPENDECTOMY    . CATARACT EXTRACTION Bilateral   . CHOLECYSTECTOMY    . heal spur     right  . LASER PHOTO ABLATION Right 08/27/2013   Procedure: LASER PHOTO ABLATION;  Surgeon: Hayden Pedro, MD;  Location: [facility];  Service: Ophthalmology;  Laterality: Right;  Headscope laser AND Endolaser  . MEMBRANE PEEL Right 08/27/2013   Procedure: MEMBRANE PEEL;  Surgeon: Hayden Pedro, MD;  Location: [facility];  Service: Ophthalmology;  Laterality: Right;  . PARS PLANA VITRECTOMY Right 08/27/2013   Procedure: PARS PLANA VITRECTOMY WITH 25 GAUGE;  Surgeon: Hayden Pedro, MD;  Location: [facility];  Service: Ophthalmology;  Laterality: Right;     OB History    Gravida  2   Para  2   Term  2   Preterm      AB      Living  3     SAB      TAB      Ectopic      Multiple      Live Births              Family History  Problem Relation Age of Onset  . Diabetes Father   . Hypertension Father   . Hyperlipidemia Father   . COPD Mother   .  Diabetes Brother   . Stroke Paternal Grandmother   . Cancer Sister   . Cancer Paternal Uncle   . Colon cancer Neg Hx     Social History   Tobacco Use  . Smoking status: Former Smoker    Packs/day: 0.25    Years: 42.00    Pack years: 10.50    Types: Cigarettes    Quit date: 03/20/2014    Years since quitting: 4.8  . Smokeless tobacco: Never Used  . Tobacco comment: 01/21/15 restarted smoking 4 mos ago  Substance Use Topics  . Alcohol use: No    Alcohol/week: 0.0 standard drinks  . Drug use: No    Home Medications Prior to Admission medications   Medication Sig Start Date End Date Taking? Authorizing Provider  albuterol (VENTOLIN HFA) 108 (90 Base) MCG/ACT inhaler INHALE TWO PUFFS INTO THE LUNGS EVERY 6 HOURS AS NEEDED FOR WHEEZING OR SHORTNESS OF BREATH 06/02/18   Dettinger, Fransisca Kaufmann,  MD  aspirin-sod bicarb-citric acid (ALKA-SELTZER) 325 MG TBEF tablet Take 325 mg by mouth every 6 (six) hours as needed (acid reflux).    [provider]  nitroGLYCERIN (NITROSTAT) 0.4 MG SL tablet Place 1 tablet (0.4 mg total) under the tongue every 5 (five) minutes as needed for chest pain. 07/30/18   Dettinger, Fransisca Kaufmann, MD  SPIRIVA HANDIHALER 18 MCG inhalation capsule INHALE THE CONTENTS OF ONE CAPSULE IN THE HANDIHALER ONCE DAILY 01/23/19   Dettinger, Fransisca Kaufmann, MD  VIMPAT 100 MG TABS Take 300 mg by mouth 2 (two) times daily. 03/03/18   [provider]  zonisamide (ZONEGRAN) 100 MG capsule Take 100 mg by mouth daily.  06/13/17   [provider]    Allergies    Bee venom, Amoxicillin-pot clavulanate, and Doxycycline hyclate  Review of Systems   Review of Systems  Constitutional: Negative for diaphoresis and fever.  Respiratory: Positive for cough and shortness of breath.   Cardiovascular: Positive for chest pain. Negative for syncope.  Gastrointestinal: Negative for abdominal pain, blood in stool and vomiting.  Neurological: Positive for weakness. Negative for syncope.  All other systems reviewed and are negative.   Physical Exam Updated Vital Signs BP (!) 160/87   Pulse 71   Temp 98.1 F (36.7 C) (Oral)   Resp 15   Ht 1.651 m (5\' 5" )   Wt 107 kg   SpO2 99%   BMI 39.25 kg/m   Physical Exam CONSTITUTIONAL: Well developed/well nourished HEAD: Normocephalic/atraumatic EYES: EOMI ENMT: Mask in place NECK: supple no meningeal signs SPINE/BACK:entire spine nontender CV: S1/S2 noted, no murmurs/rubs/gallops noted LUNGS: Lungs are clear to auscultation bilaterally, no apparent distress ABDOMEN: soft, nontender, no rebound or guarding, bowel sounds noted throughout abdomen GU:no cva tenderness NEURO: Pt is awake/alert/appropriate, moves all extremitiesx4.  No facial droop.  No arm or leg drift EXTREMITIES: pulses normal/equalx4, full ROM SKIN: warm,  color normal PSYCH: no abnormalities of mood noted, alert and oriented to situation  ED Results / Procedures / Treatments   Labs (all labs ordered are listed, but only abnormal results are displayed) Labs Reviewed  BASIC METABOLIC PANEL - Abnormal; Notable for the following components:      Result Value   Glucose, Bld 126 (*)    Calcium 8.8 (*)    All other components within normal limits  CBC - Abnormal; Notable for the following components:   MCV 100.5 (*)    All other components within normal limits  TROPONIN I (HIGH SENSITIVITY) - Abnormal; Notable  for the following components:   Troponin I (High Sensitivity) 205 (*)    All other components within normal limits  SARS CORONAVIRUS 2 (TAT 6-24 HRS)  TROPONIN I (HIGH SENSITIVITY)    EKG EKG Interpretation  Date/Time:  Tuesday February 10 2019 16:04:11 EST Ventricular Rate:  61 PR Interval:  192 QRS Duration: 98 QT Interval:  432 QTC Calculation: 434 R Axis:   -13 Text Interpretation: Normal sinus rhythm Minimal voltage criteria for LVH, may be normal variant ( Cornell product ) Borderline ECG No significant change since last tracing Confirmed by Ripley Fraise [phone]) on 02/10/2019 11:15:47 PM   Radiology DG Chest 2 View  Result Date: 02/10/2019 CLINICAL DATA:  Chest pain EXAM: CHEST - 2 VIEW COMPARISON:  August 31, 2018 FINDINGS: There is no edema or consolidation. Heart is slightly enlarged with pulmonary vascularity normal. No adenopathy. No pneumothorax. No bone lesions. Surgical clips are noted in the right upper abdomen. IMPRESSION: Mild cardiac enlargement. No edema or consolidation. No adenopathy. Electronically Signed   By: Lowella Grip III M.D.   On: 02/10/2019 16:33    Procedures .Critical Care Performed by: Ripley Fraise, MD Authorized by: Ripley Fraise, MD   Critical care provider statement:    Critical care time (minutes):  38   Critical care start time:  02/10/2019 11:45 PM   Critical care  end time:  02/11/2019 12:23 AM   Critical care time was exclusive of:  Separately billable procedures and treating other patients   Critical care was necessary to treat or prevent imminent or life-threatening deterioration of the following conditions:  Cardiac failure and circulatory failure   Critical care was time spent personally by me on the following activities:  Ordering and review of radiographic studies, ordering and review of laboratory studies, pulse oximetry, re-evaluation of patient's condition, discussions with consultants, evaluation of patient's response to treatment and development of treatment plan with patient or surrogate   I assumed direction of critical care for this patient from another provider in my specialty: no       Medications Ordered in ED Medications  sodium chloride flush (NS) 0.9 % injection 3 mL (3 mLs Intravenous Not Given 02/10/19 2354)  heparin ADULT infusion 100 units/mL (25000 units/234mL sodium chloride 0.45%) (1,200 Units/hr Intravenous New Bag/Given 02/11/19 0021)  aspirin chewable tablet 324 mg (324 mg Oral Not Given 02/10/19 2356)    Or  aspirin suppository 300 mg ( Rectal See Alternative 02/10/19 2356)  aspirin EC tablet 81 mg (has no administration in time range)  acetaminophen (TYLENOL) tablet 650 mg (has no administration in time range)  nitroGLYCERIN 50 mg in dextrose 5 % 250 mL (0.2 mg/mL) infusion (has no administration in time range)  atorvastatin (LIPITOR) tablet 40 mg (has no administration in time range)  Lacosamide TABS 300 mg (has no administration in time range)  zonisamide (ZONEGRAN) capsule 100 mg (has no administration in time range)  tiotropium (SPIRIVA) inhalation capsule ([facility] use ONLY) 18 mcg (has no administration in time range)  albuterol (VENTOLIN HFA) 108 (90 Base) MCG/ACT inhaler 1 puff (has no administration in time range)  heparin bolus via infusion 4,000 Units (4,000 Units Intravenous Bolus from Bag 02/11/19 0021)    ED  Course  I have reviewed the triage vital signs and the nursing notes.  Pertinent labs & imaging results that were available during my care of the patient were reviewed by me and considered in my medical decision making (see chart for details).  MDM Rules/Calculators/A&P                      11:54 PM Patient presented with sudden onset of left-sided chest heaviness and weakness. Patient reports some improvement with nitroglycerin that was given earlier, but she does have pain at this time around a 4/10 Nitroglycerin has been ordered. Patient's troponin is starting to increase, and given history of chest pain, my concern for unstable angina/non-STEMI.  We will also order heparin(patient denies any recent blood loss or blood in her stool)  I updated her children via the phone.   Discussed the case with patient, told her she may need a cardiac catheterization 11:57 PM  EKG Interpretation  Date/Time:  Tuesday February 10 2019 23:49:52 EST Ventricular Rate:  66 PR Interval:  192 QRS Duration: 104 QT Interval:  410 QTC Calculation: 430 R Axis:   -35 Text Interpretation: Sinus rhythm Consider left atrial enlargement RSR' in V1 or V2, right VCD or RVH Left ventricular hypertrophy Abnrm T, consider ischemia, anterolateral lds new t wave changes noted Confirmed by Ripley Fraise [phone]) on 02/10/2019 11:55:13 PM      Repeat EKG at 2349 does reveal new T wave changes. Plan will be to start a nitroglycerin drip to get her pain-free.  Discussed the case with cardiology Dr. Marletta Lor who will admit the patient 12:24 AM Patient is now chest pain-free.  She is getting admitted  To the hospital, likely cardiac cath in the morning Final Clinical Impression(s) / ED Diagnoses Final diagnoses:  Unstable angina [facility])    Rx / DC Orders ED Discharge Orders    None       Ripley Fraise, MD 02/11/19 [phone]

## 2019-02-10 NOTE — ED Notes (Signed)
Pt arrived back to ER. Pt in waiting room.

## 2019-02-10 NOTE — ED Triage Notes (Signed)
Pt here from home with c/o chest pain left side radiating down the left arm , no n/v , some sob . Pt was given 6 ntro by ems along with 324mg  asa , no pain on arrival

## 2019-02-10 NOTE — ED Notes (Signed)
Pt requested IV to be taken out because of waiting room wait. This tech recommended pt to stay due to pt's symptoms. Pt was adamant to leave and said it's too late in the night. This tech told to come back if symptoms continue. Tech removed IV and pt's daughter picked her up.

## 2019-02-10 NOTE — Progress Notes (Signed)
ANTICOAGULATION CONSULT NOTE - Initial Consult  Pharmacy Consult for heparin Indication: chest pain/ACS  Allergies  Allergen Reactions  . Bee Venom Swelling  . Amoxicillin-Pot Clavulanate Rash  . Doxycycline Hyclate Rash    Rash and itching    Patient Measurements: Height: 5\' 5"  (165.1 cm) Weight: 235 lb 14.3 oz (107 kg) IBW/kg (Calculated) : 57 Heparin Dosing Weight: 85kg  Vital Signs: Temp: 98.1 F (36.7 C) (12/29 2259) Temp Source: Oral (12/29 2259) BP: 160/87 (12/29 2328) Pulse Rate: 71 (12/29 2329)  Labs: Recent Labs    02/10/19 1617 02/10/19 2015  HGB 13.5  --   HCT 42.8  --   PLT 255  --   CREATININE 0.91  --   TROPONINIHS 15 205*    Estimated Creatinine Clearance: 76.9 mL/min (by C-G formula based on SCr of 0.91 mg/dL).   Medical History: Past Medical History:  Diagnosis Date  . Asthma   . Bursitis of left hip    right  . COPD (chronic obstructive pulmonary disease) ([place])   . GERD (gastroesophageal reflux disease)   . Headache(784.0)   . Hypertension   . Pneumonia   . Seizure ([place])    most recent 12/12/14  . Sleep apnea     Assessment: 63yo female c/o left-sided CP radiating to LUE and associated with some SOB, pain relieved with NTG x6, initial troponin negative but now increasing (15>205), to begin heparin.  Goal of Therapy:  Heparin level 0.3-0.7 units/ml Monitor platelets by anticoagulation protocol: Yes   Plan:  Heparin 4000 units IV bolus x1 followed by gtt at 1200 units/hr and monitor heparin levels and CBC.  Wynona Neat, PharmD, BCPS  02/10/2019,11:45 PM

## 2019-02-10 NOTE — ED Notes (Signed)
Vickii Chafe (Daughter# [phone]) called for an update.

## 2019-02-10 NOTE — ED Notes (Addendum)
Attempted multiple times to call the patient by triage nurse but unable to reach the patient using her mobile and  Home phone ( no answer) to update result of elevated Troponin  and advise her to return to ER  . Patient LWBS.

## 2019-02-11 ENCOUNTER — Inpatient Hospital Stay (HOSPITAL_COMMUNITY): Payer: Medicare Other

## 2019-02-11 ENCOUNTER — Other Ambulatory Visit: Payer: Self-pay

## 2019-02-11 ENCOUNTER — Encounter (HOSPITAL_COMMUNITY)
Admission: EM | Disposition: A | Discharge status: Home / Self Care | Payer: Self-pay | Source: Home / Self Care | Attending: Cardiology

## 2019-02-11 ENCOUNTER — Encounter (HOSPITAL_COMMUNITY): Payer: Self-pay | Admitting: Cardiology

## 2019-02-11 DIAGNOSIS — I2510 Atherosclerotic heart disease of native coronary artery without angina pectoris: Secondary | ICD-10-CM

## 2019-02-11 DIAGNOSIS — I10 Essential (primary) hypertension: Secondary | ICD-10-CM

## 2019-02-11 DIAGNOSIS — I214 Non-ST elevation (NSTEMI) myocardial infarction: Principal | ICD-10-CM

## 2019-02-11 DIAGNOSIS — E785 Hyperlipidemia, unspecified: Secondary | ICD-10-CM

## 2019-02-11 DIAGNOSIS — Z955 Presence of coronary angioplasty implant and graft: Secondary | ICD-10-CM | POA: Insufficient documentation

## 2019-02-11 DIAGNOSIS — R079 Chest pain, unspecified: Secondary | ICD-10-CM

## 2019-02-11 HISTORY — DX: Non-ST elevation (NSTEMI) myocardial infarction: I21.4

## 2019-02-11 HISTORY — PX: LEFT HEART CATH AND CORONARY ANGIOGRAPHY: CATH118249

## 2019-02-11 HISTORY — PX: INTRAVASCULAR PRESSURE WIRE/FFR STUDY: CATH118243

## 2019-02-11 HISTORY — DX: Atherosclerotic heart disease of native coronary artery without angina pectoris: I25.10

## 2019-02-11 HISTORY — PX: CORONARY STENT INTERVENTION: CATH118234

## 2019-02-11 LAB — CBC
HCT: 41.9 % (ref 36.0–46.0)
Hemoglobin: 14.4 g/dL (ref 12.0–15.0)
MCH: 32.9 pg (ref 26.0–34.0)
MCHC: 34.4 g/dL (ref 30.0–36.0)
MCV: 95.7 fL (ref 80.0–100.0)
Platelets: 251 10*3/uL (ref 150–400)
RBC: 4.38 MIL/uL (ref 3.87–5.11)
RDW: 13.1 % (ref 11.5–15.5)
WBC: 10.2 10*3/uL (ref 4.0–10.5)
nRBC: 0 % (ref 0.0–0.2)

## 2019-02-11 LAB — LIPID PANEL
Cholesterol: 221 mg/dL — ABNORMAL HIGH (ref 0–200)
HDL: 56 mg/dL (ref >40)
LDL Cholesterol: 153 mg/dL — ABNORMAL HIGH (ref 0–99)
Total CHOL/HDL Ratio: 3.9 RATIO
Triglycerides: 59 mg/dL (ref <150)
VLDL: 12 mg/dL (ref 0–40)

## 2019-02-11 LAB — RESPIRATORY PANEL BY RT PCR (FLU A&B, COVID)
Influenza A by PCR: NEGATIVE
Influenza B by PCR: NEGATIVE
SARS Coronavirus 2 by RT PCR: NEGATIVE

## 2019-02-11 LAB — POCT ACTIVATED CLOTTING TIME
Activated Clotting Time: 252 seconds
Activated Clotting Time: 268 seconds

## 2019-02-11 LAB — BASIC METABOLIC PANEL
Anion gap: 11 (ref 5–15)
BUN: 7 mg/dL — ABNORMAL LOW (ref 8–23)
CO2: 21 mmol/L — ABNORMAL LOW (ref 22–32)
Calcium: 8.8 mg/dL — ABNORMAL LOW (ref 8.9–10.3)
Chloride: 106 mmol/L (ref 98–111)
Creatinine, Ser: 0.84 mg/dL (ref 0.44–1.00)
GFR calc Af Amer: >60 mL/min (ref >60)
GFR calc non Af Amer: >60 mL/min (ref >60)
Glucose, Bld: 110 mg/dL — ABNORMAL HIGH (ref 70–99)
Potassium: 3.4 mmol/L — ABNORMAL LOW (ref 3.5–5.1)
Sodium: 138 mmol/L (ref 135–145)

## 2019-02-11 LAB — BRAIN NATRIURETIC PEPTIDE: B Natriuretic Peptide: 77.4 pg/mL (ref 0.0–100.0)

## 2019-02-11 LAB — TSH: TSH: 3.299 u[IU]/mL (ref 0.350–4.500)

## 2019-02-11 LAB — ECHOCARDIOGRAM COMPLETE
Height: 66 in
Weight: 3791.91 oz

## 2019-02-11 LAB — HEPARIN LEVEL (UNFRACTIONATED): Heparin Unfractionated: 0.3 IU/mL (ref 0.30–0.70)

## 2019-02-11 LAB — SARS CORONAVIRUS 2 (TAT 6-24 HRS): SARS Coronavirus 2: NEGATIVE

## 2019-02-11 LAB — TROPONIN I (HIGH SENSITIVITY)
Troponin I (High Sensitivity): 849 ng/L (ref <18)
Troponin I (High Sensitivity): 952 ng/L (ref <18)

## 2019-02-11 LAB — HEMOGLOBIN A1C
Hgb A1c MFr Bld: 6 % — ABNORMAL HIGH (ref 4.8–5.6)
Mean Plasma Glucose: 125.5 mg/dL

## 2019-02-11 LAB — MRSA PCR SCREENING: MRSA by PCR: NEGATIVE

## 2019-02-11 LAB — HIV ANTIBODY (ROUTINE TESTING W REFLEX): HIV Screen 4th Generation wRfx: NONREACTIVE

## 2019-02-11 SURGERY — LEFT HEART CATH AND CORONARY ANGIOGRAPHY
Anesthesia: LOCAL

## 2019-02-11 MED ORDER — SODIUM CHLORIDE 0.9% FLUSH: 3.0000 mL | INTRAVENOUS | Status: DC | PRN

## 2019-02-11 MED ORDER — TICAGRELOR 90 MG PO TABS
ORAL_TABLET | ORAL | Status: AC
  Filled 2019-02-11: qty 2

## 2019-02-11 MED ORDER — FENTANYL CITRATE (PF) 100 MCG/2ML IJ SOLN
INTRAMUSCULAR | Status: AC
  Filled 2019-02-11: qty 2

## 2019-02-11 MED ORDER — SODIUM CHLORIDE 0.9 % WEIGHT BASED INFUSION
3.0000 mL/kg/h | INTRAVENOUS | Status: DC
  Administered 2019-02-11: 3 mL/kg/h via INTRAVENOUS

## 2019-02-11 MED ORDER — HEPARIN (PORCINE) IN NACL 1000-0.9 UT/500ML-% IV SOLN
INTRAVENOUS | Status: DC | PRN
  Administered 2019-02-11: 500 mL

## 2019-02-11 MED ORDER — SODIUM CHLORIDE 0.9 % IV SOLN: INTRAVENOUS | Status: AC

## 2019-02-11 MED ORDER — HYDRALAZINE HCL 25 MG PO TABS
25.0000 mg | ORAL_TABLET | Freq: Three times a day (TID) | ORAL | Status: DC
  Administered 2019-02-11 – 2019-02-12 (×3): 25 mg via ORAL
  Filled 2019-02-11 (×3): qty 1

## 2019-02-11 MED ORDER — MIDAZOLAM HCL 2 MG/2ML IJ SOLN
INTRAMUSCULAR | Status: AC
  Filled 2019-02-11: qty 2

## 2019-02-11 MED ORDER — FENTANYL CITRATE (PF) 100 MCG/2ML IJ SOLN
INTRAMUSCULAR | Status: DC | PRN
  Administered 2019-02-11: 50 ug via INTRAVENOUS

## 2019-02-11 MED ORDER — ONDANSETRON HCL 4 MG/2ML IJ SOLN
INTRAMUSCULAR | Status: AC
  Administered 2019-02-11: 4 mg via INTRAVENOUS
  Filled 2019-02-11: qty 2

## 2019-02-11 MED ORDER — LIDOCAINE HCL (PF) 1 % IJ SOLN
INTRAMUSCULAR | Status: DC | PRN
  Administered 2019-02-11: 2 mL

## 2019-02-11 MED ORDER — VERAPAMIL HCL 2.5 MG/ML IV SOLN
INTRAVENOUS | Status: AC
  Filled 2019-02-11: qty 2

## 2019-02-11 MED ORDER — IOHEXOL 350 MG/ML SOLN
INTRAVENOUS | Status: DC | PRN
  Administered 2019-02-11: 100 mL via INTRA_ARTERIAL

## 2019-02-11 MED ORDER — HEPARIN (PORCINE) IN NACL 1000-0.9 UT/500ML-% IV SOLN
INTRAVENOUS | Status: AC
  Filled 2019-02-11: qty 1000

## 2019-02-11 MED ORDER — NITROGLYCERIN 1 MG/10 ML FOR IR/CATH LAB
INTRA_ARTERIAL | Status: DC | PRN
  Administered 2019-02-11: 200 ug via INTRACORONARY

## 2019-02-11 MED ORDER — ASPIRIN 81 MG PO CHEW
CHEWABLE_TABLET | ORAL | Status: DC | PRN
  Administered 2019-02-11: 81 mg via ORAL

## 2019-02-11 MED ORDER — HEPARIN SODIUM (PORCINE) 1000 UNIT/ML IJ SOLN
INTRAMUSCULAR | Status: AC
  Filled 2019-02-11: qty 1

## 2019-02-11 MED ORDER — TICAGRELOR 90 MG PO TABS
ORAL_TABLET | ORAL | Status: DC | PRN
  Administered 2019-02-11: 180 mg via ORAL

## 2019-02-11 MED ORDER — SODIUM CHLORIDE 0.9 % IV SOLN: 250.0000 mL | INTRAVENOUS | Status: DC | PRN

## 2019-02-11 MED ORDER — ASPIRIN 81 MG PO CHEW
CHEWABLE_TABLET | ORAL | Status: AC
  Filled 2019-02-11: qty 1

## 2019-02-11 MED ORDER — HEPARIN SODIUM (PORCINE) 1000 UNIT/ML IJ SOLN
INTRAMUSCULAR | Status: DC | PRN
  Administered 2019-02-11: 5000 [IU] via INTRAVENOUS
  Administered 2019-02-11: 3000 [IU] via INTRAVENOUS
  Administered 2019-02-11: 5000 [IU] via INTRAVENOUS

## 2019-02-11 MED ORDER — ONDANSETRON HCL 4 MG/2ML IJ SOLN: 4.0000 mg | Freq: Four times a day (QID) | INTRAMUSCULAR | Status: DC | PRN

## 2019-02-11 MED ORDER — HYDRALAZINE HCL 20 MG/ML IJ SOLN
INTRAMUSCULAR | Status: AC
  Filled 2019-02-11: qty 1

## 2019-02-11 MED ORDER — LIDOCAINE HCL (PF) 1 % IJ SOLN
INTRAMUSCULAR | Status: AC
  Filled 2019-02-11: qty 30

## 2019-02-11 MED ORDER — MIDAZOLAM HCL 2 MG/2ML IJ SOLN
INTRAMUSCULAR | Status: DC | PRN
  Administered 2019-02-11: 2 mg via INTRAVENOUS

## 2019-02-11 MED ORDER — HYDRALAZINE HCL 20 MG/ML IJ SOLN: 10.0000 mg | INTRAMUSCULAR | Status: AC | PRN

## 2019-02-11 MED ORDER — VERAPAMIL HCL 2.5 MG/ML IV SOLN: INTRAVENOUS | Status: DC | PRN

## 2019-02-11 MED ORDER — HYDRALAZINE HCL 20 MG/ML IJ SOLN
INTRAMUSCULAR | Status: DC | PRN
  Administered 2019-02-11: 10 mg via INTRAVENOUS

## 2019-02-11 MED ORDER — NITROGLYCERIN 1 MG/10 ML FOR IR/CATH LAB
INTRA_ARTERIAL | Status: AC
  Filled 2019-02-11: qty 10

## 2019-02-11 MED ORDER — TICAGRELOR 90 MG PO TABS
90.0000 mg | ORAL_TABLET | Freq: Two times a day (BID) | ORAL | Status: DC
  Administered 2019-02-11 – 2019-02-12 (×2): 90 mg via ORAL
  Filled 2019-02-11 (×2): qty 1

## 2019-02-11 MED ORDER — ADENOSINE 12 MG/4ML IV SOLN
INTRAVENOUS | Status: AC
  Filled 2019-02-11: qty 16

## 2019-02-11 MED ORDER — SODIUM CHLORIDE 0.9% FLUSH
3.0000 mL | Freq: Two times a day (BID) | INTRAVENOUS | Status: DC
  Administered 2019-02-11 – 2019-02-12 (×2): 3 mL via INTRAVENOUS

## 2019-02-11 MED ORDER — ASPIRIN EC 81 MG PO TBEC
81.0000 mg | DELAYED_RELEASE_TABLET | Freq: Every day | ORAL | Status: DC
  Administered 2019-02-12: 81 mg via ORAL
  Filled 2019-02-11: qty 1

## 2019-02-11 MED ORDER — POTASSIUM CHLORIDE CRYS ER 20 MEQ PO TBCR
30.0000 meq | EXTENDED_RELEASE_TABLET | Freq: Once | ORAL | Status: AC
  Administered 2019-02-11: 30 meq via ORAL
  Filled 2019-02-11: qty 1

## 2019-02-11 MED ORDER — SODIUM CHLORIDE 0.9 % WEIGHT BASED INFUSION
1.0000 mL/kg/h | INTRAVENOUS | Status: DC
  Administered 2019-02-11: 1 mL/kg/h via INTRAVENOUS

## 2019-02-11 MED ORDER — SODIUM CHLORIDE 0.9% FLUSH
3.0000 mL | Freq: Two times a day (BID) | INTRAVENOUS | Status: DC
  Administered 2019-02-11 – 2019-02-12 (×3): 3 mL via INTRAVENOUS

## 2019-02-11 MED ORDER — LABETALOL HCL 5 MG/ML IV SOLN: 10.0000 mg | INTRAVENOUS | Status: AC | PRN

## 2019-02-11 SURGICAL SUPPLY — 18 items
BALLN EMERGE MR 2.0X20 (BALLOONS) ×2
BALLN ~~LOC~~ EUPHORA RX 2.75X20 (BALLOONS) ×2
BALLOON EMERGE MR 2.0X20 (BALLOONS) IMPLANT
BALLOON ~~LOC~~ EUPHORA RX 2.75X20 (BALLOONS) IMPLANT
CATH 5FR JL3.5 JR4 ANG PIG MP (CATHETERS) ×1 IMPLANT
CATH VISTA GUIDE 6FR XBLAD3.5 (CATHETERS) ×1 IMPLANT
DEVICE RAD COMP TR BAND LRG (VASCULAR PRODUCTS) ×1 IMPLANT
GLIDESHEATH SLEND SS 6F .021 (SHEATH) ×1 IMPLANT
GUIDEWIRE INQWIRE 1.5J.035X260 (WIRE) IMPLANT
GUIDEWIRE PRESSURE COMET II (WIRE) ×1 IMPLANT
INQWIRE 1.5J .035X260CM (WIRE) ×2
KIT ENCORE 26 ADVANTAGE (KITS) ×1 IMPLANT
KIT HEART LEFT (KITS) ×2 IMPLANT
PACK CARDIAC CATHETERIZATION (CUSTOM PROCEDURE TRAY) ×2 IMPLANT
STENT SYNERGY XD 2.50X28 (Permanent Stent) IMPLANT
SYNERGY XD 2.50X28 (Permanent Stent) ×2 IMPLANT
TRANSDUCER W/STOPCOCK (MISCELLANEOUS) ×2 IMPLANT
TUBING CIL FLEX 10 FLL-RA (TUBING) ×2 IMPLANT

## 2019-02-11 NOTE — Plan of Care (Signed)
  Problem: Education: Goal: Knowledge of General Education information will improve Description: Including pain rating scale, medication(s)/side effects and non-pharmacologic comfort measures 02/11/2019 1002 by Don Perking, RN Outcome: Progressing 02/11/2019 1001 by Don Perking, RN Outcome: Progressing   Problem: Health Behavior/Discharge Planning: Goal: Ability to manage health-related needs will improve 02/11/2019 1002 by Don Perking, RN Outcome: Progressing 02/11/2019 1001 by Don Perking, RN Outcome: Progressing   Problem: Clinical Measurements: Goal: Ability to maintain clinical measurements within normal limits will improve 02/11/2019 1002 by Don Perking, RN Outcome: Progressing 02/11/2019 1001 by Don Perking, RN Outcome: Progressing Goal: Will remain free from infection 02/11/2019 1002 by Don Perking, RN Outcome: Progressing 02/11/2019 1001 by Don Perking, RN Outcome: Progressing Goal: Diagnostic test results will improve 02/11/2019 1002 by Don Perking, RN Outcome: Progressing 02/11/2019 1001 by Don Perking, RN Outcome: Progressing Goal: Respiratory complications will improve 02/11/2019 1002 by Don Perking, RN Outcome: Progressing 02/11/2019 1001 by Don Perking, RN Outcome: Progressing Goal: Cardiovascular complication will be avoided 02/11/2019 1002 by Don Perking, RN Outcome: Progressing 02/11/2019 1001 by Don Perking, RN Outcome: Progressing   Problem: Activity: Goal: Risk for activity intolerance will decrease 02/11/2019 1002 by Don Perking, RN Outcome: Progressing 02/11/2019 1001 by Don Perking, RN Outcome: Progressing   Problem: Nutrition: Goal: Adequate nutrition will be maintained 02/11/2019 1002 by Don Perking, RN Outcome: Progressing 02/11/2019 1001 by Don Perking, RN Outcome: Progressing   Problem: Coping: Goal: Level  of anxiety will decrease 02/11/2019 1002 by Don Perking, RN Outcome: Progressing 02/11/2019 1001 by Don Perking, RN Outcome: Progressing   Problem: Elimination: Goal: Will not experience complications related to bowel motility 02/11/2019 1002 by Don Perking, RN Outcome: Progressing 02/11/2019 1001 by Don Perking, RN Outcome: Progressing Goal: Will not experience complications related to urinary retention 02/11/2019 1002 by Don Perking, RN Outcome: Progressing 02/11/2019 1001 by Don Perking, RN Outcome: Progressing   Problem: Pain Managment: Goal: General experience of comfort will improve 02/11/2019 1002 by Don Perking, RN Outcome: Progressing 02/11/2019 1001 by Don Perking, RN Outcome: Progressing   Problem: Safety: Goal: Ability to remain free from injury will improve 02/11/2019 1002 by Don Perking, RN Outcome: Progressing 02/11/2019 1001 by Don Perking, RN Outcome: Progressing   Problem: Skin Integrity: Goal: Risk for impaired skin integrity will decrease 02/11/2019 1002 by Don Perking, RN Outcome: Progressing 02/11/2019 1001 by Don Perking, RN Outcome: Progressing   Problem: Education: Goal: Understanding of CV disease, CV risk reduction, and recovery process will improve Outcome: Progressing Goal: Individualized Educational Video(s) Outcome: Progressing   Problem: Activity: Goal: Ability to return to baseline activity level will improve Outcome: Progressing   Problem: Cardiovascular: Goal: Ability to achieve and maintain adequate cardiovascular perfusion will improve Outcome: Progressing Goal: Vascular access site(s) Level 0-1 will be maintained Outcome: Progressing   Problem: Health Behavior/Discharge Planning: Goal: Ability to safely manage health-related needs after discharge will improve Outcome: Progressing

## 2019-02-11 NOTE — Plan of Care (Signed)

## 2019-02-11 NOTE — H&P (View-Only) (Signed)
Progress Note  Patient Name: Courtney Mcconnell Date of Encounter: 02/11/2019  Primary Cardiologist: No primary care provider on file.   Subjective   No chest pain overnight.   Inpatient Medications    Scheduled Meds: . aspirin  324 mg Oral NOW   Or  . aspirin  300 mg Rectal NOW  . aspirin EC  81 mg Oral Daily  . atorvastatin  40 mg Oral q1800  . lacosamide  300 mg Oral BID  . sodium chloride flush  3 mL Intravenous Once  . umeclidinium bromide  1 puff Inhalation Daily  . zonisamide  100 mg Oral Daily   Continuous Infusions: . heparin 1,200 Units/hr (02/11/19 0400)  . nitroGLYCERIN 15 mcg/min (02/11/19 0440)   PRN Meds: acetaminophen, albuterol   Vital Signs    Vitals:   02/11/19 0015 02/11/19 0048 02/11/19 0411 02/11/19 0730  BP: (!) 149/89 (!) 180/98 (!) 170/80 (!) 151/87  Pulse: 73 66 65 62  Resp: (!) 22 12 14 16   Temp:  97.6 F (36.4 C) 97.7 F (36.5 C) 97.8 F (36.6 C)  TempSrc:  Oral Oral Oral  SpO2: 99% 100% 98% 100%  Weight:  107.5 kg    Height:  5\' 6"  (1.676 m)      Intake/Output Summary (Last 24 hours) at 02/11/2019 0748 Last data filed at 02/11/2019 0400 Gross per 24 hour  Intake 90.73 ml  Output --  Net 90.73 ml   Last 3 Weights 02/11/2019 02/10/2019 08/15/2018  Weight (lbs) 236 lb 15.9 oz 235 lb 14.3 oz 240 lb  Weight (kg) 107.5 kg 107 kg 108.863 kg      Telemetry    SB - Personally Reviewed  ECG    No new tracing this morning.  Physical Exam  Older WF, sitting up in bed.  GEN: No acute distress.   Neck: No JVD Cardiac: RRR, no murmurs, rubs, or gallops.  Respiratory: Clear to auscultation bilaterally. GI: Soft, nontender, non-distended  MS: No edema; No deformity. Neuro:  Nonfocal  Psych: Normal affect   Labs    High Sensitivity Troponin:   Recent Labs  Lab 02/10/19 1617 02/10/19 2015 02/11/19 0029 02/11/19 0211  TROPONINIHS 15 205* 849* 952*      Chemistry Recent Labs  Lab 02/10/19 1617 02/11/19 0211  NA 137  138  K 3.9 3.4*  CL 103 106  CO2 24 21*  GLUCOSE 126* 110*  BUN 8 7*  CREATININE 0.91 0.84  CALCIUM 8.8* 8.8*  GFRNONAA >60 >60  GFRAA >60 >60  ANIONGAP 10 11     Hematology Recent Labs  Lab 02/10/19 1617 02/11/19 0211  WBC 7.9 10.2  RBC 4.26 4.38  HGB 13.5 14.4  HCT 42.8 41.9  MCV 100.5* 95.7  MCH 31.7 32.9  MCHC 31.5 34.4  RDW 13.3 13.1  PLT 255 251    BNP Recent Labs  Lab 02/11/19 0211  BNP 77.4     DDimer No results for input(s): DDIMER in the last 168 hours.   Radiology    DG Chest 2 View  Result Date: 02/10/2019 CLINICAL DATA:  Chest pain EXAM: CHEST - 2 VIEW COMPARISON:  August 31, 2018 FINDINGS: There is no edema or consolidation. Heart is slightly enlarged with pulmonary vascularity normal. No adenopathy. No pneumothorax. No bone lesions. Surgical clips are noted in the right upper abdomen. IMPRESSION: Mild cardiac enlargement. No edema or consolidation. No adenopathy. Electronically Signed   By: Lowella Grip III M.D.   On:  02/10/2019 16:33    Cardiac Studies   N/a   Patient Profile     63 y.o. female  former smoker with COPD and seizure disorder who presents with chest pain, found to have EKG changes and elevated troponin c/w NSTEMI.   Assessment & Plan    1. NSTEMI: hsTn rising, 952. Developed EKG changes with TWI in v3-v5 from initial EKG on admission. Does not appear that she follows regularly with a PCP. Long time smoker. Given findings have recommended that she undergo cardiac cath.  -- The patient understands that risks included but are not limited to stroke (1 in 1000), death (1 in 13), kidney failure [usually temporary] (1 in 500), bleeding (1 in 200), allergic reaction [possibly serious] (1 in 200).  -- remains on IV heparin and nitro. ASA and statin.   2. HL: LDL 153, started on high dose statin.   3. HTN: elevated, HR rates in 60s. Will hold on adding BB at this time. Consider ACEi/ARB post cath.   4. Seizure disorder:  continue home meds  5. COPD: stable with home meds  6. Hypokalemia: suppl  For questions or updates, please contact [place] Please consult [email or web address] for contact info under        Signed, Reino Bellis, NP  02/11/2019, 7:48 AM

## 2019-02-11 NOTE — Progress Notes (Signed)
Progress Note  Patient Name: Courtney Mcconnell Date of Encounter: 02/11/2019  Primary Cardiologist: No primary care provider on file.   Subjective   No chest pain overnight.   Inpatient Medications    Scheduled Meds: . aspirin  324 mg Oral NOW   Or  . aspirin  300 mg Rectal NOW  . aspirin EC  81 mg Oral Daily  . atorvastatin  40 mg Oral q1800  . lacosamide  300 mg Oral BID  . sodium chloride flush  3 mL Intravenous Once  . umeclidinium bromide  1 puff Inhalation Daily  . zonisamide  100 mg Oral Daily   Continuous Infusions: . heparin 1,200 Units/hr (02/11/19 0400)  . nitroGLYCERIN 15 mcg/min (02/11/19 0440)   PRN Meds: acetaminophen, albuterol   Vital Signs    Vitals:   02/11/19 0015 02/11/19 0048 02/11/19 0411 02/11/19 0730  BP: (!) 149/89 (!) 180/98 (!) 170/80 (!) 151/87  Pulse: 73 66 65 62  Resp: (!) 22 12 14 16   Temp:  97.6 F (36.4 C) 97.7 F (36.5 C) 97.8 F (36.6 C)  TempSrc:  Oral Oral Oral  SpO2: 99% 100% 98% 100%  Weight:  107.5 kg    Height:  5\' 6"  (1.676 m)      Intake/Output Summary (Last 24 hours) at 02/11/2019 0748 Last data filed at 02/11/2019 0400 Gross per 24 hour  Intake 90.73 ml  Output -  Net 90.73 ml   Last 3 Weights 02/11/2019 02/10/2019 08/15/2018  Weight (lbs) 236 lb 15.9 oz 235 lb 14.3 oz 240 lb  Weight (kg) 107.5 kg 107 kg 108.863 kg      Telemetry    SB - Personally Reviewed  ECG    No new tracing this morning.  Physical Exam  Older WF, sitting up in bed.  GEN: No acute distress.   Neck: No JVD Cardiac: RRR, no murmurs, rubs, or gallops.  Respiratory: Clear to auscultation bilaterally. GI: Soft, nontender, non-distended  MS: No edema; No deformity. Neuro:  Nonfocal  Psych: Normal affect   Labs    High Sensitivity Troponin:   Recent Labs  Lab 02/10/19 1617 02/10/19 2015 02/11/19 0029 02/11/19 0211  TROPONINIHS 15 205* 849* 952*      Chemistry Recent Labs  Lab 02/10/19 1617 02/11/19 0211  NA 137  138  K 3.9 3.4*  CL 103 106  CO2 24 21*  GLUCOSE 126* 110*  BUN 8 7*  CREATININE 0.91 0.84  CALCIUM 8.8* 8.8*  GFRNONAA >60 >60  GFRAA >60 >60  ANIONGAP 10 11     Hematology Recent Labs  Lab 02/10/19 1617 02/11/19 0211  WBC 7.9 10.2  RBC 4.26 4.38  HGB 13.5 14.4  HCT 42.8 41.9  MCV 100.5* 95.7  MCH 31.7 32.9  MCHC 31.5 34.4  RDW 13.3 13.1  PLT 255 251    BNP Recent Labs  Lab 02/11/19 0211  BNP 77.4     DDimer No results for input(s): DDIMER in the last 168 hours.   Radiology    DG Chest 2 View  Result Date: 02/10/2019 CLINICAL DATA:  Chest pain EXAM: CHEST - 2 VIEW COMPARISON:  August 31, 2018 FINDINGS: There is no edema or consolidation. Heart is slightly enlarged with pulmonary vascularity normal. No adenopathy. No pneumothorax. No bone lesions. Surgical clips are noted in the right upper abdomen. IMPRESSION: Mild cardiac enlargement. No edema or consolidation. No adenopathy. Electronically Signed   By: Lowella Grip III M.D.   On:  02/10/2019 16:33    Cardiac Studies   N/a   Patient Profile     62 y.o. female  former smoker with COPD and seizure disorder who presents with chest pain, found to have EKG changes and elevated troponin c/w NSTEMI.   Assessment & Plan    1. NSTEMI: hsTn rising, 952. Developed EKG changes with TWI in v3-v5 from initial EKG on admission. Does not appear that she follows regularly with a PCP. Long time smoker. Given findings have recommended that she undergo cardiac cath.  -- The patient understands that risks included but are not limited to stroke (1 in 1000), death (1 in 31), kidney failure [usually temporary] (1 in 500), bleeding (1 in 200), allergic reaction [possibly serious] (1 in 200).  -- remains on IV heparin and nitro. ASA and statin.   2. HL: LDL 153, started on high dose statin.   3. HTN: elevated, HR rates in 60s. Will hold on adding BB at this time. Consider ACEi/ARB post cath.   4. Seizure disorder:  continue home meds  5. COPD: stable with home meds  6. Hypokalemia: suppl  For questions or updates, please contact [place] Please consult [email or web address] for contact info under        Signed, Reino Bellis, NP  02/11/2019, 7:48 AM

## 2019-02-11 NOTE — Interval H&P Note (Signed)
History and Physical Interval Note:  02/11/2019 9:35 AM  Courtney Mcconnell  has presented today for surgery, with the diagnosis of n stemi.  The various methods of treatment have been discussed with the patient and family. After consideration of risks, benefits and other options for treatment, the patient has consented to  Procedure(s): LEFT HEART CATH AND CORONARY ANGIOGRAPHY (N/A) as a surgical intervention.  The patient's history has been reviewed, patient examined, no change in status, stable for surgery.  I have reviewed the patient's chart and labs.  Questions were answered to the patient's satisfaction.    Cath Lab Visit (complete for each Cath Lab visit)  Clinical Evaluation Leading to the Procedure:   ACS: Yes.    Non-ACS:    Anginal Classification: CCS IV  Anti-ischemic medical therapy: No Therapy  Non-Invasive Test Results: No non-invasive testing performed  Prior CABG: No previous CABG   Lauree Chandler

## 2019-02-11 NOTE — H&P (Signed)
Cardiology Admission History and Physical:   Patient ID: Courtney Mcconnell MRN: [id number]; DOB: [birth year]   Admission date: 02/10/2019  Primary Care Provider: Dettinger, Fransisca Kaufmann, MD Primary Cardiologist: No primary care provider on file.  Primary Electrophysiologist:  None   Chief Complaint:  Chest pain  Patient Profile:   Courtney Mcconnell is a 63 y.o. former smoker with COPD and seizure disorder who presents with chest pain, found to have EKG changes and elevated troponin c/w NSTEMI.   Courtney Mcconnell has not followed closely with her primary care doctor and has not previously been followed by cardiology. She was in her usual state of health - ambulatory, independent -  until 2PM today when she developed sudden onset chest pain while watching television. She describes it as sharp, pressure, - "like someone was sitting on my chest". No radiation. No associated diaphoresis or nausea. Has not experienced similar pain, even less severe bfore. EMS was activated and she was given 325 ASA and SLN by EMS with complete alleviation of pain.   On ROS, no fevers, chills, cough. Chronic, stable SOB from COPD. Intermittent pedal edema but no orthopnea.   Heart Pathway Score:     Past Medical History:  Diagnosis Date  . Asthma   . Bursitis of left hip    right  . COPD (chronic obstructive pulmonary disease) ([place])   . GERD (gastroesophageal reflux disease)   . Headache(784.0)   . Hypertension   . Pneumonia   . Seizure ([place])    most recent 12/12/14  . Sleep apnea     Past Surgical History:  Procedure Laterality Date  . ABDOMINAL HYSTERECTOMY    . APPENDECTOMY    . CATARACT EXTRACTION Bilateral   . CHOLECYSTECTOMY    . heal spur     right  . LASER PHOTO ABLATION Right 08/27/2013   Procedure: LASER PHOTO ABLATION;  Surgeon: Hayden Pedro, MD;  Location: [facility];  Service: Ophthalmology;  Laterality: Right;  Headscope laser AND Endolaser  . MEMBRANE PEEL Right 08/27/2013   Procedure: MEMBRANE  PEEL;  Surgeon: Hayden Pedro, MD;  Location: [facility];  Service: Ophthalmology;  Laterality: Right;  . PARS PLANA VITRECTOMY Right 08/27/2013   Procedure: PARS PLANA VITRECTOMY WITH 25 GAUGE;  Surgeon: Hayden Pedro, MD;  Location: [facility];  Service: Ophthalmology;  Laterality: Right;     Medications Prior to Admission: Prior to Admission medications   Medication Sig Start Date End Date Taking? Authorizing Provider  albuterol (VENTOLIN HFA) 108 (90 Base) MCG/ACT inhaler INHALE TWO PUFFS INTO THE LUNGS EVERY 6 HOURS AS NEEDED FOR WHEEZING OR SHORTNESS OF BREATH 06/02/18   Dettinger, Fransisca Kaufmann, MD  aspirin-sod bicarb-citric acid (ALKA-SELTZER) 325 MG TBEF tablet Take 325 mg by mouth every 6 (six) hours as needed (acid reflux).    [provider]  nitroGLYCERIN (NITROSTAT) 0.4 MG SL tablet Place 1 tablet (0.4 mg total) under the tongue every 5 (five) minutes as needed for chest pain. 07/30/18   Dettinger, Fransisca Kaufmann, MD  SPIRIVA HANDIHALER 18 MCG inhalation capsule INHALE THE CONTENTS OF ONE CAPSULE IN THE HANDIHALER ONCE DAILY 01/23/19   Dettinger, Fransisca Kaufmann, MD  VIMPAT 100 MG TABS Take 300 mg by mouth 2 (two) times daily. 03/03/18   [provider]  zonisamide (ZONEGRAN) 100 MG capsule Take 100 mg by mouth daily.  06/13/17   [provider]     Allergies:    Allergies  Allergen Reactions  . Bee Venom  Swelling  . Amoxicillin-Pot Clavulanate Rash  . Doxycycline Hyclate Rash    Rash and itching    Social History:   Social History   Socioeconomic History  . Marital status: Married    Spouse name: [name]  . Number of children: 2  . Years of education: GED  . Highest education level: Not on file  Occupational History  . Occupation: Airline pilot: [employer]  Tobacco Use  . Smoking status: Former Smoker    Packs/day: 0.25    Years: 42.00    Pack years: 10.50    Types: Cigarettes    Quit date: 03/20/2014    Years since quitting: 4.9  . Smokeless  tobacco: Never Used  . Tobacco comment: 01/21/15 restarted smoking 4 mos ago  Substance and Sexual Activity  . Alcohol use: No    Alcohol/week: 0.0 standard drinks  . Drug use: No  . Sexual activity: Not Currently  Other Topics Concern  . Not on file  Social History Narrative   Patient lives at home with family.   Caffeine Use: 1 pot and a half a day   Social Determinants of Health   Financial Resource Strain:   . Difficulty of Paying Living Expenses: Not on file  Food Insecurity:   . Worried About Charity fundraiser in the Last Year: Not on file  . Ran Out of Food in the Last Year: Not on file  Transportation Needs:   . Lack of Transportation (Medical): Not on file  . Lack of Transportation (Non-Medical): Not on file  Physical Activity:   . Days of Exercise per Week: Not on file  . Minutes of Exercise per Session: Not on file  Stress:   . Feeling of Stress : Not on file  Social Connections:   . Frequency of Communication with Friends and Family: Not on file  . Frequency of Social Gatherings with Friends and Family: Not on file  . Attends Religious Services: Not on file  . Active Member of Clubs or Organizations: Not on file  . Attends Archivist Meetings: Not on file  . Marital Status: Not on file  Intimate Partner Violence:   . Fear of Current or Ex-Partner: Not on file  . Emotionally Abused: Not on file  . Physically Abused: Not on file  . Sexually Abused: Not on file    Family History:   The patient's family history includes COPD in her mother; Cancer in her paternal uncle and sister; Diabetes in her brother and father; Hyperlipidemia in her father; Hypertension in her father; Stroke in her paternal grandmother. There is no history of Colon cancer.    Father with ?CABG in 50s-60s.   ROS:  Please see the history of present illness.  All other ROS reviewed and negative.     Physical Exam/Data:   Vitals:   02/10/19 2331 02/10/19 2345 02/11/19 0000  02/11/19 0015  BP:  (!) 166/84 (!) 151/89 (!) 149/89  Pulse:  68 86 73  Resp:  15 19 (!) 22  Temp:      TempSrc:      SpO2:  99% 98% 99%  Weight: 107 kg     Height: 5\' 5"  (1.651 m)      No intake or output data in the 24 hours ending 02/11/19 0032 Last 3 Weights 02/10/2019 08/15/2018 06/02/2018  Weight (lbs) 235 lb 14.3 oz 240 lb 240 lb  Weight (kg) 107 kg 108.863 kg 108.863 kg  Body mass index is 39.25 kg/m.  General:  Well nourished, well developed, in no acute distress HEENT: normal Neck: no JVD Endocrine:  No thryomegaly Vascular: No carotid bruits; FA pulses 2+ bilaterally without bruits  Cardiac:  normal S1, S2; RRR; no murmur.  Lungs:  clear to auscultation bilaterally, no wheezing, rhonchi or rales  Abd: soft, nontender, no hepatomegaly  Ext: no edema Musculoskeletal:  No deformities, BUE and BLE strength normal and equal Skin: warm and dry  Psych:  Normal affect    EKG:   Initial EKG 4PM 12/29: SR. Normal axis, normal interval. Some T wave flattening in anteroseptal leads. No ST changes.   EKG 11PM 12/29: SR. Normal axis, normal intervals. New T wave inversions in V3-V5 with subtle ST depressions in inferior leads.   Relevant CV Studies: n/a  Laboratory Data:  High Sensitivity Troponin:   Recent Labs  Lab 02/10/19 1617 02/10/19 2015  TROPONINIHS 15 205*      Chemistry Recent Labs  Lab 02/10/19 1617  NA 137  K 3.9  CL 103  CO2 24  GLUCOSE 126*  BUN 8  CREATININE 0.91  CALCIUM 8.8*  GFRNONAA >60  GFRAA >60  ANIONGAP 10    No results for input(s): PROT, ALBUMIN, AST, ALT, ALKPHOS, BILITOT in the last 168 hours. Hematology Recent Labs  Lab 02/10/19 1617  WBC 7.9  RBC 4.26  HGB 13.5  HCT 42.8  MCV 100.5*  MCH 31.7  MCHC 31.5  RDW 13.3  PLT 255   BNPNo results for input(s): BNP, PROBNP in the last 168 hours.  DDimer No results for input(s): DDIMER in the last 168 hours.   Radiology/Studies:  DG Chest 2 View  Result Date:  02/10/2019 CLINICAL DATA:  Chest pain EXAM: CHEST - 2 VIEW COMPARISON:  August 31, 2018 FINDINGS: There is no edema or consolidation. Heart is slightly enlarged with pulmonary vascularity normal. No adenopathy. No pneumothorax. No bone lesions. Surgical clips are noted in the right upper abdomen. IMPRESSION: Mild cardiac enlargement. No edema or consolidation. No adenopathy. Electronically Signed   By: Lowella Grip III M.D.   On: 02/10/2019 16:33       TIMI Risk Score for Unstable Angina or Non-ST Elevation MI:   The patient's TIMI risk score is 3, which indicates a 13% risk of all cause mortality, new or recurrent myocardial infarction or need for urgent revascularization in the next 14 days.   Assessment and Plan:   Courtney Mcconnell is a 63 year old former smoker with COPD who presents with chest pain found to have NSTEMI.   #NSTEMI -- Heparin gtt, ACS dosing -- Nitro gtt for SBP < 160.  -- ASA 324 administered, continue ASA 81mg  daily -- Complete TTE ordered -- NPO @ [facility] for coronary angiography -- Risk stratification with lipid panel, Hba1c -- Consult to cardiac rehab  #Seizure Disorder -- Continue home antiepileptics  #COPD -- Spiriva daily -- Albuterol PRN  Severity of Illness: The appropriate patient status for this patient is INPATIENT. Inpatient status is judged to be reasonable and necessary in order to provide the required intensity of service to ensure the patient's safety. The patient's presenting symptoms, physical exam findings, and initial radiographic and laboratory data in the context of their chronic comorbidities is felt to place them at high risk for further clinical deterioration. Furthermore, it is not anticipated that the patient will be medically stable for discharge from the hospital within 2 midnights of admission. The following factors support  the patient status of inpatient.   " The patient's presenting symptoms include chest pain. " The worrisome physical  exam findings include n/a " The initial radiographic and laboratory data are worrisome because of elevated troponins " The chronic co-morbidities include COPD   * I certify that at the point of admission it is my clinical judgment that the patient will require inpatient hospital care spanning beyond 2 midnights from the point of admission due to high intensity of service, high risk for further deterioration and high frequency of surveillance required.*  For questions or updates, please contact [place] Please consult [email or web address] for contact info under   Signed, Milus Banister, MD  02/11/2019 12:32 AM

## 2019-02-11 NOTE — Progress Notes (Signed)
[facility] for heparin Indication: chest pain/ACS  Allergies  Allergen Reactions  . Bee Venom Swelling  . Amoxicillin-Pot Clavulanate Rash  . Doxycycline Hyclate Rash    Rash and itching    Patient Measurements: Height: 5\' 6"  (167.6 cm) Weight: 236 lb 15.9 oz (107.5 kg) IBW/kg (Calculated) : 59.3 Heparin Dosing Weight: 85kg  Vital Signs: Temp: 97.7 F (36.5 C) (12/30 0411) Temp Source: Oral (12/30 0411) BP: 170/80 (12/30 0411) Pulse Rate: 65 (12/30 0411)  Labs: Recent Labs    02/10/19 1617 02/10/19 2015 02/11/19 0029 02/11/19 0211 02/11/19 0636  HGB 13.5  --   --  14.4  --   HCT 42.8  --   --  41.9  --   PLT 255  --   --  251  --   HEPARINUNFRC  --   --   --   --  0.30  CREATININE 0.91  --   --  0.84  --   TROPONINIHS 15 205* 849* 952*  --     Estimated Creatinine Clearance: 85.1 mL/min (by C-G formula based on SCr of 0.84 mg/dL).   Medical History: Past Medical History:  Diagnosis Date  . Asthma   . Bursitis of left hip    right  . COPD (chronic obstructive pulmonary disease) ([place])   . GERD (gastroesophageal reflux disease)   . Headache(784.0)   . Hypertension   . Pneumonia   . Seizure ([place])    most recent 12/12/14  . Sleep apnea     Assessment: 63yo female c/o left-sided CP radiating to LUE and associated with some SOB, pain relieved with NTG x6, initial troponin negative but now increasing IV:[id number]), to begin heparin.  Initial heparin level came back therapeutic at 0.3, on 1200 units/hr. Hgb 14.4, plt 251. No s/sx of bleeding or infusion issues.   Goal of Therapy:  Heparin level 0.3-0.7 units/ml Monitor platelets by anticoagulation protocol: Yes   Plan:  Increase heparin infusion to 1300 units/hr to keep in therapeutic range Monitor daily HL, CBC, and for s/sx of bleeding F/u cardiac plans  Antonietta Jewel, PharmD, BCCCP Clinical Pharmacist  Phone: [phone]  Please check AMION for all [facility]  phone numbers After 10:00 PM, call [place] [phone] 02/11/2019,7:27 AM

## 2019-02-11 NOTE — Progress Notes (Signed)
  Echocardiogram 2D Echocardiogram has been performed.  [name] 02/11/2019, 9:07 AM

## 2019-02-11 NOTE — Progress Notes (Signed)
Chaplain gave education around Advanced Directive to Klemme and her daughter Vickii Chafe.  Chaplain will follow-up tomorrow.

## 2019-02-11 NOTE — Progress Notes (Signed)
Chaplain was able to speak with Saide and give her Advanced Directive paperwork.  Chaplain will provide education about AD when daughter has arrived.  Chaplain told Dwanna to have nurse have her paged when she would like to go over paperwork together with daughter present.  Chaplain let Megann know that there is no notary present today.   Chaplain will follow-up.

## 2019-02-12 ENCOUNTER — Telehealth: Payer: Self-pay | Admitting: Cardiology

## 2019-02-12 ENCOUNTER — Encounter (HOSPITAL_COMMUNITY): Payer: Self-pay | Admitting: Cardiology

## 2019-02-12 DIAGNOSIS — E782 Mixed hyperlipidemia: Secondary | ICD-10-CM

## 2019-02-12 DIAGNOSIS — Z955 Presence of coronary angioplasty implant and graft: Secondary | ICD-10-CM

## 2019-02-12 DIAGNOSIS — I25110 Atherosclerotic heart disease of native coronary artery with unstable angina pectoris: Secondary | ICD-10-CM

## 2019-02-12 DIAGNOSIS — E785 Hyperlipidemia, unspecified: Secondary | ICD-10-CM

## 2019-02-12 LAB — CBC
HCT: 40.4 % (ref 36.0–46.0)
Hemoglobin: 13.5 g/dL (ref 12.0–15.0)
MCH: 32 pg (ref 26.0–34.0)
MCHC: 33.4 g/dL (ref 30.0–36.0)
MCV: 95.7 fL (ref 80.0–100.0)
Platelets: 245 10*3/uL (ref 150–400)
RBC: 4.22 MIL/uL (ref 3.87–5.11)
RDW: 13.5 % (ref 11.5–15.5)
WBC: 10 10*3/uL (ref 4.0–10.5)
nRBC: 0 % (ref 0.0–0.2)

## 2019-02-12 LAB — BASIC METABOLIC PANEL
Anion gap: 8 (ref 5–15)
BUN: 6 mg/dL — ABNORMAL LOW (ref 8–23)
CO2: 23 mmol/L (ref 22–32)
Calcium: 9.1 mg/dL (ref 8.9–10.3)
Chloride: 107 mmol/L (ref 98–111)
Creatinine, Ser: 0.8 mg/dL (ref 0.44–1.00)
GFR calc Af Amer: >60 mL/min (ref >60)
GFR calc non Af Amer: >60 mL/min (ref >60)
Glucose, Bld: 124 mg/dL — ABNORMAL HIGH (ref 70–99)
Potassium: 3.5 mmol/L (ref 3.5–5.1)
Sodium: 138 mmol/L (ref 135–145)

## 2019-02-12 MED ORDER — ATORVASTATIN CALCIUM 40 MG PO TABS: 40.0000 mg | ORAL_TABLET | Freq: Every day | ORAL | 1 refills | Status: DC

## 2019-02-12 MED ORDER — CARVEDILOL 3.125 MG PO TABS: 3.1250 mg | ORAL_TABLET | Freq: Two times a day (BID) | ORAL | 2 refills | Status: DC

## 2019-02-12 MED ORDER — TICAGRELOR 90 MG PO TABS: 90.0000 mg | ORAL_TABLET | Freq: Two times a day (BID) | ORAL | 2 refills | Status: DC

## 2019-02-12 MED ORDER — CARVEDILOL 3.125 MG PO TABS: 3.1250 mg | ORAL_TABLET | Freq: Two times a day (BID) | ORAL | Status: DC

## 2019-02-12 MED ORDER — ASPIRIN 81 MG PO TBEC: 81.0000 mg | DELAYED_RELEASE_TABLET | Freq: Every day | ORAL | 1 refills | Status: DC

## 2019-02-12 MED FILL — ATORVASTATIN CALCIUM 40 MG: 40 | 90 days supply | Qty: 90 | Fill #0

## 2019-02-12 MED FILL — CARVEDILOL 3.125 MG TABLET: 3.125 | 30 days supply | Qty: 60 | Fill #0

## 2019-02-12 MED FILL — BRILINTA 90 MG TABLET: 90 | 90 days supply | Qty: 180 | Fill #0

## 2019-02-12 MED FILL — ASPIRIN LOW DOSE 81 MG TBEC: 81 | 90 days supply | Qty: 90 | Fill #0

## 2019-02-12 NOTE — Telephone Encounter (Signed)
New Message  Per Mendel Ryder PA, scheduled [facility] hospital follow up visit with Jory Sims on 02/19/2019 at 9:30 am.

## 2019-02-12 NOTE — Care Management (Signed)
Per Barney Drain. Co-pay for Brinlinta 90 mg, bid zero co-pay for 30 day supply.  No PA required Tier 3 medication Retail pharnacy: [facility], [facility].

## 2019-02-12 NOTE — Progress Notes (Signed)
CARDIAC REHAB PHASE I   PRE:  Rate/Rhythm: 79 SR  BP:  Supine: 140/72  Sitting:   Standing:    SaO2: 99%RA  MODE:  Ambulation: 420 ft   POST:  Rate/Rhythm: 112 ST  BP:  Supine:   Sitting: 141/77  Standing:    SaO2: 97%RA 0920-1020 Pt walked 420 ft on RA with hand held asst. Gait fairly steady. Pt stated she is always a little wobbly since seizures. No CP. To sitting on side of bed after walk. MI education completed with pt who voiced understanding. Stressed importance of brilinta with stent. Reviewed MI restrictions, NTG use, heart healthy and low carb food choices. Discussed with pt that A1C at 6 and needs to watch carbs. Pt stated she eats potatoes almost every day and has at least one regular soft drink a day. Encouraged pt to decrease these. Congratulated pt on her quitting smoking for 3-4 years. Gave walking instructions for ex. Discussed CRP 2 and referred to [facility] program. No smart phone for virtual program.   Graylon Good, RN BSN  02/12/2019 10:17 AM

## 2019-02-12 NOTE — Progress Notes (Signed)
Pt discharged home with daughter.  Pt walked out to front of hospital with daughter.

## 2019-02-12 NOTE — Discharge Instructions (Signed)
Information about your medication: Brilinta (anti-platelet agent)  Generic Name (Brand): ticagrelor (Brilinta), twice daily medication  PURPOSE: You are taking this medication along with aspirin to lower your chance of having a heart attack, stroke, or blood clots in your heart stent. These can be fatal. Brilinta and aspirin help prevent platelets from sticking together and forming a clot that can block an artery or your stent.   Common SIDE EFFECTS you may experience include: bruising or bleeding more easily, shortness of breath  Do not stop taking BRILINTA without talking to the doctor who prescribes it for you. People who are treated with a stent and stop taking Brilinta too soon, have a higher risk of getting a blood clot in the stent, having a heart attack, or dying. If you stop Brilinta because of bleeding, or for other reasons, your risk of a heart attack or stroke may increase.   Tell all of your doctors and dentists that you are taking Brilinta. They should talk to the doctor who prescribed Brilinta for you before you have any surgery or invasive procedure.   Contact your health care provider if you experience: severe or uncontrollable bleeding, pink/red/brown urine, vomiting blood or vomit that looks like "coffee grounds", red or black stools (looks like tar), coughing up blood or blood clots ----------------------------------------------------------------------------------------------------------------------      Radial Site Care  This sheet gives you information about how to care for yourself after your procedure. Your health care provider may also give you more specific instructions. If you have problems or questions, contact your health care provider. What can I expect after the procedure? After the procedure, it is common to have:  Bruising and tenderness at the catheter insertion area. Follow these instructions at home: Medicines  Take over-the-counter and prescription  medicines only as told by your health care provider. Insertion site care  Follow instructions from your health care provider about how to take care of your insertion site. Make sure you: ? Wash your hands with soap and water before you change your bandage (dressing). If soap and water are not available, use hand sanitizer. ? Change your dressing as told by your health care provider. ? Leave stitches (sutures), skin glue, or adhesive strips in place. These skin closures may need to stay in place for 2 weeks or longer. If adhesive strip edges start to loosen and curl up, you may trim the loose edges. Do not remove adhesive strips completely unless your health care provider tells you to do that.  Check your insertion site every day for signs of infection. Check for: ? Redness, swelling, or pain. ? Fluid or blood. ? Pus or a bad smell. ? Warmth.  Do not take baths, swim, or use a hot tub until your health care provider approves.  You may shower 24-48 hours after the procedure, or as directed by your health care provider. ? Remove the dressing and gently wash the site with plain soap and water. ? Pat the area dry with a clean towel. ? Do not rub the site. That could cause bleeding.  Do not apply powder or lotion to the site. Activity   For 24 hours after the procedure, or as directed by your health care provider: ? Do not flex or bend the affected arm. ? Do not push or pull heavy objects with the affected arm. ? Do not drive yourself home from the hospital or clinic. You may drive 24 hours after the procedure unless your health care provider tells  you not to. ? Do not operate machinery or power tools.  Do not lift anything that is heavier than 10 lb (4.5 kg), or the limit that you are told, until your health care provider says that it is safe.  Ask your health care provider when it is okay to: ? Return to work or school. ? Resume usual physical activities or sports. ? Resume sexual  activity. General instructions  If the catheter site starts to bleed, raise your arm and put firm pressure on the site. If the bleeding does not stop, get help right away. This is a medical emergency.  If you went home on the same day as your procedure, a responsible adult should be with you for the first 24 hours after you arrive home.  Keep all follow-up visits as told by your health care provider. This is important. Contact a health care provider if:  You have a fever.  You have redness, swelling, or yellow drainage around your insertion site. Get help right away if:  You have unusual pain at the radial site.  The catheter insertion area swells very fast.  The insertion area is bleeding, and the bleeding does not stop when you hold steady pressure on the area.  Your arm or hand becomes pale, cool, tingly, or numb. These symptoms may represent a serious problem that is an emergency. Do not wait to see if the symptoms will go away. Get medical help right away. Call your local emergency services (911 in the [place] Do not drive yourself to the hospital. Summary  After the procedure, it is common to have bruising and tenderness at the site.  Follow instructions from your health care provider about how to take care of your radial site wound. Check the wound every day for signs of infection.  Do not lift anything that is heavier than 10 lb (4.5 kg), or the limit that you are told, until your health care provider says that it is safe. This information is not intended to replace advice given to you by your health care provider. Make sure you discuss any questions you have with your health care provider. Document Revised: 03/06/2017 Document Reviewed: 03/06/2017 Elsevier Patient Education  2020 [employer].

## 2019-02-12 NOTE — Plan of Care (Signed)

## 2019-02-12 NOTE — Plan of Care (Signed)

## 2019-02-12 NOTE — Plan of Care (Signed)
Problem: Education: Goal: Knowledge of General Education information will improve Description: Including pain rating scale, medication(s)/side effects and non-pharmacologic comfort measures 02/12/2019 1143 by Shanon Ace, RN Outcome: Adequate for Discharge 02/12/2019 0732 by Shanon Ace, RN Outcome: Progressing   Problem: Health Behavior/Discharge Planning: Goal: Ability to manage health-related needs will improve 02/12/2019 1143 by Shanon Ace, RN Outcome: Adequate for Discharge 02/12/2019 0732 by Shanon Ace, RN Outcome: Progressing   Problem: Clinical Measurements: Goal: Ability to maintain clinical measurements within normal limits will improve 02/12/2019 1143 by Shanon Ace, RN Outcome: Adequate for Discharge 02/12/2019 0732 by Shanon Ace, RN Outcome: Progressing Goal: Will remain free from infection 02/12/2019 1143 by Shanon Ace, RN Outcome: Adequate for Discharge 02/12/2019 0732 by Shanon Ace, RN Outcome: Progressing Goal: Diagnostic test results will improve 02/12/2019 1143 by Shanon Ace, RN Outcome: Adequate for Discharge 02/12/2019 0732 by Shanon Ace, RN Outcome: Progressing Goal: Respiratory complications will improve 02/12/2019 1143 by Shanon Ace, RN Outcome: Adequate for Discharge 02/12/2019 0732 by Shanon Ace, RN Outcome: Progressing Goal: Cardiovascular complication will be avoided 02/12/2019 1143 by Shanon Ace, RN Outcome: Adequate for Discharge 02/12/2019 0732 by Shanon Ace, RN Outcome: Progressing   Problem: Activity: Goal: Risk for activity intolerance will decrease 02/12/2019 1143 by Shanon Ace, RN Outcome: Adequate for Discharge 02/12/2019 0732 by Shanon Ace, RN Outcome: Progressing   Problem: Nutrition: Goal: Adequate nutrition will be maintained 02/12/2019 1143 by Shanon Ace, RN Outcome: Adequate for Discharge 02/12/2019 0732 by Shanon Ace, RN Outcome: Progressing   Problem:  Coping: Goal: Level of anxiety will decrease 02/12/2019 1143 by Shanon Ace, RN Outcome: Adequate for Discharge 02/12/2019 0732 by Shanon Ace, RN Outcome: Progressing   Problem: Elimination: Goal: Will not experience complications related to bowel motility 02/12/2019 1143 by Shanon Ace, RN Outcome: Adequate for Discharge 02/12/2019 0732 by Shanon Ace, RN Outcome: Progressing Goal: Will not experience complications related to urinary retention 02/12/2019 1143 by Shanon Ace, RN Outcome: Adequate for Discharge 02/12/2019 0732 by Shanon Ace, RN Outcome: Progressing   Problem: Pain Managment: Goal: General experience of comfort will improve 02/12/2019 1143 by Shanon Ace, RN Outcome: Adequate for Discharge 02/12/2019 0732 by Shanon Ace, RN Outcome: Progressing   Problem: Safety: Goal: Ability to remain free from injury will improve 02/12/2019 1143 by Shanon Ace, RN Outcome: Adequate for Discharge 02/12/2019 0732 by Shanon Ace, RN Outcome: Progressing   Problem: Skin Integrity: Goal: Risk for impaired skin integrity will decrease 02/12/2019 1143 by Shanon Ace, RN Outcome: Adequate for Discharge 02/12/2019 0732 by Shanon Ace, RN Outcome: Progressing   Problem: Education: Goal: Understanding of CV disease, CV risk reduction, and recovery process will improve 02/12/2019 1143 by Shanon Ace, RN Outcome: Adequate for Discharge 02/12/2019 0732 by Shanon Ace, RN Outcome: Progressing Goal: Individualized Educational Video(s) 02/12/2019 1143 by Shanon Ace, RN Outcome: Adequate for Discharge 02/12/2019 0732 by Shanon Ace, RN Outcome: Progressing   Problem: Activity: Goal: Ability to return to baseline activity level will improve 02/12/2019 1143 by Shanon Ace, RN Outcome: Adequate for Discharge 02/12/2019 0732 by Shanon Ace, RN Outcome: Progressing   Problem: Cardiovascular: Goal: Ability to achieve and maintain adequate  cardiovascular perfusion will improve 02/12/2019 1143 by Shanon Ace, RN Outcome: Adequate for Discharge 02/12/2019 0732 by Shanon Ace, RN Outcome: Progressing Goal: Vascular access site(s) Level 0-1 will be maintained 02/12/2019 1143 by Shanon Ace, RN Outcome: Adequate for Discharge 02/12/2019 0732 by Shanon Ace, RN Outcome: Progressing   Problem: Health Behavior/Discharge Planning: Goal: Ability to safely manage health-related needs after  discharge will improve 02/12/2019 1143 by Shanon Ace, RN Outcome: Adequate for Discharge 02/12/2019 0732 by Shanon Ace, RN Outcome: Progressing

## 2019-02-12 NOTE — Telephone Encounter (Signed)
[id number] for TOC f/u

## 2019-02-12 NOTE — Discharge Summary (Signed)
Discharge Summary    Patient ID: Courtney Mcconnell,  MRN: [id number], DOB/AGE: Sep 29, 1955 63 y.o.  Admit date: 02/10/2019 Discharge date: 02/12/2019  Primary Care Provider: Dettinger, Fransisca Kaufmann Primary Cardiologist: Glenetta Hew, MD  Discharge Diagnoses    Principal Problem:   NSTEMI (non-ST elevated myocardial infarction) [facility]) Active Problems:   Seizures ([place])   HTN (hypertension)   Hyperlipidemia   Allergies Allergies  Allergen Reactions  . Bee Venom Swelling  . Amoxicillin-Pot Clavulanate Rash  . Doxycycline Hyclate Rash    Rash and itching    Diagnostic Studies/Procedures    Cath: 02/11/19   Prox RCA lesion is 20% stenosed.  Ost Cx to Prox Cx lesion is 20% stenosed.  1st Diag lesion is 70% stenosed.  Mid LAD lesion is 80% stenosed.  A drug-eluting stent was successfully placed using a SYNERGY XD 2.50X28.  Post intervention, there is a 0% residual stenosis.   1. Severe mid LAD stenosis.  2. Successful PTCA/DES x 1 mid LAD 3. Moderately severe stenosis in the ostium of the moderate caliber diagonal branch.  4. Mild non-obstructive disease in the RCA and Circumflex  Recommendations: DAPT with ASA and Brilinta for one year. High intensity statin and beta blocker.   Diagnostic Dominance: Right  Intervention   TTE: 02/11/19  IMPRESSIONS    1. Left ventricular ejection fraction, by visual estimation, is 55 to 60%. The left ventricle has normal function. There is no left ventricular hypertrophy.  2. Left ventricular diastolic parameters are consistent with Grade I diastolic dysfunction (impaired relaxation).  3. The left ventricle has no regional wall motion abnormalities.  4. Global right ventricle has normal systolic function.The right ventricular size is normal. No increase in right ventricular wall thickness.  5. Left atrial size was normal.  6. Right atrial size was normal.  7. Presence of pericardial fat pad.  8. Trivial pericardial  effusion is present.  9. The mitral valve is grossly normal. Trivial mitral valve regurgitation. 10. The tricuspid valve is grossly normal. 11. The aortic valve is grossly normal. Aortic valve regurgitation is not visualized. No evidence of aortic valve sclerosis or stenosis. 12. The pulmonic valve was grossly normal. Pulmonic valve regurgitation is not visualized. 13. There is mild dilatation of the ascending aorta measuring 40 mm. 14. TR signal is inadequate for assessing pulmonary artery systolic pressure. 15. The inferior vena cava is normal in size with greater than 50% respiratory variability, suggesting right atrial pressure of 3 mmHg. 16. No prior Echocardiogram. 17. The average left ventricular global longitudinal strain is -16.3 %. _____________   History of Present Illness     Courtney Mcconnell is a 63 y.o. former smoker with COPD and seizure disorder who presented with chest pain, found to have EKG changes and elevated troponin c/w NSTEMI.   Courtney Mcconnell had not followed closely with her primary care doctor and had not previously been followed by cardiology. She was in her usual state of health - ambulatory, independent -  until 2PM the day of admission when she developed sudden onset chest pain while watching television. She described it as sharp, pressure, - "like someone was sitting on my chest". No radiation. No associated diaphoresis or nausea. Had not experienced similar pain, even less severe before. EMS was activated and she was given 325 ASA and SLN by EMS with complete alleviation of pain.   On ROS, no fevers, chills, cough. Chronic, stable SOB from COPD. Intermittent pedal edema but no orthopnea.  Found to have an elevated troponin in the ED, placed on IV heparin with plans for cardiac cath.    Hospital Course      Underwent cardiac cath noted above with severe mLAD stenosis treated with successful PTCA/DES x1. Plan for DAPT ASA/Brilinta at least one year. hsTn peaked  at 952. Does have residual disease of 70% in 1st diag to be treated medically. LDL noted at 153, and placed on high dose statin. Placed on low dose BB, coreg 3.125mg  BID. Follow up echo showed EF of 55-60% with G1DD. Worked well with cardiac rehab without recurrent chest pain. Received meds from [facility] prior to discharge.   General: Well developed, well nourished, female appearing in no acute distress. Head: Normocephalic, atraumatic.  Neck: Supple without bruits, JVD. Lungs:  Resp regular and unlabored, CTA. Heart: RRR, S1, S2, no S3, S4, or murmur; no rub. Abdomen: Soft, non-tender, non-distended with normoactive bowel sounds. No hepatomegaly. No rebound/guarding. No obvious abdominal masses. Extremities: No clubbing, cyanosis, edema. Distal pedal pulses are 2+ bilaterally. Right radial cath site stable without bruising or hematoma Neuro: Alert and oriented X 3. Moves all extremities spontaneously. Psych: Normal affect.  Courtney Mcconnell was seen by Dr. Ellyn Hack and determined stable for discharge home. Follow up in the office has been arranged. Medications are listed below.   _____________  Discharge Vitals Blood pressure 129/87, pulse 83, temperature 98.6 F (37 C), temperature source Oral, resp. rate 13, height 5\' 6"  (1.676 m), weight 106.2 kg, SpO2 98 %.  Filed Weights   02/10/19 2331 02/11/19 0048 02/11/19 0829  Weight: 107 kg 107.5 kg 106.2 kg    Labs & Radiologic Studies    CBC Recent Labs    02/11/19 0211 02/12/19 0224  WBC 10.2 10.0  HGB 14.4 13.5  HCT 41.9 40.4  MCV 95.7 95.7  PLT 251 [id number]   Basic Metabolic Panel Recent Labs    02/11/19 0211 02/12/19 0224  NA 138 138  K 3.4* 3.5  CL 106 107  CO2 21* 23  GLUCOSE 110* 124*  BUN 7* 6*  CREATININE 0.84 0.80  CALCIUM 8.8* 9.1   Liver Function Tests No results for input(s): AST, ALT, ALKPHOS, BILITOT, PROT, ALBUMIN in the last 72 hours. No results for input(s): LIPASE, AMYLASE in the last 72  hours. Cardiac Enzymes No results for input(s): CKTOTAL, CKMB, CKMBINDEX, TROPONINI in the last 72 hours. BNP Invalid input(s): POCBNP D-Dimer No results for input(s): DDIMER in the last 72 hours. Hemoglobin A1C Recent Labs    02/11/19 0211  HGBA1C 6.0*   Fasting Lipid Panel Recent Labs    02/11/19 0211  CHOL 221*  HDL 56  LDLCALC 153*  TRIG 59  CHOLHDL 3.9   Thyroid Function Tests Recent Labs    02/11/19 0211  TSH 3.299   _____________  DG Chest 2 View  Result Date: 02/10/2019 CLINICAL DATA:  Chest pain EXAM: CHEST - 2 VIEW COMPARISON:  August 31, 2018 FINDINGS: There is no edema or consolidation. Heart is slightly enlarged with pulmonary vascularity normal. No adenopathy. No pneumothorax. No bone lesions. Surgical clips are noted in the right upper abdomen. IMPRESSION: Mild cardiac enlargement. No edema or consolidation. No adenopathy. Electronically Signed   By: Lowella Grip III M.D.   On: 02/10/2019 16:33   CARDIAC CATHETERIZATION  Result Date: 02/11/2019  Prox RCA lesion is 20% stenosed.  Ost Cx to Prox Cx lesion is 20% stenosed.  1st Diag lesion is 70% stenosed.  Mid LAD  lesion is 80% stenosed.  A drug-eluting stent was successfully placed using a SYNERGY XD 2.50X28.  Post intervention, there is a 0% residual stenosis.  1. Severe mid LAD stenosis. 2. Successful PTCA/DES x 1 mid LAD 3. Moderately severe stenosis in the ostium of the moderate caliber diagonal branch. 4. Mild non-obstructive disease in the RCA and Circumflex Recommendations: DAPT with ASA and Brilinta for one year. High intensity statin and beta blocker.   ECHOCARDIOGRAM COMPLETE  Result Date: 02/11/2019   ECHOCARDIOGRAM REPORT   Patient Name:   Courtney Mcconnell [facility] Date of Exam: 02/11/2019 Medical Rec #:  [id number]      Height: Accession #:    [id number]     Weight: Date of Birth:  07-24-1955      BSA: Patient Age:    65 years       BP:           151/87 mmHg Patient Gender: F              HR:            64 bpm. Exam Location:  Inpatient Procedure: 2D Echo and Strain Analysis Indications:    Chest Pain 786.50 / R07.9  History:        Patient has no prior history of Echocardiogram examinations.                 NSTEMI, COPD; Risk Factors:Hypertension and Dyslipidemia.  Sonographer:    Darlina Sicilian RDCS Referring Phys: [id number] [place]  1. Left ventricular ejection fraction, by visual estimation, is 55 to 60%. The left ventricle has normal function. There is no left ventricular hypertrophy.  2. Left ventricular diastolic parameters are consistent with Grade I diastolic dysfunction (impaired relaxation).  3. The left ventricle has no regional wall motion abnormalities.  4. Global right ventricle has normal systolic function.The right ventricular size is normal. No increase in right ventricular wall thickness.  5. Left atrial size was normal.  6. Right atrial size was normal.  7. Presence of pericardial fat pad.  8. Trivial pericardial effusion is present.  9. The mitral valve is grossly normal. Trivial mitral valve regurgitation. 10. The tricuspid valve is grossly normal. 11. The aortic valve is grossly normal. Aortic valve regurgitation is not visualized. No evidence of aortic valve sclerosis or stenosis. 12. The pulmonic valve was grossly normal. Pulmonic valve regurgitation is not visualized. 13. There is mild dilatation of the ascending aorta measuring 40 mm. 14. TR signal is inadequate for assessing pulmonary artery systolic pressure. 15. The inferior vena cava is normal in size with greater than 50% respiratory variability, suggesting right atrial pressure of 3 mmHg. 16. No prior Echocardiogram. 17. The average left ventricular global longitudinal strain is -16.3 %. FINDINGS  Left Ventricle: Left ventricular ejection fraction, by visual estimation, is 55 to 60%. The left ventricle has normal function. The average left ventricular global longitudinal strain is -16.3 %. The left ventricle has no  regional wall motion abnormalities. The left ventricular internal cavity size was the left ventricle is normal in size. There is no left ventricular hypertrophy. Left ventricular diastolic parameters are consistent with Grade I diastolic dysfunction (impaired relaxation). Normal left atrial pressure. Right Ventricle: The right ventricular size is normal. No increase in right ventricular wall thickness. Global RV systolic function is has normal systolic function. Left Atrium: Left atrial size was normal in size. Right Atrium: Right atrial size was normal in size Pericardium: Trivial pericardial effusion is  present. Presence of pericardial fat pad. Mitral Valve: The mitral valve is grossly normal. Trivial mitral valve regurgitation. Tricuspid Valve: The tricuspid valve is grossly normal. Tricuspid valve regurgitation is trivial. Aortic Valve: The aortic valve is grossly normal. Aortic valve regurgitation is not visualized. The aortic valve is structurally normal, with no evidence of sclerosis or stenosis. Pulmonic Valve: The pulmonic valve was grossly normal. Pulmonic valve regurgitation is not visualized. Pulmonic regurgitation is not visualized. Aorta: The aortic root is normal in size and structure. There is mild dilatation of the ascending aorta measuring 40 mm. Venous: The inferior vena cava is normal in size with greater than 50% respiratory variability, suggesting right atrial pressure of 3 mmHg. IAS/Shunts: No atrial level shunt detected by color flow Doppler.  LEFT VENTRICLE PLAX 2D LVIDd:         5.60 cm  Diastology LVIDs:         3.80 cm  LV e' lateral:   3.26 cm/s LV PW:         1.40 cm  LV E/e' lateral: 20.2 LV IVS:        1.50 cm  LV e' medial:    5.11 cm/s LVOT diam:     2.30 cm  LV E/e' medial:  12.9 LV SV:         92 ml LVOT Area:     4.15 cm 2D Longitudinal Strain                         2D Strain GLS Avg:     -16.3 % LEFT ATRIUM LA diam:      1.30 cm LA Vol (A2C): 49.0 ml LA Vol (A4C): 43.0 ml  MV  E velocity: 66.00 cm/s    103 cm/s MV A velocity: 10200.00 cm/s 70.3 cm/s SHUNTS MV E/A ratio:  0.01          1.5       Systemic Diam: 2.30 cm  Courtney Chiquito MD Electronically signed by Courtney Chiquito MD Signature Date/Time: 02/11/2019/9:44:50 AM    Final    Disposition   Pt is being discharged home today in good condition.  Follow-up Plans & Appointments    Follow-up Information    Lendon Colonel, NP Follow up on 02/19/2019.   Specialties: Nurse Practitioner, Radiology, Cardiology Why: at 9:30am for your follow up appt.  Contact information: [address] [place] [address] [phone]        Dettinger, Fransisca Kaufmann, MD .   Specialties: Family Medicine, Cardiology Contact information: [place] [id number] [phone]          Discharge Instructions    Call MD for:  redness, tenderness, or signs of infection (pain, swelling, redness, odor or green/yellow discharge around incision site)   Complete by: As directed    Diet - low sodium heart healthy   Complete by: As directed    Discharge instructions   Complete by: As directed    Radial Site Care Refer to this sheet in the next few weeks. These instructions provide you with information on caring for yourself after your procedure. Your caregiver may also give you more specific instructions. Your treatment has been planned according to current medical practices, but problems sometimes occur. Call your caregiver if you have any problems or questions after your procedure. HOME CARE INSTRUCTIONS You may shower the day after the procedure.Remove the bandage (dressing) and gently wash the site with  plain soap and water.Gently pat the site dry.  Do not apply powder or lotion to the site.  Do not submerge the affected site in water for 3 to 5 days.  Inspect the site at least twice daily.  Do not flex or bend the affected arm for 24 hours.  No lifting over 5 pounds (2.3 kg) for 5 days after your  procedure.  Do not drive home if you are discharged the same day of the procedure. Have someone else drive you.  You may drive 24 hours after the procedure unless otherwise instructed by your caregiver.  What to expect: Any bruising will usually fade within 1 to 2 weeks.  Blood that collects in the tissue (hematoma) may be painful to the touch. It should usually decrease in size and tenderness within 1 to 2 weeks.  SEEK IMMEDIATE MEDICAL CARE IF: You have unusual pain at the radial site.  You have redness, warmth, swelling, or pain at the radial site.  You have drainage (other than a small amount of blood on the dressing).  You have chills.  You have a fever or persistent symptoms for more than 72 hours.  You have a fever and your symptoms suddenly get worse.  Your arm becomes pale, cool, tingly, or numb.  You have heavy bleeding from the site. Hold pressure on the site.   PLEASE DO NOT MISS ANY DOSES OF YOUR BRILINTA!!!!! Also keep a log of you blood pressures and bring back to your follow up appt. Please call the office with any questions.   Patients taking blood thinners should generally stay away from medicines like ibuprofen, Advil, Motrin, naproxen, and Aleve due to risk of stomach bleeding. You may take Tylenol as directed or talk to your primary doctor about alternatives.   Increase activity slowly   Complete by: As directed        Discharge Medications     Medication List    STOP taking these medications   ciprofloxacin-hydrocortisone OTIC suspension Commonly known as: Cipro HC   neomycin-colistin-hydrocortisone-thonzonium 3.04-14-08-0.5 MG/ML OTIC suspension Commonly known as: Cortisporin-TC   traMADol 50 MG tablet Commonly known as: ULTRAM     TAKE these medications   albuterol 108 (90 Base) MCG/ACT inhaler Commonly known as: VENTOLIN HFA INHALE TWO PUFFS INTO THE LUNGS EVERY 6 HOURS AS NEEDED FOR WHEEZING OR SHORTNESS OF BREATH What changed:   how much to  take  how to take this  when to take this  reasons to take this  additional instructions   aspirin 81 MG EC tablet Take 1 tablet (81 mg total) by mouth daily.   atorvastatin 40 MG tablet Commonly known as: LIPITOR Take 1 tablet (40 mg total) by mouth daily at 6 PM.   carvedilol 3.125 MG tablet Commonly known as: COREG Take 1 tablet (3.125 mg total) by mouth 2 (two) times daily with a meal.   nitroGLYCERIN 0.4 MG SL tablet Commonly known as: NITROSTAT Place 1 tablet (0.4 mg total) under the tongue every 5 (five) minutes as needed for chest pain.   omeprazole 20 MG tablet Commonly known as: PRILOSEC OTC Take 20 mg by mouth daily.   Spiriva HandiHaler 18 MCG inhalation capsule Generic drug: tiotropium INHALE THE CONTENTS OF ONE CAPSULE IN THE HANDIHALER ONCE DAILY What changed: See the new instructions.   ticagrelor 90 MG Tabs tablet Commonly known as: BRILINTA Take 1 tablet (90 mg total) by mouth 2 (two) times daily.   Vimpat 100 MG Tabs  Generic drug: Lacosamide Take 300 mg by mouth 2 (two) times daily.   zonisamide 100 MG capsule Commonly known as: ZONEGRAN Take 200 mg by mouth at bedtime.        Yes                               AHA/ACC Clinical Performance & Quality Measures: 1. Aspirin prescribed? - Yes 2. ADP Receptor Inhibitor (Plavix/Clopidogrel, Brilinta/Ticagrelor or Effient/Prasugrel) prescribed (includes medically managed patients)? - Yes 3. Beta Blocker prescribed? - Yes 4. High Intensity Statin (Lipitor 40-80mg  or Crestor 20-40mg ) prescribed? - Yes 5. EF assessed during THIS hospitalization? - Yes 6. For EF <40%, was ACEI/ARB prescribed? - Not Applicable (EF >/= [id number]) 7. For EF <40%, Aldosterone Antagonist (Spironolactone or Eplerenone) prescribed? - Not Applicable (EF >/= [id number]) 8. Cardiac Rehab Phase II ordered (Included Medically managed Patients)? - Yes      Outstanding Labs/Studies   FLP/LFTs in 8 weeks  Duration of Discharge Encounter     Greater than 30 minutes including physician time.  Signed, Reino Bellis NP-C 02/12/2019, 8:21 AM

## 2019-02-12 NOTE — Care Management (Signed)
Submitted benefit check for Brilinta

## 2019-02-16 ENCOUNTER — Telehealth: Payer: Self-pay | Admitting: Family Medicine

## 2019-02-16 NOTE — Telephone Encounter (Signed)
Appt made

## 2019-02-16 NOTE — Telephone Encounter (Signed)
Lm to call back ./cy 

## 2019-02-17 NOTE — Progress Notes (Signed)
Cardiology Office Note   Date:  02/19/2019   ID:  Duanna, Runk 1955-05-12, MRN [id number]  PCP:  Dettinger, Fransisca Kaufmann, MD  Cardiologist:  Ellyn Hack  CC: Post hospital follow up-CAD with intervention    History of Present Illness: Courtney Mcconnell is a 64 y.o. female who presents for post hospital follow up after admission on 02/10/2019 for NSTEMI. Other history include hypertension,COPD, seizure disorder , and former tobacco abuse.  She was seen in ED after calling EMS for sudden onset of chest pain while watching TV. She underwent LHC with severe LAD stenosis noted at 80% in the mid LAD, lst diagonal with 70% stenosis. She had successful  DES X 1 to the mid LAD.  She was discharged on 02/12/2019 with coreg 3.125 mg BID, atorvastatin 40 mg daily, Brilinta 90 mg BID and ASA 81 mg daily.   She comes today with her daughter, as she has trouble with memory and ambulation. She is without cardiac complaints. She is medically compliant. She is not having any bleeding or excessive bruising from Brilinta. She has not yet been contacted by cardiac rehab. Her daughter has called them and left VM.  Past Medical History:  Diagnosis Date  . Asthma   . Bursitis of left hip    right  . COPD (chronic obstructive pulmonary disease) ([place])   . GERD (gastroesophageal reflux disease)   . Headache(784.0)   . Hyperlipidemia   . Hypertension   . NSTEMI (non-ST elevated myocardial infarction) ([place]) 02/11/2019   PCI/DES x1 to mLAD, 70% lesion in 1st diag  . Pneumonia   . Seizure ([place])    most recent 12/12/14  . Sleep apnea     Past Surgical History:  Procedure Laterality Date  . ABDOMINAL HYSTERECTOMY    . APPENDECTOMY    . CATARACT EXTRACTION Bilateral   . CHOLECYSTECTOMY    . CORONARY STENT INTERVENTION N/A 02/11/2019   Procedure: CORONARY STENT INTERVENTION;  Surgeon: Burnell Blanks, MD;  Location: [place] [facility];  Service: Cardiovascular;  Laterality: N/A;  . heal spur     right  .  INTRAVASCULAR PRESSURE WIRE/FFR STUDY N/A 02/11/2019   Procedure: INTRAVASCULAR PRESSURE WIRE/FFR STUDY;  Surgeon: Burnell Blanks, MD;  Location: [place] [facility];  Service: Cardiovascular;  Laterality: N/A;  . LASER PHOTO ABLATION Right 08/27/2013   Procedure: LASER PHOTO ABLATION;  Surgeon: Hayden Pedro, MD;  [place] ;  Service: Ophthalmology;  Laterality: Right;  Headscope laser AND Endolaser  . LEFT HEART CATH AND CORONARY ANGIOGRAPHY N/A 02/11/2019   Procedure: LEFT HEART CATH AND CORONARY ANGIOGRAPHY;  Surgeon: Burnell Blanks, MD;  Location: [place] [facility];  Service: Cardiovascular;  Laterality: N/A;  . MEMBRANE PEEL Right 08/27/2013   Procedure: MEMBRANE PEEL;  Surgeon: Hayden Pedro, MD;  Location: [place];  Service: Ophthalmology;  Laterality: Right;  . PARS PLANA VITRECTOMY Right 08/27/2013   Procedure: PARS PLANA VITRECTOMY WITH 25 GAUGE;  Surgeon: Hayden Pedro, MD;  Location: [place];  Service: Ophthalmology;  Laterality: Right;     Current Outpatient Medications  Medication Sig Dispense Refill  . albuterol (VENTOLIN HFA) 108 (90 Base) MCG/ACT inhaler INHALE TWO PUFFS INTO THE LUNGS EVERY 6 HOURS AS NEEDED FOR WHEEZING OR SHORTNESS OF BREATH (Patient taking differently: Inhale 2 puffs into the lungs every 6 (six) hours as needed for wheezing. ) 8.5 g 0  . aspirin EC 81 MG EC tablet Take 1 tablet (81 mg total)  by mouth daily. 90 tablet 1  . atorvastatin (LIPITOR) 40 MG tablet Take 1 tablet (40 mg total) by mouth daily at 6 PM. 90 tablet 1  . carvedilol (COREG) 3.125 MG tablet Take 1 tablet (3.125 mg total) by mouth 2 (two) times daily with a meal. 60 tablet 2  . nitroGLYCERIN (NITROSTAT) 0.4 MG SL tablet Place 1 tablet (0.4 mg total) under the tongue every 5 (five) minutes as needed for chest pain. 30 tablet 5  . omeprazole (PRILOSEC OTC) 20 MG tablet Take 20 mg by mouth daily.    Marland Kitchen SPIRIVA HANDIHALER 18 MCG inhalation capsule INHALE THE CONTENTS OF ONE  CAPSULE IN THE HANDIHALER ONCE DAILY (Patient taking differently: Place 18 mcg into inhaler and inhale daily. ) 30 capsule 0  . ticagrelor (BRILINTA) 90 MG TABS tablet Take 1 tablet (90 mg total) by mouth 2 (two) times daily. 180 tablet 2  . VIMPAT 100 MG TABS Take 300 mg by mouth 2 (two) times daily.    Marland Kitchen zonisamide (ZONEGRAN) 100 MG capsule Take 200 mg by mouth at bedtime.   3   No current facility-administered medications for this visit.    Allergies:   Bee venom, Amoxicillin-pot clavulanate, and Doxycycline hyclate    Social History:  The patient  reports that she quit smoking about 4 years ago. Her smoking use included cigarettes. She has a 10.50 pack-year smoking history. She has never used smokeless tobacco. She reports that she does not drink alcohol or use drugs.   Family History:  The patient's family history includes COPD in her mother; Cancer in her paternal uncle and sister; Diabetes in her brother and father; Hyperlipidemia in her father; Hypertension in her father; Stroke in her paternal grandmother.    ROS: All other systems are reviewed and negative. Unless otherwise mentioned in H&P    PHYSICAL EXAM: VS:  BP 138/89   Pulse 64   Ht '5\' 6"'  (1.676 m)   Wt 234 lb 3.2 oz (106.2 kg)   SpO2 99%   BMI 37.80 kg/m  , BMI Body mass index is 37.8 kg/m. GEN: Well nourished, well developed, in no acute distress HEENT: normal Neck: no JVD, carotid bruits, or masses Cardiac:RRR; no murmurs, rubs, or gallops,no edema  Respiratory:  Clear to auscultation bilaterally, normal work of breathing GI: soft, nontender, nondistended, + BS MS: deformity of left lower leg, old deep indention and scarring from lawn mower accident,  no atrophy. Ambulation is unsteady as a result. Good pulses. Skin: warm and dry, no rash.Old ecchymosis of right wrist cath insertion site. No pain on palpation or hematoma.  Neuro:  Strength and sensation are intact Psych: euthymic mood, full affect   EKG:   Personally reviewed by me.NSR rate of 64 bpm, Incomplete RBBB, Non-specific T wave abnormality.   Recent Labs: 04/13/2018: Magnesium 2.2 02/11/2019: B Natriuretic Peptide 77.4; TSH 3.299 02/12/2019: BUN 6; Creatinine, Ser 0.80; Hemoglobin 13.5; Platelets 245; Potassium 3.5; Sodium 138    Lipid Panel    Component Value Date/Time   CHOL 221 (H) 02/11/2019 0211   TRIG 59 02/11/2019 0211   HDL 56 02/11/2019 0211   CHOLHDL 3.9 02/11/2019 0211   VLDL 12 02/11/2019 0211   LDLCALC 153 (H) 02/11/2019 0211   LDLDIRECT 151 (H) 07/06/2015 1604      Wt Readings from Last 3 Encounters:  02/18/19 234 lb 3.2 oz (106.2 kg)  02/11/19 234 lb 1.6 oz (106.2 kg)  08/15/18 240 lb (108.9 kg)  Other studies Reviewed: Cath: 02/11/19   Prox RCA lesion is 20% stenosed.  Ost Cx to Prox Cx lesion is 20% stenosed.  1st Diag lesion is 70% stenosed.  Mid LAD lesion is 80% stenosed.  A drug-eluting stent was successfully placed using a SYNERGY XD [id number].  Post intervention, there is a 0% residual stenosis.  1. Severe mid LAD stenosis.  2. Successful PTCA/DES x 1 mid LAD 3. Moderately severe stenosis in the ostium of the moderate caliber diagonal branch.  4. Mild non-obstructive disease in the RCA and Circumflex  Recommendations: DAPT with ASA and Brilinta for one year. High intensity statin and beta blocker.   TTE: 02/11/19  IMPRESSIONS 1. Left ventricular ejection fraction, by visual estimation, is 55 to 60%. The left ventricle has normal function. There is no left ventricular hypertrophy. 2. Left ventricular diastolic parameters are consistent with Grade I diastolic dysfunction (impaired relaxation). 3. The left ventricle has no regional wall motion abnormalities. 4. Global right ventricle has normal systolic function.The right ventricular size is normal. No increase in right ventricular wall thickness. 5. Left atrial size was normal. 6. Right atrial size was normal. 7.  Presence of pericardial fat pad. 8. Trivial pericardial effusion is present. 9. The mitral valve is grossly normal. Trivial mitral valve regurgitation. 10. The tricuspid valve is grossly normal. 11. The aortic valve is grossly normal. Aortic valve regurgitation is not visualized. No evidence of aortic valve sclerosis or stenosis. 12. The pulmonic valve was grossly normal. Pulmonic valve regurgitation is not visualized. 13. There is mild dilatation of the ascending aorta measuring 40 mm. 14. TR signal is inadequate for assessing pulmonary artery systolic pressure. 15. The inferior vena cava is normal in size with greater than 50% respiratory variability, suggesting right atrial pressure of 3 mmHg. 16. No prior Echocardiogram. 17. The average left ventricular global longitudinal strain is -16.3 %. _____________   ASSESSMENT AND PLAN:  1.CAD: S/P NSTEMI of the left anterior descending artery requiring intervention with DES to 80% stenosed mid LAD.  Residual 70% lst diagonal. No symptoms are reported. She states she is feeling better. She does not ambulate a lot due to her left leg injury, which is old, but is willing to participate in cardiac rehab.   I have reviewed the cath results and provided her with a printed illustration of the stent location.  She will remain on DAPT for one year. She is given refills. All questions are answered.   2. Hyperlipidemia: She is to continue atorvastatin. She will need fasting lipids and LFTs in 3 months just before follow up appointment. Goal of LDL < 70. I have explained this to her and to her daughter. LDL 123 while recently hospitalized.  3. Hypertension: BP is essentially controlled.Daughter states that BP at home is coinciding with current BP. Would like to see it lower for optimal BP in patient with CAD. Will follow this in 3 months.   4. COPD: No increased wheezing on coreg or increase difficulty breathing on Brilinta. She is to follow up with PCP  for management. She no longer smokes, stopped 4 years ago.    Current medicines are reviewed at length with the patient today.  I have spent 40 minutes dedicated to the care of this patient on the date of this encounter to include pre-visit review of records, assessment, management and diagnostic testing,and explanation of cardiac cath findings, with shared decision making.  Labs/ tests ordered today include: BMET,CBC, and Fasting Lipids and LFTs  Phill Myron. West Pugh, ANP, AACC   02/19/2019 9:18 AM    [address] Office [phone] Fax [phone]  Notice: This dictation was prepared with Dragon dictation along with smaller phrase technology. Any transcriptional errors that result from this process are unintentional and may not be corrected upon review.

## 2019-02-17 NOTE — Telephone Encounter (Signed)
Patient contacted regarding discharge from [facility] on 02/12/19.  Patient understands to follow up with provider Bunnie Domino NP  on 02/18/19 at 3:45 PM at [facility]. Patient understands discharge instructions? YES Patient understands medications and regiment? YES Patient understands to bring all medications to this visit? YES   P

## 2019-02-18 ENCOUNTER — Ambulatory Visit (INDEPENDENT_AMBULATORY_CARE_PROVIDER_SITE_OTHER): Payer: Medicare Other | Admitting: Adult Health

## 2019-02-18 ENCOUNTER — Encounter: Payer: Self-pay | Admitting: Adult Health

## 2019-02-18 ENCOUNTER — Other Ambulatory Visit: Payer: Self-pay

## 2019-02-18 VITALS — BP 138/89 | HR 64 | Ht 66.0 in | Wt 234.2 lb

## 2019-02-18 DIAGNOSIS — I10 Essential (primary) hypertension: Secondary | ICD-10-CM

## 2019-02-18 DIAGNOSIS — E785 Hyperlipidemia, unspecified: Secondary | ICD-10-CM

## 2019-02-18 DIAGNOSIS — Z79899 Other long term (current) drug therapy: Secondary | ICD-10-CM

## 2019-02-18 DIAGNOSIS — I2510 Atherosclerotic heart disease of native coronary artery without angina pectoris: Secondary | ICD-10-CM | POA: Diagnosis not present

## 2019-02-18 DIAGNOSIS — I214 Non-ST elevation (NSTEMI) myocardial infarction: Secondary | ICD-10-CM | POA: Diagnosis not present

## 2019-02-18 DIAGNOSIS — J449 Chronic obstructive pulmonary disease, unspecified: Secondary | ICD-10-CM

## 2019-02-18 NOTE — Patient Instructions (Addendum)
Medication Instructions:  Continue current medications  *If you need a refill on your cardiac medications before your next appointment, please call your pharmacy*  Lab Work: BMP, CBC Fasting Lipid and liver  If you have labs (blood work) drawn today and your tests are completely normal, you will receive your results only by: Marland Kitchen MyChart Message (if you have MyChart) OR . A paper copy in the mail If you have any lab test that is abnormal or we need to change your treatment, we will call you to review the results.  Testing/Procedures: None Ordered  Follow-Up: At [facility], you and your health needs are our priority.  As part of our continuing mission to provide you with exceptional heart care, we have created designated Provider Care Teams.  These Care Teams include your primary Cardiologist (physician) and Advanced Practice Providers (APPs -  Physician Assistants and Nurse Practitioners) who all work together to provide you with the care you need, when you need it.  Your next appointment:   3 month(s)  The format for your next appointment:   In Person  Provider:   Glenetta Hew, MD  Other Instructions

## 2019-02-19 ENCOUNTER — Ambulatory Visit: Payer: Medicare Other | Admitting: Adult Health

## 2019-02-19 ENCOUNTER — Encounter: Payer: Self-pay | Admitting: Adult Health

## 2019-02-20 ENCOUNTER — Encounter: Payer: Self-pay | Admitting: Family Medicine

## 2019-02-20 ENCOUNTER — Other Ambulatory Visit: Payer: Self-pay

## 2019-02-20 ENCOUNTER — Ambulatory Visit (INDEPENDENT_AMBULATORY_CARE_PROVIDER_SITE_OTHER): Payer: Medicare Other | Admitting: Family Medicine

## 2019-02-20 VITALS — BP 132/79 | HR 64 | Temp 98.7°F | Ht 66.0 in | Wt 231.2 lb

## 2019-02-20 DIAGNOSIS — J440 Chronic obstructive pulmonary disease with acute lower respiratory infection: Secondary | ICD-10-CM | POA: Diagnosis not present

## 2019-02-20 DIAGNOSIS — I2510 Atherosclerotic heart disease of native coronary artery without angina pectoris: Secondary | ICD-10-CM | POA: Diagnosis not present

## 2019-02-20 DIAGNOSIS — Z959 Presence of cardiac and vascular implant and graft, unspecified: Secondary | ICD-10-CM

## 2019-02-20 DIAGNOSIS — E785 Hyperlipidemia, unspecified: Secondary | ICD-10-CM

## 2019-02-20 DIAGNOSIS — J209 Acute bronchitis, unspecified: Secondary | ICD-10-CM

## 2019-02-20 DIAGNOSIS — I10 Essential (primary) hypertension: Secondary | ICD-10-CM

## 2019-02-20 DIAGNOSIS — R7309 Other abnormal glucose: Secondary | ICD-10-CM | POA: Diagnosis not present

## 2019-02-20 MED ORDER — SPIRIVA HANDIHALER 18 MCG IN CAPS: 18.0000 ug | ORAL_CAPSULE | Freq: Every day | RESPIRATORY_TRACT | 5 refills | Status: DC

## 2019-02-20 NOTE — Progress Notes (Signed)
BP 132/79   Pulse 64   Temp 98.7 F (37.1 C) (Temporal)   Ht 5' 6" (1.676 m)   Wt 231 lb 3.2 oz (104.9 kg)   SpO2 98%   BMI 37.32 kg/m    Subjective:   Patient ID: Courtney Mcconnell, female    DOB: [birth year], 64 y.o.   MRN: [id number]  HPI: Courtney Mcconnell is a 64 y.o. female presenting on 02/20/2019 for Hospitalization Follow-up (12/29- [facility] - NSTEMI)   HPI Hospital f/u for NSTEMI and stent Patient is coming in today for hospital follow-up for NSTEMI and stent placement.  She was in the emergency department in hospital on 02/10/2019 until 02/12/2019.  She had a stent placed in her left anterior descending that was 80% stenosed.  She went in with chest pain and pressure and shortness of breath and tightness and since she has been out she has had none of that.  Their only complaint since being out is she does get some muscle cramping.  They did tell her her potassium was borderline and possibly need to start some of that, we will check her potassium again today.  Relevant past medical, surgical, family and social history reviewed and updated as indicated. Interim medical history since our last visit reviewed. Allergies and medications reviewed and updated.  Review of Systems  Constitutional: Negative for chills and fever.  Eyes: Negative for visual disturbance.  Respiratory: Negative for cough, chest tightness and shortness of breath.   Cardiovascular: Negative for chest pain and leg swelling.  Musculoskeletal: Positive for myalgias (Muscle cramping). Negative for back pain and gait problem.  Skin: Negative for rash.  Neurological: Negative for dizziness, light-headedness and headaches.  Psychiatric/Behavioral: Negative for agitation and behavioral problems.  All other systems reviewed and are negative.   Per HPI unless specifically indicated above   Allergies as of 02/20/2019      Reactions   Bee Venom Swelling   Amoxicillin-pot Clavulanate Rash   Doxycycline Hyclate Rash   Rash  and itching      Medication List       Accurate as of February 20, 2019  3:09 PM. If you have any questions, ask your nurse or doctor.        albuterol 108 (90 Base) MCG/ACT inhaler Commonly known as: VENTOLIN HFA INHALE TWO PUFFS INTO THE LUNGS EVERY 6 HOURS AS NEEDED FOR WHEEZING OR SHORTNESS OF BREATH What changed:   how much to take  how to take this  when to take this  reasons to take this  additional instructions   aspirin 81 MG EC tablet Take 1 tablet (81 mg total) by mouth daily.   atorvastatin 40 MG tablet Commonly known as: LIPITOR Take 1 tablet (40 mg total) by mouth daily at 6 PM.   carvedilol 3.125 MG tablet Commonly known as: COREG Take 1 tablet (3.125 mg total) by mouth 2 (two) times daily with a meal.   nitroGLYCERIN 0.4 MG SL tablet Commonly known as: NITROSTAT Place 1 tablet (0.4 mg total) under the tongue every 5 (five) minutes as needed for chest pain.   omeprazole 20 MG tablet Commonly known as: PRILOSEC OTC Take 20 mg by mouth daily.   Spiriva HandiHaler 18 MCG inhalation capsule Generic drug: tiotropium Place 1 capsule (18 mcg total) into inhaler and inhale daily. What changed: See the new instructions. Changed by: Fransisca Kaufmann , MD   ticagrelor 90 MG Tabs tablet Commonly known as: BRILINTA Take 1 tablet (90 mg  total) by mouth 2 (two) times daily.   Vimpat 100 MG Tabs Generic drug: Lacosamide Take 300 mg by mouth 2 (two) times daily.   zonisamide 100 MG capsule Commonly known as: ZONEGRAN Take 200 mg by mouth at bedtime.        Objective:   BP 132/79   Pulse 64   Temp 98.7 F (37.1 C) (Temporal)   Ht 5' 6" (1.676 m)   Wt 231 lb 3.2 oz (104.9 kg)   SpO2 98%   BMI 37.32 kg/m   Wt Readings from Last 3 Encounters:  02/20/19 231 lb 3.2 oz (104.9 kg)  02/18/19 234 lb 3.2 oz (106.2 kg)  02/11/19 234 lb 1.6 oz (106.2 kg)    Physical Exam Vitals and nursing note reviewed.  Constitutional:      General: She is not in  acute distress.    Appearance: She is well-developed. She is not diaphoretic.  Eyes:     Conjunctiva/sclera: Conjunctivae normal.  Cardiovascular:     Rate and Rhythm: Normal rate and regular rhythm.     Heart sounds: Normal heart sounds. No murmur.  Pulmonary:     Effort: Pulmonary effort is normal. No respiratory distress.     Breath sounds: Normal breath sounds. No wheezing.  Musculoskeletal:        General: No tenderness. Normal range of motion.  Skin:    General: Skin is warm and dry.     Findings: No rash.  Neurological:     Mental Status: She is alert and oriented to person, place, and time.     Coordination: Coordination normal.  Psychiatric:        Behavior: Behavior normal.       Assessment & Plan:   Problem List Items Addressed This Visit      Cardiovascular and Mediastinum   HTN (hypertension) - Primary   Relevant Orders   CBC with Differential/Platelet   CMP14+EGFR     Other   Hyperlipidemia   Relevant Orders   CBC with Differential/Platelet   Lipid panel   S/P arterial stent   Relevant Orders   CBC with Differential/Platelet   CMP14+EGFR   Lipid panel    Other Visit Diagnoses    Acute bronchitis with COPD ([place])       Relevant Medications   tiotropium (SPIRIVA HANDIHALER) 18 MCG inhalation capsule   Other Relevant Orders   CBC with Differential/Platelet      Will check bloodwork, doing better, only has cramping but otherwise her breathing and everything she feels much better than she did before. Follow up plan: Return in about 4 months (around 06/20/2019), or if symptoms worsen or fail to improve, for Follow-up COPD and cholesterol.  Counseling provided for all of the vaccine components Orders Placed This Encounter  Procedures  . CBC with Differential/Platelet  . CMP14+EGFR  . Lipid panel    Caryl Pina, MD [facility] 02/20/2019, 3:09 PM

## 2019-02-21 LAB — CMP14+EGFR
ALT: 7 [IU]/L (ref 0–32)
AST: 12 [IU]/L (ref 0–40)
Albumin/Globulin Ratio: 1.7 (ref 1.2–2.2)
Albumin: 4.3 g/dL (ref 3.8–4.8)
Alkaline Phosphatase: 104 [IU]/L (ref 39–117)
BUN/Creatinine Ratio: 14 (ref 12–28)
BUN: 12 mg/dL (ref 8–27)
Bilirubin Total: 0.2 mg/dL (ref 0.0–1.2)
CO2: 23 mmol/L (ref 20–29)
Calcium: 9.5 mg/dL (ref 8.7–10.3)
Chloride: 101 mmol/L (ref 96–106)
Creatinine, Ser: 0.87 mg/dL (ref 0.57–1.00)
GFR calc Af Amer: 82 mL/min/{1.73_m2}
GFR calc non Af Amer: 71 mL/min/{1.73_m2}
Globulin, Total: 2.6 g/dL (ref 1.5–4.5)
Glucose: 123 mg/dL — ABNORMAL HIGH (ref 65–99)
Potassium: 3.8 mmol/L (ref 3.5–5.2)
Sodium: 138 mmol/L (ref 134–144)
Total Protein: 6.9 g/dL (ref 6.0–8.5)

## 2019-02-21 LAB — CBC WITH DIFFERENTIAL/PLATELET
Basophils Absolute: 0 10*3/uL (ref 0.0–0.2)
Basos: 1 %
EOS (ABSOLUTE): 0.2 10*3/uL (ref 0.0–0.4)
Eos: 3 %
Hematocrit: 40.8 % (ref 34.0–46.6)
Hemoglobin: 13.5 g/dL (ref 11.1–15.9)
Immature Grans (Abs): 0 10*3/uL (ref 0.0–0.1)
Immature Granulocytes: 0 %
Lymphocytes Absolute: 3.1 10*3/uL (ref 0.7–3.1)
Lymphs: 36 %
MCH: 32 pg (ref 26.6–33.0)
MCHC: 33.1 g/dL (ref 31.5–35.7)
MCV: 97 fL (ref 79–97)
Monocytes Absolute: 0.8 10*3/uL (ref 0.1–0.9)
Monocytes: 10 %
Neutrophils Absolute: 4.3 10*3/uL (ref 1.4–7.0)
Neutrophils: 50 %
Platelets: 280 10*3/uL (ref 150–450)
RBC: 4.22 x10E6/uL (ref 3.77–5.28)
RDW: 12.3 % (ref 11.7–15.4)
WBC: 8.5 10*3/uL (ref 3.4–10.8)

## 2019-02-21 LAB — LIPID PANEL
Chol/HDL Ratio: 2.7 ratio (ref 0.0–4.4)
Cholesterol, Total: 114 mg/dL (ref 100–199)
HDL: 42 mg/dL
LDL Chol Calc (NIH): 52 mg/dL (ref 0–99)
Triglycerides: 109 mg/dL (ref 0–149)
VLDL Cholesterol Cal: 20 mg/dL (ref 5–40)

## 2019-02-23 NOTE — Addendum Note (Signed)
Addended by: Brynda Peon F on: 02/23/2019 10:01 AM   Modules accepted: Orders

## 2019-02-24 LAB — SPECIMEN STATUS REPORT

## 2019-02-24 LAB — HGB A1C W/O EAG: Hgb A1c MFr Bld: 5.9 % — ABNORMAL HIGH (ref 4.8–5.6)

## 2019-03-09 ENCOUNTER — Telehealth (HOSPITAL_COMMUNITY): Payer: Self-pay | Admitting: *Deleted

## 2019-03-09 NOTE — Telephone Encounter (Signed)
Patient daughter called me about a week ago to discuss program. She said she would talk to her mom and call me back. Had not heard from daughter so I called her back today. Daughter stated that her Mom wants to wait until we reopen to do the in gym program. She does not have a smart phone. We will call her when we reopen.

## 2019-04-27 ENCOUNTER — Other Ambulatory Visit: Payer: Self-pay | Admitting: Cardiology

## 2019-05-18 ENCOUNTER — Ambulatory Visit (INDEPENDENT_AMBULATORY_CARE_PROVIDER_SITE_OTHER): Payer: Medicare Other | Admitting: Cardiology

## 2019-05-18 ENCOUNTER — Encounter: Payer: Self-pay | Admitting: Cardiology

## 2019-05-18 ENCOUNTER — Other Ambulatory Visit: Payer: Self-pay

## 2019-05-18 VITALS — BP 144/88 | HR 56 | Ht 64.0 in | Wt 226.4 lb

## 2019-05-18 DIAGNOSIS — I10 Essential (primary) hypertension: Secondary | ICD-10-CM

## 2019-05-18 DIAGNOSIS — I252 Old myocardial infarction: Secondary | ICD-10-CM | POA: Diagnosis not present

## 2019-05-18 DIAGNOSIS — I25119 Atherosclerotic heart disease of native coronary artery with unspecified angina pectoris: Secondary | ICD-10-CM | POA: Diagnosis not present

## 2019-05-18 DIAGNOSIS — E785 Hyperlipidemia, unspecified: Secondary | ICD-10-CM

## 2019-05-18 DIAGNOSIS — Z955 Presence of coronary angioplasty implant and graft: Secondary | ICD-10-CM

## 2019-05-18 MED ORDER — LOSARTAN POTASSIUM 25 MG PO TABS: 25.0000 mg | ORAL_TABLET | Freq: Every day | ORAL | 3 refills | Status: DC

## 2019-05-18 NOTE — Patient Instructions (Addendum)
Medication Instructions:   losartan 25 mg  One tablet daily   *If you need a refill on your cardiac medications before your next appointment, please call your pharmacy*   Lab Work:   please have primary added these labs  As well CMP, LIPID    If you have labs (blood work) drawn today and your tests are completely normal, you will receive your results only by: Marland Kitchen MyChart Message (if you have MyChart) OR . A paper copy in the mail If you have any lab test that is abnormal or we need to change your treatment, we will call you to review the results.   Testing/Procedures: NOT  NEEDED   Follow-Up: At [facility], you and your health needs are our priority.  As part of our continuing mission to provide you with exceptional heart care, we have created designated Provider Care Teams.  These Care Teams include your primary Cardiologist (physician) and Advanced Practice Providers (APPs -  Physician Assistants and Nurse Practitioners) who all work together to provide you with the care you need, when you need it.    Your next appointment:   4 month(s)- Aug 2021   The format for your next appointment:   In Person  Provider:   Jory Sims, DNP, ANP   in 8 months  DEC 2021- schedule at [facility] office     Other Instructions  please have primary added these labs  As well CMP, LIPID    contact number for Cardiac Rehab  At [facility] --  [phone]

## 2019-05-18 NOTE — Progress Notes (Signed)
Primary Care Provider: Dettinger, Fransisca Kaufmann, MD Cardiologist: Glenetta Hew, MD Electrophysiologist: None  Clinic Note: Chief Complaint  Patient presents with  . Follow-up    3 months.  . Coronary Artery Disease    Mild non-STEMI, mid LAD lesion treated with DES stent.  No further angina    HPI:    Courtney Mcconnell is a 64 y.o. female with a PMH notable for CAD (presented with Unstable Angina/Non-STEMI December 2020)-DES PCI to LAD below who presents today for 13-month follow-up. --> She also has history of seizure disorder and COPD with former tobacco abuse along with hypertension.  Courtney Mcconnell was seen for post hospital follow-up by Jamal Collin, NP on February 18, 2019.  Notes that she has baseline history of memory issues and trouble with ambulation with unsteady gait.  Not able to do much medication therapy because of interactions with her antiseizure medicines.  Was not able to establish cardiac rehab secondary to COVID-19 restrictions. --> Notably feeling better.  No chest tightness or pressure.  Continued on statin with plans to follow-up lipids.  Recent Hospitalizations:   02/10/2019 admitted with Unstable Angina/non-STEMI  Reviewed  CV studies:    The following studies were reviewed today: (if available, images/films reviewed: From Epic Chart or Care Everywhere) . Echo 02/11/2019: EF 55 to 65%.  Normal wall motion..  GR 1 DD with normal LA and RA size.  Relatively normal valves.  Mild ascending aorta dilation of 40 mm. . Cath 02/12/2019: prox RCA 20%. Ost-prox Cx 20%. mLAD 70% & ostial D1 70%--> DES PCI LAD (Synergy DES 2.5 x 28 --2.8 mm)     Interval History:   Courtney Mcconnell returns here today for follow-up stating that she is not having any further episodes of chest tightness or pressure.  She has a baseline shortness of breath with COPD which seems to be stable (quit smoking 2 years ago).  She has limited ability to walk because of previous leg injury (right knee  painful and swollen) but also with her seizure disorder triggered by being outside.  She does not go outside to do much walking, just as walking around the house.   Despite her baseline dyspnea, she denies any worsening exertional dyspnea or chest pain/pressure.  No heart failure symptoms and no palpitations.  Baseline fatigue but that has not changed.  CV Review of Symptoms (Summary) Cardiovascular ROS: positive for - dyspnea on exertion, shortness of breath and This is related to her COPD, predates her MI.  Has mild lower extremity swelling more located around her right knee.  Poor balance. negative for - chest pain, irregular heartbeat, orthopnea, palpitations, paroxysmal nocturnal dyspnea, rapid heart rate or Syncope/near syncope, TIA/amaurosis fugax.  Does not walk enough to note claudication  The patient does not have symptoms concerning for COVID-19 infection (fever, chills, cough, or new shortness of breath).  The patient is practicing social distancing & Masking.    When questioned about COVID-19 vaccine, she indicates that she does not like shots and has no interested even getting flu shot.  She does not go outside and get around many people therefore is not worried about it.  He talked about this for a couple minutes, but she did not seem to be interested.   REVIEWED OF SYSTEMS   Review of Systems  Constitutional: Positive for malaise/fatigue (Baseline sedentary) and weight loss (Weight does seem to be down from [place] to change diet).  HENT: Positive for congestion. Negative for nosebleeds.  Respiratory: Positive for shortness of breath and wheezing. Negative for cough (Less prominence smoker's cough) and sputum production.        Baseline COPD  Gastrointestinal: Negative for abdominal pain, blood in stool and melena.  Genitourinary: Negative for hematuria.  Musculoskeletal: Positive for falls (Had a near fall with loss of balance.) and joint pain (Right knee).    Neurological: Positive for dizziness (Some positional, mostly because of poor balance). Negative for focal weakness, seizures (Has not had seizures since her MI) and headaches.  Psychiatric/Behavioral: Positive for memory loss. Negative for depression. The patient is not nervous/anxious and does not have insomnia.    I have reviewed and (if needed) personally updated the patient's problem list, medications, allergies, past medical and surgical history, social and family history.   PAST MEDICAL HISTORY   Past Medical History:  Diagnosis Date  . Asthma   . Bursitis of left hip    right  . COPD (chronic obstructive pulmonary disease) ([place])   . GERD (gastroesophageal reflux disease)   . Headache(784.0)   . Hyperlipidemia   . Hypertension   . NSTEMI (non-ST elevated myocardial infarction) ([place]) 02/11/2019   PCI/DES x1 to mLAD, focal ostial 70% lesion in 1st diag  . Pneumonia   . Seizure ([place])    most recent 12/12/14  . Sleep apnea     PAST SURGICAL HISTORY   Past Surgical History:  Procedure Laterality Date  . ABDOMINAL HYSTERECTOMY    . APPENDECTOMY    . CATARACT EXTRACTION Bilateral   . CHOLECYSTECTOMY    . CORONARY STENT INTERVENTION N/A 02/11/2019   Procedure: CORONARY STENT INTERVENTION;  Surgeon: Burnell Blanks, MD;  Location: [facility];; mid LAD 70% -- DES PCI (Synergy DES 2.5 x 28 --2.8 mm)  . heal spur     right  . INTRAVASCULAR PRESSURE WIRE/FFR STUDY N/A 02/11/2019   Procedure: INTRAVASCULAR PRESSURE WIRE/FFR STUDY;  Surgeon: Burnell Blanks, MD;  Location: [facility];  Service: Cardiovascular;  Laterality: N/A;  . LASER PHOTO ABLATION Right 08/27/2013   Procedure: LASER PHOTO ABLATION;  Surgeon: Hayden Pedro, MD;  Location: [facility];  Service: Ophthalmology;  Laterality: Right;  Headscope laser AND Endolaser  . LEFT HEART CATH AND CORONARY ANGIOGRAPHY N/A 02/11/2019   Procedure: LEFT HEART CATH AND CORONARY ANGIOGRAPHY;  Surgeon:  Burnell Blanks, MD;  Location: [facility];;; prox RCA 20%. Ost-prox Cx 20%. ostial D1 70% & mLAD 70% => (DES PCI of LAD)  . MEMBRANE PEEL Right 08/27/2013   Procedure: MEMBRANE PEEL;  Surgeon: Hayden Pedro, MD;  Location: [facility];  Service: Ophthalmology;  Laterality: Right;  . PARS PLANA VITRECTOMY Right 08/27/2013   Procedure: PARS PLANA VITRECTOMY WITH 25 GAUGE;  Surgeon: Hayden Pedro, MD;  Location: [facility];  Service: Ophthalmology;  Laterality: Right;    MEDICATIONS/ALLERGIES   Current Meds  Medication Sig  . albuterol (VENTOLIN HFA) 108 (90 Base) MCG/ACT inhaler INHALE TWO PUFFS INTO THE LUNGS EVERY 6 HOURS AS NEEDED FOR WHEEZING OR SHORTNESS OF BREATH (Patient taking differently: Inhale 2 puffs into the lungs every 6 (six) hours as needed for wheezing. )  . aspirin EC 81 MG EC tablet Take 1 tablet (81 mg total) by mouth daily.  Marland Kitchen atorvastatin (LIPITOR) 40 MG tablet Take 1 tablet (40 mg total) by mouth daily at 6 PM.  . carvedilol (COREG) 3.125 MG tablet TAKE 1 TABLET BY MOUTH TWICE DAILY WITH A MEAL  .  nitroGLYCERIN (NITROSTAT) 0.4 MG SL tablet Place 1 tablet (0.4 mg total) under the tongue every 5 (five) minutes as needed for chest pain.  Marland Kitchen omeprazole (PRILOSEC OTC) 20 MG tablet Take 20 mg by mouth daily.  . ticagrelor (BRILINTA) 90 MG TABS tablet Take 1 tablet (90 mg total) by mouth 2 (two) times daily.  Marland Kitchen tiotropium (SPIRIVA HANDIHALER) 18 MCG inhalation capsule Place 1 capsule (18 mcg total) into inhaler and inhale daily.  Marland Kitchen VIMPAT 100 MG TABS Take 300 mg by mouth 2 (two) times daily.  Marland Kitchen zonisamide (ZONEGRAN) 100 MG capsule Take 200 mg by mouth at bedtime.     Allergies  Allergen Reactions  . Bee Venom Swelling  . Amoxicillin-Pot Clavulanate Rash  . Doxycycline Hyclate Rash    Rash and itching    SOCIAL HISTORY/FAMILY HISTORY   Reviewed in Epic:  Social History   Tobacco Use  . Smoking status: Former Smoker    Packs/day: 0.25    Years: 42.00    Pack  years: 10.50    Types: Cigarettes    Quit date: 03/20/2014    Years since quitting: 5.1  . Smokeless tobacco: Never Used  . Tobacco comment: 01/21/15 restarted smoking 4 mos ago  Substance Use Topics  . Alcohol use: No    Alcohol/week: 0.0 standard drinks  . Drug use: No   Social History   Social History Narrative   Patient lives at home with family.   Caffeine Use: 1 pot and a half a day   Family History  Problem Relation Age of Onset  . Diabetes Father   . Hypertension Father   . Hyperlipidemia Father   . COPD Mother   . Diabetes Brother   . Stroke Paternal Grandmother   . Cancer Sister   . Cancer Paternal Uncle   . Colon cancer Neg Hx     OBJCTIVE -PE, EKG, labs   Wt Readings from Last 3 Encounters:  05/18/19 226 lb 6.4 oz (102.7 kg)  02/20/19 231 lb 3.2 oz (104.9 kg)  02/18/19 234 lb 3.2 oz (106.2 kg)    Physical Exam: BP (!) 144/88   Pulse (!) 56   Ht 5\' 4"  (1.626 m)   Wt 226 lb 6.4 oz (102.7 kg)   SpO2 96%   BMI 38.86 kg/m  Physical Exam  Constitutional: She is oriented to person, place, and time. She appears well-developed. No distress.  Moderate-morbidly obese with a BMI of 39 and significant risk factors.  Well-groomed.  Nontoxic  HENT:  Head: Normocephalic and atraumatic.  Neck: No hepatojugular reflux and no JVD present. Carotid bruit is not present.  Cardiovascular: Normal rate, regular rhythm, S1 normal, S2 normal and intact distal pulses.  No extrasystoles are present. PMI is not displaced. Exam reveals distant heart sounds. Exam reveals no gallop and no friction rub.  No murmur heard. Pulmonary/Chest: Effort normal. No respiratory distress. She has no wheezes. She has rales.  Distant breath sounds, but clear.  Mild interstitial sounds.  Abdominal: Soft. Bowel sounds are normal. She exhibits no distension. There is no abdominal tenderness. There is no rebound.  Musculoskeletal:        General: Deformity (Right knee does seem a little bit more  swollen than left.  Is tender.) and edema (Trivial ankle edema) present. Normal range of motion.     Cervical back: Normal range of motion and neck supple.  Neurological: She is oriented to person, place, and time.  Psychiatric: Her behavior is normal.  Somewhat flat/blunted affect.  Normal mood; does not seem to have great conceptualization of her condition  Vitals reviewed.   Adult ECG Report Not checked  Recent Labs: Did not get lipids checked yet as instructed today. Lab Results  Component Value Date   CHOL 114 02/20/2019   HDL 42 02/20/2019   LDLCALC 52 02/20/2019   LDLDIRECT 151 (H) 07/06/2015   TRIG 109 02/20/2019   CHOLHDL 2.7 02/20/2019   Lab Results  Component Value Date   CREATININE 0.87 02/20/2019   BUN 12 02/20/2019   NA 138 02/20/2019   K 3.8 02/20/2019   CL 101 02/20/2019   CO2 23 02/20/2019   Lab Results  Component Value Date   TSH 3.299 02/11/2019    ASSESSMENT/PLAN    Problem List Items Addressed This Visit    Presence of drug coated stent in LAD coronary artery (Chronic)    28 mm DES stent in the LAD-PCI performed in setting of ACS.   Residual ostial diagonal disease treated medically.   Continue DAPT x at least 6 months, but can stop aspirin after 6 months which would be June.  We will probably continue ticagrelor for the second year, but reduce dose of 60 mg twice daily starting in January 2022  As of January 2022, would be okay to hold ticagrelor 5 for 7 days preop for surgeries.    Otherwise, only for urgent/emergent procedures would be okay to hold 5 days preop with the understanding that there is an increased risk of stent thrombosis.       Essential hypertension (Chronic)    Blood pressure still elevated today at 144/88.  I suspect that there is a component of whitecoat syndrome, but she is only on carvedilol 3.125 mg twice daily.  Can titrate this up further because of heart rate of 56.  Plan: Start losartan 25 mg daily for afterload  reduction.      Relevant Medications   losartan (COZAAR) 25 MG tablet   Hyperlipidemia with target LDL less than 70 -> CAD (Chronic)    Lipids look pretty good in January, but would need to have follow-up labs to see how she is doing on atorvastatin.  Seems to be tolerating it relatively well.    I have ordered lipids and chemistry for follow-up to titrate medications.      Relevant Medications   losartan (COZAAR) 25 MG tablet   Other Relevant Orders   Lipid panel   Comprehensive metabolic panel   Coronary artery disease involving native coronary artery of native heart with angina pectoris ([place]) - Primary (Chronic)    Presented with unstable angina/non-STEMI in December 2020, found to have LAD lesion treated with DES PCI.  She does have an ostial diagonal lesion which was read as 70%, but angiographically according to my view does not seem to be as significant.  Would not be favorable PTCA target and would best be treated medically.  Thankfully, she is not having any further angina and is not overly active.  Plan:  --> We will send referral to [facility]  Continue low-dose beta-blocker (unable to titrate further because of bradycardia.  Continue statin.  Continue DAPT x at least 6 months, but can stop aspirin after 6 months which would be June.  We will probably continue ticagrelor for the second year, but reduce dose of 60 mg twice daily starting in January 2022  As of January 2022, would be okay to hold ticagrelor 5 for  7 days preop for surgeries.    Otherwise, only for urgent/emergent procedures would be okay to hold 5 days preop with the understanding that there is an increased risk of stent thrombosis.      Relevant Medications   losartan (COZAAR) 25 MG tablet   History of non-ST elevation myocardial infarction (NSTEMI)    Mild non-STEMI noted presentation.  No wall motion normality on echo and no heart failure symptoms.      Relevant Orders     Lipid panel   Comprehensive metabolic panel      Monitor for signs of claudication as she is able to do more walking.   COVID-19 Education: The signs and symptoms of COVID-19 were discussed with the patient and how to seek care for testing (follow up with PCP or arrange E-visit).   The importance of social distancing was discussed today.  I spent a total of 84minutes with the patient. >  50% of the time was spent in direct patient consultation.  Additional time spent with chart review  / charting (studies, outside notes, etc): 10 Total Time: 36 min   Current medicines are reviewed at length with the patient today.  (+/- concerns) not interested in COVID-19 vaccine.  Notice: This dictation was prepared with Dragon dictation along with smaller phrase technology. Any transcriptional errors that result from this process are unintentional and may not be corrected upon review.  Patient Instructions / Medication Changes & Studies & Tests Ordered   Patient Instructions  Medication Instructions:   losartan 25 mg  One tablet daily   *If you need a refill on your cardiac medications before your next appointment, please call your pharmacy*   Lab Work:   please have primary added these labs  As well CMP, LIPID    If you have labs (blood work) drawn today and your tests are completely normal, you will receive your results only by: Marland Kitchen MyChart Message (if you have MyChart) OR . A paper copy in the mail If you have any lab test that is abnormal or we need to change your treatment, we will call you to review the results.   Testing/Procedures: NOT  NEEDED   Follow-Up: At [facility], you and your health needs are our priority.  As part of our continuing mission to provide you with exceptional heart care, we have created designated Provider Care Teams.  These Care Teams include your primary Cardiologist (physician) and Advanced Practice Providers (APPs -  Physician Assistants and Nurse  Practitioners) who all work together to provide you with the care you need, when you need it.    Your next appointment:   4 month(s)- Aug 2021   The format for your next appointment:   In Person  Provider:   Jory Sims, DNP, ANP   in 8 months  DEC 2021- schedule at [facility] office     Other Instructions  please have primary added these labs  As well CMP, LIPID    contact number for Cardiac Rehab  At [facility] --  [phone]    Studies Ordered:   Orders Placed This Encounter  Procedures  . Lipid panel  . Comprehensive metabolic panel     Glenetta Hew, M.D., M.S. Interventional Cardiologist   Pager # [phone] Phone # [phone] [address] [place], [place] [address]   Thank you for choosing Heartcare at [facility]!!

## 2019-05-22 ENCOUNTER — Encounter: Payer: Self-pay | Admitting: Cardiology

## 2019-05-22 DIAGNOSIS — I25759 Atherosclerosis of native coronary artery of transplanted heart with unspecified angina pectoris: Secondary | ICD-10-CM | POA: Insufficient documentation

## 2019-05-22 DIAGNOSIS — I25119 Atherosclerotic heart disease of native coronary artery with unspecified angina pectoris: Secondary | ICD-10-CM | POA: Insufficient documentation

## 2019-05-22 NOTE — Assessment & Plan Note (Addendum)
Presented with unstable angina/non-STEMI in December 2020, found to have LAD lesion treated with DES PCI.  She does have an ostial diagonal lesion which was read as 70%, but angiographically according to my view does not seem to be as significant.  Would not be favorable PTCA target and would best be treated medically.  Thankfully, she is not having any further angina and is not overly active.  Plan:  --> We will send referral to [facility]  Continue low-dose beta-blocker (unable to titrate further because of bradycardia.  Continue statin.  Continue DAPT x at least 6 months, but can stop aspirin after 6 months which would be June.  We will probably continue ticagrelor for the second year, but reduce dose of 60 mg twice daily starting in January 2022  As of January 2022, would be okay to hold ticagrelor 5 for 7 days preop for surgeries.    Otherwise, only for urgent/emergent procedures would be okay to hold 5 days preop with the understanding that there is an increased risk of stent thrombosis.

## 2019-05-22 NOTE — Assessment & Plan Note (Signed)
Blood pressure still elevated today at 144/88.  I suspect that there is a component of whitecoat syndrome, but she is only on carvedilol 3.125 mg twice daily.  Can titrate this up further because of heart rate of 56.  Plan: Start losartan 25 mg daily for afterload reduction.

## 2019-05-22 NOTE — Assessment & Plan Note (Signed)
Mild non-STEMI noted presentation.  No wall motion normality on echo and no heart failure symptoms.

## 2019-05-22 NOTE — Assessment & Plan Note (Signed)
Lipids look pretty good in January, but would need to have follow-up labs to see how she is doing on atorvastatin.  Seems to be tolerating it relatively well.    I have ordered lipids and chemistry for follow-up to titrate medications.

## 2019-05-22 NOTE — Assessment & Plan Note (Signed)
28 mm DES stent in the LAD-PCI performed in setting of ACS.   Residual ostial diagonal disease treated medically.   Continue DAPT x at least 6 months, but can stop aspirin after 6 months which would be June.  We will probably continue ticagrelor for the second year, but reduce dose of 60 mg twice daily starting in January 2022  As of January 2022, would be okay to hold ticagrelor 5 for 7 days preop for surgeries.    Otherwise, only for urgent/emergent procedures would be okay to hold 5 days preop with the understanding that there is an increased risk of stent thrombosis.

## 2019-05-26 ENCOUNTER — Other Ambulatory Visit: Payer: Self-pay

## 2019-05-26 ENCOUNTER — Other Ambulatory Visit: Payer: Medicare Other

## 2019-05-26 DIAGNOSIS — E785 Hyperlipidemia, unspecified: Secondary | ICD-10-CM | POA: Diagnosis not present

## 2019-05-26 DIAGNOSIS — I2510 Atherosclerotic heart disease of native coronary artery without angina pectoris: Secondary | ICD-10-CM | POA: Diagnosis not present

## 2019-05-26 DIAGNOSIS — I214 Non-ST elevation (NSTEMI) myocardial infarction: Secondary | ICD-10-CM | POA: Diagnosis not present

## 2019-05-27 ENCOUNTER — Ambulatory Visit (INDEPENDENT_AMBULATORY_CARE_PROVIDER_SITE_OTHER): Payer: Medicare Other | Admitting: Family Medicine

## 2019-05-27 ENCOUNTER — Encounter: Payer: Self-pay | Admitting: Family Medicine

## 2019-05-27 DIAGNOSIS — I10 Essential (primary) hypertension: Secondary | ICD-10-CM | POA: Diagnosis not present

## 2019-05-27 DIAGNOSIS — I25119 Atherosclerotic heart disease of native coronary artery with unspecified angina pectoris: Secondary | ICD-10-CM | POA: Diagnosis not present

## 2019-05-27 DIAGNOSIS — E785 Hyperlipidemia, unspecified: Secondary | ICD-10-CM | POA: Diagnosis not present

## 2019-05-27 DIAGNOSIS — J439 Emphysema, unspecified: Secondary | ICD-10-CM

## 2019-05-27 LAB — COMPREHENSIVE METABOLIC PANEL
ALT: 5 IU/L (ref 0–32)
AST: 9 IU/L (ref 0–40)
Albumin/Globulin Ratio: 1.5 (ref 1.2–2.2)
Albumin: 4.1 g/dL (ref 3.8–4.8)
Alkaline Phosphatase: 122 IU/L — ABNORMAL HIGH (ref 39–117)
BUN/Creatinine Ratio: 13 (ref 12–28)
BUN: 11 mg/dL (ref 8–27)
Bilirubin Total: 0.4 mg/dL (ref 0.0–1.2)
CO2: 22 mmol/L (ref 20–29)
Calcium: 9.4 mg/dL (ref 8.7–10.3)
Chloride: 107 mmol/L — ABNORMAL HIGH (ref 96–106)
Creatinine, Ser: 0.87 mg/dL (ref 0.57–1.00)
GFR calc Af Amer: 82 mL/min/{1.73_m2} (ref >59)
GFR calc non Af Amer: 71 mL/min/{1.73_m2} (ref >59)
Globulin, Total: 2.7 g/dL (ref 1.5–4.5)
Glucose: 107 mg/dL — ABNORMAL HIGH (ref 65–99)
Potassium: 4.2 mmol/L (ref 3.5–5.2)
Sodium: 143 mmol/L (ref 134–144)
Total Protein: 6.8 g/dL (ref 6.0–8.5)

## 2019-05-27 LAB — LIPID PANEL
Chol/HDL Ratio: 2.6 ratio (ref 0.0–4.4)
Cholesterol, Total: 127 mg/dL (ref 100–199)
HDL: 48 mg/dL (ref >39)
LDL Chol Calc (NIH): 60 mg/dL (ref 0–99)
Triglycerides: 101 mg/dL (ref 0–149)
VLDL Cholesterol Cal: 19 mg/dL (ref 5–40)

## 2019-05-27 MED ORDER — FLUTICASONE-SALMETEROL 100-50 MCG/DOSE IN AEPB: 1.0000 | INHALATION_SPRAY | Freq: Two times a day (BID) | RESPIRATORY_TRACT | 2 refills | Status: DC

## 2019-05-27 NOTE — Progress Notes (Signed)
Virtual Visit via telephone Note  I connected with Courtney Mcconnell on 05/27/19 at [place] by telephone and verified that I am speaking with the correct person using two identifiers. Courtney Mcconnell is currently located at home and no other people are currently with her during visit. The provider, Fransisca Kaufmann Valkyrie Guardiola, MD is located in their office at time of visit.  Call ended at [phone]  I discussed the limitations, risks, security and privacy concerns of performing an evaluation and management service by telephone and the availability of in person appointments. I also discussed with the patient that there may be a patient responsible charge related to this service. The patient expressed understanding and agreed to proceed.   History and Present Illness: Hypertension Patient is currently on losartan and carvedilol, and their blood pressure today is 134/69. Patient denies any lightheadedness or dizziness. Patient denies headaches, blurred vision, chest pains, shortness of breath, or weakness. Denies any side effects from medication and is content with current medication.   Hyperlipidemia Patient is coming in for recheck of his hyperlipidemia. The patient is currently taking atorvastatin. They deny any issues with myalgias or history of liver damage from it. They deny any focal numbness or weakness or chest pain.   COPD Patient is coming in for COPD recheck today.  He is currently on spiriva.  He has a mild chronic cough but denies any major coughing spells or wheezing spells.  He has 1nighttime symptoms per week and 3daytime symptoms per week currently. She has wheezing and coughing spells about 1 time per month.  1. Pulmonary emphysema, unspecified emphysema type ([place])   2. Hyperlipidemia with target LDL less than 70 -> CAD   3. Essential hypertension     Outpatient Encounter Medications as of 05/27/2019  Medication Sig  . albuterol (VENTOLIN HFA) 108 (90 Base) MCG/ACT inhaler INHALE TWO PUFFS  INTO THE LUNGS EVERY 6 HOURS AS NEEDED FOR WHEEZING OR SHORTNESS OF BREATH (Patient taking differently: Inhale 2 puffs into the lungs every 6 (six) hours as needed for wheezing. )  . aspirin EC 81 MG EC tablet Take 1 tablet (81 mg total) by mouth daily.  Marland Kitchen atorvastatin (LIPITOR) 40 MG tablet Take 1 tablet (40 mg total) by mouth daily at 6 PM.  . carvedilol (COREG) 3.125 MG tablet TAKE 1 TABLET BY MOUTH TWICE DAILY WITH A MEAL  . Fluticasone-Salmeterol (ADVAIR DISKUS) 100-50 MCG/DOSE AEPB Inhale 1 puff into the lungs in the morning and at bedtime.  Marland Kitchen losartan (COZAAR) 25 MG tablet Take 1 tablet (25 mg total) by mouth daily.  . nitroGLYCERIN (NITROSTAT) 0.4 MG SL tablet Place 1 tablet (0.4 mg total) under the tongue every 5 (five) minutes as needed for chest pain.  Marland Kitchen omeprazole (PRILOSEC OTC) 20 MG tablet Take 20 mg by mouth daily.  . ticagrelor (BRILINTA) 90 MG TABS tablet Take 1 tablet (90 mg total) by mouth 2 (two) times daily.  Marland Kitchen tiotropium (SPIRIVA HANDIHALER) 18 MCG inhalation capsule Place 1 capsule (18 mcg total) into inhaler and inhale daily.  Marland Kitchen VIMPAT 100 MG TABS Take 300 mg by mouth 2 (two) times daily.  Marland Kitchen zonisamide (ZONEGRAN) 100 MG capsule Take 200 mg by mouth at bedtime.    No facility-administered encounter medications on file as of 05/27/2019.    Review of Systems  Constitutional: Negative for chills and fever.  HENT: Negative for congestion.   Eyes: Negative for visual disturbance.  Respiratory: Positive for cough, shortness of breath and wheezing.  Negative for chest tightness.   Cardiovascular: Negative for chest pain and leg swelling.  Genitourinary: Negative for difficulty urinating and dysuria.  Musculoskeletal: Negative for back pain and gait problem.  Skin: Negative for rash.  Neurological: Negative for light-headedness and headaches.  Psychiatric/Behavioral: Negative for agitation and behavioral problems.  All other systems reviewed and are  negative.   Observations/Objective: Patient sounds comfortable and in no acute distress  Assessment and Plan: Problem List Items Addressed This Visit      Cardiovascular and Mediastinum   Essential hypertension (Chronic)     Respiratory   Pulmonary emphysema (HCC) - Primary   Relevant Medications   Fluticasone-Salmeterol (ADVAIR DISKUS) 100-50 MCG/DOSE AEPB     Other   Hyperlipidemia with target LDL less than 70 -> CAD (Chronic)      Added advair to help with shortness of breath symptoms.  Saw cardiologist and things look good and blood work from yesterday looks good.  No other change medication Follow up plan: Return in about 3 months (around 08/26/2019), or if symptoms worsen or fail to improve, for copd and htn hld.     I discussed the assessment and treatment plan with the patient. The patient was provided an opportunity to ask questions and all were answered. The patient agreed with the plan and demonstrated an understanding of the instructions.   The patient was advised to call back or seek an in-person evaluation if the symptoms worsen or if the condition fails to improve as anticipated.  The above assessment and management plan was discussed with the patient. The patient verbalized understanding of and has agreed to the management plan. Patient is aware to call the clinic if symptoms persist or worsen. Patient is aware when to return to the clinic for a follow-up visit. Patient educated on when it is appropriate to go to the emergency department.    I provided 13 minutes of non-face-to-face time during this encounter.    Worthy Rancher, MD

## 2019-06-05 ENCOUNTER — Other Ambulatory Visit: Payer: Self-pay | Admitting: Cardiology

## 2019-06-17 IMAGING — DX DG CHEST 2V
2 series · 2 of 2 positions shown · non-contrast
Comparison: 08/31/2017

CLINICAL DATA: Left chest pain and shortness of breath for 3 hours.
Asthma.

EXAM:
CHEST - 2 VIEW

[chest pa]
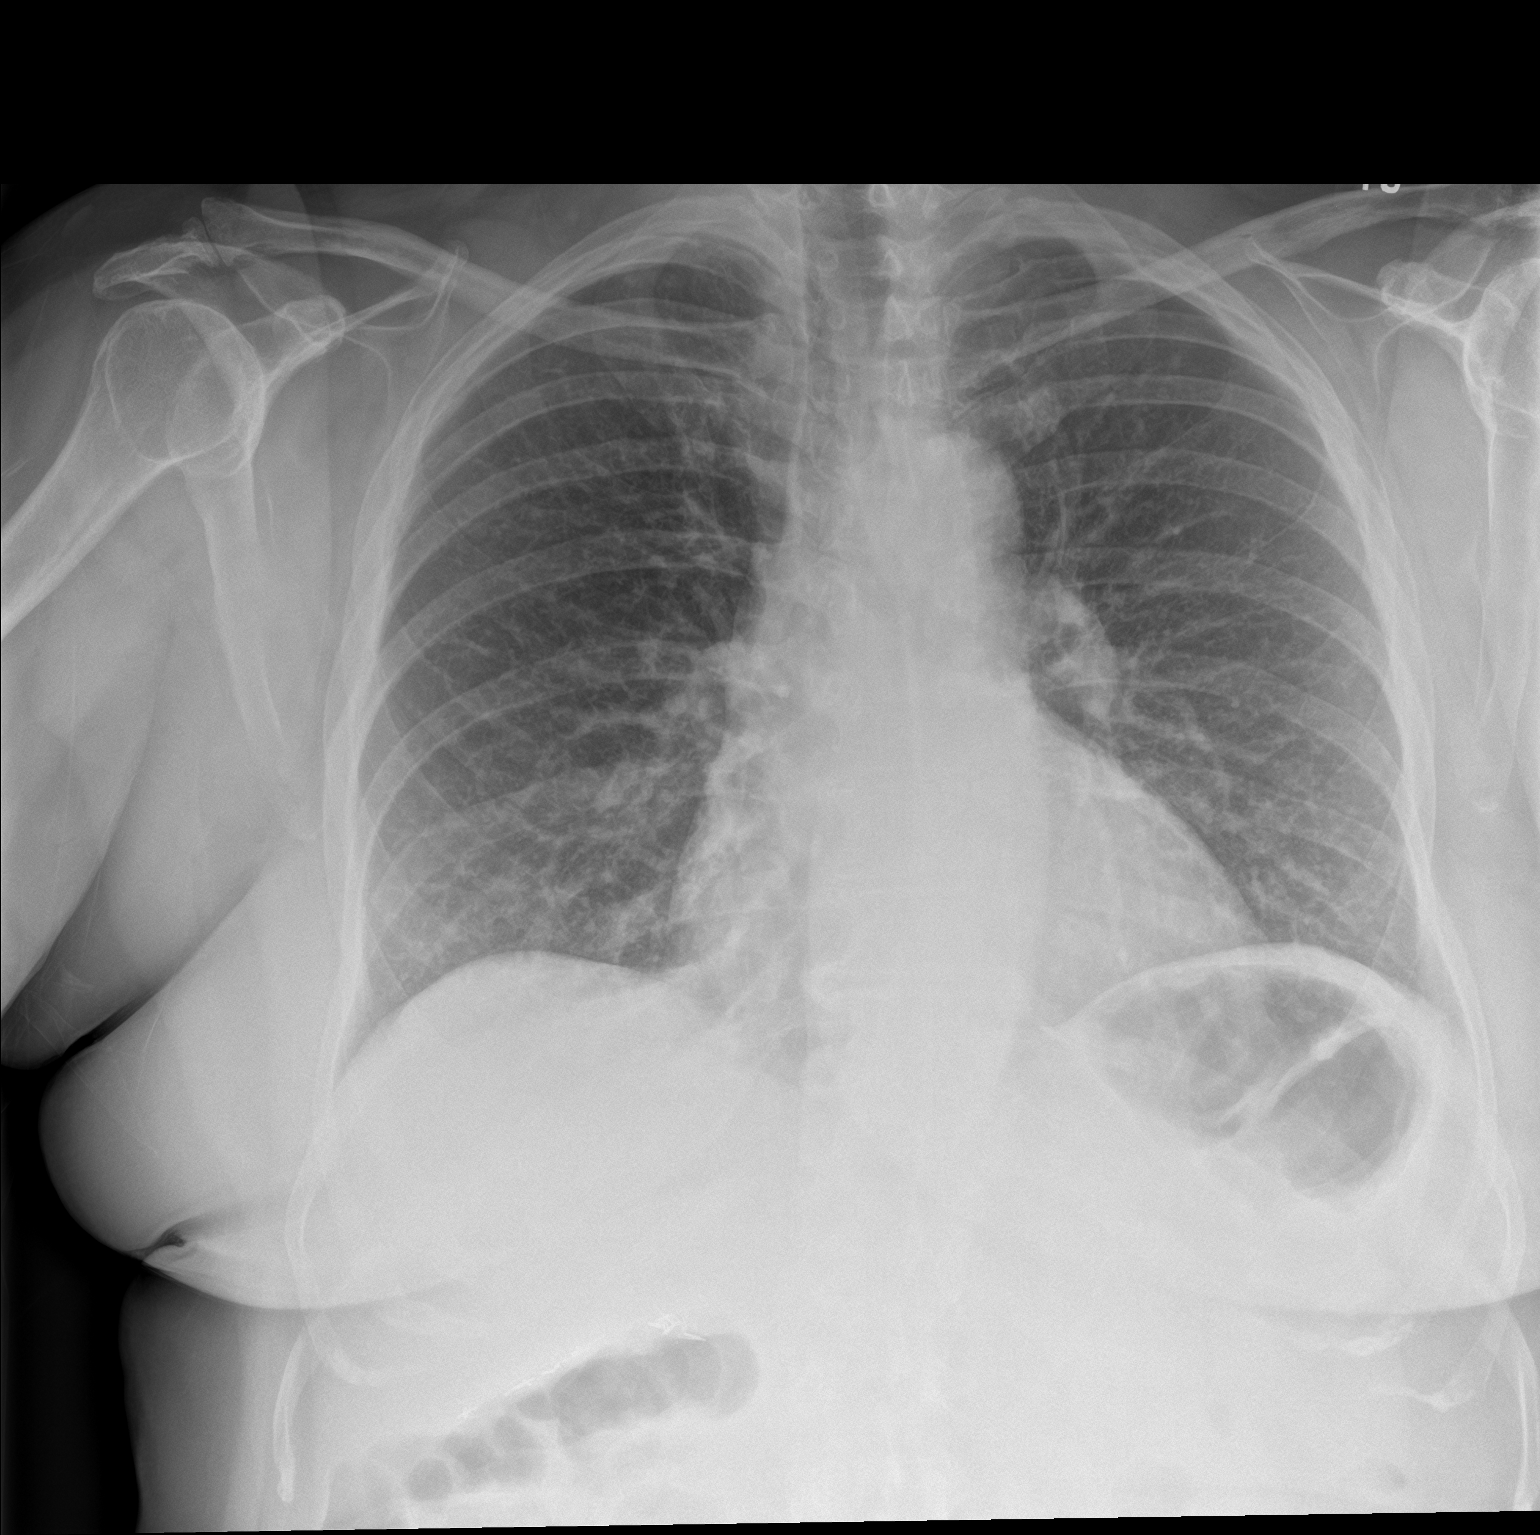

[chest lat]
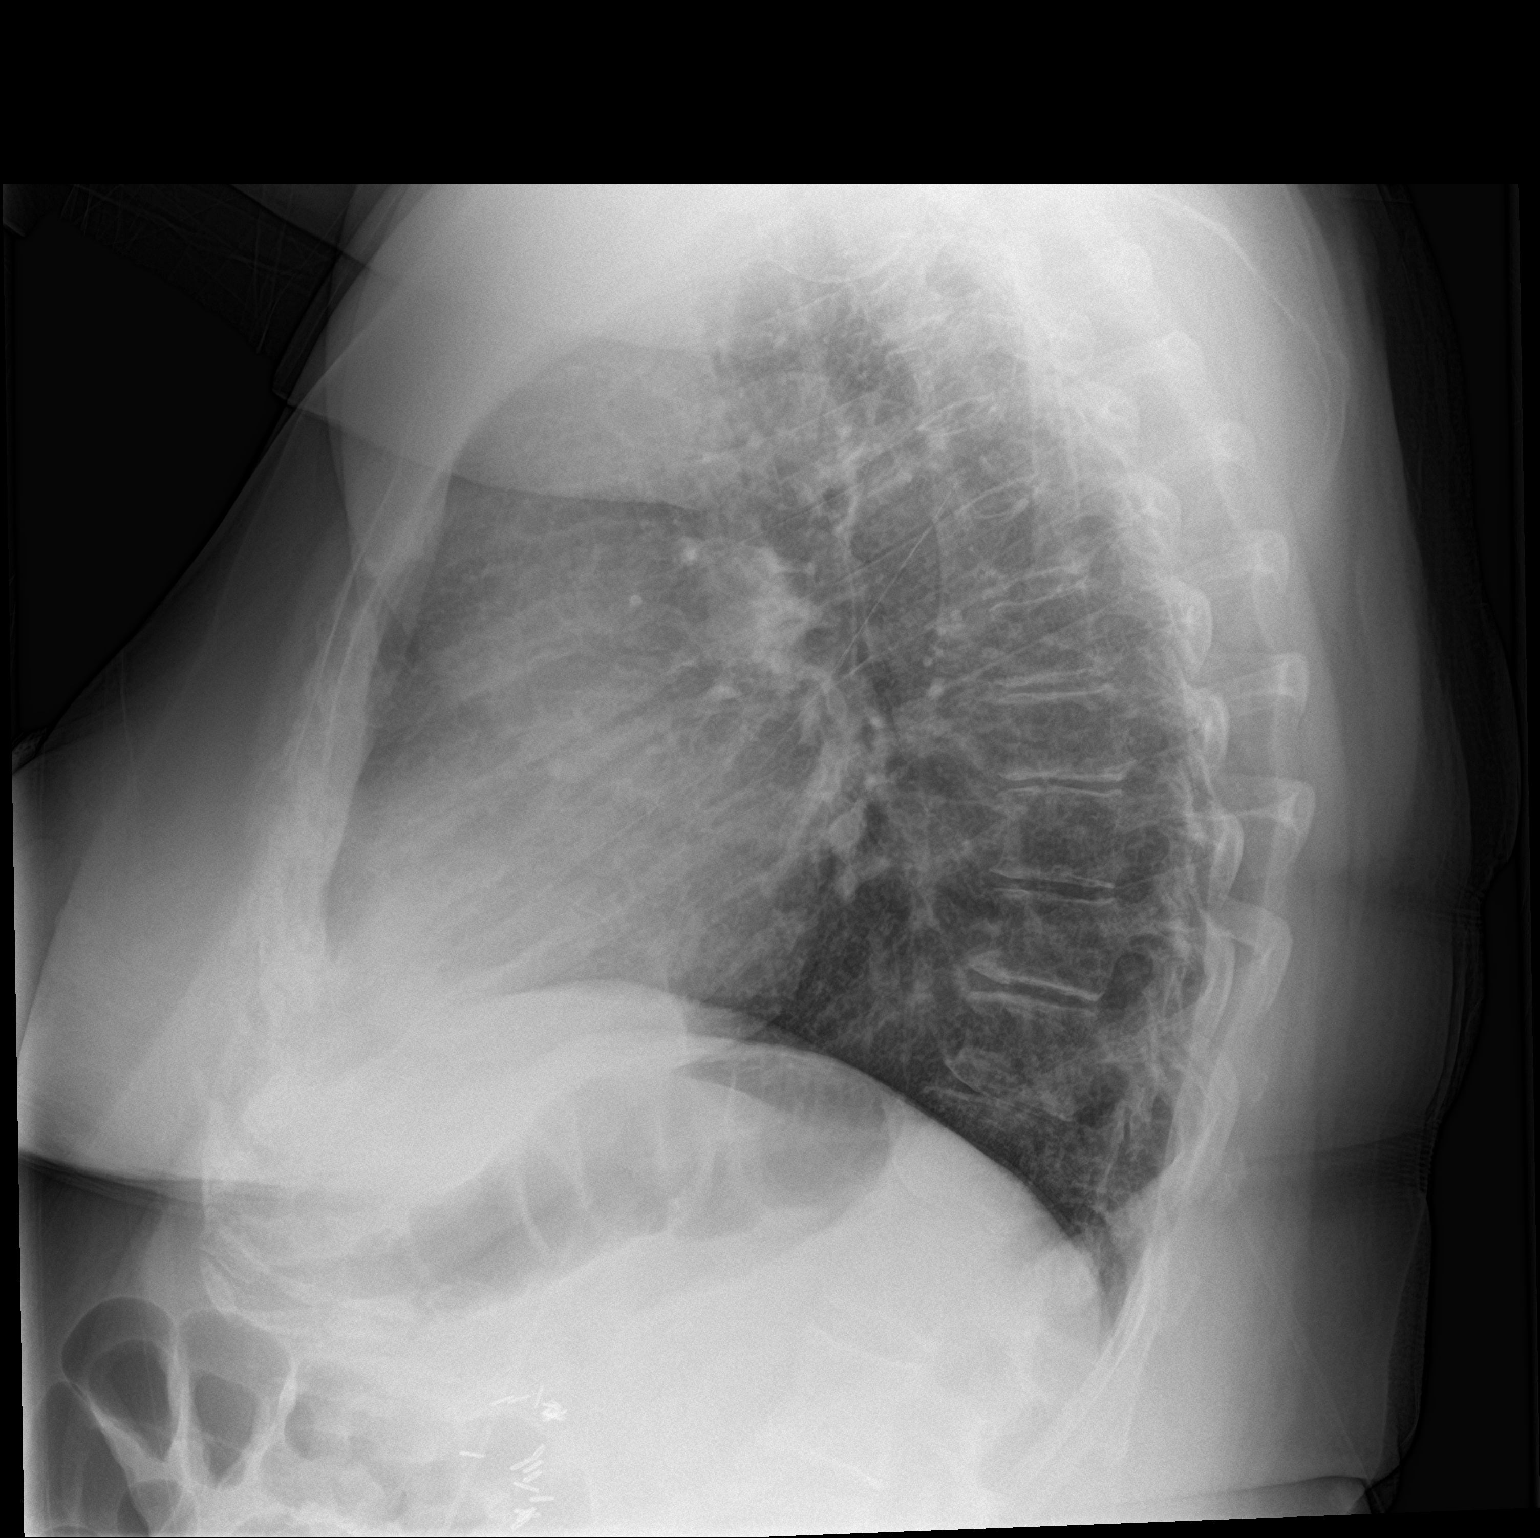

[2 of 2 positions shown; findings below may reference images not displayed]

FINDINGS: The heart size and mediastinal contours are within normal limits.
Both lungs are clear. The visualized skeletal structures are
unremarkable.
IMPRESSION: No active cardiopulmonary disease.

## 2019-06-25 ENCOUNTER — Other Ambulatory Visit: Payer: Self-pay | Admitting: Cardiology

## 2019-08-04 DIAGNOSIS — R0683 Snoring: Secondary | ICD-10-CM | POA: Diagnosis not present

## 2019-08-04 DIAGNOSIS — G40109 Localization-related (focal) (partial) symptomatic epilepsy and epileptic syndromes with simple partial seizures, not intractable, without status epilepticus: Secondary | ICD-10-CM | POA: Diagnosis not present

## 2019-08-04 DIAGNOSIS — I219 Acute myocardial infarction, unspecified: Secondary | ICD-10-CM | POA: Diagnosis not present

## 2019-08-23 ENCOUNTER — Other Ambulatory Visit: Payer: Self-pay | Admitting: Cardiology

## 2019-09-12 ENCOUNTER — Emergency Department (HOSPITAL_COMMUNITY)
Admission: EM | Admit: 2019-09-12 | Discharge: 2019-09-13 | Disposition: A | Discharge status: Home / Self Care | Payer: Medicare Other | Attending: Emergency Medicine | Admitting: Emergency Medicine

## 2019-09-12 ENCOUNTER — Encounter (HOSPITAL_COMMUNITY): Payer: Self-pay

## 2019-09-12 ENCOUNTER — Other Ambulatory Visit: Payer: Self-pay

## 2019-09-12 DIAGNOSIS — I10 Essential (primary) hypertension: Secondary | ICD-10-CM | POA: Insufficient documentation

## 2019-09-12 DIAGNOSIS — I2510 Atherosclerotic heart disease of native coronary artery without angina pectoris: Secondary | ICD-10-CM | POA: Insufficient documentation

## 2019-09-12 DIAGNOSIS — Z79899 Other long term (current) drug therapy: Secondary | ICD-10-CM | POA: Insufficient documentation

## 2019-09-12 DIAGNOSIS — J449 Chronic obstructive pulmonary disease, unspecified: Secondary | ICD-10-CM | POA: Diagnosis not present

## 2019-09-12 DIAGNOSIS — Z7951 Long term (current) use of inhaled steroids: Secondary | ICD-10-CM | POA: Diagnosis not present

## 2019-09-12 DIAGNOSIS — Z87891 Personal history of nicotine dependence: Secondary | ICD-10-CM | POA: Diagnosis not present

## 2019-09-12 DIAGNOSIS — B029 Zoster without complications: Secondary | ICD-10-CM | POA: Diagnosis not present

## 2019-09-12 DIAGNOSIS — Z7982 Long term (current) use of aspirin: Secondary | ICD-10-CM | POA: Diagnosis not present

## 2019-09-12 MED ORDER — VALACYCLOVIR HCL 1 G PO TABS: 1000.0000 mg | ORAL_TABLET | Freq: Three times a day (TID) | ORAL | 0 refills | Status: AC

## 2019-09-12 MED ORDER — VALACYCLOVIR HCL 500 MG PO TABS
1000.0000 mg | ORAL_TABLET | Freq: Once | ORAL | Status: AC
  Administered 2019-09-13: 1000 mg via ORAL
  Filled 2019-09-12: qty 2

## 2019-09-12 MED ORDER — NAPROXEN 500 MG PO TABS: 500.0000 mg | ORAL_TABLET | Freq: Two times a day (BID) | ORAL | 0 refills | Status: DC

## 2019-09-12 MED ORDER — OXYCODONE-ACETAMINOPHEN 5-325 MG PO TABS
1.0000 | ORAL_TABLET | Freq: Once | ORAL | Status: AC
  Administered 2019-09-13: 1 via ORAL
  Filled 2019-09-12: qty 1

## 2019-09-12 NOTE — ED Triage Notes (Signed)
Pt reports rash to left upper abdomen (under breast) that she noticed at the beginning of week. Pt reports rash (raised vesicles) that "itch and burn"  Rash is localized to this area. Rash appears like shingles.

## 2019-09-12 NOTE — Discharge Instructions (Addendum)
Take medications as prescribed.  Follow-up with your primary physician if pain is inadequately controlled.

## 2019-09-12 NOTE — ED Provider Notes (Signed)
[facility] EMERGENCY DEPARTMENT Provider Note   CSN: [id number] Arrival date & time: 09/12/19  2228     History Chief Complaint  Patient presents with  . Rash    ? shingles    Courtney Mcconnell is a 64 y.o. female.  HPI     This is a 64 year old female with a history of COPD, hypertension, hyperlipidemia, coronary artery disease who presents with a rash.  Patient reports 1 week history of a rash under her left breast.  She describes it as itchy and painful.  She has also noted vesicles.  She states her pain is 7 out of 10.  She has tried hydrocortisone cream with minimal relief.  She denies any systemic symptoms including fever.  No other rash.  She has not gotten the shingles vaccination.  Patient reports no new soaps, detergents, exposures.  Past Medical History:  Diagnosis Date  . Asthma   . Bursitis of left hip    right  . COPD (chronic obstructive pulmonary disease) ([place])   . GERD (gastroesophageal reflux disease)   . Headache(784.0)   . Hyperlipidemia   . Hypertension   . NSTEMI (non-ST elevated myocardial infarction) ([place]) 02/11/2019   PCI/DES x1 to mLAD, focal ostial 70% lesion in 1st diag  . Pneumonia   . Seizure ([place])    most recent 12/12/14  . Sleep apnea     Patient Active Problem List   Diagnosis Date Noted  . Coronary artery disease involving native coronary artery of native heart with angina pectoris ([place]) 05/22/2019  . Hyperlipidemia with target LDL less than 70 -> CAD 02/12/2019  . Presence of drug coated stent in LAD coronary artery 02/11/2019  . History of non-ST elevation myocardial infarction (NSTEMI) 02/10/2019  . Cerebral infarction, remote, resolved 06/27/2018  . Cellulitis 04/12/2018  . Cellulitis of left upper arm   . Pulmonary nodules 12/08/2015  . 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 Sleep apnea 04/06/2015  . Pulmonary emphysema ([place]) 03/02/2015  . Smoker 02/16/2015  . Seizure disorder ([place]) 01/21/2015  . Focal epilepsy with  impairment of consciousness ([place]) 12/13/2014  . Seizures ([place]) 04/18/2014  . Essential hypertension 04/18/2014  . 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 Tobacco use disorder 07/19/2012    Past Surgical History:  Procedure Laterality Date  . ABDOMINAL HYSTERECTOMY    . APPENDECTOMY    . CATARACT EXTRACTION Bilateral   . CHOLECYSTECTOMY    . CORONARY STENT INTERVENTION N/A 02/11/2019   Procedure: CORONARY STENT INTERVENTION;  Surgeon: Burnell Blanks, MD;  Location: [facility];; mid LAD 70% -- DES PCI (Synergy DES 2.5 x 28 --2.8 mm)  . heal spur     right  . INTRAVASCULAR PRESSURE WIRE/FFR STUDY N/A 02/11/2019   Procedure: INTRAVASCULAR PRESSURE WIRE/FFR STUDY;  Surgeon: Burnell Blanks, MD;  Location: [facility];  Service: Cardiovascular;  Laterality: N/A;  . LASER PHOTO ABLATION Right 08/27/2013   Procedure: LASER PHOTO ABLATION;  Surgeon: Hayden Pedro, MD;  Location: [facility];  Service: Ophthalmology;  Laterality: Right;  Headscope laser AND Endolaser  . LEFT HEART CATH AND CORONARY ANGIOGRAPHY N/A 02/11/2019   Procedure: LEFT HEART CATH AND CORONARY ANGIOGRAPHY;  Surgeon: Burnell Blanks, MD;  Location: [facility] INVASIVE [facility] LAB;;; prox RCA 20%. Ost-prox Cx 20%. ostial D1 70% & mLAD 70% => (DES PCI of LAD)  . MEMBRANE PEEL Right 08/27/2013   Procedure: MEMBRANE PEEL;  Surgeon: Hayden Pedro, MD;  Location: [facility]  OR;  Service: Ophthalmology;  Laterality: Right;  . PARS PLANA VITRECTOMY Right 08/27/2013   Procedure: PARS PLANA VITRECTOMY WITH 25 GAUGE;  Surgeon: Hayden Pedro, MD;  Location: [facility];  Service: Ophthalmology;  Laterality: Right;     OB History    Gravida  2   Para  2   Term  2   Preterm      AB      Living  3     SAB      TAB      Ectopic      Multiple      Live Births              Family History  Problem Relation Age of Onset  . Diabetes Father   . Hypertension Father   . Hyperlipidemia Father   . COPD  Mother   . Diabetes Brother   . Stroke Paternal Grandmother   . Cancer Sister   . Cancer Paternal Uncle   . Colon cancer Neg Hx     Social History   Tobacco Use  . Smoking status: Former Smoker    Packs/day: 0.25    Years: 42.00    Pack years: 10.50    Types: Cigarettes    Quit date: 03/20/2014    Years since quitting: 5.4  . Smokeless tobacco: Never Used  . Tobacco comment: 01/21/15 restarted smoking 4 mos ago  Vaping Use  . Vaping Use: Never used  Substance Use Topics  . Alcohol use: No    Alcohol/week: 0.0 standard drinks  . Drug use: No    Home Medications Prior to Admission medications   Medication Sig Start Date End Date Taking? Authorizing Provider  albuterol (VENTOLIN HFA) 108 (90 Base) MCG/ACT inhaler INHALE TWO PUFFS INTO THE LUNGS EVERY 6 HOURS AS NEEDED FOR WHEEZING OR SHORTNESS OF BREATH Patient taking differently: Inhale 2 puffs into the lungs every 6 (six) hours as needed for wheezing.  06/02/18   Dettinger, Fransisca Kaufmann, MD  aspirin EC 81 MG EC tablet Take 1 tablet (81 mg total) by mouth daily. 02/12/19   Cheryln Manly, NP  atorvastatin (LIPITOR) 40 MG tablet TAKE 1 TABLET BY MOUTH DAILY AT 6PM 06/09/19   Leonie Man, MD  carvedilol (COREG) 3.125 MG tablet TAKE 1 TABLET BY MOUTH TWICE DAILY WITH A MEAL 08/25/19   Leonie Man, MD  Fluticasone-Salmeterol (ADVAIR DISKUS) 100-50 MCG/DOSE AEPB Inhale 1 puff into the lungs in the morning and at bedtime. 05/27/19   Dettinger, Fransisca Kaufmann, MD  losartan (COZAAR) 25 MG tablet Take 1 tablet (25 mg total) by mouth daily. 05/18/19 08/16/19  Leonie Man, MD  naproxen (NAPROSYN) 500 MG tablet Take 1 tablet (500 mg total) by mouth 2 (two) times daily. 09/12/19   Charlea Nardo, Barbette Hair, MD  nitroGLYCERIN (NITROSTAT) 0.4 MG SL tablet Place 1 tablet (0.4 mg total) under the tongue every 5 (five) minutes as needed for chest pain. 07/30/18   Dettinger, Fransisca Kaufmann, MD  omeprazole (PRILOSEC OTC) 20 MG tablet Take 20 mg by mouth daily.     [provider]  ticagrelor (BRILINTA) 90 MG TABS tablet Take 1 tablet (90 mg total) by mouth 2 (two) times daily. 02/12/19   Cheryln Manly, NP  tiotropium (SPIRIVA HANDIHALER) 18 MCG inhalation capsule Place 1 capsule (18 mcg total) into inhaler and inhale daily. 02/20/19   Dettinger, Fransisca Kaufmann, MD  valACYclovir (VALTREX) 1000 MG tablet Take 1 tablet (1,000 mg  total) by mouth 3 (three) times daily for 7 days. 09/12/19 09/19/19  Thurlow Gallaga, Barbette Hair, MD  VIMPAT 100 MG TABS Take 300 mg by mouth 2 (two) times daily. 03/03/18   [provider]  zonisamide (ZONEGRAN) 100 MG capsule Take 200 mg by mouth at bedtime.  06/13/17   [provider]    Allergies    Bee venom, Amoxicillin-pot clavulanate, and Doxycycline hyclate  Review of Systems   Review of Systems  Constitutional: Negative for fever.  Respiratory: Negative for shortness of breath.   Cardiovascular: Negative for chest pain.  Skin: Positive for rash.  All other systems reviewed and are negative.   Physical Exam Updated Vital Signs BP (!) 175/82 (BP Location: Right Arm)   Pulse 62   Temp 98.5 F (36.9 C) (Oral)   Resp 18   Ht 1.676 m (5\' 6" )   Wt (!) 99.8 kg   SpO2 100%   BMI 35.51 kg/m   Physical Exam Vitals and nursing note reviewed.  Constitutional:      Appearance: She is well-developed. She is obese. She is not ill-appearing.  HENT:     Head: Normocephalic and atraumatic.     Nose: Nose normal.     Mouth/Throat:     Mouth: Mucous membranes are moist.  Eyes:     Pupils: Pupils are equal, round, and reactive to light.  Cardiovascular:     Rate and Rhythm: Normal rate and regular rhythm.     Heart sounds: Normal heart sounds.  Pulmonary:     Effort: Pulmonary effort is normal. No respiratory distress.     Breath sounds: No wheezing.  Abdominal:     Palpations: Abdomen is soft.     Tenderness: There is no abdominal tenderness.  Musculoskeletal:     Cervical back: Neck supple.     Right  lower leg: No edema.     Left lower leg: No edema.  Skin:    General: Skin is warm and dry.     Comments: Vesicular rash under left breast that does not cross midline, slightly erythematous base, no warmth associated  Neurological:     Mental Status: She is alert and oriented to person, place, and time.  Psychiatric:        Mood and Affect: Mood normal.     ED Results / Procedures / Treatments   Labs (all labs ordered are listed, but only abnormal results are displayed) Labs Reviewed - No data to display  EKG None  Radiology No results found.  Procedures Procedures (including critical care time)  Medications Ordered in ED Medications  oxyCODONE-acetaminophen (PERCOCET/ROXICET) 5-325 MG per tablet 1 tablet (has no administration in time range)  valACYclovir (VALTREX) tablet 1,000 mg (has no administration in time range)    ED Course  I have reviewed the triage vital signs and the nursing notes.  Pertinent labs & imaging results that were available during my care of the patient were reviewed by me and considered in my medical decision making (see chart for details).    MDM Rules/Calculators/A&P                           Patient presents with a rash under her left breast.  She is overall nontoxic and vital signs are notable for blood pressure of 175/82.  Rash is characteristic of shingles in the T5 distribution.  There is no warmth or erythema to indicate cellulitis.  Lower suspicion for  contact dermatitis.  Will start on valacyclovir.  Patient was given pain medication.  Creatinine check from April of this year and normal.  Will start on anti-inflammatories for pain control.  Primary care follow-up recommended.  After history, exam, and medical workup I feel the patient has been appropriately medically screened and is safe for discharge home. Pertinent diagnoses were discussed with the patient. Patient was given return precautions.   Final Clinical Impression(s) / ED  Diagnoses Final diagnoses:  Herpes zoster without complication    Rx / DC Orders ED Discharge Orders         Ordered    valACYclovir (VALTREX) 1000 MG tablet  3 times daily     Discontinue  Reprint     09/12/19 2353    naproxen (NAPROSYN) 500 MG tablet  2 times daily     Discontinue  Reprint     09/12/19 2353           Merryl Hacker, MD 09/12/19 2357

## 2019-09-13 DIAGNOSIS — B029 Zoster without complications: Secondary | ICD-10-CM | POA: Diagnosis not present

## 2019-09-14 ENCOUNTER — Telehealth: Payer: Self-pay | Admitting: Cardiology

## 2019-09-14 ENCOUNTER — Other Ambulatory Visit: Payer: Self-pay

## 2019-09-14 MED ORDER — TICAGRELOR 90 MG PO TABS: 90.0000 mg | ORAL_TABLET | Freq: Two times a day (BID) | ORAL | 0 refills | Status: DC

## 2019-09-14 MED ORDER — ATORVASTATIN CALCIUM 40 MG PO TABS: ORAL_TABLET | ORAL | 0 refills | Status: DC

## 2019-09-14 MED ORDER — CARVEDILOL 3.125 MG PO TABS: ORAL_TABLET | ORAL | 0 refills | Status: DC

## 2019-09-14 MED ORDER — LOSARTAN POTASSIUM 25 MG PO TABS: 25.0000 mg | ORAL_TABLET | Freq: Every day | ORAL | 0 refills | Status: DC

## 2019-09-14 NOTE — Telephone Encounter (Signed)
Patient's daughter, Vickii Chafe, is calling to see if the patient can still come to her appointment - the patient went to the ED last night 8/1 and was told she has shingles. Please advise.

## 2019-09-14 NOTE — Telephone Encounter (Signed)
Called patient to reschedule. Had to use 24 hour spot on August 30th.  Patient daughter verbalized understanding, needed refill for medications, sent in 2 month supply. Thanks!

## 2019-09-14 NOTE — Telephone Encounter (Signed)
Hey- wanted to check in and see what your thoughts were.  I was going to reschedule or do virtual. Thoughts?  Thanks!

## 2019-09-15 ENCOUNTER — Ambulatory Visit (INDEPENDENT_AMBULATORY_CARE_PROVIDER_SITE_OTHER): Payer: Medicare Other | Admitting: Family Medicine

## 2019-09-15 ENCOUNTER — Encounter: Payer: Self-pay | Admitting: Family Medicine

## 2019-09-15 DIAGNOSIS — B029 Zoster without complications: Secondary | ICD-10-CM

## 2019-09-15 DIAGNOSIS — I25119 Atherosclerotic heart disease of native coronary artery with unspecified angina pectoris: Secondary | ICD-10-CM

## 2019-09-15 MED ORDER — LIDOCAINE 5 % EX OINT: 1.0000 "application " | TOPICAL_OINTMENT | Freq: Every day | CUTANEOUS | 0 refills | Status: DC | PRN

## 2019-09-15 NOTE — Progress Notes (Signed)
Virtual Visit via Telephone Note   I connected with Courtney Mcconnell on 09/15/19 at 2:02 PM by telephone and verified that I am speaking with the correct person using two identifiers. Courtney Mcconnell is currently located at home and nobody is currently with her during this visit. The provider, Loman [place], FNP is located in their office at time of visit.  I discussed the limitations, risks, security and privacy concerns of performing an evaluation and management service by telephone and the availability of in person appointments. I also discussed with the patient that there may be a patient responsible charge related to this service. The patient expressed understanding and agreed to proceed.  Subjective: PCP: Courtney Mcconnell, Courtney Kaufmann, MD  Chief Complaint  Patient presents with  . Rash   Patient was diagnosed with shingles at the ER 3 days ago. She was prescribed Valtrex and naproxen. She is requesting lidocaine ointment to help calm down itching.   ROS: Per HPI  Current Outpatient Medications:  .  albuterol (VENTOLIN HFA) 108 (90 Base) MCG/ACT inhaler, INHALE TWO PUFFS INTO THE LUNGS EVERY 6 HOURS AS NEEDED FOR WHEEZING OR SHORTNESS OF BREATH (Patient taking differently: Inhale 2 puffs into the lungs every 6 (six) hours as needed for wheezing. ), Disp: 8.5 g, Rfl: 0 .  aspirin EC 81 MG EC tablet, Take 1 tablet (81 mg total) by mouth daily., Disp: 90 tablet, Rfl: 1 .  atorvastatin (LIPITOR) 40 MG tablet, TAKE 1 TABLET BY MOUTH DAILY AT 6PM, Disp: 60 tablet, Rfl: 0 .  carvedilol (COREG) 3.125 MG tablet, TAKE 1 TABLET BY MOUTH TWICE DAILY WITH A MEAL, Disp: 60 tablet, Rfl: 0 .  Fluticasone-Salmeterol (ADVAIR DISKUS) 100-50 MCG/DOSE AEPB, Inhale 1 puff into the lungs in the morning and at bedtime., Disp: 60 each, Rfl: 2 .  losartan (COZAAR) 25 MG tablet, Take 1 tablet (25 mg total) by mouth daily., Disp: 60 tablet, Rfl: 0 .  naproxen (NAPROSYN) 500 MG tablet, Take 1 tablet (500 mg total) by  mouth 2 (two) times daily., Disp: 30 tablet, Rfl: 0 .  nitroGLYCERIN (NITROSTAT) 0.4 MG SL tablet, Place 1 tablet (0.4 mg total) under the tongue every 5 (five) minutes as needed for chest pain., Disp: 30 tablet, Rfl: 5 .  omeprazole (PRILOSEC OTC) 20 MG tablet, Take 20 mg by mouth daily., Disp: , Rfl:  .  ticagrelor (BRILINTA) 90 MG TABS tablet, Take 1 tablet (90 mg total) by mouth 2 (two) times daily., Disp: 120 tablet, Rfl: 0 .  tiotropium (SPIRIVA HANDIHALER) 18 MCG inhalation capsule, Place 1 capsule (18 mcg total) into inhaler and inhale daily., Disp: 30 capsule, Rfl: 5 .  valACYclovir (VALTREX) 1000 MG tablet, Take 1 tablet (1,000 mg total) by mouth 3 (three) times daily for 7 days., Disp: 21 tablet, Rfl: 0 .  VIMPAT 100 MG TABS, Take 300 mg by mouth 2 (two) times daily., Disp: , Rfl:  .  zonisamide (ZONEGRAN) 100 MG capsule, Take 200 mg by mouth at bedtime. , Disp: , Rfl: 3  Allergies  Allergen Reactions  . Bee Venom Swelling  . Amoxicillin-Pot Clavulanate Rash  . Doxycycline Hyclate Rash    Rash and itching   Past Medical History:  Diagnosis Date  . Asthma   . Bursitis of left hip    right  . COPD (chronic obstructive pulmonary disease) ([place])   . GERD (gastroesophageal reflux disease)   . Headache(784.0)   . Hyperlipidemia   . Hypertension   .  NSTEMI (non-ST elevated myocardial infarction) ([place]) 02/11/2019   PCI/DES x1 to mLAD, focal ostial 70% lesion in 1st diag  . Pneumonia   . Seizure ([place])    most recent 12/12/14  . Sleep apnea     Observations/Objective: A&O  No respiratory distress or wheezing audible over the phone Mood, judgement, and thought processes all WNL   Assessment and Plan: 1. Herpes zoster without complication - lidocaine (XYLOCAINE) 5 % ointment; Apply 1 application topically daily as needed.  Dispense: 50 g; Refill: 0   Follow Up Instructions:  I discussed the assessment and treatment plan with the patient. The patient was provided an  opportunity to ask questions and all were answered. The patient agreed with the plan and demonstrated an understanding of the instructions.   The patient was advised to call back or seek an in-person evaluation if the symptoms worsen or if the condition fails to improve as anticipated.  The above assessment and management plan was discussed with the patient. The patient verbalized understanding of and has agreed to the management plan. Patient is aware to call the clinic if symptoms persist or worsen. Patient is aware when to return to the clinic for a follow-up visit. Patient educated on when it is appropriate to go to the emergency department.   Time call ended: 2:06 PM  I provided 6 minutes of non-face-to-face time during this encounter.  Hendricks Limes, MSN, APRN, FNP-C [facility] 09/15/19

## 2019-09-18 ENCOUNTER — Ambulatory Visit: Payer: Medicare Other | Admitting: Adult Health

## 2019-09-18 ENCOUNTER — Ambulatory Visit: Payer: Medicare Other | Admitting: General Practice

## 2019-09-20 ENCOUNTER — Other Ambulatory Visit: Payer: Self-pay | Admitting: Family Medicine

## 2019-09-20 DIAGNOSIS — J440 Chronic obstructive pulmonary disease with acute lower respiratory infection: Secondary | ICD-10-CM

## 2019-10-11 NOTE — Progress Notes (Signed)
Cardiology Clinic Note   Patient Name: Courtney Mcconnell Date of Encounter: 10/12/2019  Primary Care Provider:  Dettinger, Fransisca Kaufmann, MD Primary Cardiologist:  Glenetta Hew, MD  Patient Profile    Courtney Mcconnell 64 year old female presents to the clinic today for follow-up evaluation of her coronary artery disease.  Past Medical History    Past Medical History:  Diagnosis Date  . Asthma   . Bursitis of left hip    right  . COPD (chronic obstructive pulmonary disease) ([place])   . GERD (gastroesophageal reflux disease)   . Headache(784.0)   . Hyperlipidemia   . Hypertension   . NSTEMI (non-ST elevated myocardial infarction) ([place]) 02/11/2019   PCI/DES x1 to mLAD, focal ostial 70% lesion in 1st diag  . Pneumonia   . Seizure ([place])    most recent 12/12/14  . Sleep apnea    Past Surgical History:  Procedure Laterality Date  . ABDOMINAL HYSTERECTOMY    . APPENDECTOMY    . CATARACT EXTRACTION Bilateral   . CHOLECYSTECTOMY    . CORONARY STENT INTERVENTION N/A 02/11/2019   Procedure: CORONARY STENT INTERVENTION;  Surgeon: Burnell Blanks, MD;  Location: [place] [facility];; mid LAD 70% -- DES PCI (Synergy DES 2.5 x 28 --2.8 mm)  . heal spur     right  . INTRAVASCULAR PRESSURE WIRE/FFR STUDY N/A 02/11/2019   Procedure: INTRAVASCULAR PRESSURE WIRE/FFR STUDY;  Surgeon: Burnell Blanks, MD;  Location: [place] [facility];  Service: Cardiovascular;  Laterality: N/A;  . LASER PHOTO ABLATION Right 08/27/2013   Procedure: LASER PHOTO ABLATION;  Surgeon: Hayden Pedro, MD;  Location: [place];  Service: Ophthalmology;  Laterality: Right;  Headscope laser AND Endolaser  . LEFT HEART CATH AND CORONARY ANGIOGRAPHY N/A 02/11/2019   Procedure: LEFT HEART CATH AND CORONARY ANGIOGRAPHY;  Surgeon: Burnell Blanks, MD;  Location: [place] [facility];;; prox RCA 20%. Ost-prox Cx 20%. ostial D1 70% & mLAD 70% => (DES PCI of LAD)  . MEMBRANE PEEL Right 08/27/2013   Procedure:  MEMBRANE PEEL;  Surgeon: Hayden Pedro, MD;  Location: [place];  Service: Ophthalmology;  Laterality: Right;  . PARS PLANA VITRECTOMY Right 08/27/2013   Procedure: PARS PLANA VITRECTOMY WITH 25 GAUGE;  Surgeon: Hayden Pedro, MD;  Location: [place];  Service: Ophthalmology;  Laterality: Right;    Allergies  Allergies  Allergen Reactions  . Bee Venom Swelling  . Amoxicillin-Pot Clavulanate Rash  . Doxycycline Hyclate Rash    Rash and itching    History of Present Illness    She has a PMH of asthma, COPD, GERD, hyperlipidemia, essential hypertension, NSTEMI 02/12/2019 with PCI and DES x1 to mid LAD, focal ostial 70% lesion in first diagonal, seizure, memory issues, and sleep apnea. Her echocardiogram 02/11/2019 showed an EF of 55-60%, normal wall motion, G1 DD with normal LA and RA size. Mild ascending aorta dilation of 40 mm.  She was unable to complete cardiac rehab secondary to COVID-19 restrictions. She was last seen by Dr. Ellyn Hack on 05/18/2019. During that time she was doing well. She had no chest tightness or pressure. She is tolerating her medications well with no side effects.  She presents to the clinic today for follow-up evaluation and states she feels fairly well.  Her only complaint is left medial arm swelling around her elbow region.  She states she has had increased swelling in this area for a few weeks.  Recently it has been causing her to  have some burning type pain.  There is no  erythema, insect bite, or cellulitis.  I have asked him to follow-up with PCP.  She has been increasing her physical activity with daily walks around 30 minutes and walks with her children around 1 hour and 30 minutes on weekends.  She does not have any chest discomfort with physical activity.  She does have some mild arm bruising and her daughter asks if they can take their aspirin every other day.  Daughter would also like to know if they may follow-up in [place].  I will have her continue her current  medication regimen, diet, and physical activity.  We will follow up with them in 6 months.  Today she denies chest pain, shortness of breath, lower extremity edema, fatigue, palpitations, melena, hematuria, hemoptysis, diaphoresis, weakness, presyncope, syncope, orthopnea, and PND.   Home Medications    Prior to Admission medications   Medication Sig Start Date End Date Taking? Authorizing Provider  albuterol (VENTOLIN HFA) 108 (90 Base) MCG/ACT inhaler INHALE TWO PUFFS INTO THE LUNGS EVERY 6 HOURS AS NEEDED FOR WHEEZING OR SHORTNESS OF BREATH Patient taking differently: Inhale 2 puffs into the lungs every 6 (six) hours as needed for wheezing.  06/02/18   Dettinger, Fransisca Kaufmann, MD  aspirin EC 81 MG EC tablet Take 1 tablet (81 mg total) by mouth daily. 02/12/19   Cheryln Manly, NP  atorvastatin (LIPITOR) 40 MG tablet TAKE 1 TABLET BY MOUTH DAILY AT 6PM 09/14/19   Leonie Man, MD  carvedilol (COREG) 3.125 MG tablet TAKE 1 TABLET BY MOUTH TWICE DAILY WITH A MEAL 09/14/19   Leonie Man, MD  Fluticasone-Salmeterol (ADVAIR DISKUS) 100-50 MCG/DOSE AEPB Inhale 1 puff into the lungs in the morning and at bedtime. 05/27/19   Dettinger, Fransisca Kaufmann, MD  lidocaine (XYLOCAINE) 5 % ointment Apply 1 application topically daily as needed. 09/15/19   Loman Brooklyn, FNP  losartan (COZAAR) 25 MG tablet Take 1 tablet (25 mg total) by mouth daily. 09/14/19 12/13/19  Leonie Man, MD  naproxen (NAPROSYN) 500 MG tablet Take 1 tablet (500 mg total) by mouth 2 (two) times daily. 09/12/19   Horton, Barbette Hair, MD  nitroGLYCERIN (NITROSTAT) 0.4 MG SL tablet Place 1 tablet (0.4 mg total) under the tongue every 5 (five) minutes as needed for chest pain. 07/30/18   Dettinger, Fransisca Kaufmann, MD  omeprazole (PRILOSEC OTC) 20 MG tablet Take 20 mg by mouth daily.    [provider]  SPIRIVA HANDIHALER 18 MCG inhalation capsule place ONE CAPSULE into inhaler AND INHALE DAILY 09/21/19   Dettinger, Fransisca Kaufmann, MD  ticagrelor  (BRILINTA) 90 MG TABS tablet Take 1 tablet (90 mg total) by mouth 2 (two) times daily. 09/14/19   Leonie Man, MD  VIMPAT 100 MG TABS Take 300 mg by mouth 2 (two) times daily. 03/03/18   [provider]  zonisamide (ZONEGRAN) 100 MG capsule Take 200 mg by mouth at bedtime.  06/13/17   [provider]    Family History    Family History  Problem Relation Age of Onset  . Diabetes Father   . Hypertension Father   . Hyperlipidemia Father   . COPD Mother   . Diabetes Brother   . Stroke Paternal Grandmother   . Cancer Sister   . Cancer Paternal Uncle   . Colon cancer Neg Hx    She indicated that her mother is alive. She indicated that her father is deceased. She  indicated that only one of her two sisters is alive. She indicated that two of her three brothers are alive. She indicated that her maternal grandmother is deceased. She indicated that her maternal grandfather is deceased. She indicated that her paternal grandmother is deceased. She indicated that her paternal grandfather is deceased. She indicated that her daughter is alive. She indicated that her son is alive. She indicated that the status of her paternal uncle is unknown. She indicated that the status of her neg hx is unknown.  Social History    Social History   Socioeconomic History  . Marital status: Married    Spouse name: [name]  . Number of children: 2  . Years of education: GED  . Highest education level: Not on file  Occupational History  . Occupation: Airline pilot: [employer]  Tobacco Use  . Smoking status: Former Smoker    Packs/day: 0.25    Years: 42.00    Pack years: 10.50    Types: Cigarettes    Quit date: 03/20/2014    Years since quitting: 5.5  . Smokeless tobacco: Never Used  . Tobacco comment: 01/21/15 restarted smoking 4 mos ago  Vaping Use  . Vaping Use: Never used  Substance and Sexual Activity  . Alcohol use: No    Alcohol/week: 0.0 standard drinks  . Drug use:  No  . Sexual activity: Not Currently  Other Topics Concern  . Not on file  Social History Narrative   Patient lives at home with family.   Caffeine Use: 1 pot and a half a day   Social Determinants of Health   Financial Resource Strain:   . Difficulty of Paying Living Expenses: Not on file  Food Insecurity:   . Worried About Charity fundraiser in the Last Year: Not on file  . Ran Out of Food in the Last Year: Not on file  Transportation Needs:   . Lack of Transportation (Medical): Not on file  . Lack of Transportation (Non-Medical): Not on file  Physical Activity:   . Days of Exercise per Week: Not on file  . Minutes of Exercise per Session: Not on file  Stress:   . Feeling of Stress : Not on file  Social Connections:   . Frequency of Communication with Friends and Family: Not on file  . Frequency of Social Gatherings with Friends and Family: Not on file  . Attends Religious Services: Not on file  . Active Member of Clubs or Organizations: Not on file  . Attends Archivist Meetings: Not on file  . Marital Status: Not on file  Intimate Partner Violence:   . Fear of Current or Ex-Partner: Not on file  . Emotionally Abused: Not on file  . Physically Abused: Not on file  . Sexually Abused: Not on file     Review of Systems    General:  No chills, fever, night sweats or weight changes.  Cardiovascular:  No chest pain, dyspnea on exertion, edema, orthopnea, palpitations, paroxysmal nocturnal dyspnea. Dermatological: No rash, lesions/masses Respiratory: No cough, dyspnea Urologic: No hematuria, dysuria Abdominal:   No nausea, vomiting, diarrhea, bright red blood per rectum, melena, or hematemesis Neurologic:  No visual changes, wkns, changes in mental status. All other systems reviewed and are otherwise negative except as noted above.  Physical Exam    VS:  BP 124/86 (BP Location: Left Arm, Patient Position: Sitting, Cuff Size: Large)   Pulse (!) 58   Ht 5'  6"  (1.676 m)   Wt 238 lb (108 kg)   BMI 38.41 kg/m  , BMI Body mass index is 38.41 kg/m. GEN: Well nourished, well developed, in no acute distress. HEENT: normal. Neck: Supple, no JVD, carotid bruits, or masses. Cardiac: RRR, no murmurs, rubs, or gallops. No clubbing, cyanosis, edema.  Radials/DP/PT 2+ and equal bilaterally.  Respiratory:  Respirations regular and unlabored, clear to auscultation bilaterally. GI: Soft, nontender, nondistended, BS + x 4. MS: no deformity or atrophy.  Axillary tenderness, left medial elbow edema Skin: warm and dry, no rash. Neuro:  Strength and sensation are intact. Psych: Normal affect.  Accessory Clinical Findings    Recent Labs: 02/11/2019: B Natriuretic Peptide 77.4; TSH 3.299 02/20/2019: Hemoglobin 13.5; Platelets 280 05/26/2019: ALT 5; BUN 11; Creatinine, Ser 0.87; Potassium 4.2; Sodium 143   Recent Lipid Panel    Component Value Date/Time   CHOL 127 05/26/2019 0912   TRIG 101 05/26/2019 0912   HDL 48 05/26/2019 0912   CHOLHDL 2.6 05/26/2019 0912   CHOLHDL 3.9 02/11/2019 0211   VLDL 12 02/11/2019 0211   LDLCALC 60 05/26/2019 0912   LDLDIRECT 151 (H) 07/06/2015 1604    ECG personally reviewed by me today-none today.  EKG 03/02/2019 Normal sinus rhythm incomplete right bundle branch block nonspecific T wave abnormality 64 bpm  Echocardiogram 02/11/2019 IMPRESSIONS    1. Left ventricular ejection fraction, by visual estimation, is 55 to  60%. The left ventricle has normal function. There is no left ventricular  hypertrophy.  2. Left ventricular diastolic parameters are consistent with Grade I  diastolic dysfunction (impaired relaxation).  3. The left ventricle has no regional wall motion abnormalities.  4. Global right ventricle has normal systolic function.The right  ventricular size is normal. No increase in right ventricular wall  thickness.  5. Left atrial size was normal.  6. Right atrial size was normal.  7. Presence of  pericardial fat pad.  8. Trivial pericardial effusion is present.  9. The mitral valve is grossly normal. Trivial mitral valve  regurgitation.  10. The tricuspid valve is grossly normal.  11. The aortic valve is grossly normal. Aortic valve regurgitation is not  visualized. No evidence of aortic valve sclerosis or stenosis.  12. The pulmonic valve was grossly normal. Pulmonic valve regurgitation is  not visualized.  13. There is mild dilatation of the ascending aorta measuring 40 mm.  14. TR signal is inadequate for assessing pulmonary artery systolic  pressure.  15. The inferior vena cava is normal in size with greater than 50%  respiratory variability, suggesting right atrial pressure of 3 mmHg.  16. No prior Echocardiogram.  17. The average left ventricular global longitudinal strain is -16.3 %.   Assessment & Plan   1. Coronary artery disease-status post cardiac catheterization with PCI and DES x1 to mid LAD, focal ostial 70% lesion in first diagonal. Her echocardiogram 02/11/2019 showed an EF of 55-60%, normal wall motion, G1 DD with normal LA and RA size. Mild ascending aorta dilation of 40 mm. Continue aspirin, atorvastatin, carvedilol, nitroglycerin, Brilinta Heart healthy low-sodium diet-salty 6 given Increase physical activity as tolerated  Essential hypertension-BP today 124/86.  Well-controlled at home Continue continue losartan, carvedilol Heart healthy low-sodium diet-salty 6 given Increase physical activity as tolerated   Hyperlipidemia-02/11/2019: VLDL 12 05/26/2019: Cholesterol, Total 127; HDL 48; LDL Chol Calc (NIH) 60; Triglycerides 101 Goal less than 70 Continue atorvastatin Heart healthy low-sodium high-fiber diet increase physical activity as tolerated  Left arm pain-has  noticed some swelling on the medial aspect of her elbow over the last several weeks.  Does not appear to be cellulitis or related to insect bite.  No erythema. Follow-up with  PCP   Disposition: Follow-up with Dr. Ellyn Hack or myself in 6 months.    Jossie Ng. Adryel Wortmann NP-C    10/12/2019, 4:23 PM [facility] [address] Office [phone] Fax [phone]  Notice: This dictation was prepared with Dragon dictation along with smaller phrase technology. Any transcriptional errors that result from this process are unintentional and may not be corrected upon review.

## 2019-10-12 ENCOUNTER — Ambulatory Visit (INDEPENDENT_AMBULATORY_CARE_PROVIDER_SITE_OTHER): Payer: Medicare Other | Admitting: General Practice

## 2019-10-12 ENCOUNTER — Other Ambulatory Visit: Payer: Self-pay

## 2019-10-12 ENCOUNTER — Encounter: Payer: Self-pay | Admitting: General Practice

## 2019-10-12 VITALS — BP 124/86 | HR 58 | Ht 66.0 in | Wt 238.0 lb

## 2019-10-12 DIAGNOSIS — M79602 Pain in left arm: Secondary | ICD-10-CM | POA: Diagnosis not present

## 2019-10-12 DIAGNOSIS — E785 Hyperlipidemia, unspecified: Secondary | ICD-10-CM

## 2019-10-12 DIAGNOSIS — I10 Essential (primary) hypertension: Secondary | ICD-10-CM

## 2019-10-12 DIAGNOSIS — I25119 Atherosclerotic heart disease of native coronary artery with unspecified angina pectoris: Secondary | ICD-10-CM | POA: Diagnosis not present

## 2019-10-12 NOTE — Patient Instructions (Signed)
Medication Instructions:  Continue current medications  *If you need a refill on your cardiac medications before your next appointment, please call your pharmacy*   Lab Work: None Ordered   Testing/Procedures: None Ordered   Follow-Up: At [facility], you and your health needs are our priority.  As part of our continuing mission to provide you with exceptional heart care, we have created designated Provider Care Teams.  These Care Teams include your primary Cardiologist (physician) and Advanced Practice Providers (APPs -  Physician Assistants and Nurse Practitioners) who all work together to provide you with the care you need, when you need it.  We recommend signing up for the patient portal called "MyChart".  Sign up information is provided on this After Visit Summary.  MyChart is used to connect with patients for Virtual Visits (Telemedicine).  Patients are able to view lab/test results, encounter notes, upcoming appointments, etc.  Non-urgent messages can be sent to your provider as well.   To learn more about what you can do with MyChart, go to [email or web address].    Your next appointment:   6 month(s)  The format for your next appointment:   In Person  Provider:   In Ord

## 2019-10-23 ENCOUNTER — Encounter: Payer: Self-pay | Admitting: Nurse Practitioner

## 2019-10-23 ENCOUNTER — Other Ambulatory Visit: Payer: Self-pay

## 2019-10-23 ENCOUNTER — Ambulatory Visit (INDEPENDENT_AMBULATORY_CARE_PROVIDER_SITE_OTHER): Payer: Medicare Other | Admitting: Nurse Practitioner

## 2019-10-23 ENCOUNTER — Ambulatory Visit (INDEPENDENT_AMBULATORY_CARE_PROVIDER_SITE_OTHER): Payer: Medicare Other

## 2019-10-23 VITALS — BP 134/70 | HR 56 | Temp 97.7°F | Resp 20 | Ht 66.0 in | Wt 238.0 lb

## 2019-10-23 DIAGNOSIS — M19012 Primary osteoarthritis, left shoulder: Secondary | ICD-10-CM | POA: Diagnosis not present

## 2019-10-23 DIAGNOSIS — M25612 Stiffness of left shoulder, not elsewhere classified: Secondary | ICD-10-CM

## 2019-10-23 DIAGNOSIS — M25512 Pain in left shoulder: Secondary | ICD-10-CM

## 2019-10-23 MED ORDER — KETOROLAC TROMETHAMINE 60 MG/2ML IM SOLN
60.0000 mg | Freq: Once | INTRAMUSCULAR | Status: AC
  Administered 2019-10-23: 60 mg via INTRAMUSCULAR

## 2019-10-23 MED ORDER — IBUPROFEN 600 MG PO TABS: 600.0000 mg | ORAL_TABLET | Freq: Three times a day (TID) | ORAL | 0 refills | Status: DC | PRN

## 2019-10-23 NOTE — Assessment & Plan Note (Signed)
Patient is a 64 year old female who presents to clinic with left shoulder pain. This is a new problem in the last 7 days. Patient was trying to reach and over extended her arm. Patient has limited range of motion with excruciating pain. She described pain as a 9 out of 10 on the pain scale of 0-10. Quality of pain is aching and severe. Pain is worsened with movement. Patient denies fever and chills. Completed x-ray which shows no acute fracture or dislocation. Provided education to patient with printed handouts given. Advised resting joint, ice application to joints, ibuprofen 600 mg every 12 hours. Toradol given in clinic.  Follow-up with worsening or unresolved symptoms.

## 2019-10-23 NOTE — Progress Notes (Signed)
Acute Office Visit  Subjective:    Patient ID: Courtney Mcconnell, female    DOB: 1955/07/22, 64 y.o.   MRN: [id number]  Chief Complaint  Patient presents with  . left shoulder pain    decreased ROM of left shoulder     Shoulder Pain  The pain is present in the left shoulder. This is a new problem. The current episode started in the past 7 days (Patient was trying to reach and extended beyond comfort). There has been no history of extremity trauma. The problem occurs constantly. The problem has been unchanged. The quality of the pain is described as aching. The pain is at a severity of 9/10. The pain is severe. Associated symptoms include a limited range of motion and stiffness. Pertinent negatives include no fever. Exacerbated by: Movement. She has tried nothing for the symptoms.      Past Medical History:  Diagnosis Date  . Asthma   . Bursitis of left hip    right  . COPD (chronic obstructive pulmonary disease) ([place])   . GERD (gastroesophageal reflux disease)   . Headache(784.0)   . Hyperlipidemia   . Hypertension   . NSTEMI (non-ST elevated myocardial infarction) ([place]) 02/11/2019   PCI/DES x1 to mLAD, focal ostial 70% lesion in 1st diag  . Pneumonia   . Seizure ([place])    most recent 12/12/14  . Sleep apnea     Past Surgical History:  Procedure Laterality Date  . ABDOMINAL HYSTERECTOMY    . APPENDECTOMY    . CATARACT EXTRACTION Bilateral   . CHOLECYSTECTOMY    . CORONARY STENT INTERVENTION N/A 02/11/2019   Procedure: CORONARY STENT INTERVENTION;  Surgeon: Burnell Blanks, MD;  Location: [facility];; mid LAD 70% -- DES PCI (Synergy DES 2.5 x 28 --2.8 mm)  . heal spur     right  . INTRAVASCULAR PRESSURE WIRE/FFR STUDY N/A 02/11/2019   Procedure: INTRAVASCULAR PRESSURE WIRE/FFR STUDY;  Surgeon: Burnell Blanks, MD;  Location: [facility];  Service: Cardiovascular;  Laterality: N/A;  . LASER PHOTO ABLATION Right 08/27/2013   Procedure: LASER  PHOTO ABLATION;  Surgeon: Hayden Pedro, MD;  Location: [facility];  Service: Ophthalmology;  Laterality: Right;  Headscope laser AND Endolaser  . LEFT HEART CATH AND CORONARY ANGIOGRAPHY N/A 02/11/2019   Procedure: LEFT HEART CATH AND CORONARY ANGIOGRAPHY;  Surgeon: Burnell Blanks, MD;  Location: [facility];;; prox RCA 20%. Ost-prox Cx 20%. ostial D1 70% & mLAD 70% => (DES PCI of LAD)  . MEMBRANE PEEL Right 08/27/2013   Procedure: MEMBRANE PEEL;  Surgeon: Hayden Pedro, MD;  Location: [facility];  Service: Ophthalmology;  Laterality: Right;  . PARS PLANA VITRECTOMY Right 08/27/2013   Procedure: PARS PLANA VITRECTOMY WITH 25 GAUGE;  Surgeon: Hayden Pedro, MD;  Location: [facility];  Service: Ophthalmology;  Laterality: Right;    Family History  Problem Relation Age of Onset  . Diabetes Father   . Hypertension Father   . Hyperlipidemia Father   . COPD Mother   . Diabetes Brother   . Stroke Paternal Grandmother   . Cancer Sister   . Cancer Paternal Uncle   . Colon cancer Neg Hx     Social History   Socioeconomic History  . Marital status: Married    Spouse name: Courtney Mcconnell  . Number of children: 2  . Years of education: GED  . Highest education level: Not on file  Occupational History  .  Occupation: Airline pilot: [employer]  Tobacco Use  . Smoking status: Former Smoker    Packs/day: 0.25    Years: 42.00    Pack years: 10.50    Types: Cigarettes    Quit date: 03/20/2014    Years since quitting: 5.5  . Smokeless tobacco: Never Used  . Tobacco comment: 01/21/15 restarted smoking 4 mos ago  Vaping Use  . Vaping Use: Never used  Substance and Sexual Activity  . Alcohol use: No    Alcohol/week: 0.0 standard drinks  . Drug use: No  . Sexual activity: Not Currently  Other Topics Concern  . Not on file  Social History Narrative   Patient lives at home with family.   Caffeine Use: 1 pot and a half a day   Social Determinants of Health   Financial  Resource Strain:   . Difficulty of Paying Living Expenses: Not on file  Food Insecurity:   . Worried About Charity fundraiser in the Last Year: Not on file  . Ran Out of Food in the Last Year: Not on file  Transportation Needs:   . Lack of Transportation (Medical): Not on file  . Lack of Transportation (Non-Medical): Not on file  Physical Activity:   . Days of Exercise per Week: Not on file  . Minutes of Exercise per Session: Not on file  Stress:   . Feeling of Stress : Not on file  Social Connections:   . Frequency of Communication with Friends and Family: Not on file  . Frequency of Social Gatherings with Friends and Family: Not on file  . Attends Religious Services: Not on file  . Active Member of Clubs or Organizations: Not on file  . Attends Archivist Meetings: Not on file  . Marital Status: Not on file  Intimate Partner Violence:   . Fear of Current or Ex-Partner: Not on file  . Emotionally Abused: Not on file  . Physically Abused: Not on file  . Sexually Abused: Not on file    Outpatient Medications Prior to Visit  Medication Sig Dispense Refill  . albuterol (VENTOLIN HFA) 108 (90 Base) MCG/ACT inhaler INHALE TWO PUFFS INTO THE LUNGS EVERY 6 HOURS AS NEEDED FOR WHEEZING OR SHORTNESS OF BREATH (Patient taking differently: Inhale 2 puffs into the lungs every 6 (six) hours as needed for wheezing. ) 8.5 g 0  . aspirin EC 81 MG EC tablet Take 1 tablet (81 mg total) by mouth daily. 90 tablet 1  . atorvastatin (LIPITOR) 40 MG tablet TAKE 1 TABLET BY MOUTH DAILY AT 6PM 60 tablet 0  . carvedilol (COREG) 3.125 MG tablet TAKE 1 TABLET BY MOUTH TWICE DAILY WITH A MEAL 60 tablet 0  . Fluticasone-Salmeterol (ADVAIR DISKUS) 100-50 MCG/DOSE AEPB Inhale 1 puff into the lungs in the morning and at bedtime. 60 each 2  . lidocaine (XYLOCAINE) 5 % ointment Apply 1 application topically daily as needed. 50 g 0  . losartan (COZAAR) 25 MG tablet Take 1 tablet (25 mg total) by mouth  daily. 60 tablet 0  . nitroGLYCERIN (NITROSTAT) 0.4 MG SL tablet Place 1 tablet (0.4 mg total) under the tongue every 5 (five) minutes as needed for chest pain. 30 tablet 5  . omeprazole (PRILOSEC OTC) 20 MG tablet Take 20 mg by mouth daily.    Marland Kitchen SPIRIVA HANDIHALER 18 MCG inhalation capsule place ONE CAPSULE into inhaler AND INHALE DAILY 30 capsule 1  . ticagrelor (BRILINTA) 90 MG  TABS tablet Take 1 tablet (90 mg total) by mouth 2 (two) times daily. 120 tablet 0  . VIMPAT 100 MG TABS Take 150 mg by mouth 2 (two) times daily. Takes 600mg  two pills in the morning and two in the afternoon    . zonisamide (ZONEGRAN) 100 MG capsule Take 200 mg by mouth at bedtime.   3   No facility-administered medications prior to visit.    Allergies  Allergen Reactions  . Bee Venom Swelling  . Amoxicillin-Pot Clavulanate Rash  . Doxycycline Hyclate Rash    Rash and itching    Review of Systems  Constitutional: Negative for fever.  Musculoskeletal: Positive for arthralgias, joint swelling and stiffness.  All other systems reviewed and are negative.      Objective:    Physical Exam Constitutional:      Appearance: Normal appearance.  HENT:     Head: Normocephalic.  Eyes:     Conjunctiva/sclera: Conjunctivae normal.  Cardiovascular:     Rate and Rhythm: Normal rate and regular rhythm.  Pulmonary:     Effort: Pulmonary effort is normal.     Breath sounds: Normal breath sounds.  Abdominal:     General: Bowel sounds are normal.  Musculoskeletal:        General: Tenderness present.     Cervical back: Neck supple.  Skin:    General: Skin is warm.  Neurological:     Mental Status: She is alert and oriented to person, place, and time.     Resp 20   Ht 5\' 6"  (1.676 m)   BMI 38.41 kg/m  Wt Readings from Last 3 Encounters:  10/12/19 238 lb (108 kg)  09/12/19 (!) 220 lb (99.8 kg)  05/18/19 226 lb 6.4 oz (102.7 kg)    Health Maintenance Due  Topic Date Due  . COVID-19 Vaccine (1) Never  done  . INFLUENZA VACCINE  Never done    There are no preventive care reminders to display for this patient.   Lab Results  Component Value Date   TSH 3.299 02/11/2019   Lab Results  Component Value Date   WBC 8.5 02/20/2019   HGB 13.5 02/20/2019   HCT 40.8 02/20/2019   MCV 97 02/20/2019   PLT 280 02/20/2019   Lab Results  Component Value Date   NA 143 05/26/2019   K 4.2 05/26/2019   CO2 22 05/26/2019   GLUCOSE 107 (H) 05/26/2019   BUN 11 05/26/2019   CREATININE 0.87 05/26/2019   BILITOT 0.4 05/26/2019   ALKPHOS 122 (H) 05/26/2019   AST 9 05/26/2019   ALT 5 05/26/2019   PROT 6.8 05/26/2019   ALBUMIN 4.1 05/26/2019   CALCIUM 9.4 05/26/2019   ANIONGAP 8 02/12/2019   Lab Results  Component Value Date   CHOL 127 05/26/2019   Lab Results  Component Value Date   HDL 48 05/26/2019   Lab Results  Component Value Date   LDLCALC 60 05/26/2019   Lab Results  Component Value Date   TRIG 101 05/26/2019   Lab Results  Component Value Date   CHOLHDL 2.6 05/26/2019   Lab Results  Component Value Date   HGBA1C 5.9 (H) 02/20/2019       Assessment & Plan:   Problem List Items Addressed This Visit      Other   Left shoulder pain - Primary    Patient is a 64 year old female who presents to clinic with left shoulder pain. This is a new problem in the last  7 days. Patient was trying to reach and over extended her arm. Patient has limited range of motion with excruciating pain. She described pain as a 9 out of 10 on the pain scale of 0-10. Quality of pain is aching and severe. Pain is worsened with movement. Patient denies fever and chills. Completed x-ray which shows no acute fracture or dislocation. Provided education to patient with printed handouts given. Advised resting joint, ice application to joints, ibuprofen 600 mg every 12 hours. Toradol given in clinic.  Follow-up with worsening or unresolved symptoms.      Relevant Medications   ibuprofen (ADVIL) 600  MG tablet   Other Relevant Orders   DG Shoulder Left (Completed)   Decreased ROM of left shoulder   Relevant Orders   DG Shoulder Left (Completed)       Meds ordered this encounter  Medications  . ibuprofen (ADVIL) 600 MG tablet    Sig: Take 1 tablet (600 mg total) by mouth every 8 (eight) hours as needed.    Dispense:  30 tablet    Refill:  0    Order Specific Question:   Supervising Provider    Answer:   Caryl Pina A [id number]  . ketorolac (TORADOL) injection 60 mg     Ivy Lynn, NP

## 2019-10-23 NOTE — Patient Instructions (Signed)
Shoulder Pain Many things can cause shoulder pain, including:  An injury.  Moving the shoulder in the same way again and again (overuse).  Joint pain (arthritis). Pain can come from:  Swelling and irritation (inflammation) of any part of the shoulder.  An injury to the shoulder joint.  An injury to: ? Tissues that connect muscle to bone (tendons). ? Tissues that connect bones to each other (ligaments). ? Bones. Follow these instructions at home: Watch for changes in your symptoms. Let your doctor know about them. Follow these instructions to help with your pain. If you have a sling:  Wear the sling as told by your doctor. Remove it only as told by your doctor.  Loosen the sling if your fingers: ? Tingle. ? Become numb. ? Turn cold and blue.  Keep the sling clean.  If the sling is not waterproof: ? Do not let it get wet. ? Take the sling off when you shower or bathe. Managing pain, stiffness, and swelling   If told, put ice on the painful area: ? Put ice in a plastic bag. ? Place a towel between your skin and the bag. ? Leave the ice on for 20 minutes, 2-3 times a day. Stop putting ice on if it does not help with the pain.  Squeeze a soft ball or a foam pad as much as possible. This prevents swelling in the shoulder. It also helps to strengthen the arm. General instructions  Take over-the-counter and prescription medicines only as told by your doctor.  Keep all follow-up visits as told by your doctor. This is important. Contact a doctor if:  Your pain gets worse.  Medicine does not help your pain.  You have new pain in your arm, hand, or fingers. Get help right away if:  Your arm, hand, or fingers: ? Tingle. ? Are numb. ? Are swollen. ? Are painful. ? Turn white or blue. Summary  Shoulder pain can be caused by many things. These include injury, moving the shoulder in the same away again and again, and joint pain.  Watch for changes in your symptoms.  Let your doctor know about them.  This condition may be treated with a sling, ice, and pain medicine.  Contact your doctor if the pain gets worse or you have new pain. Get help right away if your arm, hand, or fingers tingle or get numb, swollen, or painful.  Keep all follow-up visits as told by your doctor. This is important. This information is not intended to replace advice given to you by your health care provider. Make sure you discuss any questions you have with your health care provider. Document Revised: 08/13/2017 Document Reviewed: 08/13/2017 Elsevier Patient Education  2020 Elsevier Inc[employer]  

## 2019-11-19 ENCOUNTER — Other Ambulatory Visit: Payer: Self-pay | Admitting: Family Medicine

## 2019-11-19 ENCOUNTER — Other Ambulatory Visit: Payer: Self-pay | Admitting: Cardiology

## 2019-11-19 DIAGNOSIS — J440 Chronic obstructive pulmonary disease with acute lower respiratory infection: Secondary | ICD-10-CM

## 2019-12-10 DIAGNOSIS — G40209 Localization-related (focal) (partial) symptomatic epilepsy and epileptic syndromes with complex partial seizures, not intractable, without status epilepticus: Secondary | ICD-10-CM | POA: Diagnosis not present

## 2019-12-10 DIAGNOSIS — R202 Paresthesia of skin: Secondary | ICD-10-CM | POA: Diagnosis not present

## 2019-12-10 DIAGNOSIS — G4730 Sleep apnea, unspecified: Secondary | ICD-10-CM | POA: Diagnosis not present

## 2019-12-10 DIAGNOSIS — Z79899 Other long term (current) drug therapy: Secondary | ICD-10-CM | POA: Diagnosis not present

## 2019-12-21 ENCOUNTER — Other Ambulatory Visit: Payer: Self-pay | Admitting: Cardiology

## 2020-01-13 ENCOUNTER — Encounter: Payer: Self-pay | Admitting: Family Medicine

## 2020-01-13 ENCOUNTER — Ambulatory Visit (INDEPENDENT_AMBULATORY_CARE_PROVIDER_SITE_OTHER): Payer: Medicare Other | Admitting: Family Medicine

## 2020-01-13 DIAGNOSIS — J440 Chronic obstructive pulmonary disease with acute lower respiratory infection: Secondary | ICD-10-CM

## 2020-01-13 DIAGNOSIS — J209 Acute bronchitis, unspecified: Secondary | ICD-10-CM

## 2020-01-13 MED ORDER — CEFDINIR 300 MG PO CAPS: 300.0000 mg | ORAL_CAPSULE | Freq: Two times a day (BID) | ORAL | 0 refills | Status: DC

## 2020-01-13 MED ORDER — PREDNISONE 20 MG PO TABS: ORAL_TABLET | ORAL | 0 refills | Status: DC

## 2020-01-13 NOTE — Progress Notes (Signed)
Virtual Visit via telephone Note  I connected with Courtney Mcconnell on 01/13/20 at 1802 by telephone and verified that I am speaking with the correct person using two identifiers. Courtney Mcconnell is currently located at home and patient are currently with her during visit. The provider, Fransisca Kaufmann Mj Willis, MD is located in their office at time of visit.  Call ended at 1808  I discussed the limitations, risks, security and privacy concerns of performing an evaluation and management service by telephone and the availability of in person appointments. I also discussed with the patient that there may be a patient responsible charge related to this service. The patient expressed understanding and agreed to proceed.   History and Present Illness: Patient has been having 1 week of coughing and sneezing and congestion and she was tested for covid and it was negative. She is having wheezing.  She feels feverish and achy and fatigue.  She was not tested for flu. She is feeling weak and tired. She is using albuterol and it is helping. She is using Copywriter, advertising.  It is worsening.   No diagnosis found.  Outpatient Encounter Medications as of 01/13/2020  Medication Sig  . albuterol (VENTOLIN HFA) 108 (90 Base) MCG/ACT inhaler INHALE TWO PUFFS INTO THE LUNGS EVERY 6 HOURS AS NEEDED FOR WHEEZING OR SHORTNESS OF BREATH (Patient taking differently: Inhale 2 puffs into the lungs every 6 (six) hours as needed for wheezing. )  . aspirin EC 81 MG EC tablet Take 1 tablet (81 mg total) by mouth daily.  Marland Kitchen atorvastatin (LIPITOR) 40 MG tablet TAKE 1 TABLET BY MOUTH DAILY AT 6PM  . BRILINTA 90 MG TABS tablet TAKE 1 TABLET BY MOUTH TWICE DAILY  . carvedilol (COREG) 3.125 MG tablet TAKE 1 TABLET BY MOUTH TWICE DAILY WITH A MEAL  . Fluticasone-Salmeterol (ADVAIR DISKUS) 100-50 MCG/DOSE AEPB Inhale 1 puff into the lungs in the morning and at bedtime.  Marland Kitchen ibuprofen (ADVIL) 600 MG tablet Take 1 tablet (600 mg total) by mouth  every 8 (eight) hours as needed.  . lidocaine (XYLOCAINE) 5 % ointment Apply 1 application topically daily as needed.  Marland Kitchen losartan (COZAAR) 25 MG tablet Take 1 tablet (25 mg total) by mouth daily.  . nitroGLYCERIN (NITROSTAT) 0.4 MG SL tablet Place 1 tablet (0.4 mg total) under the tongue every 5 (five) minutes as needed for chest pain. (Patient not taking: Reported on 10/23/2019)  . omeprazole (PRILOSEC OTC) 20 MG tablet Take 20 mg by mouth daily.  Marland Kitchen SPIRIVA HANDIHALER 18 MCG inhalation capsule place ONE CAPSULE into inhaler AND INHALE DAILY  . VIMPAT 100 MG TABS Take 150 mg by mouth 2 (two) times daily. Takes 600mg  two pills in the morning and two in the afternoon  . zonisamide (ZONEGRAN) 100 MG capsule Take 200 mg by mouth at bedtime.    No facility-administered encounter medications on file as of 01/13/2020.    Review of Systems  Constitutional: Positive for chills and fever.  HENT: Positive for congestion, postnasal drip, rhinorrhea, sinus pressure, sneezing and sore throat. Negative for ear discharge and ear pain.   Eyes: Negative for pain, redness and visual disturbance.  Respiratory: Positive for cough, shortness of breath and wheezing. Negative for chest tightness.   Cardiovascular: Negative for chest pain and leg swelling.  Genitourinary: Negative for difficulty urinating and dysuria.  Musculoskeletal: Negative for back pain and gait problem.  Skin: Negative for rash.  Neurological: Negative for light-headedness and headaches.  Psychiatric/Behavioral: Negative  for agitation and behavioral problems.  All other systems reviewed and are negative.   Observations/Objective: Patient sounds comfortable and in no acute distress  Assessment and Plan: Problem List Items Addressed This Visit    None    Visit Diagnoses    Acute bronchitis with COPD ([place])    -  Primary   Relevant Medications   cefdinir (OMNICEF) 300 MG capsule   predniSONE (DELTASONE) 20 MG tablet       Follow up  plan: No follow-ups on file.     I discussed the assessment and treatment plan with the patient. The patient was provided an opportunity to ask questions and all were answered. The patient agreed with the plan and demonstrated an understanding of the instructions.   The patient was advised to call back or seek an in-person evaluation if the symptoms worsen or if the condition fails to improve as anticipated.  The above assessment and management plan was discussed with the patient. The patient verbalized understanding of and has agreed to the management plan. Patient is aware to call the clinic if symptoms persist or worsen. Patient is aware when to return to the clinic for a follow-up visit. Patient educated on when it is appropriate to go to the emergency department.    I provided 6 minutes of non-face-to-face time during this encounter.    Worthy Rancher, MD

## 2020-01-17 ENCOUNTER — Emergency Department (HOSPITAL_COMMUNITY): Payer: Medicare Other

## 2020-01-17 ENCOUNTER — Other Ambulatory Visit: Payer: Self-pay

## 2020-01-17 ENCOUNTER — Inpatient Hospital Stay (HOSPITAL_COMMUNITY)
Admission: EM | Admit: 2020-01-17 | Discharge: 2020-01-19 | DRG: 177 | Disposition: A | Discharge status: Home Health | Payer: Medicare Other | Attending: Internal Medicine | Admitting: Internal Medicine

## 2020-01-17 ENCOUNTER — Encounter (HOSPITAL_COMMUNITY): Payer: Self-pay | Admitting: Emergency Medicine

## 2020-01-17 DIAGNOSIS — J9601 Acute respiratory failure with hypoxia: Secondary | ICD-10-CM | POA: Diagnosis not present

## 2020-01-17 DIAGNOSIS — Z87891 Personal history of nicotine dependence: Secondary | ICD-10-CM | POA: Diagnosis not present

## 2020-01-17 DIAGNOSIS — Z955 Presence of coronary angioplasty implant and graft: Secondary | ICD-10-CM

## 2020-01-17 DIAGNOSIS — E876 Hypokalemia: Secondary | ICD-10-CM

## 2020-01-17 DIAGNOSIS — Z7902 Long term (current) use of antithrombotics/antiplatelets: Secondary | ICD-10-CM

## 2020-01-17 DIAGNOSIS — G40209 Localization-related (focal) (partial) symptomatic epilepsy and epileptic syndromes with complex partial seizures, not intractable, without status epilepticus: Secondary | ICD-10-CM | POA: Diagnosis present

## 2020-01-17 DIAGNOSIS — Z7951 Long term (current) use of inhaled steroids: Secondary | ICD-10-CM | POA: Diagnosis not present

## 2020-01-17 DIAGNOSIS — I252 Old myocardial infarction: Secondary | ICD-10-CM | POA: Diagnosis not present

## 2020-01-17 DIAGNOSIS — F17200 Nicotine dependence, unspecified, uncomplicated: Secondary | ICD-10-CM | POA: Diagnosis present

## 2020-01-17 DIAGNOSIS — E860 Dehydration: Secondary | ICD-10-CM | POA: Diagnosis present

## 2020-01-17 DIAGNOSIS — Z90710 Acquired absence of both cervix and uterus: Secondary | ICD-10-CM

## 2020-01-17 DIAGNOSIS — K219 Gastro-esophageal reflux disease without esophagitis: Secondary | ICD-10-CM | POA: Diagnosis present

## 2020-01-17 DIAGNOSIS — Z6837 Body mass index (BMI) 37.0-37.9, adult: Secondary | ICD-10-CM

## 2020-01-17 DIAGNOSIS — U071 COVID-19: Principal | ICD-10-CM | POA: Diagnosis present

## 2020-01-17 DIAGNOSIS — Z8673 Personal history of transient ischemic attack (TIA), and cerebral infarction without residual deficits: Secondary | ICD-10-CM | POA: Diagnosis not present

## 2020-01-17 DIAGNOSIS — J439 Emphysema, unspecified: Secondary | ICD-10-CM | POA: Diagnosis not present

## 2020-01-17 DIAGNOSIS — E1165 Type 2 diabetes mellitus with hyperglycemia: Secondary | ICD-10-CM | POA: Diagnosis present

## 2020-01-17 DIAGNOSIS — J1282 Pneumonia due to coronavirus disease 2019: Secondary | ICD-10-CM | POA: Diagnosis present

## 2020-01-17 DIAGNOSIS — Z7982 Long term (current) use of aspirin: Secondary | ICD-10-CM | POA: Diagnosis not present

## 2020-01-17 DIAGNOSIS — R0602 Shortness of breath: Secondary | ICD-10-CM | POA: Diagnosis not present

## 2020-01-17 DIAGNOSIS — I25119 Atherosclerotic heart disease of native coronary artery with unspecified angina pectoris: Secondary | ICD-10-CM | POA: Diagnosis present

## 2020-01-17 DIAGNOSIS — I2510 Atherosclerotic heart disease of native coronary artery without angina pectoris: Secondary | ICD-10-CM | POA: Diagnosis present

## 2020-01-17 DIAGNOSIS — E785 Hyperlipidemia, unspecified: Secondary | ICD-10-CM | POA: Diagnosis present

## 2020-01-17 DIAGNOSIS — G40109 Localization-related (focal) (partial) symptomatic epilepsy and epileptic syndromes with simple partial seizures, not intractable, without status epilepticus: Secondary | ICD-10-CM | POA: Diagnosis present

## 2020-01-17 DIAGNOSIS — Z79899 Other long term (current) drug therapy: Secondary | ICD-10-CM | POA: Diagnosis not present

## 2020-01-17 DIAGNOSIS — I10 Essential (primary) hypertension: Secondary | ICD-10-CM | POA: Diagnosis not present

## 2020-01-17 DIAGNOSIS — J811 Chronic pulmonary edema: Secondary | ICD-10-CM | POA: Diagnosis not present

## 2020-01-17 LAB — CBC WITH DIFFERENTIAL/PLATELET
Abs Immature Granulocytes: 0.03 10*3/uL (ref 0.00–0.07)
Basophils Absolute: 0 10*3/uL (ref 0.0–0.1)
Basophils Relative: 0 %
Eosinophils Absolute: 0 10*3/uL (ref 0.0–0.5)
Eosinophils Relative: 0 %
HCT: 37.2 % (ref 36.0–46.0)
Hemoglobin: 12.2 g/dL (ref 12.0–15.0)
Immature Granulocytes: 1 %
Lymphocytes Relative: 8 %
Lymphs Abs: 0.4 10*3/uL — ABNORMAL LOW (ref 0.7–4.0)
MCH: 31.4 pg (ref 26.0–34.0)
MCHC: 32.8 g/dL (ref 30.0–36.0)
MCV: 95.9 fL (ref 80.0–100.0)
Monocytes Absolute: 0.4 10*3/uL (ref 0.1–1.0)
Monocytes Relative: 7 %
Neutro Abs: 4.4 10*3/uL (ref 1.7–7.7)
Neutrophils Relative %: 84 %
Platelets: 218 10*3/uL (ref 150–400)
RBC: 3.88 MIL/uL (ref 3.87–5.11)
RDW: 13.6 % (ref 11.5–15.5)
WBC: 5.2 10*3/uL (ref 4.0–10.5)
nRBC: 0 % (ref 0.0–0.2)

## 2020-01-17 LAB — BASIC METABOLIC PANEL
Anion gap: 10 (ref 5–15)
BUN: 13 mg/dL (ref 8–23)
CO2: 22 mmol/L (ref 22–32)
Calcium: 8 mg/dL — ABNORMAL LOW (ref 8.9–10.3)
Chloride: 101 mmol/L (ref 98–111)
Creatinine, Ser: 0.88 mg/dL (ref 0.44–1.00)
GFR, Estimated: >60 mL/min (ref >60)
Glucose, Bld: 152 mg/dL — ABNORMAL HIGH (ref 70–99)
Potassium: 3 mmol/L — ABNORMAL LOW (ref 3.5–5.1)
Sodium: 133 mmol/L — ABNORMAL LOW (ref 135–145)

## 2020-01-17 LAB — HEPATIC FUNCTION PANEL
ALT: 36 U/L (ref 0–44)
AST: 64 U/L — ABNORMAL HIGH (ref 15–41)
Albumin: 2.8 g/dL — ABNORMAL LOW (ref 3.5–5.0)
Alkaline Phosphatase: 79 U/L (ref 38–126)
Bilirubin, Direct: 0.1 mg/dL (ref 0.0–0.2)
Indirect Bilirubin: 0.5 mg/dL (ref 0.3–0.9)
Total Bilirubin: 0.6 mg/dL (ref 0.3–1.2)
Total Protein: 6.7 g/dL (ref 6.5–8.1)

## 2020-01-17 LAB — C-REACTIVE PROTEIN: CRP: 13.5 mg/dL — ABNORMAL HIGH (ref <1.0)

## 2020-01-17 LAB — LACTIC ACID, PLASMA
Lactic Acid, Venous: 1.1 mmol/L (ref 0.5–1.9)
Lactic Acid, Venous: 1.2 mmol/L (ref 0.5–1.9)

## 2020-01-17 LAB — GLUCOSE, CAPILLARY: Glucose-Capillary: 180 mg/dL — ABNORMAL HIGH (ref 70–99)

## 2020-01-17 LAB — FERRITIN: Ferritin: 369 ng/mL — ABNORMAL HIGH (ref 11–307)

## 2020-01-17 LAB — FIBRINOGEN: Fibrinogen: 632 mg/dL — ABNORMAL HIGH (ref 210–475)

## 2020-01-17 LAB — PROCALCITONIN: Procalcitonin: <0.1 ng/mL

## 2020-01-17 LAB — TRIGLYCERIDES: Triglycerides: 68 mg/dL (ref <150)

## 2020-01-17 LAB — LACTATE DEHYDROGENASE: LDH: 310 U/L — ABNORMAL HIGH (ref 98–192)

## 2020-01-17 LAB — RESP PANEL BY RT-PCR (FLU A&B, COVID) ARPGX2
Influenza A by PCR: NEGATIVE
Influenza B by PCR: NEGATIVE
SARS Coronavirus 2 by RT PCR: POSITIVE — AB

## 2020-01-17 LAB — D-DIMER, QUANTITATIVE: D-Dimer, Quant: 2.77 ug/mL-FEU — ABNORMAL HIGH (ref 0.00–0.50)

## 2020-01-17 MED ORDER — PANTOPRAZOLE SODIUM 40 MG PO TBEC
40.0000 mg | DELAYED_RELEASE_TABLET | Freq: Every day | ORAL | Status: DC
  Administered 2020-01-18 – 2020-01-19 (×2): 40 mg via ORAL
  Filled 2020-01-17 (×2): qty 1

## 2020-01-17 MED ORDER — INSULIN ASPART 100 UNIT/ML ~~LOC~~ SOLN
0.0000 [IU] | Freq: Three times a day (TID) | SUBCUTANEOUS | Status: DC
  Administered 2020-01-18: 3 [IU] via SUBCUTANEOUS
  Administered 2020-01-18: 5 [IU] via SUBCUTANEOUS
  Administered 2020-01-18 – 2020-01-19 (×3): 3 [IU] via SUBCUTANEOUS

## 2020-01-17 MED ORDER — FLUTICASONE FUROATE-VILANTEROL 100-25 MCG/INH IN AEPB
1.0000 | INHALATION_SPRAY | Freq: Every day | RESPIRATORY_TRACT | Status: DC
  Administered 2020-01-18 – 2020-01-19 (×2): 1 via RESPIRATORY_TRACT
  Filled 2020-01-17: qty 28

## 2020-01-17 MED ORDER — ONDANSETRON HCL 4 MG PO TABS: 4.0000 mg | ORAL_TABLET | Freq: Four times a day (QID) | ORAL | Status: DC | PRN

## 2020-01-17 MED ORDER — ZINC SULFATE 220 (50 ZN) MG PO CAPS
220.0000 mg | ORAL_CAPSULE | Freq: Every day | ORAL | Status: DC
  Administered 2020-01-17 – 2020-01-19 (×3): 220 mg via ORAL
  Filled 2020-01-17 (×3): qty 1

## 2020-01-17 MED ORDER — ALBUTEROL SULFATE HFA 108 (90 BASE) MCG/ACT IN AERS: 2.0000 | INHALATION_SPRAY | Freq: Four times a day (QID) | RESPIRATORY_TRACT | Status: DC | PRN

## 2020-01-17 MED ORDER — SODIUM CHLORIDE 0.9 % IV SOLN
100.0000 mg | INTRAVENOUS | Status: AC
  Administered 2020-01-17 (×2): 100 mg via INTRAVENOUS
  Filled 2020-01-17: qty 20

## 2020-01-17 MED ORDER — TIOTROPIUM BROMIDE MONOHYDRATE 18 MCG IN CAPS: 18.0000 ug | ORAL_CAPSULE | Freq: Every day | RESPIRATORY_TRACT | Status: DC

## 2020-01-17 MED ORDER — BISACODYL 5 MG PO TBEC: 5.0000 mg | DELAYED_RELEASE_TABLET | Freq: Every day | ORAL | Status: DC | PRN

## 2020-01-17 MED ORDER — GUAIFENESIN-DM 100-10 MG/5ML PO SYRP: 10.0000 mL | ORAL_SOLUTION | ORAL | Status: DC | PRN

## 2020-01-17 MED ORDER — ONDANSETRON HCL 4 MG/2ML IJ SOLN: 4.0000 mg | Freq: Four times a day (QID) | INTRAMUSCULAR | Status: DC | PRN

## 2020-01-17 MED ORDER — ASPIRIN EC 81 MG PO TBEC
81.0000 mg | DELAYED_RELEASE_TABLET | Freq: Every day | ORAL | Status: DC
  Administered 2020-01-18 – 2020-01-19 (×2): 81 mg via ORAL
  Filled 2020-01-17 (×2): qty 1

## 2020-01-17 MED ORDER — ASCORBIC ACID 500 MG PO TABS
500.0000 mg | ORAL_TABLET | Freq: Every day | ORAL | Status: DC
  Administered 2020-01-17 – 2020-01-19 (×3): 500 mg via ORAL
  Filled 2020-01-17 (×3): qty 1

## 2020-01-17 MED ORDER — TICAGRELOR 90 MG PO TABS
90.0000 mg | ORAL_TABLET | Freq: Two times a day (BID) | ORAL | Status: DC
  Administered 2020-01-17 – 2020-01-19 (×4): 90 mg via ORAL
  Filled 2020-01-17 (×4): qty 1

## 2020-01-17 MED ORDER — POTASSIUM CHLORIDE 10 MEQ/100ML IV SOLN
10.0000 meq | INTRAVENOUS | Status: AC
  Administered 2020-01-17 (×3): 10 meq via INTRAVENOUS
  Filled 2020-01-17 (×3): qty 100

## 2020-01-17 MED ORDER — LACOSAMIDE 50 MG PO TABS
150.0000 mg | ORAL_TABLET | Freq: Two times a day (BID) | ORAL | Status: DC
  Administered 2020-01-17 – 2020-01-19 (×4): 150 mg via ORAL
  Filled 2020-01-17 (×4): qty 3

## 2020-01-17 MED ORDER — ZONISAMIDE 100 MG PO CAPS
200.0000 mg | ORAL_CAPSULE | Freq: Every day | ORAL | Status: DC
  Administered 2020-01-18: 200 mg via ORAL
  Filled 2020-01-17 (×3): qty 2

## 2020-01-17 MED ORDER — ALBUTEROL SULFATE HFA 108 (90 BASE) MCG/ACT IN AERS
8.0000 | INHALATION_SPRAY | Freq: Once | RESPIRATORY_TRACT | Status: AC
  Administered 2020-01-17: 8 via RESPIRATORY_TRACT
  Filled 2020-01-17: qty 6.7

## 2020-01-17 MED ORDER — LACTATED RINGERS IV SOLN: Freq: Once | INTRAVENOUS | Status: DC

## 2020-01-17 MED ORDER — ONDANSETRON HCL 4 MG/2ML IJ SOLN
4.0000 mg | Freq: Once | INTRAMUSCULAR | Status: AC
  Administered 2020-01-17: 4 mg via INTRAVENOUS
  Filled 2020-01-17: qty 2

## 2020-01-17 MED ORDER — INSULIN DETEMIR 100 UNIT/ML ~~LOC~~ SOLN
0.0750 [IU]/kg | Freq: Two times a day (BID) | SUBCUTANEOUS | Status: DC
  Administered 2020-01-17 – 2020-01-19 (×4): 8 [IU] via SUBCUTANEOUS
  Filled 2020-01-17 (×6): qty 0.08

## 2020-01-17 MED ORDER — DEXAMETHASONE SODIUM PHOSPHATE 10 MG/ML IJ SOLN
6.0000 mg | INTRAMUSCULAR | Status: DC
  Administered 2020-01-17 – 2020-01-18 (×2): 6 mg via INTRAVENOUS
  Filled 2020-01-17 (×2): qty 1

## 2020-01-17 MED ORDER — SODIUM CHLORIDE 0.9 % IV SOLN
100.0000 mg | Freq: Every day | INTRAVENOUS | Status: DC
  Administered 2020-01-18 – 2020-01-19 (×2): 100 mg via INTRAVENOUS
  Filled 2020-01-17 (×2): qty 20

## 2020-01-17 MED ORDER — METHYLPREDNISOLONE SODIUM SUCC 125 MG IJ SOLR
125.0000 mg | Freq: Once | INTRAMUSCULAR | Status: AC
  Administered 2020-01-17: 125 mg via INTRAVENOUS
  Filled 2020-01-17: qty 2

## 2020-01-17 MED ORDER — ATORVASTATIN CALCIUM 40 MG PO TABS
40.0000 mg | ORAL_TABLET | Freq: Every day | ORAL | Status: DC
  Administered 2020-01-17 – 2020-01-19 (×3): 40 mg via ORAL
  Filled 2020-01-17 (×3): qty 1

## 2020-01-17 MED ORDER — HYDROCOD POLST-CPM POLST ER 10-8 MG/5ML PO SUER: 5.0000 mL | Freq: Two times a day (BID) | ORAL | Status: DC | PRN

## 2020-01-17 MED ORDER — ENOXAPARIN SODIUM 40 MG/0.4ML ~~LOC~~ SOLN
40.0000 mg | SUBCUTANEOUS | Status: DC
  Administered 2020-01-17 – 2020-01-18 (×2): 40 mg via SUBCUTANEOUS
  Filled 2020-01-17 (×2): qty 0.4

## 2020-01-17 MED ORDER — AEROCHAMBER PLUS FLO-VU MEDIUM MISC
1.0000 | Freq: Once | Status: DC
  Filled 2020-01-17: qty 1

## 2020-01-17 MED ORDER — CARVEDILOL 3.125 MG PO TABS
3.1250 mg | ORAL_TABLET | Freq: Two times a day (BID) | ORAL | Status: DC
  Administered 2020-01-18 – 2020-01-19 (×3): 3.125 mg via ORAL
  Filled 2020-01-17 (×3): qty 1

## 2020-01-17 MED ORDER — INSULIN ASPART 100 UNIT/ML ~~LOC~~ SOLN: 0.0000 [IU] | Freq: Every day | SUBCUTANEOUS | Status: DC

## 2020-01-17 MED ORDER — ACETAMINOPHEN 325 MG PO TABS
650.0000 mg | ORAL_TABLET | Freq: Once | ORAL | Status: AC
  Administered 2020-01-17: 650 mg via ORAL
  Filled 2020-01-17: qty 2

## 2020-01-17 NOTE — ED Notes (Signed)
Call from lab  Critical Value: positive covid   Dr Roderic Palau informed as the PA has no listed number   Donl, RN informed

## 2020-01-17 NOTE — ED Triage Notes (Signed)
Pt c/o of shortness of breath for the past week with covid symptoms. Pt was tested last Sunday for covid with a negative result.  Pt has been taking prednisone 20 mg 2x a day and cefdinir 300 mg once a day since 01/14/20 with no improvement.  Pt has a flat affect but is oriented x4.

## 2020-01-17 NOTE — ED Notes (Signed)
Patient was transferred to the bed out of a wheelchair. It took two of [place] to hold her up and place her on the bed. Pt seems to be too weak to ambulate to check pulse ox.

## 2020-01-17 NOTE — H&P (Addendum)
History and Physical  Courtney Mcconnell HQP:[id number] DOB: Dec 20, 1955 DOA: 01/17/2020  Referring physician: Carmon Sails, PA-C, ED provider PCP: Dettinger, Fransisca Kaufmann, MD  Outpatient Specialists:   Patient Coming From: home  Chief Complaint: SOB, cough, fever  HPI: Courtney Mcconnell is a 64 y.o. female with a history of COPD, GERD, and NSTEMI with drug eluting stents, history of stroke, epilepsy, hypertension, GERD.  Patient seen for increasing fatigue, fever, cough, shortness of breath.  Him started a week ago and have been worsening.  Additionally, she has been having some vomiting and diarrhea.  No palliating or provoking factors.  Her daughter brought her here to the hospital for evaluation.  Emergency Department Course: Checks x-ray shows Covid pneumonia.  Covid positive.  White count normal. inflammatory markers pending  Review of Systems:   Pt denies any vomiting, constipation, abdominal pain, palpitations, headache, vision changes, lightheadedness, dizziness, melena, rectal bleeding.  Review of systems are otherwise negative  Past Medical History:  Diagnosis Date  . Asthma   . Bursitis of left hip    right  . COPD (chronic obstructive pulmonary disease) ([place])   . GERD (gastroesophageal reflux disease)   . Headache(784.0)   . Hyperlipidemia   . Hypertension   . NSTEMI (non-ST elevated myocardial infarction) ([place]) 02/11/2019   PCI/DES x1 to mLAD, focal ostial 70% lesion in 1st diag  . Pneumonia   . Seizure ([place])    most recent 12/12/14  . Sleep apnea    Past Surgical History:  Procedure Laterality Date  . ABDOMINAL HYSTERECTOMY    . APPENDECTOMY    . CATARACT EXTRACTION Bilateral   . CHOLECYSTECTOMY    . CORONARY STENT INTERVENTION N/A 02/11/2019   Procedure: CORONARY STENT INTERVENTION;  Surgeon: Burnell Blanks, MD;  Location: [facility];; mid LAD 70% -- DES PCI (Synergy DES 2.5 x 28 --2.8 mm)  . heal spur     right  . INTRAVASCULAR PRESSURE  WIRE/FFR STUDY N/A 02/11/2019   Procedure: INTRAVASCULAR PRESSURE WIRE/FFR STUDY;  Surgeon: Burnell Blanks, MD;  Location: [facility];  Service: Cardiovascular;  Laterality: N/A;  . LASER PHOTO ABLATION Right 08/27/2013   Procedure: LASER PHOTO ABLATION;  Surgeon: Hayden Pedro, MD;  Location: [facility];  Service: Ophthalmology;  Laterality: Right;  Headscope laser AND Endolaser  . LEFT HEART CATH AND CORONARY ANGIOGRAPHY N/A 02/11/2019   Procedure: LEFT HEART CATH AND CORONARY ANGIOGRAPHY;  Surgeon: Burnell Blanks, MD;  Location: [facility];;; prox RCA 20%. Ost-prox Cx 20%. ostial D1 70% & mLAD 70% => (DES PCI of LAD)  . MEMBRANE PEEL Right 08/27/2013   Procedure: MEMBRANE PEEL;  Surgeon: Hayden Pedro, MD;  Location: [facility];  Service: Ophthalmology;  Laterality: Right;  . PARS PLANA VITRECTOMY Right 08/27/2013   Procedure: PARS PLANA VITRECTOMY WITH 25 GAUGE;  Surgeon: Hayden Pedro, MD;  Location: [facility];  Service: Ophthalmology;  Laterality: Right;   Social History:  reports that she quit smoking about 5 years ago. Her smoking use included cigarettes. She has a 10.50 pack-year smoking history. She has never used smokeless tobacco. She reports that she does not drink alcohol and does not use drugs. Patient lives at home  Allergies  Allergen Reactions  . Bee Venom Swelling  . Amoxicillin-Pot Clavulanate Rash  . Doxycycline Hyclate Rash    Rash and itching    Family History  Problem Relation Age of Onset  . Diabetes Father   .  Hypertension Father   . Hyperlipidemia Father   . COPD Mother   . Diabetes Brother   . Stroke Paternal Grandmother   . Cancer Sister   . Cancer Paternal Uncle   . Colon cancer Neg Hx       Prior to Admission medications   Medication Sig Start Date End Date Taking? Authorizing Provider  albuterol (VENTOLIN HFA) 108 (90 Base) MCG/ACT inhaler INHALE TWO PUFFS INTO THE LUNGS EVERY 6 HOURS AS NEEDED FOR WHEEZING OR SHORTNESS OF  BREATH Patient taking differently: Inhale 2 puffs into the lungs every 6 (six) hours as needed for wheezing.  06/02/18   Dettinger, Fransisca Kaufmann, MD  aspirin EC 81 MG EC tablet Take 1 tablet (81 mg total) by mouth daily. 02/12/19   Cheryln Manly, NP  atorvastatin (LIPITOR) 40 MG tablet TAKE 1 TABLET BY MOUTH DAILY AT 6PM 09/14/19   Leonie Man, MD  BRILINTA 90 MG TABS tablet TAKE 1 TABLET BY MOUTH TWICE DAILY 12/21/19   Leonie Man, MD  carvedilol (COREG) 3.125 MG tablet TAKE 1 TABLET BY MOUTH TWICE DAILY WITH A MEAL 11/19/19   Leonie Man, MD  cefdinir (OMNICEF) 300 MG capsule Take 1 capsule (300 mg total) by mouth 2 (two) times daily. 1 po BID 01/13/20   Dettinger, Fransisca Kaufmann, MD  Fluticasone-Salmeterol (ADVAIR DISKUS) 100-50 MCG/DOSE AEPB Inhale 1 puff into the lungs in the morning and at bedtime. 05/27/19   Dettinger, Fransisca Kaufmann, MD  ibuprofen (ADVIL) 600 MG tablet Take 1 tablet (600 mg total) by mouth every 8 (eight) hours as needed. 10/23/19   Ivy Lynn, NP  lidocaine (XYLOCAINE) 5 % ointment Apply 1 application topically daily as needed. 09/15/19   Loman Brooklyn, FNP  losartan (COZAAR) 25 MG tablet Take 1 tablet (25 mg total) by mouth daily. 09/14/19 12/13/19  Leonie Man, MD  nitroGLYCERIN (NITROSTAT) 0.4 MG SL tablet Place 1 tablet (0.4 mg total) under the tongue every 5 (five) minutes as needed for chest pain. Patient not taking: Reported on 10/23/2019 07/30/18   Dettinger, Fransisca Kaufmann, MD  omeprazole (PRILOSEC OTC) 20 MG tablet Take 20 mg by mouth daily.    [provider]  predniSONE (DELTASONE) 20 MG tablet 2 po at same time daily for 5 days 01/13/20   Dettinger, Fransisca Kaufmann, MD  SPIRIVA HANDIHALER 18 MCG inhalation capsule place ONE CAPSULE into inhaler AND INHALE DAILY 11/19/19   Dettinger, Fransisca Kaufmann, MD  VIMPAT 100 MG TABS Take 150 mg by mouth 2 (two) times daily. Takes 600mg  two pills in the morning and two in the afternoon 03/03/18   [provider]  zonisamide  (ZONEGRAN) 100 MG capsule Take 200 mg by mouth at bedtime.  06/13/17   [provider]    Physical Exam: BP (!) 120/57   Pulse 61   Temp (!) 100.6 F (38.1 C) (Rectal)   Resp (!) 30   Ht 5\' 6"  (1.676 m)   Wt 104.3 kg   SpO2 95%   BMI 37.12 kg/m   . General: Elderly female.  Hypersomnolent. No acute cardiopulmonary distress.  Marland Kitchen HEENT: Normocephalic atraumatic.  Right and left ears normal in appearance.  Pupils equal, round, reactive to light. Extraocular muscles are intact. Sclerae anicteric and noninjected.  Moist mucosal membranes. No mucosal lesions.  . Neck: Neck supple without lymphadenopathy. No carotid bruits. No masses palpated.  . Cardiovascular: Regular rate with normal S1-S2 sounds. No murmurs, rubs, gallops auscultated. No  JVD.  . Respiratory: Diffuse Rales with wheezing.  No accessory muscle use. . Abdomen: Soft, nontender, nondistended. Active bowel sounds. No masses or hepatosplenomegaly  . Skin: No rashes, lesions, or ulcerations.  Dry, warm to touch. 2+ dorsalis pedis and radial pulses. . Musculoskeletal: No calf or leg pain. All major joints not erythematous nontender.  No upper or lower joint deformation.  Good ROM.  No contractures  . Psychiatric: Intact judgment and insight. Pleasant and cooperative. . Neurologic: No focal neurological deficits. Strength is 5/5 and symmetric in upper and lower extremities.  Cranial nerves II through XII are grossly intact.           Labs on Admission: I have personally reviewed following labs and imaging studies  CBC: Recent Labs  Lab 01/17/20 1420  WBC 5.2  NEUTROABS 4.4  HGB 12.2  HCT 37.2  MCV 95.9  PLT 836   Basic Metabolic Panel: Recent Labs  Lab 01/17/20 1420  NA 133*  K 3.0*  CL 101  CO2 22  GLUCOSE 152*  BUN 13  CREATININE 0.88  CALCIUM 8.0*   GFR: Estimated Creatinine Clearance: 78.8 mL/min (by C-G formula based on SCr of 0.88 mg/dL). Liver Function Tests: No results for input(s): AST, ALT,  ALKPHOS, BILITOT, PROT, ALBUMIN in the last 168 hours. No results for input(s): LIPASE, AMYLASE in the last 168 hours. No results for input(s): AMMONIA in the last 168 hours. Coagulation Profile: No results for input(s): INR, PROTIME in the last 168 hours. Cardiac Enzymes: No results for input(s): CKTOTAL, CKMB, CKMBINDEX, TROPONINI in the last 168 hours. BNP (last 3 results) No results for input(s): PROBNP in the last 8760 hours. HbA1C: No results for input(s): HGBA1C in the last 72 hours. CBG: No results for input(s): GLUCAP in the last 168 hours. Lipid Profile: No results for input(s): CHOL, HDL, LDLCALC, TRIG, CHOLHDL, LDLDIRECT in the last 72 hours. Thyroid Function Tests: No results for input(s): TSH, T4TOTAL, FREET4, T3FREE, THYROIDAB in the last 72 hours. Anemia Panel: No results for input(s): VITAMINB12, FOLATE, FERRITIN, TIBC, IRON, RETICCTPCT in the last 72 hours. Urine analysis:    Component Value Date/Time   COLORURINE STRAW (A) 05/26/2016 0008   APPEARANCEUR CLEAR 05/26/2016 0008   LABSPEC 1.004 (L) 05/26/2016 0008   PHURINE 7.0 05/26/2016 0008   GLUCOSEU NEGATIVE 05/26/2016 0008   HGBUR NEGATIVE 05/26/2016 0008   BILIRUBINUR NEGATIVE 05/26/2016 0008   KETONESUR NEGATIVE 05/26/2016 0008   PROTEINUR NEGATIVE 05/26/2016 0008   UROBILINOGEN 0.2 04/18/2014 1712   NITRITE NEGATIVE 05/26/2016 0008   LEUKOCYTESUR NEGATIVE 05/26/2016 0008   Sepsis Labs: @LABRCNTIP (procalcitonin:4,lacticidven:4) ) Recent Results (from the past 240 hour(s))  Resp Panel by RT-PCR (Flu A&B, Covid) Nasopharyngeal Swab     Status: Abnormal   Collection Time: 01/17/20 12:52 PM   Specimen: Nasopharyngeal Swab; Nasopharyngeal(NP) swabs in vial transport medium  Result Value Ref Range Status   SARS Coronavirus 2 by RT PCR POSITIVE (A) NEGATIVE Final    Comment: RESULT CALLED TO, READ BACK BY AND VERIFIED WITH: ANNE TUTTLE,RN  @1409  01/17/2020 KAY (NOTE) SARS-CoV-2 target nucleic acids are  DETECTED.  The SARS-CoV-2 RNA is generally detectable in upper respiratory specimens during the acute phase of infection. Positive results are indicative of the presence of the identified virus, but do not rule out bacterial infection or co-infection with other pathogens not detected by the test. Clinical correlation with patient history and other diagnostic information is necessary to determine patient infection status. The expected result is Negative.  Fact Sheet for Patients: EntrepreneurPulse.com.au  Fact Sheet for Healthcare Providers: IncredibleEmployment.be  This test is not yet approved or cleared by the [place] FDA and  has been authorized for detection and/or diagnosis of SARS-CoV-2 by FDA under an Emergency Use Authorization (EUA).  This EUA will remain in effect (meaning this test can  be used) for the duration of  the COVID-19 declaration under Section 564(b)(1) of the Act, 21 U.S.C. section 360bbb-3(b)(1), unless the authorization is terminated or revoked sooner.     Influenza A by PCR NEGATIVE NEGATIVE Final   Influenza B by PCR NEGATIVE NEGATIVE Final    Comment: (NOTE) The Xpert Xpress SARS-CoV-2/FLU/RSV plus assay is intended as an aid in the diagnosis of influenza from Nasopharyngeal swab specimens and should not be used as a sole basis for treatment. Nasal washings and aspirates are unacceptable for Xpert Xpress SARS-CoV-2/FLU/RSV testing.  Fact Sheet for Patients: EntrepreneurPulse.com.au  Fact Sheet for Healthcare Providers: IncredibleEmployment.be  This test is not yet approved or cleared by the [place] FDA and has been authorized for detection and/or diagnosis of SARS-CoV-2 by FDA under an Emergency Use Authorization (EUA). This EUA will remain in effect (meaning this test can be used) for the duration of the COVID-19 declaration under Section 564(b)(1) of the Act, 21  U.S.C. section 360bbb-3(b)(1), unless the authorization is terminated or revoked.  Performed at [facility], [address], [place],  [address]      Radiological Exams on Admission: DG Chest Portable 1 View  Result Date: 01/17/2020 CLINICAL DATA:  Shortness of breath EXAM: PORTABLE CHEST 1 VIEW COMPARISON:  02/10/2019 FINDINGS: 1332 hours. The cardio pericardial silhouette is enlarged. There is pulmonary vascular congestion without overt pulmonary edema. Patchy peripherally oriented ground-glass airspace disease noted bilaterally with more confluent consolidative opacity at the bases. Telemetry leads overlie the chest. IMPRESSION: 1. Cardiac enlargement with pulmonary vascular congestion. 2. Patchy peripherally oriented ground-glass airspace disease with more confluent consolidative opacity at the bases. Imaging features are compatible with multifocal pneumonia. Electronically Signed   By: Misty Stanley M.D.   On: 01/17/2020 14:19    EKG: Independently reviewed.  Sinus rhythm with incomplete right bundle branch block.  Prolonged QTC.  No acute ST changes  Assessment/Plan: Principal Problem:   Pneumonia due to COVID-19 virus Active Problems:   Tobacco use disorder   Essential hypertension   Pulmonary emphysema (HCC)   Focal epilepsy with impairment of consciousness (HCC)   History of non-ST elevation myocardial infarction (NSTEMI)   Cerebral infarction, remote, resolved   Coronary artery disease involving native coronary artery of native heart with angina pectoris [facility])    This patient was discussed with the ED physician, including pertinent vitals, physical exam findings, labs, and imaging.  We also discussed care given by the ED provider.  1. COVID-19 pneumonia a. Admit b. Remdesivir, steroids c. Continue to track inflammatory markers d. Oxygen as needed e. Antitussives f. Proning as able 2. COPD/pulmonary emphysema a. Will be on steroids 3. CAD with history of NSTEMI  and hypertension a. Continue aspirin and antihypertensives 4. History of stroke 5. Seizure disorder a. Continue antiepileptics  DVT prophylaxis: Lovenox Consultants: None Code Status: Full code Family Communication: Daughter present during interview and exam Disposition Plan: Should be able to return home   Truett Mainland, DO

## 2020-01-17 NOTE — ED Provider Notes (Signed)
[facility] EMERGENCY DEPARTMENT Provider Note   CSN: [id number] Arrival date & time: 01/17/20  1219     History Chief Complaint  Patient presents with  . Shortness of Breath    Courtney Mcconnell is a 64 y.o. female with history of CAD s/p PCI, non-STEMI, seizures, sleep apnea not on CPAP, COPD/emphysema presents to the ED from home with daughter for evaluation of sudden onset, worsening flulike symptoms.  Onset 1 week ago.  Reports associated nasal congestion, productive cough, body aches.  Patient tells me she has been vomiting and having diarrhea as well, daughter was not aware of this.  Patient unable to quantify but states "a lot".  Daughter states patient has not been acting herself lately, usually pretty active, talkative but now sleeping a lot more, has been very thirsty and wanting to drink a lot of water.  Over the last couple of days patient has reported left-sided chest pain with breathing.  Cannot catch her breath.  Daughter measured SPO2 at home after walking and it was 88%. Possible fever but unknown.  History of COPD and compliant with Advair and Spiriva.  Seen by PCP recently and prescribed prednisone and cefdinir on 12/1 but no better.  Daughter states patient has refused to get the Covid and influenza vaccines.  Usually does not use oxygen at home.  No recent sick contacts, travel.  Patient was prescribed CPAP for sleep apnea but could not tolerate it.  Denies headache, falls, abdominal pain, dysuria.    HPI     Past Medical History:  Diagnosis Date  . Asthma   . Bursitis of left hip    right  . COPD (chronic obstructive pulmonary disease) ([place])   . GERD (gastroesophageal reflux disease)   . Headache(784.0)   . Hyperlipidemia   . Hypertension   . NSTEMI (non-ST elevated myocardial infarction) ([place]) 02/11/2019   PCI/DES x1 to mLAD, focal ostial 70% lesion in 1st diag  . Pneumonia   . Seizure ([place])    most recent 12/12/14  . Sleep apnea     Patient Active Problem  List   Diagnosis Date Noted  . Left shoulder pain 10/23/2019  . Decreased ROM of left shoulder 10/23/2019  . Coronary artery disease involving native coronary artery of native heart with angina pectoris ([place]) 05/22/2019  . Hyperlipidemia with target LDL less than 70 -> CAD 02/12/2019  . Presence of drug coated stent in LAD coronary artery 02/11/2019  . History of non-ST elevation myocardial infarction (NSTEMI) 02/10/2019  . Cerebral infarction, remote, resolved 06/27/2018  . Cellulitis 04/12/2018  . Cellulitis of left upper arm   . Pulmonary nodules 12/08/2015  . 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 Sleep apnea 04/06/2015  . Pulmonary emphysema ([place]) 03/02/2015  . Smoker 02/16/2015  . Seizure disorder ([place]) 01/21/2015  . Focal epilepsy with impairment of consciousness ([place]) 12/13/2014  . Seizures ([place]) 04/18/2014  . Essential hypertension 04/18/2014  . 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 Tobacco use disorder 07/19/2012    Past Surgical History:  Procedure Laterality Date  . ABDOMINAL HYSTERECTOMY    . APPENDECTOMY    . CATARACT EXTRACTION Bilateral   . CHOLECYSTECTOMY    . CORONARY STENT INTERVENTION N/A 02/11/2019   Procedure: CORONARY STENT INTERVENTION;  Surgeon: Burnell Blanks, MD;  Location: [place] [facility];; mid LAD 70% -- DES PCI (Synergy DES 2.5 x 28 --2.8 mm)  . heal spur     right  . INTRAVASCULAR PRESSURE WIRE/FFR  STUDY N/A 02/11/2019   Procedure: INTRAVASCULAR PRESSURE WIRE/FFR STUDY;  Surgeon: Burnell Blanks, MD;  Location: [place] [facility];  Service: Cardiovascular;  Laterality: N/A;  . LASER PHOTO ABLATION Right 08/27/2013   Procedure: LASER PHOTO ABLATION;  Surgeon: Hayden Pedro, MD;  Location: [place];  Service: Ophthalmology;  Laterality: Right;  Headscope laser AND Endolaser  . LEFT HEART CATH AND CORONARY ANGIOGRAPHY N/A 02/11/2019   Procedure: LEFT HEART CATH AND CORONARY ANGIOGRAPHY;  Surgeon: Burnell Blanks, MD;  Location: [place] INVASIVE CV LAB;;; prox RCA 20%. Ost-prox Cx 20%. ostial D1 70% & mLAD 70% => (DES PCI of LAD)  . MEMBRANE PEEL Right 08/27/2013   Procedure: MEMBRANE PEEL;  Surgeon: Hayden Pedro, MD;  Location: [place];  Service: Ophthalmology;  Laterality: Right;  . PARS PLANA VITRECTOMY Right 08/27/2013   Procedure: PARS PLANA VITRECTOMY WITH 25 GAUGE;  Surgeon: Hayden Pedro, MD;  Location: [place];  Service: Ophthalmology;  Laterality: Right;     OB History    Gravida  2   Para  2   Term  2   Preterm      AB      Living  3     SAB      TAB      Ectopic      Multiple      Live Births              Family History  Problem Relation Age of Onset  . Diabetes Father   . Hypertension Father   . Hyperlipidemia Father   . COPD Mother   . Diabetes Brother   . Stroke Paternal Grandmother   . Cancer Sister   . Cancer Paternal Uncle   . Colon cancer Neg Hx     Social History   Tobacco Use  . Smoking status: Former Smoker    Packs/day: 0.25    Years: 42.00    Pack years: 10.50    Types: Cigarettes    Quit date: 03/20/2014    Years since quitting: 5.8  . Smokeless tobacco: Never Used  . Tobacco comment: 01/21/15 restarted smoking 4 mos ago  Vaping Use  . Vaping Use: Never used  Substance Use Topics  . Alcohol use: No    Alcohol/week: 0.0 standard drinks  . Drug use: No    Home Medications Prior to Admission medications   Medication Sig Start Date End Date Taking? Authorizing Provider  albuterol (VENTOLIN HFA) 108 (90 Base) MCG/ACT inhaler INHALE TWO PUFFS INTO THE LUNGS EVERY 6 HOURS AS NEEDED FOR WHEEZING OR SHORTNESS OF BREATH Patient taking differently: Inhale 2 puffs into the lungs every 6 (six) hours as needed for wheezing.  06/02/18   Dettinger, Fransisca Kaufmann, MD  aspirin EC 81 MG EC tablet Take 1 tablet (81 mg total) by mouth daily. 02/12/19   Cheryln Manly, NP  atorvastatin (LIPITOR) 40 MG tablet TAKE 1 TABLET BY MOUTH DAILY AT 6PM 09/14/19    Leonie Man, MD  BRILINTA 90 MG TABS tablet TAKE 1 TABLET BY MOUTH TWICE DAILY 12/21/19   Leonie Man, MD  carvedilol (COREG) 3.125 MG tablet TAKE 1 TABLET BY MOUTH TWICE DAILY WITH A MEAL 11/19/19   Leonie Man, MD  cefdinir (OMNICEF) 300 MG capsule Take 1 capsule (300 mg total) by mouth 2 (two) times daily. 1 po BID 01/13/20   Dettinger, Fransisca Kaufmann, MD  Fluticasone-Salmeterol (ADVAIR DISKUS) 100-50 MCG/DOSE AEPB Inhale 1  puff into the lungs in the morning and at bedtime. 05/27/19   Dettinger, Fransisca Kaufmann, MD  ibuprofen (ADVIL) 600 MG tablet Take 1 tablet (600 mg total) by mouth every 8 (eight) hours as needed. 10/23/19   Ivy Lynn, NP  lidocaine (XYLOCAINE) 5 % ointment Apply 1 application topically daily as needed. 09/15/19   Loman Brooklyn, FNP  losartan (COZAAR) 25 MG tablet Take 1 tablet (25 mg total) by mouth daily. 09/14/19 12/13/19  Leonie Man, MD  nitroGLYCERIN (NITROSTAT) 0.4 MG SL tablet Place 1 tablet (0.4 mg total) under the tongue every 5 (five) minutes as needed for chest pain. Patient not taking: Reported on 10/23/2019 07/30/18   Dettinger, Fransisca Kaufmann, MD  omeprazole (PRILOSEC OTC) 20 MG tablet Take 20 mg by mouth daily.    [provider]  predniSONE (DELTASONE) 20 MG tablet 2 po at same time daily for 5 days 01/13/20   Dettinger, Fransisca Kaufmann, MD  SPIRIVA HANDIHALER 18 MCG inhalation capsule place ONE CAPSULE into inhaler AND INHALE DAILY 11/19/19   Dettinger, Fransisca Kaufmann, MD  VIMPAT 100 MG TABS Take 150 mg by mouth 2 (two) times daily. Takes 600mg  two pills in the morning and two in the afternoon 03/03/18   [provider]  zonisamide (ZONEGRAN) 100 MG capsule Take 200 mg by mouth at bedtime.  06/13/17   [provider]    Allergies    Bee venom, Amoxicillin-pot clavulanate, and Doxycycline hyclate  Review of Systems   Review of Systems  Constitutional: Positive for activity change, appetite change and fever.  Respiratory: Positive for cough,  shortness of breath and wheezing.   Cardiovascular: Positive for chest pain.  Gastrointestinal: Positive for diarrhea and vomiting.  Musculoskeletal: Positive for myalgias.  All other systems reviewed and are negative.   Physical Exam Updated Vital Signs BP 100/69   Pulse 63   Temp (!) 100.6 F (38.1 C) (Rectal)   Resp (!) 30   Ht 5\' 6"  (1.676 m)   Wt 104.3 kg   SpO2 96%   BMI 37.12 kg/m   Physical Exam Vitals and nursing note reviewed.  Constitutional:      Appearance: She is well-developed. She is ill-appearing.     Comments: Patient is sleep, appears tired.  No acute distress.  HENT:     Head: Normocephalic and atraumatic.     Nose: Nose normal.     Mouth/Throat:     Comments: Dry lips and mucous membranes Eyes:     Conjunctiva/sclera: Conjunctivae normal.  Cardiovascular:     Rate and Rhythm: Normal rate and regular rhythm.     Comments: 1+ radial DP pulses.  No lower extremity edema. Pulmonary:     Effort: Pulmonary effort is normal.     Breath sounds: Examination of the right-lower field reveals decreased breath sounds. Examination of the left-lower field reveals decreased breath sounds. Decreased breath sounds and wheezing present.     Comments: Wheezing in upper right lobe, diminished lung sounds bibasilar.  SPO2 around 90-94% on room air at rest.  Speaking in short sentences.  No significant increased work of breathing at rest.  No crackles. Abdominal:     General: Bowel sounds are normal.     Palpations: Abdomen is soft.     Tenderness: There is abdominal tenderness.     Comments: Left-sided CVA tenderness.  Abdomen soft, obese.  No G/R/R. No suprapubic or CVA tenderness. Negative Murphy's and McBurney's.   Musculoskeletal:  General: Normal range of motion.     Cervical back: Normal range of motion.  Skin:    General: Skin is warm and dry.     Capillary Refill: Capillary refill takes less than 2 seconds.  Neurological:     Mental Status: She is  alert.     Comments: Oriented to full name, hospital and daughter at bedside, events.  Speech slowed but clear without dysarthria or aphasia.  No signs of neglect.  Sensation to light touch in face, upper and lower extremities intact.  Strength in upper and lower extremities symmetric bilaterally, no facial droop.  Psychiatric:        Behavior: Behavior normal.     ED Results / Procedures / Treatments   Labs (all labs ordered are listed, but only abnormal results are displayed) Labs Reviewed  RESP PANEL BY RT-PCR (FLU A&B, COVID) ARPGX2 - Abnormal; Notable for the following components:      Result Value   SARS Coronavirus 2 by RT PCR POSITIVE (*)    All other components within normal limits  CBC WITH DIFFERENTIAL/PLATELET - Abnormal; Notable for the following components:   Lymphs Abs 0.4 (*)    All other components within normal limits  BASIC METABOLIC PANEL - Abnormal; Notable for the following components:   Sodium 133 (*)    Potassium 3.0 (*)    Glucose, Bld 152 (*)    Calcium 8.0 (*)    All other components within normal limits  LACTIC ACID, PLASMA  LACTIC ACID, PLASMA  URINALYSIS, ROUTINE W REFLEX MICROSCOPIC    EKG None  Radiology DG Chest Portable 1 View  Result Date: 01/17/2020 CLINICAL DATA:  Shortness of breath EXAM: PORTABLE CHEST 1 VIEW COMPARISON:  02/10/2019 FINDINGS: 1332 hours. The cardio pericardial silhouette is enlarged. There is pulmonary vascular congestion without overt pulmonary edema. Patchy peripherally oriented ground-glass airspace disease noted bilaterally with more confluent consolidative opacity at the bases. Telemetry leads overlie the chest. IMPRESSION: 1. Cardiac enlargement with pulmonary vascular congestion. 2. Patchy peripherally oriented ground-glass airspace disease with more confluent consolidative opacity at the bases. Imaging features are compatible with multifocal pneumonia. Electronically Signed   By: Misty Stanley M.D.   On: 01/17/2020  14:19    Procedures .Critical Care Performed by: Kinnie Feil, PA-C Authorized by: Kinnie Feil, PA-C   Critical care provider statement:    Critical care time (minutes):  45   Critical care was necessary to treat or prevent imminent or life-threatening deterioration of the following conditions:  Sepsis and respiratory failure   Critical care was time spent personally by me on the following activities:  Discussions with consultants, evaluation of patient's response to treatment, examination of patient, ordering and performing treatments and interventions, ordering and review of laboratory studies, ordering and review of radiographic studies, pulse oximetry, re-evaluation of patient's condition, obtaining history from patient or surrogate, review of old charts and development of treatment plan with patient or surrogate   I assumed direction of critical care for this patient from another provider in my specialty: no     (including critical care time)  Medications Ordered in ED Medications  AeroChamber Plus Flo-Vu Medium MISC 1 each (1 each Other Not Given 01/17/20 1453)  remdesivir 100 mg in sodium chloride 0.9 % 100 mL IVPB (has no administration in time range)  acetaminophen (TYLENOL) tablet 650 mg (650 mg Oral Given 01/17/20 1451)  ondansetron (ZOFRAN) injection 4 mg (4 mg Intravenous Given 01/17/20 1505)  methylPREDNISolone sodium  succinate (SOLU-MEDROL) 125 mg/2 mL injection 125 mg (125 mg Intravenous Given 01/17/20 1505)  albuterol (VENTOLIN HFA) 108 (90 Base) MCG/ACT inhaler 8 puff (8 puffs Inhalation Given 01/17/20 1450)  remdesivir 100 mg in sodium chloride 0.9 % 100 mL IVPB (100 mg Intravenous New Bag/Given 01/17/20 1556)    ED Course  I have reviewed the triage vital signs and the nursing notes.  Pertinent labs & imaging results that were available during my care of the patient were reviewed by me and considered in my medical decision making (see chart for  details).  Clinical Course as of Jan 17 1635  Sun Jan 17, 2020  1414 Temp(!): 100.6 F (38.1 C) [CG]  1414 SARS Coronavirus 2 by RT PCR(!): POSITIVE [CG]  1436 IMPRESSION: 1. Cardiac enlargement with pulmonary vascular congestion. 2. Patchy peripherally oriented ground-glass airspace disease with more confluent consolidative opacity at the bases. Imaging features are compatible with multifocal pneumonia.  DG Chest Portable 1 View [CG]  1633 EKG with NSR with PVC, ?RBBB, Qtc 537  EKG 12-Lead [CG]    Clinical Course User Index [CG] Arlean Hopping   MDM Rules/Calculators/A&P                          64 year old female with history of COPD/emphysema, sleep apnea on vaccinated for COVID/influenza presents for worsening flulike symptoms for the last week, chest pain, shortness of breath, cough, vomiting, diarrhea, wheezing.  Has been on prednisone and cefdinir since 12/1.  Patient appears very dehydrated clinically, weak.  Requiring 2 people to assist during transfers.  Rectal fever 100.6.  EMR, triage nurse notes reviewed to obtain more history and assist with MDM.  Additional information obtained from daughter.  Labs, imaging ordered as above.  DDx includes viral illness like Covid or influenza, pneumonia, COPD exacerbation.  Symptoms not consistent with ACS.  Has left CVA tenderness but no anterior abdominal tenderness, UTI considered as well as a secondary diagnosis.  1445: Respiratory panel positive for COVID-19.  Chest x-ray shows cardiac enlargement with pulmonary vascular congestion, peripheral patchy groundglass airspace disease, worse at the bases consistent with multifocal pneumonia.  Medicines ordered: Tylenol, Zofran, Solu-Medrol, remdesivir per pharmacy, 8 puffs albuterol inhaler with spacer.  ER work-up personally visualized and interpreted  Labs reveal K3.0, hyperglycemia without anion gap.  Normal WBC, hemoglobin, creatinine and lactic acid.  Patient  reevaluated after breathing treatments and appears more awake, states he feels little bit better.  Patient needed 2 person assist to stand at the side of the bed and SPO2 dropped to 89-90% with minimal exertion.  Discussed with Dr. Sheran Lawless who will admit patient for hypoxia due to Covid pneumonia/COPD exacerbation.  Technically patient meets SIRS criteria with temperature 100.6, tachypnea, transient hypoxia.  Normal lactic acid.  Blood pressure has been stable.  No other evidence of endorgan damage.  Will hold off on antibiotics, IV fluids at this time given that etiology is viral.  Final Clinical Impression(s) / ED Diagnoses Final diagnoses:  Pneumonia due to COVID-19 virus    Rx / DC Orders ED Discharge Orders    None       Kinnie Feil, PA-C 01/17/20 1636    Milton Ferguson, MD 01/18/20 [phone]

## 2020-01-18 DIAGNOSIS — U071 COVID-19: Principal | ICD-10-CM

## 2020-01-18 DIAGNOSIS — I10 Essential (primary) hypertension: Secondary | ICD-10-CM

## 2020-01-18 DIAGNOSIS — J9601 Acute respiratory failure with hypoxia: Secondary | ICD-10-CM

## 2020-01-18 DIAGNOSIS — J1282 Pneumonia due to coronavirus disease 2019: Secondary | ICD-10-CM

## 2020-01-18 DIAGNOSIS — F17200 Nicotine dependence, unspecified, uncomplicated: Secondary | ICD-10-CM

## 2020-01-18 DIAGNOSIS — E876 Hypokalemia: Secondary | ICD-10-CM

## 2020-01-18 LAB — C-REACTIVE PROTEIN: CRP: 12.4 mg/dL — ABNORMAL HIGH (ref <1.0)

## 2020-01-18 LAB — COMPREHENSIVE METABOLIC PANEL
ALT: 34 U/L (ref 0–44)
AST: 56 U/L — ABNORMAL HIGH (ref 15–41)
Albumin: 2.6 g/dL — ABNORMAL LOW (ref 3.5–5.0)
Alkaline Phosphatase: 72 U/L (ref 38–126)
Anion gap: 10 (ref 5–15)
BUN: 17 mg/dL (ref 8–23)
CO2: 21 mmol/L — ABNORMAL LOW (ref 22–32)
Calcium: 8.1 mg/dL — ABNORMAL LOW (ref 8.9–10.3)
Chloride: 106 mmol/L (ref 98–111)
Creatinine, Ser: 0.79 mg/dL (ref 0.44–1.00)
GFR, Estimated: >60 mL/min (ref >60)
Glucose, Bld: 186 mg/dL — ABNORMAL HIGH (ref 70–99)
Potassium: 3.3 mmol/L — ABNORMAL LOW (ref 3.5–5.1)
Sodium: 137 mmol/L (ref 135–145)
Total Bilirubin: 0.4 mg/dL (ref 0.3–1.2)
Total Protein: 6.4 g/dL — ABNORMAL LOW (ref 6.5–8.1)

## 2020-01-18 LAB — GLUCOSE, CAPILLARY
Glucose-Capillary: 170 mg/dL — ABNORMAL HIGH (ref 70–99)
Glucose-Capillary: 216 mg/dL — ABNORMAL HIGH (ref 70–99)
Glucose-Capillary: 184 mg/dL — ABNORMAL HIGH (ref 70–99)
Glucose-Capillary: 163 mg/dL — ABNORMAL HIGH (ref 70–99)

## 2020-01-18 LAB — CBC WITH DIFFERENTIAL/PLATELET
Abs Immature Granulocytes: 0.02 10*3/uL (ref 0.00–0.07)
Basophils Absolute: 0 10*3/uL (ref 0.0–0.1)
Basophils Relative: 0 %
Eosinophils Absolute: 0 10*3/uL (ref 0.0–0.5)
Eosinophils Relative: 0 %
HCT: 38.6 % (ref 36.0–46.0)
Hemoglobin: 12.7 g/dL (ref 12.0–15.0)
Immature Granulocytes: 1 %
Lymphocytes Relative: 16 %
Lymphs Abs: 0.5 10*3/uL — ABNORMAL LOW (ref 0.7–4.0)
MCH: 31.7 pg (ref 26.0–34.0)
MCHC: 32.9 g/dL (ref 30.0–36.0)
MCV: 96.3 fL (ref 80.0–100.0)
Monocytes Absolute: 0.2 10*3/uL (ref 0.1–1.0)
Monocytes Relative: 7 %
Neutro Abs: 2.3 10*3/uL (ref 1.7–7.7)
Neutrophils Relative %: 76 %
Platelets: 218 10*3/uL (ref 150–400)
RBC: 4.01 MIL/uL (ref 3.87–5.11)
RDW: 13.5 % (ref 11.5–15.5)
WBC: 3 10*3/uL — ABNORMAL LOW (ref 4.0–10.5)
nRBC: 0 % (ref 0.0–0.2)

## 2020-01-18 LAB — D-DIMER, QUANTITATIVE: D-Dimer, Quant: 2.6 ug/mL-FEU — ABNORMAL HIGH (ref 0.00–0.50)

## 2020-01-18 LAB — MAGNESIUM: Magnesium: 2.6 mg/dL — ABNORMAL HIGH (ref 1.7–2.4)

## 2020-01-18 LAB — FERRITIN: Ferritin: 400 ng/mL — ABNORMAL HIGH (ref 11–307)

## 2020-01-18 MED ORDER — SACCHAROMYCES BOULARDII 250 MG PO CAPS
250.0000 mg | ORAL_CAPSULE | Freq: Two times a day (BID) | ORAL | Status: DC
  Administered 2020-01-18 – 2020-01-19 (×2): 250 mg via ORAL
  Filled 2020-01-18 (×2): qty 1

## 2020-01-18 MED ORDER — POTASSIUM CHLORIDE CRYS ER 20 MEQ PO TBCR
20.0000 meq | EXTENDED_RELEASE_TABLET | Freq: Once | ORAL | Status: AC
  Administered 2020-01-18: 20 meq via ORAL
  Filled 2020-01-18: qty 1

## 2020-01-18 NOTE — Plan of Care (Signed)
  Problem: Activity: Goal: Ability to tolerate increased activity will improve Outcome: Progressing   Problem: Clinical Measurements: Goal: Ability to maintain a body temperature in the normal range will improve Outcome: Progressing   Problem: Respiratory: Goal: Ability to maintain adequate ventilation will improve Outcome: Progressing Goal: Ability to maintain a clear airway will improve Outcome: Progressing   

## 2020-01-18 NOTE — Progress Notes (Signed)
PROGRESS NOTE  Courtney Mcconnell ENM:[id number] DOB: [birth year] DOA: 01/17/2020 PCP: Dettinger, Fransisca Kaufmann, MD  Brief History:  64 year old female with a history of coronary artery disease/NSTEMI, seizure, OSA, COPD, hyperlipidemia, tobacco abuse in remission, hypertension presenting with 1 week history of chest congestion, cough, and myalgias.  The patient also had some associated chest discomfort and shortness of breath.  There is no hemoptysis.  On the day prior to admission, the patient also had some episodes of nausea and vomiting.  She also had some loose stools.  Since admission to the hospital, her nausea and vomiting have improved.  The daughter states that the patient has been increasingly somnolent with symptoms of polydipsia. In the emergency department, the patient had a fever up to 100.6 F.  She was hemodynamically stable.  Oxygen saturation was 90% on room air.  BMP showed a potassium of 3.3 with serum creatinine 0.79.  AST 56, ALT 34, alk phosphatase 72, total bilirubin 0.4.  WBC 5.2, hemoglobin 12.2, platelets 218,000.  COVID-19 RT-PCR was positive.  The patient was started on dexamethasone and remdesivir.  Assessment/Plan: Acute respiratory failure with hypoxia secondary COVID-19 -Currently stable on 2 L nasal cannula -Wean oxygen as tolerated -CRP 13.5>> 12.4 -Ferritin 369>> 400 -D-dimer 2.77> 2.60 -Continue IV remdesivir and steroids  Essential hypertension -Continue carvedilol -Holding losartan  Coronary artery disease/history of NSTEMI -Continue aspirin and Brilinta -No chest pain presently -Personally reviewed EKG sinus rhythm, no concerning ST or T wave changes  COPD -Continue Breo -Albuterol as needed  Hyperlipidemia -Continue statin  Hypokalemia -Replete -Check magnesium  Seizure disorder -Continue home doses of Vimpat and Zonegran  Morbid obesity -BMI 37.75 -Lifestyle modification      Status is: Inpatient  Remains inpatient  appropriate because:IV treatments appropriate due to intensity of illness or inability to take PO   Dispo: The patient is from: Home              Anticipated d/c is to: Home              Anticipated d/c date is: 1 day              Patient currently is not medically stable to d/c.        Family Communication:   Daughter updated 12/6  Consultants: none   Code Status:  FULL  DVT Prophylaxis:   [place] Lovenox   Procedures: As Listed in Progress Note Above  Antibiotics: None     Subjective: Patient denies fevers, chills, headache, chest pain, dyspnea, nausea, vomiting, diarrhea, abdominal pain, dysuria, hematuria, hematochezia, and melena.   Objective: Vitals:   01/17/20 2200 01/18/20 0505 01/18/20 0901 01/18/20 1309  BP: (!) 141/74 (!) 144/60  122/66  Pulse: 60 (!) 55  66  Resp: 16 18  (!) 22  Temp: 97.8 F (36.6 C) (!) 96.9 F (36.1 C)  98.3 F (36.8 C)  TempSrc: Axillary Axillary  Oral  SpO2: 96% 97% 91% 92%  Weight:      Height:       No intake or output data in the 24 hours ending 01/18/20 1616 Weight change:  Exam:   General:  Pt is alert, follows commands appropriately, not in acute distress  HEENT: No icterus, No thrush, No neck mass, [place]/AT  Cardiovascular: RRR, S1/S2, no rubs, no gallops  Respiratory: bibasilar rales. No wheeze  Abdomen: Soft/+BS, non tender, non distended, no guarding  Extremities: No edema, No lymphangitis,  No petechiae, No rashes, no synovitis   Data Reviewed: I have personally reviewed following labs and imaging studies Basic Metabolic Panel: Recent Labs  Lab 01/17/20 1420 01/18/20 0631  NA 133* 137  K 3.0* 3.3*  CL 101 106  CO2 22 21*  GLUCOSE 152* 186*  BUN 13 17  CREATININE 0.88 0.79  CALCIUM 8.0* 8.1*  MG  --  2.6*   Liver Function Tests: Recent Labs  Lab 01/17/20 2107 01/18/20 0631  AST 64* 56*  ALT 36 34  ALKPHOS 79 72  BILITOT 0.6 0.4  PROT 6.7 6.4*  ALBUMIN 2.8* 2.6*   No results for  input(s): LIPASE, AMYLASE in the last 168 hours. No results for input(s): AMMONIA in the last 168 hours. Coagulation Profile: No results for input(s): INR, PROTIME in the last 168 hours. CBC: Recent Labs  Lab 01/17/20 1420 01/18/20 0631  WBC 5.2 3.0*  NEUTROABS 4.4 2.3  HGB 12.2 12.7  HCT 37.2 38.6  MCV 95.9 96.3  PLT 218 218   Cardiac Enzymes: No results for input(s): CKTOTAL, CKMB, CKMBINDEX, TROPONINI in the last 168 hours. BNP: Invalid input(s): POCBNP CBG: Recent Labs  Lab 01/17/20 2036 01/18/20 0746 01/18/20 1200  GLUCAP 180* 170* 216*   HbA1C: No results for input(s): HGBA1C in the last 72 hours. Urine analysis:    Component Value Date/Time   COLORURINE STRAW (A) 05/26/2016 0008   APPEARANCEUR CLEAR 05/26/2016 0008   LABSPEC 1.004 (L) 05/26/2016 0008   PHURINE 7.0 05/26/2016 0008   GLUCOSEU NEGATIVE 05/26/2016 0008   HGBUR NEGATIVE 05/26/2016 0008   BILIRUBINUR NEGATIVE 05/26/2016 0008   KETONESUR NEGATIVE 05/26/2016 0008   PROTEINUR NEGATIVE 05/26/2016 0008   UROBILINOGEN 0.2 04/18/2014 1712   NITRITE NEGATIVE 05/26/2016 0008   LEUKOCYTESUR NEGATIVE 05/26/2016 0008   Sepsis Labs: '@LABRCNTIP' (procalcitonin:4,lacticidven:4) ) Recent Results (from the past 240 hour(s))  Resp Panel by RT-PCR (Flu A&B, Covid) Nasopharyngeal Swab     Status: Abnormal   Collection Time: 01/17/20 12:52 PM   Specimen: Nasopharyngeal Swab; Nasopharyngeal(NP) swabs in vial transport medium  Result Value Ref Range Status   SARS Coronavirus 2 by RT PCR POSITIVE (A) NEGATIVE Final    Comment: RESULT CALLED TO, READ BACK BY AND VERIFIED WITH: ANNE TUTTLE,RN  '@1409'  01/17/2020 KAY (NOTE) SARS-CoV-2 target nucleic acids are DETECTED.  The SARS-CoV-2 RNA is generally detectable in upper respiratory specimens during the acute phase of infection. Positive results are indicative of the presence of the identified virus, but do not rule out bacterial infection or co-infection with other  pathogens not detected by the test. Clinical correlation with patient history and other diagnostic information is necessary to determine patient infection status. The expected result is Negative.  Fact Sheet for Patients: EntrepreneurPulse.com.au  Fact Sheet for Healthcare Providers: IncredibleEmployment.be  This test is not yet approved or cleared by the [place] FDA and  has been authorized for detection and/or diagnosis of SARS-CoV-2 by FDA under an Emergency Use Authorization (EUA).  This EUA will remain in effect (meaning this test can  be used) for the duration of  the COVID-19 declaration under Section 564(b)(1) of the Act, 21 U.S.C. section 360bbb-3(b)(1), unless the authorization is terminated or revoked sooner.     Influenza A by PCR NEGATIVE NEGATIVE Final   Influenza B by PCR NEGATIVE NEGATIVE Final    Comment: (NOTE) The Xpert Xpress SARS-CoV-2/FLU/RSV plus assay is intended as an aid in the diagnosis of influenza from Nasopharyngeal swab specimens and should not be  used as a sole basis for treatment. Nasal washings and aspirates are unacceptable for Xpert Xpress SARS-CoV-2/FLU/RSV testing.  Fact Sheet for Patients: EntrepreneurPulse.com.au  Fact Sheet for Healthcare Providers: IncredibleEmployment.be  This test is not yet approved or cleared by the [place] FDA and has been authorized for detection and/or diagnosis of SARS-CoV-2 by FDA under an Emergency Use Authorization (EUA). This EUA will remain in effect (meaning this test can be used) for the duration of the COVID-19 declaration under Section 564(b)(1) of the Act, 21 U.S.C. section 360bbb-3(b)(1), unless the authorization is terminated or revoked.  Performed at [facility], [address], [place], [place] [address]   Blood Culture (routine x 2)     Status: None (Preliminary result)   Collection Time: 01/17/20  2:20 PM    Specimen: Left Antecubital; Blood  Result Value Ref Range Status   Specimen Description LEFT ANTECUBITAL  Final   Special Requests   Final    BOTTLES DRAWN AEROBIC AND ANAEROBIC Blood Culture results may not be optimal due to an excessive volume of blood received in culture bottles   Culture   Final    NO GROWTH < 24 HOURS Performed at [facility], [address], [place], [place] [address]    Report Status PENDING  Incomplete  Blood Culture (routine x 2)     Status: None (Preliminary result)   Collection Time: 01/17/20  9:07 PM   Specimen: Right Antecubital; Blood  Result Value Ref Range Status   Specimen Description RIGHT ANTECUBITAL  Final   Special Requests   Final    BOTTLES DRAWN AEROBIC AND ANAEROBIC Blood Culture adequate volume   Culture   Final    NO GROWTH < 12 HOURS Performed at [facility], [address], [place], [place] [address]    Report Status PENDING  Incomplete     Scheduled Meds: . AeroChamber Plus Flo-Vu Medium  1 each Other Once  . vitamin C  500 mg Oral Daily  . aspirin EC  81 mg Oral Daily  . atorvastatin  40 mg Oral Daily  . carvedilol  3.125 mg Oral BID WC  . dexamethasone (DECADRON) injection  6 mg Intravenous Q24H  . enoxaparin (LOVENOX) injection  40 mg Subcutaneous Q24H  . fluticasone furoate-vilanterol  1 puff Inhalation Daily  . insulin aspart  0-15 Units Subcutaneous TID WC  . insulin aspart  0-5 Units Subcutaneous QHS  . insulin detemir  0.075 Units/kg Subcutaneous BID  . lacosamide  150 mg Oral BID  . pantoprazole  40 mg Oral Daily  . ticagrelor  90 mg Oral BID  . zinc sulfate  220 mg Oral Daily  . zonisamide  200 mg Oral QHS   Continuous Infusions: . remdesivir 100 mg in NS 100 mL 100 mg (01/18/20 0921)    Procedures/Studies: DG Chest Portable 1 View  Result Date: 01/17/2020 CLINICAL DATA:  Shortness of breath EXAM: PORTABLE CHEST 1 VIEW COMPARISON:  02/10/2019 FINDINGS: 1332 hours. The cardio pericardial silhouette is enlarged.  There is pulmonary vascular congestion without overt pulmonary edema. Patchy peripherally oriented ground-glass airspace disease noted bilaterally with more confluent consolidative opacity at the bases. Telemetry leads overlie the chest. IMPRESSION: 1. Cardiac enlargement with pulmonary vascular congestion. 2. Patchy peripherally oriented ground-glass airspace disease with more confluent consolidative opacity at the bases. Imaging features are compatible with multifocal pneumonia. Electronically Signed   By: Misty Stanley M.D.   On: 01/17/2020 14:19    Orson Eva, DO  [facility]  If 7PM-7AM, please contact night-coverage [email or web address] Password [email or web address] 01/18/2020, 4:16 PM   LOS: 1 day

## 2020-01-19 LAB — C-REACTIVE PROTEIN: CRP: 6 mg/dL — ABNORMAL HIGH (ref <1.0)

## 2020-01-19 LAB — CBC WITH DIFFERENTIAL/PLATELET
Abs Immature Granulocytes: 0.04 10*3/uL (ref 0.00–0.07)
Basophils Absolute: 0 10*3/uL (ref 0.0–0.1)
Basophils Relative: 0 %
Eosinophils Absolute: 0 10*3/uL (ref 0.0–0.5)
Eosinophils Relative: 0 %
HCT: 37.1 % (ref 36.0–46.0)
Hemoglobin: 12.1 g/dL (ref 12.0–15.0)
Immature Granulocytes: 1 %
Lymphocytes Relative: 6 %
Lymphs Abs: 0.4 10*3/uL — ABNORMAL LOW (ref 0.7–4.0)
MCH: 31.2 pg (ref 26.0–34.0)
MCHC: 32.6 g/dL (ref 30.0–36.0)
MCV: 95.6 fL (ref 80.0–100.0)
Monocytes Absolute: 0.5 10*3/uL (ref 0.1–1.0)
Monocytes Relative: 8 %
Neutro Abs: 5.9 10*3/uL (ref 1.7–7.7)
Neutrophils Relative %: 85 %
Platelets: 260 10*3/uL (ref 150–400)
RBC: 3.88 MIL/uL (ref 3.87–5.11)
RDW: 13.7 % (ref 11.5–15.5)
WBC: 6.9 10*3/uL (ref 4.0–10.5)
nRBC: 0 % (ref 0.0–0.2)

## 2020-01-19 LAB — COMPREHENSIVE METABOLIC PANEL
ALT: 31 U/L (ref 0–44)
AST: 53 U/L — ABNORMAL HIGH (ref 15–41)
Albumin: 2.6 g/dL — ABNORMAL LOW (ref 3.5–5.0)
Alkaline Phosphatase: 67 U/L (ref 38–126)
Anion gap: 9 (ref 5–15)
BUN: 25 mg/dL — ABNORMAL HIGH (ref 8–23)
CO2: 22 mmol/L (ref 22–32)
Calcium: 8.1 mg/dL — ABNORMAL LOW (ref 8.9–10.3)
Chloride: 104 mmol/L (ref 98–111)
Creatinine, Ser: 0.92 mg/dL (ref 0.44–1.00)
GFR, Estimated: >60 mL/min (ref >60)
Glucose, Bld: 184 mg/dL — ABNORMAL HIGH (ref 70–99)
Potassium: 3.5 mmol/L (ref 3.5–5.1)
Sodium: 135 mmol/L (ref 135–145)
Total Bilirubin: 0.5 mg/dL (ref 0.3–1.2)
Total Protein: 5.9 g/dL — ABNORMAL LOW (ref 6.5–8.1)

## 2020-01-19 LAB — HEMOGLOBIN A1C
Hgb A1c MFr Bld: 7.4 % — ABNORMAL HIGH (ref 4.8–5.6)
Mean Plasma Glucose: 165.68 mg/dL

## 2020-01-19 LAB — GLUCOSE, CAPILLARY
Glucose-Capillary: 167 mg/dL — ABNORMAL HIGH (ref 70–99)
Glucose-Capillary: 179 mg/dL — ABNORMAL HIGH (ref 70–99)

## 2020-01-19 LAB — D-DIMER, QUANTITATIVE: D-Dimer, Quant: 1.88 ug/mL-FEU — ABNORMAL HIGH (ref 0.00–0.50)

## 2020-01-19 LAB — MAGNESIUM: Magnesium: 2.5 mg/dL — ABNORMAL HIGH (ref 1.7–2.4)

## 2020-01-19 LAB — FERRITIN: Ferritin: 380 ng/mL — ABNORMAL HIGH (ref 11–307)

## 2020-01-19 MED ORDER — DEXAMETHASONE 6 MG PO TABS: 6.0000 mg | ORAL_TABLET | Freq: Every day | ORAL | 0 refills | Status: DC

## 2020-01-19 NOTE — Progress Notes (Signed)
Patient scheduled for outpatient Remdesivir infusions at 12pm on Wednesday 12/8 and Thursday 12/9 at [facility]. Please inform the patient to park at [place], as staff will be escorting the patient through the [place] entrance of the hospital. Appointments take approximately 45 minutes.    There is a wave flag banner located near the entrance on [address]. Turn into this entrance and immediately turn left or right and park in 1 of the 10 designated Covid Infusion Parking spots. There is a phone number on the sign, please call and let the staff know what spot you are in and we will come out and get you. For questions call [phone].  Thanks.

## 2020-01-19 NOTE — Discharge Summary (Addendum)
Physician Discharge Summary  CAYCEE WANAT UJW:[id number] DOB: 1955/11/03 DOA: 01/17/2020  PCP: Dettinger, Fransisca Kaufmann, MD  Admit date: 01/17/2020 Discharge date: 01/19/2020  Admitted From: Home Disposition:  Home   Recommendations for Outpatient Follow-up:  1. Follow up with PCP in 1-2 weeks 2. Please obtain BMP/CBC in one week   Home Health: YES Equipment/Devices: HHPT  Discharge Condition: Stable CODE STATUS: FULL Diet recommendation: Heart Healthy / Carb Modified   Brief/Interim Summary: 64 year old female with a history of coronary artery disease/NSTEMI, seizure, OSA, COPD, hyperlipidemia, tobacco abuse in remission, hypertension presenting with 1 week history of chest congestion, cough, and myalgias.  The patient also had some associated chest discomfort and shortness of breath.  There is no hemoptysis.  On the day prior to admission, the patient also had some episodes of nausea and vomiting.  She also had some loose stools.  Since admission to the hospital, her nausea and vomiting have improved.  The daughter states that the patient has been increasingly somnolent with symptoms of polydipsia. In the emergency department, the patient had a fever up to 100.6 F.  She was hemodynamically stable.  Oxygen saturation was 90% on room air.  BMP showed a potassium of 3.3 with serum creatinine 0.79.  AST 56, ALT 34, alk phosphatase 72, total bilirubin 0.4.  WBC 5.2, hemoglobin 12.2, platelets 218,000.  COVID-19 RT-PCR was positive.  The patient was started on dexamethasone and remdesivir.  Discharge Diagnoses:  Acute respiratory failure with hypoxia secondary COVID-19 -Currently stable on 2 L nasal cannula>>RA -Wean oxygen as tolerated -CRP 13.5>> 12.4>>6.0 -Ferritin 369>> 400>>380 -D-dimer 2.77> 2.60>>1.88 -Continue IV remdesivir and steroids -outpatient remdesivir infusion set up for 12/8 and 12/9 -d/c home with 7 more days of dexamethasone  Essential hypertension -Continue  carvedilol -Holding losartan--restart after d/c  Coronary artery disease/history of NSTEMI -Continue aspirin and Brilinta -No chest pain presently -Personally reviewed EKG sinus rhythm, no concerning ST or T wave changes  COPD -Continue Breo -Albuterol as needed  Hyperlipidemia -Continue statin  Hypokalemia -Replete -Check magnesium--2.5  Seizure disorder -Continue home doses of Vimpat and Zonegran  Morbid obesity -BMI 37.75 -Lifestyle modification  Impaired Glucose Tolerance-->Diabetes Mellitus type 2 -02/20/19 A1C--5.9 -repeat A1C--7.4 on 01/19/20 -will need to follow up with PCP to initiate meds vs lifestyle modification     Discharge Instructions   Allergies as of 01/19/2020      Reactions   Penicillins Hives   Bee Venom Swelling   Amoxicillin-pot Clavulanate Rash   Doxycycline Hyclate Rash   Rash and itching      Medication List    STOP taking these medications   cefdinir 300 MG capsule Commonly known as: OMNICEF   ibuprofen 600 MG tablet Commonly known as: ADVIL   lidocaine 5 % ointment Commonly known as: XYLOCAINE   predniSONE 20 MG tablet Commonly known as: DELTASONE     TAKE these medications   albuterol 108 (90 Base) MCG/ACT inhaler Commonly known as: VENTOLIN HFA INHALE TWO PUFFS INTO THE LUNGS EVERY 6 HOURS AS NEEDED FOR WHEEZING OR SHORTNESS OF BREATH What changed:   how much to take  how to take this  when to take this  reasons to take this  additional instructions   aspirin 81 MG EC tablet Take 1 tablet (81 mg total) by mouth daily.   atorvastatin 40 MG tablet Commonly known as: LIPITOR TAKE 1 TABLET BY MOUTH DAILY AT 6PM   Brilinta 90 MG Tabs tablet Generic drug: ticagrelor TAKE 1 TABLET  BY MOUTH TWICE DAILY   carvedilol 3.125 MG tablet Commonly known as: COREG TAKE 1 TABLET BY MOUTH TWICE DAILY WITH A MEAL What changed: See the new instructions.   dexamethasone 6 MG tablet Commonly known as:  DECADRON Take 1 tablet (6 mg total) by mouth daily.   Fluticasone-Salmeterol 100-50 MCG/DOSE Aepb Commonly known as: Advair Diskus Inhale 1 puff into the lungs in the morning and at bedtime.   losartan 25 MG tablet Commonly known as: COZAAR Take 1 tablet (25 mg total) by mouth daily.   nitroGLYCERIN 0.4 MG SL tablet Commonly known as: NITROSTAT Place 1 tablet (0.4 mg total) under the tongue every 5 (five) minutes as needed for chest pain.   omeprazole 20 MG tablet Commonly known as: PRILOSEC OTC Take 20 mg by mouth daily.   Spiriva HandiHaler 18 MCG inhalation capsule Generic drug: tiotropium place ONE CAPSULE into inhaler AND INHALE DAILY What changed: See the new instructions.   Vimpat 150 MG Tabs Generic drug: Lacosamide Take 2 tablets by mouth 2 (two) times daily.   zonisamide 100 MG capsule Commonly known as: ZONEGRAN Take 200 mg by mouth at bedtime.       Allergies  Allergen Reactions  . Penicillins Hives  . Bee Venom Swelling  . Amoxicillin-Pot Clavulanate Rash  . Doxycycline Hyclate Rash    Rash and itching    Consultations:  none   Procedures/Studies: DG Chest Portable 1 View  Result Date: 01/17/2020 CLINICAL DATA:  Shortness of breath EXAM: PORTABLE CHEST 1 VIEW COMPARISON:  02/10/2019 FINDINGS: 1332 hours. The cardio pericardial silhouette is enlarged. There is pulmonary vascular congestion without overt pulmonary edema. Patchy peripherally oriented ground-glass airspace disease noted bilaterally with more confluent consolidative opacity at the bases. Telemetry leads overlie the chest. IMPRESSION: 1. Cardiac enlargement with pulmonary vascular congestion. 2. Patchy peripherally oriented ground-glass airspace disease with more confluent consolidative opacity at the bases. Imaging features are compatible with multifocal pneumonia. Electronically Signed   By: Misty Stanley M.D.   On: 01/17/2020 14:19        Discharge Exam: Vitals:   01/19/20 0608  01/19/20 0815  BP: 126/67   Pulse: 61   Resp: 18   Temp: 97.9 F (36.6 C)   SpO2: 98% 97%   Vitals:   01/18/20 1309 01/18/20 2144 01/19/20 0608 01/19/20 0815  BP: 122/66 123/71 126/67   Pulse: 66 65 61   Resp: (!) 22 (!) 21 18   Temp: 98.3 F (36.8 C) 98.2 F (36.8 C) 97.9 F (36.6 C)   TempSrc: Oral Oral Oral   SpO2: 92% 93% 98% 97%  Weight:      Height:        General: Pt is alert, awake, not in acute distress Cardiovascular: RRR, S1/S2 +, no rubs, no gallops Respiratory: bibasilar rales. No wheeze Abdominal: Soft, NT, ND, bowel sounds + Extremities: no edema, no cyanosis   The results of significant diagnostics from this hospitalization (including imaging, microbiology, ancillary and laboratory) are listed below for reference.    Significant Diagnostic Studies: DG Chest Portable 1 View  Result Date: 01/17/2020 CLINICAL DATA:  Shortness of breath EXAM: PORTABLE CHEST 1 VIEW COMPARISON:  02/10/2019 FINDINGS: 1332 hours. The cardio pericardial silhouette is enlarged. There is pulmonary vascular congestion without overt pulmonary edema. Patchy peripherally oriented ground-glass airspace disease noted bilaterally with more confluent consolidative opacity at the bases. Telemetry leads overlie the chest. IMPRESSION: 1. Cardiac enlargement with pulmonary vascular congestion. 2. Patchy peripherally oriented ground-glass airspace  disease with more confluent consolidative opacity at the bases. Imaging features are compatible with multifocal pneumonia. Electronically Signed   By: Misty Stanley M.D.   On: 01/17/2020 14:19     Microbiology: Recent Results (from the past 240 hour(s))  Resp Panel by RT-PCR (Flu A&B, Covid) Nasopharyngeal Swab     Status: Abnormal   Collection Time: 01/17/20 12:52 PM   Specimen: Nasopharyngeal Swab; Nasopharyngeal(NP) swabs in vial transport medium  Result Value Ref Range Status   SARS Coronavirus 2 by RT PCR POSITIVE (A) NEGATIVE Final    Comment:  RESULT CALLED TO, READ BACK BY AND VERIFIED WITH: ANNE TUTTLE,RN  '@1409'  01/17/2020 KAY (NOTE) SARS-CoV-2 target nucleic acids are DETECTED.  The SARS-CoV-2 RNA is generally detectable in upper respiratory specimens during the acute phase of infection. Positive results are indicative of the presence of the identified virus, but do not rule out bacterial infection or co-infection with other pathogens not detected by the test. Clinical correlation with patient history and other diagnostic information is necessary to determine patient infection status. The expected result is Negative.  Fact Sheet for Patients: EntrepreneurPulse.com.au  Fact Sheet for Healthcare Providers: IncredibleEmployment.be  This test is not yet approved or cleared by the [place] FDA and  has been authorized for detection and/or diagnosis of SARS-CoV-2 by FDA under an Emergency Use Authorization (EUA).  This EUA will remain in effect (meaning this test can  be used) for the duration of  the COVID-19 declaration under Section 564(b)(1) of the Act, 21 U.S.C. section 360bbb-3(b)(1), unless the authorization is terminated or revoked sooner.     Influenza A by PCR NEGATIVE NEGATIVE Final   Influenza B by PCR NEGATIVE NEGATIVE Final    Comment: (NOTE) The Xpert Xpress SARS-CoV-2/FLU/RSV plus assay is intended as an aid in the diagnosis of influenza from Nasopharyngeal swab specimens and should not be used as a sole basis for treatment. Nasal washings and aspirates are unacceptable for Xpert Xpress SARS-CoV-2/FLU/RSV testing.  Fact Sheet for Patients: EntrepreneurPulse.com.au  Fact Sheet for Healthcare Providers: IncredibleEmployment.be  This test is not yet approved or cleared by the [place] FDA and has been authorized for detection and/or diagnosis of SARS-CoV-2 by FDA under an Emergency Use Authorization (EUA). This EUA will  remain in effect (meaning this test can be used) for the duration of the COVID-19 declaration under Section 564(b)(1) of the Act, 21 U.S.C. section 360bbb-3(b)(1), unless the authorization is terminated or revoked.  Performed at [facility], [address], [place], [place] [address]   Blood Culture (routine x 2)     Status: None (Preliminary result)   Collection Time: 01/17/20  2:20 PM   Specimen: Left Antecubital; Blood  Result Value Ref Range Status   Specimen Description LEFT ANTECUBITAL  Final   Special Requests   Final    BOTTLES DRAWN AEROBIC AND ANAEROBIC Blood Culture results may not be optimal due to an excessive volume of blood received in culture bottles   Culture   Final    NO GROWTH < 24 HOURS Performed at [facility], [address], [place], [place] [address]    Report Status PENDING  Incomplete  Blood Culture (routine x 2)     Status: None (Preliminary result)   Collection Time: 01/17/20  9:07 PM   Specimen: Right Antecubital; Blood  Result Value Ref Range Status   Specimen Description RIGHT ANTECUBITAL  Final   Special Requests   Final    BOTTLES DRAWN AEROBIC AND ANAEROBIC  Blood Culture adequate volume   Culture   Final    NO GROWTH < 12 HOURS Performed at [facility], [address] [place], [place] [address]    Report Status PENDING  Incomplete     Labs: Basic Metabolic Panel: Recent Labs  Lab 01/17/20 1420 01/17/20 1420 01/18/20 0631 01/19/20 0507  NA 133*  --  137 135  K 3.0*   < > 3.3* 3.5  CL 101  --  106 104  CO2 22  --  21* 22  GLUCOSE 152*  --  186* 184*  BUN 13  --  17 25*  CREATININE 0.88  --  0.79 0.92  CALCIUM 8.0*  --  8.1* 8.1*  MG  --   --  2.6* 2.5*   < > = values in this interval not displayed.   Liver Function Tests: Recent Labs  Lab 01/17/20 2107 01/18/20 0631 01/19/20 0507  AST 64* 56* 53*  ALT 36 34 31  ALKPHOS 79 72 67  BILITOT 0.6 0.4 0.5  PROT 6.7 6.4* 5.9*  ALBUMIN 2.8* 2.6* 2.6*   No results for input(s):  LIPASE, AMYLASE in the last 168 hours. No results for input(s): AMMONIA in the last 168 hours. CBC: Recent Labs  Lab 01/17/20 1420 01/18/20 0631 01/19/20 0507  WBC 5.2 3.0* 6.9  NEUTROABS 4.4 2.3 5.9  HGB 12.2 12.7 12.1  HCT 37.2 38.6 37.1  MCV 95.9 96.3 95.6  PLT 218 218 260   Cardiac Enzymes: No results for input(s): CKTOTAL, CKMB, CKMBINDEX, TROPONINI in the last 168 hours. BNP: Invalid input(s): POCBNP CBG: Recent Labs  Lab 01/18/20 0746 01/18/20 1200 01/18/20 1704 01/18/20 2142 01/19/20 0742  GLUCAP 170* 216* 184* 163* 167*    Time coordinating discharge:  36 minutes  Signed:  Orson Eva, DO [facility] Pager: [phone] 01/19/2020, 10:18 AM

## 2020-01-19 NOTE — Plan of Care (Signed)
  Problem: Activity: Goal: Ability to tolerate increased activity will improve Outcome: Progressing   Problem: Clinical Measurements: Goal: Ability to maintain a body temperature in the normal range will improve Outcome: Progressing   Problem: Respiratory: Goal: Ability to maintain adequate ventilation will improve Outcome: Progressing Goal: Ability to maintain a clear airway will improve Outcome: Progressing   

## 2020-01-19 NOTE — Discharge Instructions (Signed)
You are scheduled for an outpatient Remdesivir infusion at 12pm on Wednesday 12/8 and Thursday 12/9 at [facility]. Please park at [place], as staff will be escorting you through the [place] entrance of the hospital. Appointments take approximately 45 minutes.    The address for the infusion clinic site is:  --GPS address is [place] - the parking is located near [employer] building where you will see  COVID19 Infusion feather banner marking the entrance to parking.   (see photos below)            --Enter into the 2nd entrance where the "wave, flag banner" is at the road. Turn into this 2nd entrance and immediately turn left to park in 1 of the 5 parking spots.   --Please stay in your car and call the desk for assistance inside [phone].   The day of your visit you should: Marland Kitchen Get plenty of rest the night before and drink plenty of water . Eat a light meal/snack before coming and take your medications as prescribed  . Wear warm, comfortable clothes with a shirt that can roll-up over the elbow (will need IV start).  . Wear a mask  . Consider bringing some activity to help pass the time

## 2020-01-19 NOTE — Plan of Care (Signed)
  Problem: Activity: Goal: Ability to tolerate increased activity will improve 01/19/2020 1103 by Melony Overly, RN Outcome: Adequate for Discharge 01/19/2020 1018 by Melony Overly, RN Outcome: Progressing   Problem: Clinical Measurements: Goal: Ability to maintain a body temperature in the normal range will improve 01/19/2020 1103 by Melony Overly, RN Outcome: Adequate for Discharge 01/19/2020 1018 by Melony Overly, RN Outcome: Progressing   Problem: Respiratory: Goal: Ability to maintain adequate ventilation will improve 01/19/2020 1103 by Melony Overly, RN Outcome: Adequate for Discharge 01/19/2020 1018 by Melony Overly, RN Outcome: Progressing Goal: Ability to maintain a clear airway will improve 01/19/2020 1103 by Melony Overly, RN Outcome: Adequate for Discharge 01/19/2020 1018 by Melony Overly, RN Outcome: Progressing

## 2020-01-19 NOTE — Care Management Important Message (Signed)
Important Message  Patient Details  Name: Courtney Mcconnell MRN: [id number] Date of Birth: 04-11-1955   Medicare Important Message Given:  Yes     Tommy Medal 01/19/2020, 2:46 PM

## 2020-01-19 NOTE — Care Management Important Message (Signed)
Important Message  Patient Details  Name: Courtney Mcconnell MRN: [id number] Date of Birth: Jan 02, 1956   Medicare Important Message Given:  Yes - Important Message mailed due to current National Emergency     Tommy Medal 01/19/2020, 2:55 PM

## 2020-01-20 ENCOUNTER — Ambulatory Visit (HOSPITAL_COMMUNITY)
Admit: 2020-01-20 | Discharge: 2020-01-20 | Disposition: A | Discharge status: Home / Self Care | Payer: Medicare Other | Source: Ambulatory Visit | Attending: Pulmonary Disease | Admitting: Pulmonary Disease

## 2020-01-20 ENCOUNTER — Telehealth: Payer: Self-pay | Admitting: *Deleted

## 2020-01-20 DIAGNOSIS — U071 COVID-19: Secondary | ICD-10-CM | POA: Insufficient documentation

## 2020-01-20 DIAGNOSIS — J1289 Other viral pneumonia: Secondary | ICD-10-CM | POA: Insufficient documentation

## 2020-01-20 MED ORDER — DIPHENHYDRAMINE HCL 50 MG/ML IJ SOLN: 50.0000 mg | Freq: Once | INTRAMUSCULAR | Status: DC | PRN

## 2020-01-20 MED ORDER — EPINEPHRINE 0.3 MG/0.3ML IJ SOAJ: 0.3000 mg | Freq: Once | INTRAMUSCULAR | Status: DC | PRN

## 2020-01-20 MED ORDER — FAMOTIDINE IN NACL 20-0.9 MG/50ML-% IV SOLN: 20.0000 mg | Freq: Once | INTRAVENOUS | Status: DC | PRN

## 2020-01-20 MED ORDER — METHYLPREDNISOLONE SODIUM SUCC 125 MG IJ SOLR: 125.0000 mg | Freq: Once | INTRAMUSCULAR | Status: DC | PRN

## 2020-01-20 MED ORDER — SODIUM CHLORIDE 0.9 % IV SOLN: INTRAVENOUS | Status: DC | PRN

## 2020-01-20 MED ORDER — SODIUM CHLORIDE 0.9 % IV SOLN
100.0000 mg | Freq: Once | INTRAVENOUS | Status: AC
  Administered 2020-01-20: 100 mg via INTRAVENOUS

## 2020-01-20 MED ORDER — ALBUTEROL SULFATE HFA 108 (90 BASE) MCG/ACT IN AERS: 2.0000 | INHALATION_SPRAY | Freq: Once | RESPIRATORY_TRACT | Status: DC | PRN

## 2020-01-20 NOTE — Telephone Encounter (Signed)
Transition Care Management Unsuccessful Follow-up Telephone Call  Date of discharge and from where:  01/19/20  Attempts:  1st Attempt  Reason for unsuccessful TCM follow-up call:  Left voice message

## 2020-01-20 NOTE — Progress Notes (Signed)
  Diagnosis: COVID-19  Physician: Dr. Joya Gaskins  Procedure: Covid Infusion Clinic Med: remdesivir infusion - Provided patient with remdesivir fact sheet for patients, parents and caregivers prior to infusion.  Complications: No immediate complications noted.  Discharge: Discharged home   Courtney Mcconnell 01/20/2020

## 2020-01-21 ENCOUNTER — Ambulatory Visit (HOSPITAL_COMMUNITY)
Admit: 2020-01-21 | Discharge: 2020-01-21 | Disposition: A | Discharge status: Home / Self Care | Payer: Medicare Other | Source: Ambulatory Visit | Attending: Pulmonary Disease | Admitting: Pulmonary Disease

## 2020-01-21 DIAGNOSIS — U071 COVID-19: Secondary | ICD-10-CM | POA: Diagnosis not present

## 2020-01-21 DIAGNOSIS — J1289 Other viral pneumonia: Secondary | ICD-10-CM | POA: Insufficient documentation

## 2020-01-21 MED ORDER — ALBUTEROL SULFATE HFA 108 (90 BASE) MCG/ACT IN AERS: 2.0000 | INHALATION_SPRAY | Freq: Once | RESPIRATORY_TRACT | Status: DC | PRN

## 2020-01-21 MED ORDER — FAMOTIDINE IN NACL 20-0.9 MG/50ML-% IV SOLN: 20.0000 mg | Freq: Once | INTRAVENOUS | Status: DC | PRN

## 2020-01-21 MED ORDER — METHYLPREDNISOLONE SODIUM SUCC 125 MG IJ SOLR: 125.0000 mg | Freq: Once | INTRAMUSCULAR | Status: DC | PRN

## 2020-01-21 MED ORDER — SODIUM CHLORIDE 0.9 % IV SOLN
100.0000 mg | Freq: Once | INTRAVENOUS | Status: AC
  Administered 2020-01-21: 100 mg via INTRAVENOUS

## 2020-01-21 MED ORDER — SODIUM CHLORIDE 0.9 % IV SOLN: INTRAVENOUS | Status: DC | PRN

## 2020-01-21 MED ORDER — EPINEPHRINE 0.3 MG/0.3ML IJ SOAJ: 0.3000 mg | Freq: Once | INTRAMUSCULAR | Status: DC | PRN

## 2020-01-21 MED ORDER — DIPHENHYDRAMINE HCL 50 MG/ML IJ SOLN: 50.0000 mg | Freq: Once | INTRAMUSCULAR | Status: DC | PRN

## 2020-01-21 NOTE — Progress Notes (Signed)
Patient reviewed Fact Sheet for Patients, Parents, and Caregivers for Emergency Use Authorization (EUA) of remdesivir for the Treatment of Coronavirus. Patient also reviewed and is agreeable to the estimated cost of treatment. Patient is agreeable to proceed.    

## 2020-01-21 NOTE — Telephone Encounter (Signed)
Transition Care Management Unsuccessful Follow-up Telephone Call  Date of discharge and from where:  01/19/2020 from [facility]  Attempts:  2nd Attempt  Reason for unsuccessful TCM follow-up call:  Left voice message

## 2020-01-21 NOTE — Discharge Instructions (Signed)
10 Things You Can Do to Manage Your COVID-19 Symptoms at Home If you have possible or confirmed COVID-19: 1. Stay home from work and school. And stay away from other public places. If you must go out, avoid using any kind of public transportation, ridesharing, or taxis. 2. Monitor your symptoms carefully. If your symptoms get worse, call your healthcare provider immediately. 3. Get rest and stay hydrated. 4. If you have a medical appointment, call the healthcare provider ahead of time and tell them that you have or may have COVID-19. 5. For medical emergencies, call 911 and notify the dispatch personnel that you have or may have COVID-19. 6. Cover your cough and sneezes with a tissue or use the inside of your elbow. 7. Wash your hands often with soap and water for at least 20 seconds or clean your hands with an alcohol-based hand sanitizer that contains at least 60% alcohol. 8. As much as possible, stay in a specific room and away from other people in your home. Also, you should use a separate bathroom, if available. If you need to be around other people in or outside of the home, wear a mask. 9. Avoid sharing personal items with other people in your household, like dishes, towels, and bedding. 10. Clean all surfaces that are touched often, like counters, tabletops, and doorknobs. Use household cleaning sprays or wipes according to the label instructions. cdc.gov/coronavirus 08/13/2018 This information is not intended to replace advice given to you by your health care provider. Make sure you discuss any questions you have with your health care provider. Document Revised: 01/15/2019 Document Reviewed: 01/15/2019 Elsevier Patient Education  2020 Elsevier Inc.  If you have any questions or concerns after the infusion please call the Advanced Practice Provider on call at [phone]. This number is ONLY intended for your use regarding questions or concerns about the infusion post-treatment  side-effects.  Please do not provide this number to others for use. For return to work notes please contact your primary care provider.   If someone you know is interested in receiving treatment please have them call the COVID hotline at [phone].    

## 2020-01-21 NOTE — Progress Notes (Signed)
  Diagnosis: COVID-19  Physician: Asencion Noble, MD  Procedure: Covid Infusion Clinic Med: remdesivir infusion - Provided patient with remdesivir fact sheet for patients, parents and caregivers prior to infusion.  Complications: No immediate complications noted.  Discharge: Discharged home   Janne Napoleon 01/21/2020

## 2020-01-22 LAB — CULTURE, BLOOD (ROUTINE X 2)
Culture: NO GROWTH
Culture: NO GROWTH
Special Requests: ADEQUATE

## 2020-01-22 NOTE — Telephone Encounter (Signed)
Two attempts have been made to patient with no call back - this encounter will be closed.  

## 2020-01-24 ENCOUNTER — Other Ambulatory Visit: Payer: Self-pay | Admitting: Family Medicine

## 2020-02-16 ENCOUNTER — Other Ambulatory Visit: Payer: Self-pay | Admitting: Cardiology

## 2020-02-18 ENCOUNTER — Telehealth: Payer: Self-pay | Admitting: Family Medicine

## 2020-02-18 NOTE — Telephone Encounter (Signed)
Pt needs a hospital f/u, she was in the hospital for COVID

## 2020-02-18 NOTE — Telephone Encounter (Signed)
Hospital follow up appointment scheduled on 03/03/20 at 3:55 pm with Dr. Louanne Skye

## 2020-02-23 ENCOUNTER — Other Ambulatory Visit: Payer: Self-pay | Admitting: Family Medicine

## 2020-03-02 ENCOUNTER — Other Ambulatory Visit: Payer: Self-pay | Admitting: Family Medicine

## 2020-03-02 DIAGNOSIS — Z1231 Encounter for screening mammogram for malignant neoplasm of breast: Secondary | ICD-10-CM

## 2020-03-03 ENCOUNTER — Encounter: Payer: Self-pay | Admitting: Family Medicine

## 2020-03-03 ENCOUNTER — Ambulatory Visit (INDEPENDENT_AMBULATORY_CARE_PROVIDER_SITE_OTHER): Payer: Medicare Other | Admitting: Family Medicine

## 2020-03-03 ENCOUNTER — Other Ambulatory Visit: Payer: Self-pay

## 2020-03-03 VITALS — BP 140/82 | HR 76 | Ht 66.0 in | Wt 237.0 lb

## 2020-03-03 DIAGNOSIS — J1282 Pneumonia due to coronavirus disease 2019: Secondary | ICD-10-CM

## 2020-03-03 DIAGNOSIS — U071 COVID-19: Secondary | ICD-10-CM | POA: Diagnosis not present

## 2020-03-03 DIAGNOSIS — I209 Angina pectoris, unspecified: Secondary | ICD-10-CM | POA: Diagnosis not present

## 2020-03-03 DIAGNOSIS — R739 Hyperglycemia, unspecified: Secondary | ICD-10-CM | POA: Diagnosis not present

## 2020-03-03 NOTE — Progress Notes (Signed)
BP 140/82   Pulse 76   Ht '5\' 6"'  (1.676 m)   Wt 237 lb (107.5 kg)   SpO2 100%   BMI 38.25 kg/m    Subjective:   Patient ID: Courtney Mcconnell, female    DOB: 14-Dec-1955, 65 y.o.   MRN: [id number]  HPI: Courtney Mcconnell is a 65 y.o. female presenting on 03/03/2020 for Hospitalization Follow-up   HPI Hospital follow-up for COVID-pneumonia Patient was admitted to the hospital on 01/17/2020 and discharged on 01/19/2020 with a diagnosis of COVID-pneumonia.  She had respiratory failure and was on oxygen given steroids during the hospitalization.  She even went home with oxygen initially.  Patient has been off the oxygen for over 3 weeks now and has been off the steroids and feels like her breathing is doing good, she still uses her inhalers like she was previously but feels like she is back to baseline on that.  She still have some weakness and energy now but it is improving and she is working on trying to get better and she has one of the pedal likes to see if she can get herself moving more.  She denies any further fevers or chills or increased swelling.  She does have the occasional intermittent chest pain that is very infrequent.  She does have a cardiologist and recommended for her to go back and see her cardiologist especially post [place].  The chest pains that she has are very short-lived and she takes a nitro she is only had to do this 3 times over the past couple months.  Relevant past medical, surgical, family and social history reviewed and updated as indicated. Interim medical history since our last visit reviewed. Allergies and medications reviewed and updated.  Review of Systems  Constitutional: Negative for chills and fever.  HENT: Negative for congestion, ear discharge and ear pain.   Eyes: Negative for redness and visual disturbance.  Respiratory: Negative for chest tightness and shortness of breath.   Cardiovascular: Positive for chest pain. Negative for leg swelling.  Genitourinary:  Negative for difficulty urinating and dysuria.  Musculoskeletal: Negative for back pain and gait problem.  Skin: Negative for rash.  Neurological: Negative for dizziness, weakness, light-headedness and headaches.  Psychiatric/Behavioral: Negative for agitation and behavioral problems.  All other systems reviewed and are negative.   Per HPI unless specifically indicated above   Allergies as of 03/03/2020      Reactions   Penicillins Hives   Bee Venom Swelling   Amoxicillin-pot Clavulanate Rash   Doxycycline Hyclate Rash   Rash and itching      Medication List       Accurate as of March 03, 2020 11:59 PM. If you have any questions, ask your nurse or doctor.        albuterol 108 (90 Base) MCG/ACT inhaler Commonly known as: Ventolin HFA INHALE TWO PUFFS INTO THE LUNGS EVERY 6 HOURS AS NEEDED FOR WHEEZING OR SHORTNESS OF BREATH   aspirin 81 MG EC tablet Take 1 tablet (81 mg total) by mouth daily.   atorvastatin 40 MG tablet Commonly known as: LIPITOR TAKE 1 TABLET BY MOUTH DAILY AT 6PM   Brilinta 90 MG Tabs tablet Generic drug: ticagrelor TAKE 1 TABLET BY MOUTH TWICE DAILY   carvedilol 3.125 MG tablet Commonly known as: COREG TAKE 1 TABLET BY MOUTH TWICE DAILY WITH A MEAL What changed: See the new instructions.   dexamethasone 6 MG tablet Commonly known as: DECADRON Take 1 tablet (6  mg total) by mouth daily.   Fluticasone-Salmeterol 100-50 MCG/DOSE Aepb Commonly known as: Advair Diskus Inhale 1 puff into the lungs in the morning and at bedtime.   losartan 25 MG tablet Commonly known as: COZAAR Take 1 tablet (25 mg total) by mouth daily.   nitroGLYCERIN 0.4 MG SL tablet Commonly known as: NITROSTAT Place 1 tablet (0.4 mg total) under the tongue every 5 (five) minutes as needed for chest pain.   omeprazole 20 MG tablet Commonly known as: PRILOSEC OTC Take 20 mg by mouth daily.   Spiriva HandiHaler 18 MCG inhalation capsule Generic drug: tiotropium place  ONE CAPSULE into inhaler AND INHALE DAILY What changed: See the new instructions.   Vimpat 150 MG Tabs Generic drug: Lacosamide Take 2 tablets by mouth 2 (two) times daily.   zonisamide 100 MG capsule Commonly known as: ZONEGRAN Take 200 mg by mouth at bedtime.        Objective:   BP 140/82   Pulse 76   Ht '5\' 6"'  (1.676 m)   Wt 237 lb (107.5 kg)   SpO2 100%   BMI 38.25 kg/m   Wt Readings from Last 3 Encounters:  03/03/20 237 lb (107.5 kg)  01/17/20 233 lb 14.5 oz (106.1 kg)  10/23/19 238 lb (108 kg)    Physical Exam Vitals and nursing note reviewed.  Constitutional:      General: She is not in acute distress.    Appearance: She is well-developed and well-nourished. She is not diaphoretic.  Eyes:     Extraocular Movements: EOM normal.     Conjunctiva/sclera: Conjunctivae normal.  Cardiovascular:     Rate and Rhythm: Normal rate and regular rhythm.     Pulses: Intact distal pulses.     Heart sounds: Normal heart sounds. No murmur heard.   Pulmonary:     Effort: Pulmonary effort is normal. No respiratory distress.     Breath sounds: Normal breath sounds. No wheezing.  Musculoskeletal:        General: Swelling (Trace edema in bilateral lower extremities) present. No tenderness or edema. Normal range of motion.  Skin:    General: Skin is warm and dry.     Findings: No rash.  Neurological:     Mental Status: She is alert and oriented to person, place, and time.     Coordination: Coordination normal.  Psychiatric:        Mood and Affect: Mood and affect normal.        Behavior: Behavior normal.       Assessment & Plan:   Problem List Items Addressed This Visit      Respiratory   Pneumonia due to COVID-19 virus - Primary   Relevant Orders   CBC with Differential/Platelet (Completed)   CMP14+EGFR (Completed)    Other Visit Diagnoses    Elevated blood sugar level       Relevant Orders   CBC with Differential/Platelet (Completed)   CMP14+EGFR  (Completed)   Angina pectoris ([place])        Patient is doing well, will check blood work, recommended for her to follow-up with her cardiologist because of the intermittent chest pains that are short-lived and having to take nitro.  She will schedule an appointment with them.  She has not experienced any chest pain or tightness today  Follow up plan: Return in about 2 months (around 05/01/2020), or if symptoms worsen or fail to improve, for Follow-up blood sugar and blood work.  Counseling provided for all  of the vaccine components Orders Placed This Encounter  Procedures  . CBC with Differential/Platelet  . [place], MD [facility] 03/13/2020, 10:36 PM

## 2020-03-04 LAB — CMP14+EGFR
ALT: 5 IU/L (ref 0–32)
AST: 10 IU/L (ref 0–40)
Albumin/Globulin Ratio: 1.7 (ref 1.2–2.2)
Albumin: 4.2 g/dL (ref 3.8–4.8)
Alkaline Phosphatase: 114 IU/L (ref 44–121)
BUN/Creatinine Ratio: 11 — ABNORMAL LOW (ref 12–28)
BUN: 9 mg/dL (ref 8–27)
Bilirubin Total: 0.2 mg/dL (ref 0.0–1.2)
CO2: 26 mmol/L (ref 20–29)
Calcium: 9.2 mg/dL (ref 8.7–10.3)
Chloride: 104 mmol/L (ref 96–106)
Creatinine, Ser: 0.82 mg/dL (ref 0.57–1.00)
GFR calc Af Amer: 87 mL/min/{1.73_m2} (ref >59)
GFR calc non Af Amer: 76 mL/min/{1.73_m2} (ref >59)
Globulin, Total: 2.5 g/dL (ref 1.5–4.5)
Glucose: 214 mg/dL — ABNORMAL HIGH (ref 65–99)
Potassium: 4.2 mmol/L (ref 3.5–5.2)
Sodium: 140 mmol/L (ref 134–144)
Total Protein: 6.7 g/dL (ref 6.0–8.5)

## 2020-03-04 LAB — CBC WITH DIFFERENTIAL/PLATELET
Basophils Absolute: 0.1 10*3/uL (ref 0.0–0.2)
Basos: 1 %
EOS (ABSOLUTE): 0.3 10*3/uL (ref 0.0–0.4)
Eos: 3 %
Hematocrit: 40 % (ref 34.0–46.6)
Hemoglobin: 13.2 g/dL (ref 11.1–15.9)
Immature Grans (Abs): 0 10*3/uL (ref 0.0–0.1)
Immature Granulocytes: 0 %
Lymphocytes Absolute: 3.1 10*3/uL (ref 0.7–3.1)
Lymphs: 34 %
MCH: 31.8 pg (ref 26.6–33.0)
MCHC: 33 g/dL (ref 31.5–35.7)
MCV: 96 fL (ref 79–97)
Monocytes Absolute: 1 10*3/uL — ABNORMAL HIGH (ref 0.1–0.9)
Monocytes: 11 %
Neutrophils Absolute: 4.7 10*3/uL (ref 1.4–7.0)
Neutrophils: 51 %
Platelets: 266 10*3/uL (ref 150–450)
RBC: 4.15 x10E6/uL (ref 3.77–5.28)
RDW: 13.4 % (ref 11.7–15.4)
WBC: 9.2 10*3/uL (ref 3.4–10.8)

## 2020-03-17 ENCOUNTER — Other Ambulatory Visit: Payer: Self-pay | Admitting: Cardiology

## 2020-03-17 ENCOUNTER — Other Ambulatory Visit: Payer: Self-pay | Admitting: Family Medicine

## 2020-03-17 DIAGNOSIS — J209 Acute bronchitis, unspecified: Secondary | ICD-10-CM

## 2020-03-17 DIAGNOSIS — J440 Chronic obstructive pulmonary disease with acute lower respiratory infection: Secondary | ICD-10-CM

## 2020-04-06 ENCOUNTER — Ambulatory Visit
Admission: RE | Admit: 2020-04-06 | Discharge: 2020-04-06 | Disposition: A | Discharge status: Home / Self Care | Payer: Medicare Other | Source: Ambulatory Visit | Attending: Family Medicine | Admitting: Family Medicine

## 2020-04-06 ENCOUNTER — Other Ambulatory Visit: Payer: Self-pay

## 2020-04-06 DIAGNOSIS — Z1231 Encounter for screening mammogram for malignant neoplasm of breast: Secondary | ICD-10-CM

## 2020-04-11 ENCOUNTER — Encounter (HOSPITAL_COMMUNITY): Payer: Self-pay

## 2020-04-11 ENCOUNTER — Emergency Department (HOSPITAL_COMMUNITY): Payer: Medicare Other

## 2020-04-11 ENCOUNTER — Telehealth: Payer: Self-pay

## 2020-04-11 ENCOUNTER — Other Ambulatory Visit: Payer: Self-pay

## 2020-04-11 ENCOUNTER — Emergency Department (HOSPITAL_COMMUNITY)
Admission: EM | Admit: 2020-04-11 | Discharge: 2020-04-11 | Disposition: A | Discharge status: Home / Self Care | Payer: Medicare Other | Attending: Emergency Medicine | Admitting: Emergency Medicine

## 2020-04-11 DIAGNOSIS — Z7982 Long term (current) use of aspirin: Secondary | ICD-10-CM | POA: Insufficient documentation

## 2020-04-11 DIAGNOSIS — Z87891 Personal history of nicotine dependence: Secondary | ICD-10-CM | POA: Insufficient documentation

## 2020-04-11 DIAGNOSIS — I10 Essential (primary) hypertension: Secondary | ICD-10-CM | POA: Insufficient documentation

## 2020-04-11 DIAGNOSIS — Z7951 Long term (current) use of inhaled steroids: Secondary | ICD-10-CM | POA: Insufficient documentation

## 2020-04-11 DIAGNOSIS — J449 Chronic obstructive pulmonary disease, unspecified: Secondary | ICD-10-CM | POA: Insufficient documentation

## 2020-04-11 DIAGNOSIS — M25562 Pain in left knee: Secondary | ICD-10-CM

## 2020-04-11 DIAGNOSIS — I2510 Atherosclerotic heart disease of native coronary artery without angina pectoris: Secondary | ICD-10-CM | POA: Diagnosis not present

## 2020-04-11 DIAGNOSIS — Z8616 Personal history of COVID-19: Secondary | ICD-10-CM | POA: Insufficient documentation

## 2020-04-11 DIAGNOSIS — J45909 Unspecified asthma, uncomplicated: Secondary | ICD-10-CM | POA: Diagnosis not present

## 2020-04-11 DIAGNOSIS — M1712 Unilateral primary osteoarthritis, left knee: Secondary | ICD-10-CM | POA: Diagnosis not present

## 2020-04-11 DIAGNOSIS — R600 Localized edema: Secondary | ICD-10-CM | POA: Diagnosis not present

## 2020-04-11 DIAGNOSIS — M25462 Effusion, left knee: Secondary | ICD-10-CM | POA: Diagnosis not present

## 2020-04-11 DIAGNOSIS — Z79899 Other long term (current) drug therapy: Secondary | ICD-10-CM | POA: Insufficient documentation

## 2020-04-11 LAB — GLUCOSE, PLEURAL OR PERITONEAL FLUID: Glucose, Fluid: 141 mg/dL

## 2020-04-11 LAB — PROTEIN, PLEURAL OR PERITONEAL FLUID: Total protein, fluid: <3 g/dL

## 2020-04-11 LAB — BODY FLUID CELL COUNT WITH DIFFERENTIAL

## 2020-04-11 LAB — SYNOVIAL CELL COUNT + DIFF, W/ CRYSTALS
Crystals, Fluid: NONE SEEN
Eosinophils-Synovial: 0 % (ref 0–1)
Lymphocytes-Synovial Fld: 9 % (ref 0–20)
Monocyte-Macrophage-Synovial Fluid: 1 % — ABNORMAL LOW (ref 50–90)
Neutrophil, Synovial: 90 % — ABNORMAL HIGH (ref 0–25)
WBC, Synovial: 357 /mm3 — ABNORMAL HIGH (ref 0–200)

## 2020-04-11 LAB — CBC WITH DIFFERENTIAL/PLATELET
Abs Immature Granulocytes: 0.03 10*3/uL (ref 0.00–0.07)
Basophils Absolute: 0 10*3/uL (ref 0.0–0.1)
Basophils Relative: 0 %
Eosinophils Absolute: 0.1 10*3/uL (ref 0.0–0.5)
Eosinophils Relative: 1 %
HCT: 39 % (ref 36.0–46.0)
Hemoglobin: 12.6 g/dL (ref 12.0–15.0)
Immature Granulocytes: 0 %
Lymphocytes Relative: 28 %
Lymphs Abs: 2.6 10*3/uL (ref 0.7–4.0)
MCH: 32.2 pg (ref 26.0–34.0)
MCHC: 32.3 g/dL (ref 30.0–36.0)
MCV: 99.7 fL (ref 80.0–100.0)
Monocytes Absolute: 0.8 10*3/uL (ref 0.1–1.0)
Monocytes Relative: 9 %
Neutro Abs: 5.6 10*3/uL (ref 1.7–7.7)
Neutrophils Relative %: 62 %
Platelets: 241 10*3/uL (ref 150–400)
RBC: 3.91 MIL/uL (ref 3.87–5.11)
RDW: 13.9 % (ref 11.5–15.5)
WBC: 9.1 10*3/uL (ref 4.0–10.5)
nRBC: 0 % (ref 0.0–0.2)

## 2020-04-11 LAB — GRAM STAIN: Gram Stain: NONE SEEN

## 2020-04-11 LAB — BASIC METABOLIC PANEL
Anion gap: 8 (ref 5–15)
BUN: 11 mg/dL (ref 8–23)
CO2: 26 mmol/L (ref 22–32)
Calcium: 9.2 mg/dL (ref 8.9–10.3)
Chloride: 105 mmol/L (ref 98–111)
Creatinine, Ser: 0.83 mg/dL (ref 0.44–1.00)
GFR, Estimated: >60 mL/min (ref >60)
Glucose, Bld: 141 mg/dL — ABNORMAL HIGH (ref 70–99)
Potassium: 4.2 mmol/L (ref 3.5–5.1)
Sodium: 139 mmol/L (ref 135–145)

## 2020-04-11 MED ORDER — OXYCODONE-ACETAMINOPHEN 5-325 MG PO TABS
2.0000 | ORAL_TABLET | Freq: Once | ORAL | Status: AC
  Administered 2020-04-11: 2 via ORAL
  Filled 2020-04-11: qty 2

## 2020-04-11 MED ORDER — LIDOCAINE-EPINEPHRINE (PF) 2 %-1:200000 IJ SOLN
20.0000 mL | Freq: Once | INTRAMUSCULAR | Status: AC
  Administered 2020-04-11: 20 mL
  Filled 2020-04-11: qty 20

## 2020-04-11 NOTE — Telephone Encounter (Signed)
appt made for 04/14/20, pt aware

## 2020-04-11 NOTE — ED Notes (Signed)
Pt to Xray.

## 2020-04-11 NOTE — ED Triage Notes (Signed)
Pt c/o left knee pain that started about 2300 last night. Pt denies any injury.

## 2020-04-11 NOTE — ED Provider Notes (Signed)
[facility] Provider Note   CSN: [id number] Arrival date & time: 04/11/20  0315     History Chief Complaint  Patient presents with  . Knee Pain    Courtney Mcconnell is a 65 y.o. female.   Knee Pain Location:  Knee Time since incident:  5 hours Injury: no   Knee location:  L knee Pain details:    Quality:  Aching and pressure   Radiates to:  Does not radiate   Severity:  Mild   Timing:  Constant   Progression:  Worsening Chronicity:  New Dislocation: no   Foreign body present:  No foreign bodies Relieved by:  None tried Worsened by:  Nothing Ineffective treatments:  None tried Associated symptoms: decreased ROM   Associated symptoms: no fatigue, no fever, no neck pain, no numbness, no stiffness, no swelling and no tingling   Risk factors: obesity        Past Medical History:  Diagnosis Date  . Asthma   . Bursitis of left hip    right  . COPD (chronic obstructive pulmonary disease) ([place])   . GERD (gastroesophageal reflux disease)   . Headache(784.0)   . Hyperlipidemia   . Hypertension   . NSTEMI (non-ST elevated myocardial infarction) ([place]) 02/11/2019   PCI/DES x1 to mLAD, focal ostial 70% lesion in 1st diag  . Pneumonia   . Seizure ([place])    most recent 12/12/14  . Sleep apnea     Patient Active Problem List   Diagnosis Date Noted  . Pneumonia due to COVID-19 virus 01/17/2020  . Hypokalemia 01/17/2020  . Left shoulder pain 10/23/2019  . Decreased ROM of left shoulder 10/23/2019  . Coronary artery disease involving native coronary artery of native heart with angina pectoris ([place]) 05/22/2019  . Hyperlipidemia with target LDL less than 70 -> CAD 02/12/2019  . Presence of drug coated stent in LAD coronary artery 02/11/2019  . History of non-ST elevation myocardial infarction (NSTEMI) 02/10/2019  . Cerebral infarction, remote, resolved 06/27/2018  . Pulmonary nodules 12/08/2015  . 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 Sleep  apnea 04/06/2015  . Pulmonary emphysema ([place]) 03/02/2015  . Smoker 02/16/2015  . Seizure disorder ([place]) 01/21/2015  . Focal epilepsy with impairment of consciousness ([place]) 12/13/2014  . Seizures ([place]) 04/18/2014  . Essential hypertension 04/18/2014  . 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 Tobacco use disorder 07/19/2012    Past Surgical History:  Procedure Laterality Date  . ABDOMINAL HYSTERECTOMY    . APPENDECTOMY    . CATARACT EXTRACTION Bilateral   . CHOLECYSTECTOMY    . CORONARY STENT INTERVENTION N/A 02/11/2019   Procedure: CORONARY STENT INTERVENTION;  Surgeon: Burnell Blanks, MD;  Location: [facility];; mid LAD 70% -- DES PCI (Synergy DES 2.5 x 28 --2.8 mm)  . heal spur     right  . INTRAVASCULAR PRESSURE WIRE/FFR STUDY N/A 02/11/2019   Procedure: INTRAVASCULAR PRESSURE WIRE/FFR STUDY;  Surgeon: Burnell Blanks, MD;  Location: [facility];  Service: Cardiovascular;  Laterality: N/A;  . LASER PHOTO ABLATION Right 08/27/2013   Procedure: LASER PHOTO ABLATION;  Surgeon: Hayden Pedro, MD;  Location: [facility];  Service: Ophthalmology;  Laterality: Right;  Headscope laser AND Endolaser  . LEFT HEART CATH AND CORONARY ANGIOGRAPHY N/A 02/11/2019   Procedure: LEFT HEART CATH AND CORONARY ANGIOGRAPHY;  Surgeon: Burnell Blanks, MD;  Location: [facility] INVASIVE [facility] LAB;;; prox RCA 20%. Ost-prox Cx 20%. ostial D1 70% &  mLAD 70% => (DES PCI of LAD)  . MEMBRANE PEEL Right 08/27/2013   Procedure: MEMBRANE PEEL;  Surgeon: Hayden Pedro, MD;  Location: [facility];  Service: Ophthalmology;  Laterality: Right;  . PARS PLANA VITRECTOMY Right 08/27/2013   Procedure: PARS PLANA VITRECTOMY WITH 25 GAUGE;  Surgeon: Hayden Pedro, MD;  Location: [facility];  Service: Ophthalmology;  Laterality: Right;     OB History    Gravida  2   Para  2   Term  2   Preterm      AB      Living  3     SAB      IAB      Ectopic      Multiple      Live Births               Family History  Problem Relation Age of Onset  . Diabetes Father   . Hypertension Father   . Hyperlipidemia Father   . COPD Mother   . Diabetes Brother   . Stroke Paternal Grandmother   . Cancer Sister   . Cancer Paternal Uncle   . Colon cancer Neg Hx     Social History   Tobacco Use  . Smoking status: Former Smoker    Packs/day: 0.25    Years: 42.00    Pack years: 10.50    Types: Cigarettes    Quit date: 03/20/2014    Years since quitting: 6.0  . Smokeless tobacco: Never Used  . Tobacco comment: 01/21/15 restarted smoking 4 mos ago  Vaping Use  . Vaping Use: Never used  Substance Use Topics  . Alcohol use: No    Alcohol/week: 0.0 standard drinks  . Drug use: No    Home Medications Prior to Admission medications   Medication Sig Start Date End Date Taking? Authorizing Provider  albuterol (VENTOLIN HFA) 108 (90 Base) MCG/ACT inhaler INHALE TWO PUFFS INTO THE LUNGS EVERY 6 HOURS AS NEEDED FOR WHEEZING OR SHORTNESS OF BREATH 02/23/20   Dettinger, Fransisca Kaufmann, MD  aspirin EC 81 MG EC tablet Take 1 tablet (81 mg total) by mouth daily. 02/12/19   Cheryln Manly, NP  atorvastatin (LIPITOR) 40 MG tablet TAKE 1 TABLET BY MOUTH DAILY AT 6PM 09/14/19   Leonie Man, MD  BRILINTA 90 MG TABS tablet TAKE 1 TABLET BY MOUTH TWICE DAILY 03/17/20   Leonie Man, MD  carvedilol (COREG) 3.125 MG tablet TAKE 1 TABLET BY MOUTH TWICE DAILY WITH A MEAL Patient taking differently: Take 3.125 mg by mouth 2 (two) times daily with a meal. TAKE 1 TABLET BY MOUTH TWICE DAILY WITH A MEAL 11/19/19   Leonie Man, MD  dexamethasone (DECADRON) 6 MG tablet Take 1 tablet (6 mg total) by mouth daily. 01/19/20   Orson Eva, MD  Fluticasone-Salmeterol (ADVAIR DISKUS) 100-50 MCG/DOSE AEPB Inhale 1 puff into the lungs in the morning and at bedtime. 05/27/19   Dettinger, Fransisca Kaufmann, MD  Lacosamide (VIMPAT) 150 MG TABS Take 2 tablets by mouth 2 (two) times daily. 11/02/19   [provider]   losartan (COZAAR) 25 MG tablet Take 1 tablet (25 mg total) by mouth daily. 09/14/19 01/17/20  Leonie Man, MD  nitroGLYCERIN (NITROSTAT) 0.4 MG SL tablet Place 1 tablet (0.4 mg total) under the tongue every 5 (five) minutes as needed for chest pain. 07/30/18   Dettinger, Fransisca Kaufmann, MD  omeprazole (PRILOSEC OTC) 20 MG tablet Take 20 mg by  mouth daily.    [provider]  tiotropium (SPIRIVA HANDIHALER) 18 MCG inhalation capsule Place 1 capsule (18 mcg total) into inhaler and inhale daily. 03/18/20   Dettinger, Fransisca Kaufmann, MD  zonisamide (ZONEGRAN) 100 MG capsule Take 200 mg by mouth at bedtime.  06/13/17   [provider]    Allergies    Penicillins, Bee venom, Amoxicillin-pot clavulanate, and Doxycycline hyclate  Review of Systems   Review of Systems  Constitutional: Negative for fatigue and fever.  Musculoskeletal: Negative for neck pain and stiffness.  All other systems reviewed and are negative.   Physical Exam Updated Vital Signs BP (!) 137/58   Pulse 60   Resp 19   Ht 5\' 6"  (1.676 m)   Wt 104.3 kg   SpO2 100%   BMI 37.12 kg/m   Physical Exam Vitals and nursing note reviewed.  Constitutional:      Appearance: She is well-developed and well-nourished.  HENT:     Head: Normocephalic and atraumatic.     Mouth/Throat:     Mouth: Mucous membranes are moist.     Pharynx: Oropharynx is clear.  Eyes:     Pupils: Pupils are equal, round, and reactive to light.  Cardiovascular:     Rate and Rhythm: Normal rate and regular rhythm.     Comments: Left DP pulse intact Pulmonary:     Effort: No respiratory distress.     Breath sounds: No stridor.  Abdominal:     General: There is no distension.  Musculoskeletal:        General: Swelling and tenderness present. No deformity or signs of injury.     Cervical back: Normal range of motion.     Comments: Left knee with pain in what seems to be the infrapatellar bursa with some swelling to same. No noted erythema/warmth  to the area.   Skin:    General: Skin is warm and dry.  Neurological:     General: No focal deficit present.     Mental Status: She is alert.     ED Results / Procedures / Treatments   Labs (all labs ordered are listed, but only abnormal results are displayed) Labs Reviewed  BASIC METABOLIC PANEL - Abnormal; Notable for the following components:      Result Value   Glucose, Bld 141 (*)    All other components within normal limits  SYNOVIAL CELL COUNT + DIFF, W/ CRYSTALS - Abnormal; Notable for the following components:   Color, Synovial YELLOW (*)    Appearance-Synovial CLOUDY (*)    WBC, Synovial 357 (*)    Neutrophil, Synovial 90 (*)    Monocyte-Macrophage-Synovial Fluid 1 (*)    All other components within normal limits  BODY FLUID CULTURE  GRAM STAIN  CBC WITH DIFFERENTIAL/PLATELET  GLUCOSE, PLEURAL OR PERITONEAL FLUID  PROTEIN, PLEURAL OR PERITONEAL FLUID  BODY FLUID CELL COUNT WITH DIFFERENTIAL    EKG None  Radiology DG Knee 2 Views Left  Result Date: 04/11/2020 CLINICAL DATA:  Knee pain EXAM: LEFT KNEE - 1-2 VIEW COMPARISON:  None. FINDINGS: No evidence of fracture, dislocation,. There is a small knee joint effusion present. Tricompartmental osteoarthritis is noted, most notable within the medial and patellofemoral compartment joint space loss and marginal osteophyte formation. Mild prepatellar subcutaneous edema is seen. IMPRESSION: Tricompartmental osteoarthritis, moderate to advanced within the medial patellofemoral compartment. Small knee joint effusion Electronically Signed   By: Prudencio Pair M.D.   On: 04/11/2020 03:50    Procedures Procedures  Medications Ordered in ED Medications  oxyCODONE-acetaminophen (PERCOCET/ROXICET) 5-325 MG per tablet 2 tablet (2 tablets Oral Given 04/11/20 0343)  lidocaine-EPINEPHrine (XYLOCAINE W/EPI) 2 %-1:200000 (PF) injection 20 mL (20 mLs Infiltration Given by Other 04/11/20 0440)    ED Course  I have reviewed the triage  vital signs and the nursing notes.  Pertinent labs & imaging results that were available during my care of the patient were reviewed by me and considered in my medical decision making (see chart for details).    MDM Rules/Calculators/A&P                          Unlikely septic arthritis based on labs. Will dc w/ ace wrap/crutches. Suggested PCP follow up if not improving. aviod NSAIDs if possible as she is on ASA/brilinta already.   Final Clinical Impression(s) / ED Diagnoses Final diagnoses:  None    Rx / DC Orders ED Discharge Orders    None       Mesner, Corene Cornea, MD 04/11/20 [phone]

## 2020-04-11 NOTE — Telephone Encounter (Signed)
appt made for 03/

## 2020-04-14 ENCOUNTER — Ambulatory Visit (INDEPENDENT_AMBULATORY_CARE_PROVIDER_SITE_OTHER): Payer: Medicare Other | Admitting: Family Medicine

## 2020-04-14 ENCOUNTER — Other Ambulatory Visit: Payer: Self-pay

## 2020-04-14 ENCOUNTER — Encounter: Payer: Self-pay | Admitting: Family Medicine

## 2020-04-14 VITALS — BP 136/82 | HR 63 | Ht 66.0 in | Wt 236.0 lb

## 2020-04-14 DIAGNOSIS — M1712 Unilateral primary osteoarthritis, left knee: Secondary | ICD-10-CM | POA: Diagnosis not present

## 2020-04-14 DIAGNOSIS — I209 Angina pectoris, unspecified: Secondary | ICD-10-CM | POA: Diagnosis not present

## 2020-04-14 DIAGNOSIS — M25562 Pain in left knee: Secondary | ICD-10-CM

## 2020-04-14 MED ORDER — DICLOFENAC SODIUM 1 % EX GEL: 2.0000 g | Freq: Four times a day (QID) | CUTANEOUS | 3 refills | Status: DC

## 2020-04-14 MED ORDER — MELOXICAM 15 MG PO TABS
15.0000 mg | ORAL_TABLET | Freq: Every day | ORAL | 1 refills | Status: DC
Start: 2020-04-14 — End: 2020-06-02

## 2020-04-14 NOTE — Progress Notes (Signed)
BP 136/82   Pulse 63   Ht 5\' 6"  (1.676 m)   Wt 236 lb (107 kg)   SpO2 97%   BMI 38.09 kg/m    Subjective:   Patient ID: Courtney Mcconnell, female    DOB: 1956/01/28, 65 y.o.   MRN: [id number]  HPI: Courtney Mcconnell is a 65 y.o. female presenting on 04/14/2020 for Knee Pain (Left knee pain. Recent ER pain)   HPI Patient complains of left knee pain that she will been bothering her over the past few days.  She did have an ER visit on 04/11/2020 because of the pain was so severe and they did x-rays and an aspiration and did not find anything major abnormalities.  The x-ray did show osteoarthritis.  She still is having swelling and effusion and pain in that left knee, more on the middle side than the outside but on both.  Worse when walking or especially walking up and down stairs.  She denies any popping or catching or giving way.  Relevant past medical, surgical, family and social history reviewed and updated as indicated. Interim medical history since our last visit reviewed. Allergies and medications reviewed and updated.  Review of Systems  Constitutional: Negative for chills and fever.  Eyes: Negative for visual disturbance.  Respiratory: Negative for chest tightness and shortness of breath.   Cardiovascular: Negative for chest pain and leg swelling.  Musculoskeletal: Positive for arthralgias and joint swelling. Negative for back pain and gait problem.  Skin: Negative for rash.  Neurological: Negative for light-headedness and headaches.  Psychiatric/Behavioral: Negative for agitation and behavioral problems.  All other systems reviewed and are negative.   Per HPI unless specifically indicated above   Allergies as of 04/14/2020      Reactions   Penicillins Hives   Bee Venom Swelling   Amoxicillin-pot Clavulanate Rash   Doxycycline Hyclate Rash   Rash and itching      Medication List       Accurate as of April 14, 2020  9:27 AM. If you have any questions, ask your nurse or  doctor.        albuterol 108 (90 Base) MCG/ACT inhaler Commonly known as: Ventolin HFA INHALE TWO PUFFS INTO THE LUNGS EVERY 6 HOURS AS NEEDED FOR WHEEZING OR SHORTNESS OF BREATH   aspirin 81 MG EC tablet Take 1 tablet (81 mg total) by mouth daily.   atorvastatin 40 MG tablet Commonly known as: LIPITOR TAKE 1 TABLET BY MOUTH DAILY AT 6PM   Brilinta 90 MG Tabs tablet Generic drug: ticagrelor TAKE 1 TABLET BY MOUTH TWICE DAILY   carvedilol 3.125 MG tablet Commonly known as: COREG TAKE 1 TABLET BY MOUTH TWICE DAILY WITH A MEAL What changed: additional instructions   dexamethasone 6 MG tablet Commonly known as: DECADRON Take 1 tablet (6 mg total) by mouth daily.   Fluticasone-Salmeterol 100-50 MCG/DOSE Aepb Commonly known as: Advair Diskus Inhale 1 puff into the lungs in the morning and at bedtime.   losartan 25 MG tablet Commonly known as: COZAAR Take 1 tablet (25 mg total) by mouth daily.   nitroGLYCERIN 0.4 MG SL tablet Commonly known as: NITROSTAT Place 1 tablet (0.4 mg total) under the tongue every 5 (five) minutes as needed for chest pain.   omeprazole 20 MG tablet Commonly known as: PRILOSEC OTC Take 20 mg by mouth daily.   Spiriva HandiHaler 18 MCG inhalation capsule Generic drug: tiotropium Place 1 capsule (18 mcg total) into inhaler and inhale  daily.   Vimpat 150 MG Tabs Generic drug: Lacosamide Take 2 tablets by mouth 2 (two) times daily.   zonisamide 100 MG capsule Commonly known as: ZONEGRAN Take 200 mg by mouth at bedtime.        Objective:   BP 136/82   Pulse 63   Ht 5\' 6"  (1.676 m)   Wt 236 lb (107 kg)   SpO2 97%   BMI 38.09 kg/m   Wt Readings from Last 3 Encounters:  04/14/20 236 lb (107 kg)  04/11/20 230 lb (104.3 kg)  03/03/20 237 lb (107.5 kg)    Physical Exam Vitals and nursing note reviewed.  Musculoskeletal:     Left knee: Effusion and crepitus present. No swelling, deformity, erythema or bony tenderness. Normal range of  motion. Tenderness present over the medial joint line and lateral joint line. No LCL laxity, MCL laxity, ACL laxity or PCL laxity.Normal meniscus.       Assessment & Plan:   Problem List Items Addressed This Visit   None   Visit Diagnoses    Primary osteoarthritis of left knee    -  Primary   Relevant Medications   meloxicam (MOBIC) 15 MG tablet   diclofenac Sodium (VOLTAREN) 1 % GEL   Acute pain of left knee       Relevant Medications   diclofenac Sodium (VOLTAREN) 1 % GEL      Patient had x-ray that showed osteoarthritis in both knees but the left knee is what is bothering her, she does have a joint effusion in the aspiration but they did did not show anything major. Follow up plan: Return if symptoms worsen or fail to improve.  Counseling provided for all of the vaccine components No orders of the defined types were placed in this encounter.   Caryl Pina, MD [facility] 04/14/2020, 9:27 AM

## 2020-04-19 ENCOUNTER — Other Ambulatory Visit: Payer: Self-pay | Admitting: Family Medicine

## 2020-04-19 DIAGNOSIS — J439 Emphysema, unspecified: Secondary | ICD-10-CM

## 2020-05-02 ENCOUNTER — Ambulatory Visit: Payer: Medicare Other | Admitting: Family Medicine

## 2020-05-15 ENCOUNTER — Other Ambulatory Visit: Payer: Self-pay | Admitting: Cardiology

## 2020-05-15 ENCOUNTER — Other Ambulatory Visit: Payer: Self-pay | Admitting: Family Medicine

## 2020-05-15 DIAGNOSIS — J439 Emphysema, unspecified: Secondary | ICD-10-CM

## 2020-05-17 ENCOUNTER — Encounter: Payer: Self-pay | Admitting: Cardiology

## 2020-05-17 ENCOUNTER — Other Ambulatory Visit: Payer: Self-pay

## 2020-05-17 ENCOUNTER — Ambulatory Visit (INDEPENDENT_AMBULATORY_CARE_PROVIDER_SITE_OTHER): Payer: Medicare Other | Admitting: Cardiology

## 2020-05-17 VITALS — BP 142/78 | HR 65 | Ht 66.0 in | Wt 240.0 lb

## 2020-05-17 DIAGNOSIS — Z79899 Other long term (current) drug therapy: Secondary | ICD-10-CM | POA: Diagnosis not present

## 2020-05-17 DIAGNOSIS — I25119 Atherosclerotic heart disease of native coronary artery with unspecified angina pectoris: Secondary | ICD-10-CM | POA: Diagnosis not present

## 2020-05-17 DIAGNOSIS — I209 Angina pectoris, unspecified: Secondary | ICD-10-CM | POA: Diagnosis not present

## 2020-05-17 DIAGNOSIS — E785 Hyperlipidemia, unspecified: Secondary | ICD-10-CM

## 2020-05-17 NOTE — Patient Instructions (Addendum)
Medication Instructions:   Your physician has recommended you make the following change in your medication:   Stop brilinta after current supply is finished  Continue other medications the same  Labwork: Your physician recommends that you return for a FASTING lipid profile: Please do not eat or drink for at least 8 hours when you have this done. You may take your medications that morning with a sip of water. This can be done at your family doctor's office lab.  Testing/Procedures:  none  Follow-Up:  Your physician recommends that you schedule a follow-up appointment in: 6 months.  Any Other Special Instructions Will Be Listed Below (If Applicable).  If you need a refill on your cardiac medications before your next appointment, please call your pharmacy.

## 2020-05-17 NOTE — Progress Notes (Signed)
Cardiology Office Note  Date: 05/17/2020   ID: Courtney, Mcconnell 10-10-1955, MRN [id number]  PCP:  Dettinger, Fransisca Kaufmann, MD  Cardiologist:  Rozann Lesches, MD Electrophysiologist:  None   Chief Complaint  Patient presents with  . Cardiac follow-up    History of Present Illness: Courtney Mcconnell is a 65 y.o. female former patient of Dr. Ellyn Hack now presenting to establish follow-up with me.  I reviewed her records and updated the chart.  She was last seen in August 2021 by Mr. Cleaver NP.  She is here today with her daughter.  Overall doing well, no progressive angina symptoms on current medical therapy, only occasional use of nitroglycerin.  We went over her medications and discussed stopping Brilinta at this point, she is greater than a year out from her DES intervention to the LAD.  LDL was 60 in April of last year.  She has not had a repeat fasting lipid profile, this will be arranged.  She reports no intolerances to Lipitor at this time.  Past Medical History:  Diagnosis Date  . Asthma   . Bursitis    Hip  . CAD (coronary artery disease) 02/11/2019   PCI/DES x1 to mLAD, focal ostial 70% lesion in 1st diag  . COPD (chronic obstructive pulmonary disease) ([place])   . Essential hypertension   . GERD (gastroesophageal reflux disease)   . Headache(784.0)   . Hyperlipidemia   . NSTEMI (non-ST elevated myocardial infarction) ([place]) 02/11/2019  . Pneumonia   . Seizure ([place])   . Sleep apnea     Past Surgical History:  Procedure Laterality Date  . ABDOMINAL HYSTERECTOMY    . APPENDECTOMY    . CATARACT EXTRACTION Bilateral   . CHOLECYSTECTOMY    . CORONARY STENT INTERVENTION N/A 02/11/2019   Procedure: CORONARY STENT INTERVENTION;  Surgeon: Burnell Blanks, MD;  Location: [facility];; mid LAD 70% -- DES PCI (Synergy DES 2.5 x 28 --2.8 mm)  . heal spur     right  . INTRAVASCULAR PRESSURE WIRE/FFR STUDY N/A 02/11/2019   Procedure: INTRAVASCULAR PRESSURE  WIRE/FFR STUDY;  Surgeon: Burnell Blanks, MD;  Location: [facility];  Service: Cardiovascular;  Laterality: N/A;  . LASER PHOTO ABLATION Right 08/27/2013   Procedure: LASER PHOTO ABLATION;  Surgeon: Hayden Pedro, MD;  Location: [facility];  Service: Ophthalmology;  Laterality: Right;  Headscope laser AND Endolaser  . LEFT HEART CATH AND CORONARY ANGIOGRAPHY N/A 02/11/2019   Procedure: LEFT HEART CATH AND CORONARY ANGIOGRAPHY;  Surgeon: Burnell Blanks, MD;  Location: [facility];;; prox RCA 20%. Ost-prox Cx 20%. ostial D1 70% & mLAD 70% => (DES PCI of LAD)  . MEMBRANE PEEL Right 08/27/2013   Procedure: MEMBRANE PEEL;  Surgeon: Hayden Pedro, MD;  Location: [facility];  Service: Ophthalmology;  Laterality: Right;  . PARS PLANA VITRECTOMY Right 08/27/2013   Procedure: PARS PLANA VITRECTOMY WITH 25 GAUGE;  Surgeon: Hayden Pedro, MD;  Location: [facility];  Service: Ophthalmology;  Laterality: Right;    Current Outpatient Medications  Medication Sig Dispense Refill  . ADVAIR DISKUS 100-50 MCG/DOSE AEPB inhale ONE PUFF into THE lungs IN THE MORNING AND IN THE EVENING 60 each 2  . albuterol (VENTOLIN HFA) 108 (90 Base) MCG/ACT inhaler INHALE TWO PUFFS INTO THE LUNGS EVERY 6 HOURS AS NEEDED FOR WHEEZING OR SHORTNESS OF BREATH 18 g 0  . aspirin EC 81 MG EC tablet Take 1 tablet (81 mg  total) by mouth daily. 90 tablet 1  . atorvastatin (LIPITOR) 40 MG tablet TAKE 1 TABLET BY MOUTH DAILY AT 6PM 60 tablet 0  . carvedilol (COREG) 3.125 MG tablet TAKE 1 TABLET BY MOUTH TWICE DAILY WITH A MEAL (Patient taking differently: Take 3.125 mg by mouth 2 (two) times daily with a meal. TAKE 1 TABLET BY MOUTH TWICE DAILY WITH A MEAL) 60 tablet 11  . diclofenac Sodium (VOLTAREN) 1 % GEL Apply 2 g topically 4 (four) times daily. 350 g 3  . Lacosamide (VIMPAT) 150 MG TABS Take 2 tablets by mouth 2 (two) times daily.    Marland Kitchen losartan (COZAAR) 25 MG tablet TAKE 1 TABLET BY MOUTH EVERY DAY 60 tablet 5  .  meloxicam (MOBIC) 15 MG tablet Take 1 tablet (15 mg total) by mouth daily. 30 tablet 1  . nitroGLYCERIN (NITROSTAT) 0.4 MG SL tablet Place 1 tablet (0.4 mg total) under the tongue every 5 (five) minutes as needed for chest pain. 30 tablet 5  . omeprazole (PRILOSEC OTC) 20 MG tablet Take 20 mg by mouth daily.    Marland Kitchen tiotropium (SPIRIVA HANDIHALER) 18 MCG inhalation capsule Place 1 capsule (18 mcg total) into inhaler and inhale daily. 90 capsule 0  . zonisamide (ZONEGRAN) 100 MG capsule Take 200 mg by mouth at bedtime.   3   No current facility-administered medications for this visit.   Allergies:  Penicillins, Bee venom, Amoxicillin-pot clavulanate, and Doxycycline hyclate   ROS: Bilateral knee arthritis, reports recent effusions requiring drainage.  Physical Exam: VS:  BP (!) 142/78   Pulse 65   Ht 5\' 6"  (1.676 m)   Wt 240 lb (108.9 kg)   SpO2 98%   BMI 38.74 kg/m , BMI Body mass index is 38.74 kg/m.  Wt Readings from Last 3 Encounters:  05/17/20 240 lb (108.9 kg)  04/14/20 236 lb (107 kg)  04/11/20 230 lb (104.3 kg)    General: Patient appears comfortable at rest. HEENT: Conjunctiva and lids normal, wearing a mask. Neck: Supple, no elevated JVP or carotid bruits, no thyromegaly. Lungs: Clear to auscultation, nonlabored breathing at rest. Cardiac: Regular rate and rhythm, no S3 or significant systolic murmur, no pericardial rub. Extremities: No pitting edema.  ECG:  An ECG dated 01/17/2020 was personally reviewed today and demonstrated:  Sinus rhythm with PVC, RSR' lead V1 and V2.  Recent Labwork: 01/19/2020: Magnesium 2.5 03/03/2020: ALT 5; AST 10 04/11/2020: BUN 11; Creatinine, Ser 0.83; Hemoglobin 12.6; Platelets 241; Potassium 4.2; Sodium 139     Component Value Date/Time   CHOL 127 05/26/2019 0912   TRIG 68 01/17/2020 2107   HDL 48 05/26/2019 0912   CHOLHDL 2.6 05/26/2019 0912   CHOLHDL 3.9 02/11/2019 0211   VLDL 12 02/11/2019 0211   LDLCALC 60 05/26/2019 0912    LDLDIRECT 151 (H) 07/06/2015 1604    Other Studies Reviewed Today:  Echocardiogram 02/11/2019: 1. Left ventricular ejection fraction, by visual estimation, is 55 to  60%. The left ventricle has normal function. There is no left ventricular  hypertrophy.  2. Left ventricular diastolic parameters are consistent with Grade I  diastolic dysfunction (impaired relaxation).  3. The left ventricle has no regional wall motion abnormalities.  4. Global right ventricle has normal systolic function.The right  ventricular size is normal. No increase in right ventricular wall  thickness.  5. Left atrial size was normal.  6. Right atrial size was normal.  7. Presence of pericardial fat pad.  8. Trivial pericardial effusion is  present.  9. The mitral valve is grossly normal. Trivial mitral valve  regurgitation.  10. The tricuspid valve is grossly normal.  11. The aortic valve is grossly normal. Aortic valve regurgitation is not  visualized. No evidence of aortic valve sclerosis or stenosis.  12. The pulmonic valve was grossly normal. Pulmonic valve regurgitation is  not visualized.  13. There is mild dilatation of the ascending aorta measuring 40 mm.  14. TR signal is inadequate for assessing pulmonary artery systolic  pressure.  15. The inferior vena cava is normal in size with greater than 50%  respiratory variability, suggesting right atrial pressure of 3 mmHg.  16. No prior Echocardiogram.  17. The average left ventricular global longitudinal strain is -16.3 %.   Assessment and Plan:  1.  CAD status post DES to the mid LAD in December 2020.  She is symptomatically stable medical therapy.  Plan to stop Brilinta at this point.  Otherwise continue aspirin, Coreg, Lipitor, Cozaar, and as needed nitroglycerin.  2.  Mixed hyperlipidemia, tolerating Lipitor.  Follow-up fasting lipid profile.  Medication Adjustments/Labs and Tests Ordered: Current medicines are reviewed at length with  the patient today.  Concerns regarding medicines are outlined above.   Tests Ordered: Orders Placed This Encounter  Procedures  . Lipid panel    Medication Changes: No orders of the defined types were placed in this encounter.   Disposition:  Follow up 6 months in the [place] office.  Signed, Satira Sark, MD, [facility] 05/17/2020 4:38 PM    [facility] [place], [place], [place] [address] Phone: [phone]; Fax: [phone]

## 2020-05-30 ENCOUNTER — Telehealth: Payer: Self-pay

## 2020-05-30 NOTE — Telephone Encounter (Signed)
Key: [id number] - Rx #: [id number]  PA initiated through [employer] for Diclofenac 1% gel.   Dx: Osteoarthritis of left knee

## 2020-05-30 NOTE — Telephone Encounter (Signed)
This request has been approved. [employer]  will send a letter to the member and you regarding this decision.   [facility] has been made aware.

## 2020-06-02 ENCOUNTER — Other Ambulatory Visit: Payer: Self-pay | Admitting: Family Medicine

## 2020-06-02 ENCOUNTER — Encounter: Payer: Self-pay | Admitting: Family Medicine

## 2020-06-02 DIAGNOSIS — M1712 Unilateral primary osteoarthritis, left knee: Secondary | ICD-10-CM

## 2020-06-09 ENCOUNTER — Other Ambulatory Visit: Payer: Self-pay

## 2020-06-09 ENCOUNTER — Other Ambulatory Visit: Payer: Self-pay | Admitting: Family Medicine

## 2020-06-09 DIAGNOSIS — J440 Chronic obstructive pulmonary disease with acute lower respiratory infection: Secondary | ICD-10-CM

## 2020-06-09 DIAGNOSIS — M1712 Unilateral primary osteoarthritis, left knee: Secondary | ICD-10-CM

## 2020-06-09 DIAGNOSIS — J209 Acute bronchitis, unspecified: Secondary | ICD-10-CM

## 2020-06-10 MED ORDER — ATORVASTATIN CALCIUM 40 MG PO TABS: ORAL_TABLET | ORAL | 1 refills | Status: DC

## 2020-06-10 MED ORDER — ALBUTEROL SULFATE HFA 108 (90 BASE) MCG/ACT IN AERS: INHALATION_SPRAY | RESPIRATORY_TRACT | 0 refills | Status: DC

## 2020-06-10 MED ORDER — SPIRIVA HANDIHALER 18 MCG IN CAPS: 18.0000 ug | ORAL_CAPSULE | Freq: Every day | RESPIRATORY_TRACT | 0 refills | Status: DC

## 2020-06-10 MED ORDER — MELOXICAM 15 MG PO TABS: 1.0000 | ORAL_TABLET | Freq: Every day | ORAL | 0 refills | Status: DC

## 2020-06-14 ENCOUNTER — Telehealth: Payer: Self-pay | Admitting: *Deleted

## 2020-06-21 NOTE — Telephone Encounter (Signed)
mychart message sent

## 2020-06-24 ENCOUNTER — Other Ambulatory Visit: Payer: Self-pay

## 2020-06-24 ENCOUNTER — Encounter: Payer: Self-pay | Admitting: Family Medicine

## 2020-06-24 ENCOUNTER — Ambulatory Visit (INDEPENDENT_AMBULATORY_CARE_PROVIDER_SITE_OTHER): Payer: Medicare Other | Admitting: Family Medicine

## 2020-06-24 VITALS — BP 128/69 | HR 68 | Ht 66.0 in | Wt 232.0 lb

## 2020-06-24 DIAGNOSIS — I209 Angina pectoris, unspecified: Secondary | ICD-10-CM

## 2020-06-24 DIAGNOSIS — G8929 Other chronic pain: Secondary | ICD-10-CM

## 2020-06-24 DIAGNOSIS — M25562 Pain in left knee: Secondary | ICD-10-CM | POA: Diagnosis not present

## 2020-06-24 MED ORDER — METHYLPREDNISOLONE ACETATE 40 MG/ML IJ SUSP
80.0000 mg | Freq: Once | INTRAMUSCULAR | Status: AC
  Administered 2020-06-24: 80 mg via INTRAMUSCULAR

## 2020-06-24 NOTE — Telephone Encounter (Signed)
Taking Brilinta once a day would not be helpful, but a maintenance dose could be resumed at Brilinta 60 mg twice daily.  I would say though, that if she is having recurring chest pain, she may need to be reevaluated to make sure that nothing else is going on.  Would keep an eye on symptoms and we can go ahead and resume the maintenance dose of Brilinta if she would like.

## 2020-06-24 NOTE — Progress Notes (Signed)
BP 128/69   Pulse 68   Ht 5\' 6"  (1.676 m)   Wt 232 lb (105.2 kg)   SpO2 96%   BMI 37.45 kg/m    Subjective:   Patient ID: Courtney Mcconnell, female    DOB: 10/27/1955, 65 y.o.   MRN: [id number]  HPI: Courtney Mcconnell is a 65 y.o. female presenting on 06/24/2020 for Knee Pain (Left knee. Seen at [facility] in Feb on the 28th. Fluid drawn and xray showed RA)   HPI Is coming in with left knee pain that is been bothering her but still been bothering her over the past 3 months.  She was seen in a pan to have fluid drawn and showed white count but nothing else in there.  She has not had any further blood testing.  We will try to see if we can give her anti-inflammatories the last time she was seen and they did not seem to help significantly.  She denies any fevers or chills or redness or warmth.  Patient is still having significant pain in that left knee.  It wakes her up at night and she cannot bend it very easily and has had to use a knee  Relevant past medical, surgical, family and social history reviewed and updated as indicated. Interim medical history since our last visit reviewed. Allergies and medications reviewed and updated.  Review of Systems  Constitutional: Negative for chills and fever.  Eyes: Negative for visual disturbance.  Respiratory: Negative for chest tightness and shortness of breath.   Cardiovascular: Negative for chest pain and leg swelling.  Musculoskeletal: Positive for arthralgias and gait problem. Negative for back pain.  Skin: Negative for rash.  Neurological: Negative for light-headedness and headaches.  Psychiatric/Behavioral: Negative for agitation and behavioral problems.  All other systems reviewed and are negative.   Per HPI unless specifically indicated above   Allergies as of 06/24/2020      Reactions   Penicillins Hives   Bee Venom Swelling   Amoxicillin-pot Clavulanate Rash   Doxycycline Hyclate Rash   Rash and itching      Medication List        Accurate as of Jun 24, 2020  1:37 PM. If you have any questions, ask your nurse or doctor.        Advair Diskus 100-50 MCG/ACT Aepb Generic drug: fluticasone-salmeterol inhale ONE PUFF into THE lungs IN THE MORNING AND IN THE EVENING   albuterol 108 (90 Base) MCG/ACT inhaler Commonly known as: Ventolin HFA INHALE TWO PUFFS INTO THE LUNGS EVERY 6 HOURS AS NEEDED FOR WHEEZING OR SHORTNESS OF BREATH   aspirin 81 MG EC tablet Take 1 tablet (81 mg total) by mouth daily.   atorvastatin 40 MG tablet Commonly known as: LIPITOR TAKE 1 TABLET BY MOUTH DAILY AT 6PM   carvedilol 3.125 MG tablet Commonly known as: COREG TAKE 1 TABLET BY MOUTH TWICE DAILY WITH A MEAL What changed: additional instructions   diclofenac Sodium 1 % Gel Commonly known as: Voltaren Apply 2 g topically 4 (four) times daily.   losartan 25 MG tablet Commonly known as: COZAAR TAKE 1 TABLET BY MOUTH EVERY DAY   meloxicam 15 MG tablet Commonly known as: MOBIC Take 1 tablet (15 mg total) by mouth daily.   nitroGLYCERIN 0.4 MG SL tablet Commonly known as: NITROSTAT Place 1 tablet (0.4 mg total) under the tongue every 5 (five) minutes as needed for chest pain.   omeprazole 20 MG tablet Commonly known as: PRILOSEC  OTC Take 20 mg by mouth daily.   Spiriva HandiHaler 18 MCG inhalation capsule Generic drug: tiotropium Place 1 capsule (18 mcg total) into inhaler and inhale daily.   Vimpat 150 MG Tabs Generic drug: Lacosamide Take 2 tablets by mouth 2 (two) times daily.   zonisamide 100 MG capsule Commonly known as: ZONEGRAN Take 200 mg by mouth at bedtime.        Objective:   BP 128/69   Pulse 68   Ht 5\' 6"  (1.676 m)   Wt 232 lb (105.2 kg)   SpO2 96%   BMI 37.45 kg/m   Wt Readings from Last 3 Encounters:  06/24/20 232 lb (105.2 kg)  05/17/20 240 lb (108.9 kg)  04/14/20 236 lb (107 kg)    Physical Exam Musculoskeletal:     Left knee: Swelling and effusion present. No crepitus. Tenderness  present over the lateral joint line.    Knee injection: Consent form signed. Risk factors of bleeding and infection discussed with patient and patient is agreeable towards injection. Patient prepped with Betadine. Lateral approach towards injection used. Injected 80 mg of Depo-Medrol and 1 mL of 2% lidocaine. Patient tolerated procedure well and no side effects from noted. Minimal to no bleeding. Simple bandage applied after.    Assessment & Plan:   Problem List Items Addressed This Visit   None   Visit Diagnoses    Chronic pain of left knee    -  Primary   Relevant Medications   methylPREDNISolone acetate (DEPO-MEDROL) injection 80 mg (Start on 06/24/2020  2:00 PM)   Other Relevant Orders   CBC with Differential/Platelet   ANA,IFA RA Diag Pnl w/rflx Tit/Patn   Arthritis Panel       Follow up plan: Return if symptoms worsen or fail to improve.  Counseling provided for all of the vaccine components No orders of the defined types were placed in this encounter.   Caryl Pina, MD [facility] 06/24/2020, 1:37 PM

## 2020-06-28 ENCOUNTER — Other Ambulatory Visit: Payer: Self-pay

## 2020-06-28 ENCOUNTER — Other Ambulatory Visit: Payer: Medicare Other

## 2020-06-28 DIAGNOSIS — I25119 Atherosclerotic heart disease of native coronary artery with unspecified angina pectoris: Secondary | ICD-10-CM | POA: Diagnosis not present

## 2020-06-28 DIAGNOSIS — Z79899 Other long term (current) drug therapy: Secondary | ICD-10-CM | POA: Diagnosis not present

## 2020-06-28 DIAGNOSIS — E785 Hyperlipidemia, unspecified: Secondary | ICD-10-CM | POA: Diagnosis not present

## 2020-06-28 LAB — ARTHRITIS PANEL
Basophils Absolute: 0 10*3/uL (ref 0.0–0.2)
Basos: 0 %
EOS (ABSOLUTE): 0.1 10*3/uL (ref 0.0–0.4)
Eos: 1 %
Hematocrit: 39.9 % (ref 34.0–46.6)
Hemoglobin: 13.1 g/dL (ref 11.1–15.9)
Immature Grans (Abs): 0 10*3/uL (ref 0.0–0.1)
Immature Granulocytes: 0 %
Lymphocytes Absolute: 2.9 10*3/uL (ref 0.7–3.1)
Lymphs: 31 %
MCH: 31.2 pg (ref 26.6–33.0)
MCHC: 32.8 g/dL (ref 31.5–35.7)
MCV: 95 fL (ref 79–97)
Monocytes Absolute: 0.8 10*3/uL (ref 0.1–0.9)
Monocytes: 8 %
Neutrophils Absolute: 5.6 10*3/uL (ref 1.4–7.0)
Neutrophils: 60 %
Platelets: 283 10*3/uL (ref 150–450)
RBC: 4.2 x10E6/uL (ref 3.77–5.28)
RDW: 12.3 % (ref 11.7–15.4)
Rheumatoid fact SerPl-aCnc: 57.6 IU/mL — ABNORMAL HIGH (ref <14.0)
Sed Rate: 13 mm/hr (ref 0–40)
Uric Acid: 5.5 mg/dL (ref 3.0–7.2)
WBC: 9.4 10*3/uL (ref 3.4–10.8)

## 2020-06-28 LAB — ANA,IFA RA DIAG PNL W/RFLX TIT/PATN
ANA Titer 1: NEGATIVE
Cyclic Citrullin Peptide Ab: >250 units — ABNORMAL HIGH (ref 0–19)

## 2020-06-29 LAB — LIPID PANEL
Chol/HDL Ratio: 2.6 ratio (ref 0.0–4.4)
Cholesterol, Total: 131 mg/dL (ref 100–199)
HDL: 51 mg/dL (ref >39)
LDL Chol Calc (NIH): 64 mg/dL (ref 0–99)
Triglycerides: 81 mg/dL (ref 0–149)
VLDL Cholesterol Cal: 16 mg/dL (ref 5–40)

## 2020-06-30 ENCOUNTER — Telehealth: Payer: Self-pay | Admitting: *Deleted

## 2020-06-30 NOTE — Telephone Encounter (Signed)
Mychart msg sent

## 2020-06-30 NOTE — Telephone Encounter (Signed)
-----   Message from Satira Sark, MD sent at 06/29/2020  8:02 AM EDT ----- Results reviewed.  LDL 64.  Continue current dose of Lipitor.

## 2020-07-01 ENCOUNTER — Other Ambulatory Visit: Payer: Self-pay

## 2020-07-01 DIAGNOSIS — R768 Other specified abnormal immunological findings in serum: Secondary | ICD-10-CM

## 2020-07-27 ENCOUNTER — Encounter: Payer: Self-pay | Admitting: Family Medicine

## 2020-08-09 ENCOUNTER — Ambulatory Visit (INDEPENDENT_AMBULATORY_CARE_PROVIDER_SITE_OTHER): Payer: Medicare Other

## 2020-08-09 ENCOUNTER — Other Ambulatory Visit: Payer: Self-pay

## 2020-08-09 ENCOUNTER — Ambulatory Visit (INDEPENDENT_AMBULATORY_CARE_PROVIDER_SITE_OTHER): Payer: Medicare Other | Admitting: Nurse Practitioner

## 2020-08-09 ENCOUNTER — Encounter: Payer: Self-pay | Admitting: Nurse Practitioner

## 2020-08-09 VITALS — BP 138/80 | HR 72 | Temp 97.7°F | Ht 66.0 in | Wt 233.0 lb

## 2020-08-09 DIAGNOSIS — M25561 Pain in right knee: Secondary | ICD-10-CM

## 2020-08-09 DIAGNOSIS — R600 Localized edema: Secondary | ICD-10-CM | POA: Diagnosis not present

## 2020-08-09 MED ORDER — PREDNISONE 10 MG (21) PO TBPK: ORAL_TABLET | ORAL | 0 refills | Status: DC

## 2020-08-09 MED ORDER — METHYLPREDNISOLONE ACETATE 40 MG/ML IJ SUSP
80.0000 mg | Freq: Once | INTRAMUSCULAR | Status: AC
  Administered 2020-08-09: 80 mg via INTRAMUSCULAR

## 2020-08-09 NOTE — Progress Notes (Signed)
Acute Office Visit  Subjective:    Patient ID: Courtney Mcconnell, female    DOB: February 12, 1956, 65 y.o.   MRN: [id number]  Chief Complaint  Patient presents with   Knee Pain    HPI Patient is in today for Pain  She reports new onset right knee pain. was not an injury that may have caused the pain. The pain started a few days ago and is worsening. The pain does not radiate . The pain is described as aching and soreness, is moderate in intensity, occurring constantly. Symptoms are worse in the: morning, mid-day, afternoon  Aggravating factors: walking Relieving factors: none.  She has tried application of heat and NSAIDs with no relief.   ---------------------------------------------------------------------------------------------------   Past Medical History:  Diagnosis Date   Asthma    Bursitis    Hip   CAD (coronary artery disease) 02/11/2019   PCI/DES x1 to mLAD, focal ostial 70% lesion in 1st diag   COPD (chronic obstructive pulmonary disease) (HCC)    Essential hypertension    GERD (gastroesophageal reflux disease)    Headache(784.0)    Hyperlipidemia    NSTEMI (non-ST elevated myocardial infarction) ([place]) 02/11/2019   Pneumonia    Seizure ([place])    Sleep apnea     Past Surgical History:  Procedure Laterality Date   ABDOMINAL HYSTERECTOMY     APPENDECTOMY     CATARACT EXTRACTION Bilateral    CHOLECYSTECTOMY     CORONARY STENT INTERVENTION N/A 02/11/2019   Procedure: CORONARY STENT INTERVENTION;  Surgeon: Burnell Blanks, MD;  Location: [facility];; mid LAD 70% -- DES PCI (Synergy DES 2.5 x 28 --2.8 mm)   heal spur     right   INTRAVASCULAR PRESSURE WIRE/FFR STUDY N/A 02/11/2019   Procedure: INTRAVASCULAR PRESSURE WIRE/FFR STUDY;  Surgeon: Burnell Blanks, MD;  Location: [facility];  Service: Cardiovascular;  Laterality: N/A;   LASER PHOTO ABLATION Right 08/27/2013   Procedure: LASER PHOTO ABLATION;  Surgeon: Hayden Pedro, MD;   Location: [facility] [place];  Service: Ophthalmology;  Laterality: Right;  Headscope laser AND Endolaser   LEFT HEART CATH AND CORONARY ANGIOGRAPHY N/A 02/11/2019   Procedure: LEFT HEART CATH AND CORONARY ANGIOGRAPHY;  Surgeon: Burnell Blanks, MD;  Location: [facility];;; prox RCA 20%. Ost-prox Cx 20%. ostial D1 70% & mLAD 70% => (DES PCI of LAD)   MEMBRANE PEEL Right 08/27/2013   Procedure: MEMBRANE PEEL;  Surgeon: Hayden Pedro, MD;  Location: [place];  Service: Ophthalmology;  Laterality: Right;   PARS PLANA VITRECTOMY Right 08/27/2013   Procedure: PARS PLANA VITRECTOMY WITH 25 GAUGE;  Surgeon: Hayden Pedro, MD;  Location: [place];  Service: Ophthalmology;  Laterality: Right;    Family History  Problem Relation Age of Onset   Diabetes Father    Hypertension Father    Hyperlipidemia Father    COPD Mother    Diabetes Brother    Stroke Paternal Grandmother    Cancer Sister    Cancer Paternal Uncle    Colon cancer Neg Hx     Social History   Socioeconomic History   Marital status: Married    Spouse name: [name]   Number of children: 2   Years of education: GED   Highest education level: Not on file  Occupational History   Occupation: COOK    Employer: [employer]  Tobacco Use   Smoking status: Former    Packs/day: 0.25  Years: 42.00    Pack years: 10.50    Types: Cigarettes    Quit date: 03/20/2014    Years since quitting: 6.3   Smokeless tobacco: Never   Tobacco comments:    01/21/15 restarted smoking 4 mos ago  Vaping Use   Vaping Use: Never used  Substance and Sexual Activity   Alcohol use: No    Alcohol/week: 0.0 standard drinks   Drug use: No   Sexual activity: Not on file  Other Topics Concern   Not on file  Social History Narrative   Patient lives at home with family.   Caffeine Use: 1 pot and a half a day   Social Determinants of Radio broadcast assistant Strain: Not on file  Food Insecurity: Not on file  Transportation Needs: Not  on file  Physical Activity: Not on file  Stress: Not on file  Social Connections: Not on file  Intimate Partner Violence: Not on file    Outpatient Medications Prior to Visit  Medication Sig Dispense Refill   ADVAIR DISKUS 100-50 MCG/DOSE AEPB inhale ONE PUFF into THE lungs IN THE MORNING AND IN THE EVENING 60 each 2   albuterol (VENTOLIN HFA) 108 (90 Base) MCG/ACT inhaler INHALE TWO PUFFS INTO THE LUNGS EVERY 6 HOURS AS NEEDED FOR WHEEZING OR SHORTNESS OF BREATH 18 g 0   aspirin EC 81 MG EC tablet Take 1 tablet (81 mg total) by mouth daily. 90 tablet 1   atorvastatin (LIPITOR) 40 MG tablet TAKE 1 TABLET BY MOUTH DAILY AT 6PM 90 tablet 1   carvedilol (COREG) 3.125 MG tablet TAKE 1 TABLET BY MOUTH TWICE DAILY WITH A MEAL (Patient taking differently: Take 3.125 mg by mouth 2 (two) times daily with a meal. TAKE 1 TABLET BY MOUTH TWICE DAILY WITH A MEAL) 60 tablet 11   diclofenac Sodium (VOLTAREN) 1 % GEL Apply 2 g topically 4 (four) times daily. 350 g 3   Lacosamide (VIMPAT) 150 MG TABS Take 2 tablets by mouth 2 (two) times daily.     losartan (COZAAR) 25 MG tablet TAKE 1 TABLET BY MOUTH EVERY DAY 60 tablet 5   meloxicam (MOBIC) 15 MG tablet Take 1 tablet (15 mg total) by mouth daily. 30 tablet 0   nitroGLYCERIN (NITROSTAT) 0.4 MG SL tablet Place 1 tablet (0.4 mg total) under the tongue every 5 (five) minutes as needed for chest pain. 30 tablet 5   omeprazole (PRILOSEC OTC) 20 MG tablet Take 20 mg by mouth daily.     tiotropium (SPIRIVA HANDIHALER) 18 MCG inhalation capsule Place 1 capsule (18 mcg total) into inhaler and inhale daily. 90 capsule 0   zonisamide (ZONEGRAN) 100 MG capsule Take 2 capsules by mouth at bedtime.     zonisamide (ZONEGRAN) 100 MG capsule Take 200 mg by mouth at bedtime.   3   No facility-administered medications prior to visit.    Allergies  Allergen Reactions   Penicillins Hives   Bee Venom Swelling   Amoxicillin-Pot Clavulanate Rash   Doxycycline Hyclate Rash     Rash and itching    Review of Systems  Constitutional: Negative.   HENT: Negative.    Respiratory: Negative.    Cardiovascular: Negative.   Gastrointestinal: Negative.   Genitourinary: Negative.   Musculoskeletal:  Positive for joint swelling.  Skin:  Negative for rash.  All other systems reviewed and are negative.     Objective:    Physical Exam Vitals and nursing note reviewed. Exam conducted with  a chaperone present.  Constitutional:      Appearance: Normal appearance.  HENT:     Head: Normocephalic.     Nose: Nose normal.  Eyes:     Conjunctiva/sclera: Conjunctivae normal.  Cardiovascular:     Rate and Rhythm: Normal rate and regular rhythm.  Pulmonary:     Effort: Pulmonary effort is normal.     Breath sounds: Normal breath sounds.  Abdominal:     General: Bowel sounds are normal.  Musculoskeletal:     Right knee: Swelling present. Tenderness present.       Legs:     Comments: Warm to touch and tender  Skin:    Findings: No rash.  Neurological:     Mental Status: She is alert and oriented to person, place, and time.    BP 138/80   Pulse 72   Temp 97.7 F (36.5 C) (Temporal)   Ht 5\' 6"  (1.676 m)   Wt 233 lb (105.7 kg)   SpO2 97%   BMI 37.61 kg/m  Wt Readings from Last 3 Encounters:  08/09/20 233 lb (105.7 kg)  06/24/20 232 lb (105.2 kg)  05/17/20 240 lb (108.9 kg)    Health Maintenance Due  Topic Date Due   COVID-19 Vaccine (1) Never done   Pneumococcal Vaccine 70-59 Years old (1 - PCV) Never done   Zoster Vaccines- Shingrix (1 of 2) Never done    There are no preventive care reminders to display for this patient.   Lab Results  Component Value Date   TSH 3.299 02/11/2019   Lab Results  Component Value Date   WBC 9.4 06/24/2020   HGB 13.1 06/24/2020   HCT 39.9 06/24/2020   MCV 95 06/24/2020   PLT 283 06/24/2020   Lab Results  Component Value Date   NA 139 04/11/2020   K 4.2 04/11/2020   CO2 26 04/11/2020   GLUCOSE 141 (H)  04/11/2020   BUN 11 04/11/2020   CREATININE 0.83 04/11/2020   BILITOT 0.2 03/03/2020   ALKPHOS 114 03/03/2020   AST 10 03/03/2020   ALT 5 03/03/2020   PROT 6.7 03/03/2020   ALBUMIN 4.2 03/03/2020   CALCIUM 9.2 04/11/2020   ANIONGAP 8 04/11/2020   Lab Results  Component Value Date   CHOL 131 06/28/2020   Lab Results  Component Value Date   HDL 51 06/28/2020   Lab Results  Component Value Date   LDLCALC 64 06/28/2020   Lab Results  Component Value Date   TRIG 81 06/28/2020   Lab Results  Component Value Date   CHOLHDL 2.6 06/28/2020   Lab Results  Component Value Date   HGBA1C 7.4 (H) 01/19/2020       Assessment & Plan:   Problem List Items Addressed This Visit       Other   Acute pain of right knee - Primary    New right knee pain the last few days.  Patient given 80 Depo-Medrol shot in clinic, prednisone taper, wrist and knee, elevate joint, prednisone taper. X-ray completed, Rx sent to pharmacy.  Follow-up with worsening unresolved symptoms.       Relevant Medications   predniSONE (STERAPRED UNI-PAK 21 TAB) 10 MG (21) TBPK tablet   Other Relevant Orders   DG Knee 1-2 Views Right   Other Visit Diagnoses     Localized edema       Relevant Medications   methylPREDNISolone acetate (DEPO-MEDROL) injection 80 mg (Completed)   Other Relevant Orders  DG Knee 1-2 Views Right        Meds ordered this encounter  Medications   predniSONE (STERAPRED UNI-PAK 21 TAB) 10 MG (21) TBPK tablet    Sig: 6 tablets day 1, 5 tablet day 2, 4 tablet day 3,3 tablet daily four, 2 tablet day 5, 1 tablet day 6    Dispense:  1 each    Refill:  0    Order Specific Question:   Supervising Provider    Answer:   Janora Norlander [[id number]]   methylPREDNISolone acetate (DEPO-MEDROL) injection 80 mg     Ivy Lynn, NP

## 2020-08-09 NOTE — Assessment & Plan Note (Signed)
New right knee pain the last few days.  Patient given 80 Depo-Medrol shot in clinic, prednisone taper, wrist and knee, elevate joint, prednisone taper. X-ray completed, Rx sent to pharmacy.  Follow-up with worsening unresolved symptoms.

## 2020-08-09 NOTE — Patient Instructions (Signed)
Acute Knee Pain, Adult °Many things can cause knee pain. Sometimes, knee pain is sudden (acute) and may be caused by damage, swelling, or irritation of the muscles and tissues that support your knee. °The pain often goes away on its own with time and rest. If the pain does not go away, tests may be done to find out what is causing the pain. °Follow these instructions at home: °If you have a knee sleeve or brace: ° °Wear the knee sleeve or brace as told by your doctor. Take it off only as told by your doctor. °Loosen it if your toes: °Tingle. °Become numb. °Turn cold and blue. °Keep it clean. °If the knee sleeve or brace is not waterproof: °Do not let it get wet. °Cover it with a watertight covering when you take a bath or shower. °Activity °Rest your knee. °Do not do things that cause pain or make pain worse. °Avoid activities where both feet leave the ground at the same time (high-impact activities). Examples are running, jumping rope, and doing jumping jacks. °Work with a physical therapist to make a safe exercise program, as told by your doctor. °Managing pain, stiffness, and swelling ° °If told, put ice on the knee. To do this: °If you have a removable knee sleeve or brace, take it off as told by your doctor. °Put ice in a plastic bag. °Place a towel between your skin and the bag. °Leave the ice on for 20 minutes, 2-3 times a day. °Take off the ice if your skin turns bright red. This is very important. If you cannot feel pain, heat, or cold, you have a greater risk of damage to the area. °If told, use an elastic bandage to put pressure (compression) on your injured knee. °Raise your knee above the level of your heart while you are sitting or lying down. °Sleep with a pillow under your knee. °General instructions °Take over-the-counter and prescription medicines only as told by your doctor. °Do not smoke or use any products that contain nicotine or tobacco. If you need help quitting, ask your doctor. °If you are  overweight, work with your doctor and a food expert (dietitian) to set goals to lose weight. Being overweight can make your knee hurt more. °Watch for any changes in your symptoms. °Keep all follow-up visits. °Contact a doctor if: °The knee pain does not stop. °The knee pain changes or gets worse. °You have a fever along with knee pain. °Your knee is red or feels warm when you touch it. °Your knee gives out or locks up. °Get help right away if: °Your knee swells, and the swelling gets worse. °You cannot move your knee. °You have very bad knee pain that does not get better with pain medicine. °Summary °Many things can cause knee pain. The pain often goes away on its own with time and rest. °Your doctor may do tests to find out the cause of the pain. °Watch for any changes in your symptoms. Relieve your pain with rest, medicines, light activity, and use of ice. °Get help right away if you cannot move your knee or your knee pain is very bad. °This information is not intended to replace advice given to you by your health care provider. Make sure you discuss any questions you have with your health care provider. °Document Revised: 07/15/2019 Document Reviewed: 07/15/2019 °Elsevier Patient Education © 2022 [employer] ° °

## 2020-09-01 NOTE — Progress Notes (Signed)
Office Visit Note  Patient: Courtney Mcconnell             Date of Birth: 1955/12/06           MRN: [id number]             PCP: Dettinger, Fransisca Kaufmann, MD Referring: Dettinger, Fransisca Kaufmann, MD Visit Date: 09/15/2020 Occupation: @GUAROCC @  Subjective:  Pain in both knees  History of Present Illness: Courtney Mcconnell is a 65 y.o. female accompanied by her daughter Courtney Mcconnell today.  She is seen in consultation per request of her PCP.  According the patient in February 2022 she started having severe left knee joint pain and swelling in the middle of the night.  She went to the emergency room where she had aspiration of the knee joint and x-rays were done.  She was told that she had osteoarthritis.  The symptoms improved but then recurred in April 2022 with left knee joint pain.  She states she went to her PCP where the knee joint was aspirated and injected with cortisone.  It was helpful.  In May 2022 she had recurrence of left knee joint pain at the time she was given meloxicam and diclofenac gel.  In June 2022 she went back to her PCPs office with right knee joint pain and effusion.  She was given a prednisone taper but she could not tolerate the side effects due to jitteriness.  She continues take the meloxicam and uses diclofenac gel.  She continues to have pain and discomfort in her bilateral knee joints.  She states her right knee joint is more painful than the left.  She is occasional discomfort in her feet and her ankles.  She denies any discomfort in her shoulders, elbows and her hands.  There is no family history of autoimmune disease.  She is gravida 4, para 4.  No history of DVTs.   Activities of Daily Living:  Patient reports morning stiffness for 24 hours.   Patient Reports nocturnal pain.  Difficulty dressing/grooming: Reports Difficulty climbing stairs: Reports Difficulty getting out of chair: Reports Difficulty using hands for taps, buttons, cutlery, and/or writing: Reports  Review of Systems   Constitutional:  Positive for fatigue.  HENT:  Positive for mouth dryness. Negative for mouth sores and nose dryness.   Eyes:  Positive for itching. Negative for pain, visual disturbance and dryness.  Respiratory:  Negative for cough, hemoptysis, shortness of breath and difficulty breathing.   Cardiovascular:  Positive for swelling in legs/feet. Negative for chest pain and palpitations.  Gastrointestinal:  Negative for abdominal pain, blood in stool, constipation and diarrhea.  Endocrine: Negative for increased urination.  Genitourinary:  Negative for painful urination.  Musculoskeletal:  Positive for joint pain, joint pain, joint swelling and morning stiffness. Negative for myalgias, muscle weakness, muscle tenderness and myalgias.  Skin:  Positive for color change. Negative for rash, redness and sensitivity to sunlight.  Allergic/Immunologic: Negative for susceptible to infections.  Neurological:  Positive for numbness, memory loss and weakness. Negative for dizziness and headaches.  Hematological:  Negative for swollen glands.  Psychiatric/Behavioral:  Positive for sleep disturbance. Negative for behavioral problems and confusion. The patient is not nervous/anxious.    PMFS History:  Patient Active Problem List   Diagnosis Date Noted   Acute pain of right knee 08/09/2020   Pneumonia due to COVID-19 virus 01/17/2020   Hypokalemia 01/17/2020   Left shoulder pain 10/23/2019   Decreased ROM of left shoulder 10/23/2019   Coronary  artery disease involving native coronary artery of native heart with angina pectoris (HCC) 05/22/2019   Hyperlipidemia with target LDL less than 70 -> CAD 02/12/2019   Presence of drug coated stent in LAD coronary artery 02/11/2019   History of non-ST elevation myocardial infarction (NSTEMI) 02/10/2019   Cerebral infarction, remote, resolved 06/27/2018   Pulmonary nodules 12/08/2015   Draining cutaneous sinus tract 07/15/2015   Sleep apnea 04/06/2015    Pulmonary emphysema (HCC) 03/02/2015   Smoker 02/16/2015   Seizure disorder (HCC) 01/21/2015   Focal epilepsy with impairment of consciousness (HCC) 12/13/2014   Seizures (HCC) 04/18/2014   Essential hypertension 04/18/2014   Preretinal fibrosis, 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   Tobacco use disorder 07/19/2012    Past Medical History:  Diagnosis Date   Asthma    Bursitis    Hip   CAD (coronary artery disease) 02/11/2019   PCI/DES x1 to mLAD, focal ostial 70% lesion in 1st diag   COPD (chronic obstructive pulmonary disease) (HCC)    Essential hypertension    GERD (gastroesophageal reflux disease)    Headache(784.0)    Hyperlipidemia    NSTEMI (non-ST elevated myocardial infarction) (HCC) 02/11/2019   Pneumonia    Seizure (HCC)    Sleep apnea     Family History  Problem Relation Age of Onset   COPD Mother    Diabetes Father    Hypertension Father    Hyperlipidemia Father    Cancer Sister        Bone Cancer   Diabetes Brother    Cancer Brother    Cancer Paternal Uncle    Stroke Paternal Grandmother    Healthy Daughter    Healthy Son    Colon cancer Neg Hx    Past Surgical History:  Procedure Laterality Date   ABDOMINAL HYSTERECTOMY     APPENDECTOMY     CATARACT EXTRACTION Bilateral    CHOLECYSTECTOMY     CORONARY STENT INTERVENTION N/A 02/11/2019   Procedure: CORONARY STENT INTERVENTION;  Surgeon: Kathleene Hazel, MD;  Location: [facility];; mid LAD 70% -- DES PCI (Synergy DES 2.5 x 28 --2.8 mm)   heal spur     right   INTRAVASCULAR PRESSURE WIRE/FFR STUDY N/A 02/11/2019   Procedure: INTRAVASCULAR PRESSURE WIRE/FFR STUDY;  Surgeon: Kathleene Hazel, MD;  Location: [facility];  Service: Cardiovascular;  Laterality: N/A;   LASER PHOTO ABLATION Right 08/27/2013   Procedure: LASER PHOTO ABLATION;  Surgeon: Sherrie George, MD;  Location: [facility] OR;  Service: Ophthalmology;  Laterality: Right;  Headscope laser AND Endolaser   LEFT HEART CATH AND  CORONARY ANGIOGRAPHY N/A 02/11/2019   Procedure: LEFT HEART CATH AND CORONARY ANGIOGRAPHY;  Surgeon: Kathleene Hazel, MD;  Location: [facility];;; prox RCA 20%. Ost-prox Cx 20%. ostial D1 70% & mLAD 70% => (DES PCI of LAD)   MEMBRANE PEEL Right 08/27/2013   Procedure: MEMBRANE PEEL;  Surgeon: Sherrie George, MD;  Location: [facility] OR;  Service: Ophthalmology;  Laterality: Right;   PARS PLANA VITRECTOMY Right 08/27/2013   Procedure: PARS PLANA VITRECTOMY WITH 25 GAUGE;  Surgeon: Sherrie George, MD;  Location: [facility] OR;  Service: Ophthalmology;  Laterality: Right;   Social History   Social History Narrative   Patient lives at home with family.   Caffeine Use: 1 pot and a half a day   Immunization History  Administered Date(s) Administered   MMR 08/26/2007   Td 08/11/2007   Tdap 08/12/2014  Objective: Vital Signs: BP (!) 143/77 (BP Location: Right Arm, Patient Position: Sitting, Cuff Size: Large)   Pulse 62   Ht 5' 5.5" (1.664 m)   Wt 237 lb (107.5 kg)   BMI 38.84 kg/m    Physical Exam Vitals and nursing note reviewed.  Constitutional:      Appearance: She is well-developed.  HENT:     Head: Normocephalic and atraumatic.  Eyes:     Conjunctiva/sclera: Conjunctivae normal.  Cardiovascular:     Rate and Rhythm: Normal rate and regular rhythm.     Heart sounds: Normal heart sounds.  Pulmonary:     Effort: Pulmonary effort is normal.     Breath sounds: Normal breath sounds.  Abdominal:     General: Bowel sounds are normal.     Palpations: Abdomen is soft.  Musculoskeletal:     Cervical back: Normal range of motion.  Lymphadenopathy:     Cervical: No cervical adenopathy.  Skin:    General: Skin is warm and dry.     Capillary Refill: Capillary refill takes less than 2 seconds.  Neurological:     Mental Status: She is alert and oriented to person, place, and time.  Psychiatric:        Behavior: Behavior normal.     Musculoskeletal Exam: C-spine was in good  range of motion.  Shoulder joints and elbow joints with good range of motion.  She had no synovitis or tenderness over wrist joints or MCPs.  Bilateral PIP and DIP thickening was noted.  Hip joints with good range of motion.  She has painful limited range of motion of her knee joints which are warm and swollen.  She had no tenderness over ankles or MTPs.  CDAI Exam: CDAI Score: 5.6  Patient Global: 8 mm; Provider Global: 8 mm Swollen: 2 ; Tender: 2  Joint Exam 09/15/2020      Right  Left  Knee  Swollen Tender  Swollen Tender     Investigation: No additional findings.  Imaging: No results found.  Recent Labs: Lab Results  Component Value Date   WBC 9.4 06/24/2020   HGB 13.1 06/24/2020   PLT 283 06/24/2020   NA 139 04/11/2020   K 4.2 04/11/2020   CL 105 04/11/2020   CO2 26 04/11/2020   GLUCOSE 141 (H) 04/11/2020   BUN 11 04/11/2020   CREATININE 0.83 04/11/2020   BILITOT 0.2 03/03/2020   ALKPHOS 114 03/03/2020   AST 10 03/03/2020   ALT 5 03/03/2020   PROT 6.7 03/03/2020   ALBUMIN 4.2 03/03/2020   CALCIUM 9.2 04/11/2020   GFRAA 87 03/03/2020    Speciality Comments: No specialty comments available.  Procedures:  Large Joint Inj: bilateral knee on 09/15/2020 11:01 AM Indications: pain Details: 27 G 1.5 in needle, medial approach  Arthrogram: No  Medications (Right): 1.5 mL lidocaine 1 %; 40 mg triamcinolone acetonide 40 MG/ML Aspirate (Right): 0 mL Medications (Left): 1.5 mL lidocaine 1 %; 40 mg triamcinolone acetonide 40 MG/ML Aspirate (Left): 0 mL Outcome: tolerated well, no immediate complications Procedure, treatment alternatives, risks and benefits explained, specific risks discussed. Consent was given by the patient. Immediately prior to procedure a time out was called to verify the correct patient, procedure, equipment, support staff and site/side marked as required. Patient was prepped and draped in the usual sterile fashion.    Allergies: Penicillins, Bee  venom, Prednisone, Amoxicillin-pot clavulanate, and Doxycycline hyclate   Assessment / Plan:     Visit Diagnoses: Rheumatoid arthritis  involving multiple sites with positive rheumatoid factor ([place]) - 06/24/20: RF 57.6, anti-CCP >250, uric acid 5.5, ESR 13, ANA negative.  Patient gives history of recurrent pain and swelling in the bilateral knee joints since February 2022.  She has had several aspirations and injections.  She presents today with warmth and swelling in the bilateral knee joints.  She is limping and has difficulty walking.  We discussed possible use of methotrexate in the future if her labs are normal.  I will give her a prednisone taper starting at 10 mg and taper by 5 mg in 10 days.  Side effects of prednisone were discussed with the patient and her daughter.  This will give her a bridging therapy until methotrexate can be started.  Pain in both hands -she complains of discomfort in her hands.  No warmth swelling or effusion was noted.  She had bilateral PIP and DIP thickening.  Plan: XR Hand 2 View Right, XR Hand 2 View Left.  X-rays were consistent with osteoarthritis.  Chronic pain of both knees-she complains of pain and discomfort in the bilateral knee joints to the point she has been using a cane and difficulty walking.  She has warmth and swelling in bilateral knee joints.  After different treatment options were discussed bilateral knee joints were injected with cortisone as described above.  She tolerated the procedure well.  Postprocedure instructions were given.  Primary osteoarthritis of both knees-I reviewed her x-rays which showed bilateral severe osteoarthritis and bilateral severe chondromalacia patella.  She will require total knee replacement in the future.  Pain in both feet -she has some discomfort in her feet.  No warmth swelling or effusion was noted.  Plan: XR Foot 2 Views Right, XR Foot 2 Views Left.  X-ray of bilateral feet were consistent with osteoarthritis.  High  risk medication use -in anticipation to start her on the future treatment I will obtain following labs today.  Plan: CBC with Differential/Platelet, COMPLETE METABOLIC PANEL WITH GFR, Hepatitis B core antibody, IgM, Hepatitis B surface antigen, Hepatitis C antibody, QuantiFERON-TB Gold Plus, Serum protein electrophoresis with reflex, IgG, IgA, IgM  DDD (degenerative disc disease), cervical-she had good range of motion of her cervical spine.  Other fatigue -she complains of fatigue.  Plan: CK  Essential hypertension  Coronary artery disease involving native coronary artery of native heart with angina pectoris (HCC)  History of non-ST elevation myocardial infarction (NSTEMI)  Presence of drug coated stent in LAD coronary artery  Hyperlipidemia with target LDL less than 70 -> CAD  Focal epilepsy with impairment of consciousness (HCC)  Cerebral infarction, remote, resolved  History of emphysema  Pulmonary nodules  Former smoker - 1PPDx 45years, quit at age 56.  Preretinal fibrosis, right eye  Orders: Orders Placed This Encounter  Procedures   Large Joint Inj   XR Hand 2 View Right   XR Hand 2 View Left   XR Foot 2 Views Right   XR Foot 2 Views Left   CBC with Differential/Platelet   COMPLETE METABOLIC PANEL WITH GFR   CK   Hepatitis B core antibody, IgM   Hepatitis B surface antigen   Hepatitis C antibody   QuantiFERON-TB Gold Plus   Serum protein electrophoresis with reflex   IgG, IgA, IgM    No orders of the defined types were placed in this encounter.    Follow-Up Instructions: Return for Rheumatoid arthritis.   Bo Merino, MD  Note - This record has been created using Editor, commissioning.  Chart creation errors have been sought, but may not always  have been located. Such creation errors do not reflect on  the standard of medical care.

## 2020-09-15 ENCOUNTER — Ambulatory Visit (INDEPENDENT_AMBULATORY_CARE_PROVIDER_SITE_OTHER): Payer: Medicare Other | Admitting: Rheumatology

## 2020-09-15 ENCOUNTER — Ambulatory Visit: Payer: Self-pay

## 2020-09-15 ENCOUNTER — Encounter: Payer: Self-pay | Admitting: Rheumatology

## 2020-09-15 ENCOUNTER — Other Ambulatory Visit: Payer: Self-pay

## 2020-09-15 VITALS — BP 143/77 | HR 62 | Ht 65.5 in | Wt 237.0 lb

## 2020-09-15 DIAGNOSIS — I25119 Atherosclerotic heart disease of native coronary artery with unspecified angina pectoris: Secondary | ICD-10-CM | POA: Diagnosis not present

## 2020-09-15 DIAGNOSIS — G40209 Localization-related (focal) (partial) symptomatic epilepsy and epileptic syndromes with complex partial seizures, not intractable, without status epilepticus: Secondary | ICD-10-CM

## 2020-09-15 DIAGNOSIS — M79671 Pain in right foot: Secondary | ICD-10-CM

## 2020-09-15 DIAGNOSIS — M25561 Pain in right knee: Secondary | ICD-10-CM | POA: Diagnosis not present

## 2020-09-15 DIAGNOSIS — M170 Bilateral primary osteoarthritis of knee: Secondary | ICD-10-CM

## 2020-09-15 DIAGNOSIS — M0579 Rheumatoid arthritis with rheumatoid factor of multiple sites without organ or systems involvement: Secondary | ICD-10-CM

## 2020-09-15 DIAGNOSIS — I252 Old myocardial infarction: Secondary | ICD-10-CM | POA: Diagnosis not present

## 2020-09-15 DIAGNOSIS — F17200 Nicotine dependence, unspecified, uncomplicated: Secondary | ICD-10-CM

## 2020-09-15 DIAGNOSIS — I209 Angina pectoris, unspecified: Secondary | ICD-10-CM

## 2020-09-15 DIAGNOSIS — M25612 Stiffness of left shoulder, not elsewhere classified: Secondary | ICD-10-CM

## 2020-09-15 DIAGNOSIS — Z955 Presence of coronary angioplasty implant and graft: Secondary | ICD-10-CM | POA: Diagnosis not present

## 2020-09-15 DIAGNOSIS — M5030 Other cervical disc degeneration, unspecified cervical region: Secondary | ICD-10-CM | POA: Diagnosis not present

## 2020-09-15 DIAGNOSIS — M79672 Pain in left foot: Secondary | ICD-10-CM | POA: Diagnosis not present

## 2020-09-15 DIAGNOSIS — M79642 Pain in left hand: Secondary | ICD-10-CM

## 2020-09-15 DIAGNOSIS — I10 Essential (primary) hypertension: Secondary | ICD-10-CM

## 2020-09-15 DIAGNOSIS — E785 Hyperlipidemia, unspecified: Secondary | ICD-10-CM

## 2020-09-15 DIAGNOSIS — M25562 Pain in left knee: Secondary | ICD-10-CM | POA: Diagnosis not present

## 2020-09-15 DIAGNOSIS — Z8673 Personal history of transient ischemic attack (TIA), and cerebral infarction without residual deficits: Secondary | ICD-10-CM

## 2020-09-15 DIAGNOSIS — G8929 Other chronic pain: Secondary | ICD-10-CM

## 2020-09-15 DIAGNOSIS — J439 Emphysema, unspecified: Secondary | ICD-10-CM

## 2020-09-15 DIAGNOSIS — M79641 Pain in right hand: Secondary | ICD-10-CM | POA: Diagnosis not present

## 2020-09-15 DIAGNOSIS — Z8709 Personal history of other diseases of the respiratory system: Secondary | ICD-10-CM

## 2020-09-15 DIAGNOSIS — R5383 Other fatigue: Secondary | ICD-10-CM

## 2020-09-15 DIAGNOSIS — R918 Other nonspecific abnormal finding of lung field: Secondary | ICD-10-CM

## 2020-09-15 DIAGNOSIS — Z87891 Personal history of nicotine dependence: Secondary | ICD-10-CM

## 2020-09-15 DIAGNOSIS — Z79899 Other long term (current) drug therapy: Secondary | ICD-10-CM | POA: Diagnosis not present

## 2020-09-15 DIAGNOSIS — H35371 Puckering of macula, right eye: Secondary | ICD-10-CM

## 2020-09-15 DIAGNOSIS — R768 Other specified abnormal immunological findings in serum: Secondary | ICD-10-CM

## 2020-09-15 MED ORDER — PREDNISONE 5 MG PO TABS: ORAL_TABLET | ORAL | 0 refills | Status: AC

## 2020-09-15 MED ORDER — LIDOCAINE HCL 1 % IJ SOLN
1.5000 mL | INTRAMUSCULAR | Status: AC | PRN
  Administered 2020-09-15: 1.5 mL

## 2020-09-15 MED ORDER — TRIAMCINOLONE ACETONIDE 40 MG/ML IJ SUSP
40.0000 mg | INTRAMUSCULAR | Status: AC | PRN
  Administered 2020-09-15: 40 mg via INTRA_ARTICULAR

## 2020-09-15 NOTE — Patient Instructions (Signed)
Methotrexate Tablets What is this medication? METHOTREXATE (METH oh TREX ate) treats inflammatory conditions such as arthritis and psoriasis. It works by decreasing inflammation, which can reduce pain and prevent long-term injury to the joints and skin. It may also be used to treat some types of cancer. It works by slowing down the growth of cancercells. This medicine may be used for other purposes; ask your health care provider orpharmacist if you have questions. COMMON BRAND NAME(S): Rheumatrex, Trexall What should I tell my care team before I take this medication? They need to know if you have any of these conditions: Fluid in the stomach area or lungs If you often drink alcohol Infection or immune system problems Kidney disease or on hemodialysis Liver disease Low blood counts, like low white cell, platelet, or red cell counts Lung disease Radiation therapy Stomach ulcers Ulcerative colitis An unusual or allergic reaction to methotrexate, other medications, foods, dyes, or preservatives Pregnant or trying to get pregnant Breast-feeding How should I use this medication? Take this medication by mouth with a glass of water. Follow the directions on the prescription label. Take your medication at regular intervals. Do not take it more often than directed. Do not stop taking except on your care team'sadvice. Make sure you know why you are taking this medication and how often you should take it. If this medication is used for a condition that is not cancer, like arthritis or psoriasis, it should be taken weekly, NOT daily. Taking thismedication more often than directed can cause serious side effects, even death. Talk to your care team about safe handling and disposal of this medication. Youmay need to take special precautions. Talk to your care team about the use of this medication in children. While thismedication may be prescribed for selected conditions, precautions do apply. Overdosage:  If you think you have taken too much of this medicine contact apoison control center or emergency room at once. NOTE: This medicine is only for you. Do not share this medicine with others. What if I miss a dose? If you miss a dose, talk with your care team. Do not take double or extra doses. What may interact with this medication? Do not take this medication with any of the following: Acitretin This medication may also interact with the following: Aspirin and aspirin-like medications including salicylates Azathioprine Certain antibiotics like penicillins, tetracycline, and chloramphenicol Certain medications that treat or prevent blood clots like warfarin, apixaban, dabigatran, and rivaroxaban Certain medications for stomach problems like esomeprazole, omeprazole, pantoprazole Cyclosporine Dapsone Diuretics Gold Hydroxychloroquine Live virus vaccines Medications for infection like acyclovir, adefovir, amphotericin B, bacitracin, cidofovir, foscarnet, ganciclovir, gentamicin, pentamidine, vancomycin Mercaptopurine NSAIDs, medications for pain and inflammation, like ibuprofen or naproxen Other cytotoxic agents Pamidronate Pemetrexed Penicillamine Phenylbutazone Phenytoin Probenecid Pyrimethamine Retinoids such as isotretinoin and tretinoin Steroid medications like prednisone or cortisone Sulfonamides like sulfasalazine and trimethoprim/sulfamethoxazole Theophylline Zoledronic acid This list may not describe all possible interactions. Give your health care provider a list of all the medicines, herbs, non-prescription drugs, or dietary supplements you use. Also tell them if you smoke, drink alcohol, or use illegaldrugs. Some items may interact with your medicine. What should I watch for while using this medication? Avoid alcoholic drinks. This medication can make you more sensitive to the sun. Keep out of the sun. If you cannot avoid being in the sun, wear protective clothing and  use sunscreen.Do not use sun lamps or tanning beds/booths. You may need blood work done while you are taking this medication.  Call your care team for advice if you get a fever, chills or sore throat, or other symptoms of a cold or flu. Do not treat yourself. This medication decreases your body's ability to fight infections. Try to avoid being aroundpeople who are sick. This medication may increase your risk to bruise or bleed. Call your care teamif you notice any unusual bleeding. Be careful brushing or flossing your teeth or using a toothpick because you may get an infection or bleed more easily. If you have any dental work done, Primary school teacher you are receiving this medication. Check with your care team if you get an attack of severe diarrhea, nausea and vomiting, or if you sweat a lot. The loss of too much body fluid can make itdangerous for you to take this medication. Talk to your care team about your risk of cancer. You may be more at risk forcertain types of cancers if you take this medication. Do not become pregnant while taking this medication or for 6 months after stopping it. Women should inform their care team if they wish to become pregnant or think they might be pregnant. Men should not father a child while taking this medication and for 3 months after stopping it. There is potential for serious harm to an unborn child. Talk to your care team for more information. Do not breast-feed an infant while taking this medication or for 1week after stopping it. This medication may make it more difficult to get pregnant or father a child.Talk to your care team if you are concerned about your fertility. What side effects may I notice from receiving this medication? Side effects that you should report to your care team as soon as possible: Allergic reactions-skin rash, itching, hives, swelling of the face, lips, tongue, or throat Blood clot-pain, swelling, or warmth in the leg, shortness of breath,  chest pain Dry cough, shortness of breath or trouble breathing Infection-fever, chills, cough, sore throat, wounds that don't heal, pain or trouble when passing urine, general feeling of discomfort or being unwell Kidney injury-decrease in the amount of urine, swelling of the ankles, hands, or feet Liver injury-right upper belly pain, loss of appetite, nausea, light-colored stool, dark yellow or brown urine, yellowing of the skin or eyes, unusual weakness or fatigue Low red blood cell count-unusual weakness or fatigue, dizziness, headache, trouble breathing Redness, blistering, peeling, or loosening of the skin, including inside the mouth Seizures Unusual bruising or bleeding Side effects that usually do not require medical attention (report to your careteam if they continue or are bothersome): Diarrhea Dizziness Hair loss Nausea Pain, redness, or swelling with sores inside the mouth or throat Vomiting This list may not describe all possible side effects. Call your doctor for medical advice about side effects. You may report side effects to FDA at1-800-FDA-1088. Where should I keep my medication? Keep out of the reach of children and pets. Store at room temperature between 20 and 25 degrees C (68 and 77 degrees F).Protect from light. Get rid of any unused medication after the expiration date. Talk to your care team about how to dispose of unused medication. Specialdirections may apply. NOTE: This sheet is a summary. It may not cover all possible information. If you have questions about this medicine, talk to your doctor, pharmacist, orhealth care provider.  2022 Elsevier/Gold Standard (2020-03-07 15:55:45)

## 2020-09-16 ENCOUNTER — Telehealth: Payer: Self-pay | Admitting: *Deleted

## 2020-09-16 DIAGNOSIS — R768 Other specified abnormal immunological findings in serum: Secondary | ICD-10-CM

## 2020-09-16 NOTE — Progress Notes (Signed)
Hepatitis C antibody is reactive.  Please have patient come in for hepatitis C RNA quant.

## 2020-09-16 NOTE — Telephone Encounter (Signed)
-----   Message from Bo Merino, MD sent at 09/16/2020  1:20 PM EDT ----- Hepatitis C antibody is reactive.  Please have patient come in for hepatitis C RNA quant.

## 2020-09-20 NOTE — Progress Notes (Signed)
Hepatitis C is negative.  All other labs were also negative.  I will discuss results at the follow-up visit.

## 2020-09-21 LAB — IFE INTERPRETATION: Immunofix Electr Int: NOT DETECTED

## 2020-09-21 LAB — CBC WITH DIFFERENTIAL/PLATELET
Absolute Monocytes: 706 cells/uL (ref 200–950)
Basophils Absolute: 50 cells/uL (ref 0–200)
Basophils Relative: 0.6 %
Eosinophils Absolute: 83 cells/uL (ref 15–500)
Eosinophils Relative: 1 %
HCT: 40.8 % (ref 35.0–45.0)
Hemoglobin: 13.4 g/dL (ref 11.7–15.5)
Lymphs Abs: 2390 cells/uL (ref 850–3900)
MCH: 31.8 pg (ref 27.0–33.0)
MCHC: 32.8 g/dL (ref 32.0–36.0)
MCV: 96.7 fL (ref 80.0–100.0)
MPV: 10.8 fL (ref 7.5–12.5)
Monocytes Relative: 8.5 %
Neutro Abs: 5071 cells/uL (ref 1500–7800)
Neutrophils Relative %: 61.1 %
Platelets: 249 10*3/uL (ref 140–400)
RBC: 4.22 10*6/uL (ref 3.80–5.10)
RDW: 12.9 % (ref 11.0–15.0)
Total Lymphocyte: 28.8 %
WBC: 8.3 10*3/uL (ref 3.8–10.8)

## 2020-09-21 LAB — COMPLETE METABOLIC PANEL WITH GFR
AG Ratio: 1.6 (calc) (ref 1.0–2.5)
ALT: 7 U/L (ref 6–29)
AST: 17 U/L (ref 10–35)
Albumin: 4.2 g/dL (ref 3.6–5.1)
Alkaline phosphatase (APISO): 95 U/L (ref 37–153)
BUN: 10 mg/dL (ref 7–25)
CO2: 25 mmol/L (ref 20–32)
Calcium: 9.4 mg/dL (ref 8.6–10.4)
Chloride: 103 mmol/L (ref 98–110)
Creat: 0.92 mg/dL (ref 0.50–1.05)
Globulin: 2.7 g/dL (calc) (ref 1.9–3.7)
Glucose, Bld: 119 mg/dL — ABNORMAL HIGH (ref 65–99)
Potassium: 4.2 mmol/L (ref 3.5–5.3)
Sodium: 138 mmol/L (ref 135–146)
Total Bilirubin: 0.5 mg/dL (ref 0.2–1.2)
Total Protein: 6.9 g/dL (ref 6.1–8.1)
eGFR: 70 mL/min/{1.73_m2} (ref >=60)

## 2020-09-21 LAB — IGG, IGA, IGM
IgG (Immunoglobin G), Serum: 1032 mg/dL (ref 600–1540)
IgM, Serum: 32 mg/dL — ABNORMAL LOW (ref 50–300)
Immunoglobulin A: 299 mg/dL (ref 70–320)

## 2020-09-21 LAB — PROTEIN ELECTROPHORESIS, SERUM, WITH REFLEX
Albumin ELP: 4.1 g/dL (ref 3.8–4.8)
Alpha 1: 0.3 g/dL (ref 0.2–0.3)
Alpha 2: 0.9 g/dL (ref 0.5–0.9)
Beta 2: 0.4 g/dL (ref 0.2–0.5)
Beta Globulin: 0.5 g/dL (ref 0.4–0.6)
Gamma Globulin: 0.9 g/dL (ref 0.8–1.7)
Total Protein: 7.1 g/dL (ref 6.1–8.1)

## 2020-09-21 LAB — HEPATITIS B CORE ANTIBODY, IGM: Hep B C IgM: NONREACTIVE

## 2020-09-21 LAB — QUANTIFERON-TB GOLD PLUS
Mitogen-NIL: 2.15 IU/mL
NIL: 0.02 IU/mL
QuantiFERON-TB Gold Plus: NEGATIVE
TB1-NIL: 0 IU/mL
TB2-NIL: <0 IU/mL

## 2020-09-21 LAB — HEPATITIS C ANTIBODY
Hepatitis C Ab: REACTIVE — AB
SIGNAL TO CUT-OFF: 3.86 — ABNORMAL HIGH (ref <1.00)

## 2020-09-21 LAB — HCV RNA,QUANTITATIVE REAL TIME PCR
HCV Quantitative Log: <1.18 Log IU/mL
HCV RNA, PCR, QN: <15 IU/mL

## 2020-09-21 LAB — CK: Total CK: 63 U/L (ref 29–143)

## 2020-09-21 LAB — HEPATITIS B SURFACE ANTIGEN: Hepatitis B Surface Ag: NONREACTIVE

## 2020-09-27 ENCOUNTER — Emergency Department (HOSPITAL_COMMUNITY): Payer: Medicare Other

## 2020-09-27 ENCOUNTER — Telehealth: Payer: Self-pay | Admitting: *Deleted

## 2020-09-27 ENCOUNTER — Encounter (HOSPITAL_COMMUNITY): Payer: Self-pay | Admitting: Emergency Medicine

## 2020-09-27 ENCOUNTER — Other Ambulatory Visit: Payer: Self-pay

## 2020-09-27 ENCOUNTER — Emergency Department (HOSPITAL_COMMUNITY)
Admission: EM | Admit: 2020-09-27 | Discharge: 2020-09-27 | Disposition: A | Discharge status: Home / Self Care | Payer: Medicare Other | Attending: Emergency Medicine | Admitting: Emergency Medicine

## 2020-09-27 DIAGNOSIS — J449 Chronic obstructive pulmonary disease, unspecified: Secondary | ICD-10-CM | POA: Insufficient documentation

## 2020-09-27 DIAGNOSIS — R059 Cough, unspecified: Secondary | ICD-10-CM

## 2020-09-27 DIAGNOSIS — Z87891 Personal history of nicotine dependence: Secondary | ICD-10-CM | POA: Insufficient documentation

## 2020-09-27 DIAGNOSIS — Z79899 Other long term (current) drug therapy: Secondary | ICD-10-CM | POA: Insufficient documentation

## 2020-09-27 DIAGNOSIS — R61 Generalized hyperhidrosis: Secondary | ICD-10-CM | POA: Diagnosis not present

## 2020-09-27 DIAGNOSIS — Z20822 Contact with and (suspected) exposure to covid-19: Secondary | ICD-10-CM | POA: Insufficient documentation

## 2020-09-27 DIAGNOSIS — J45909 Unspecified asthma, uncomplicated: Secondary | ICD-10-CM | POA: Insufficient documentation

## 2020-09-27 DIAGNOSIS — I10 Essential (primary) hypertension: Secondary | ICD-10-CM | POA: Insufficient documentation

## 2020-09-27 DIAGNOSIS — R0602 Shortness of breath: Secondary | ICD-10-CM | POA: Insufficient documentation

## 2020-09-27 DIAGNOSIS — Z7982 Long term (current) use of aspirin: Secondary | ICD-10-CM | POA: Insufficient documentation

## 2020-09-27 DIAGNOSIS — R079 Chest pain, unspecified: Secondary | ICD-10-CM | POA: Insufficient documentation

## 2020-09-27 DIAGNOSIS — I25118 Atherosclerotic heart disease of native coronary artery with other forms of angina pectoris: Secondary | ICD-10-CM | POA: Diagnosis not present

## 2020-09-27 DIAGNOSIS — R0789 Other chest pain: Secondary | ICD-10-CM | POA: Diagnosis not present

## 2020-09-27 DIAGNOSIS — Z8616 Personal history of COVID-19: Secondary | ICD-10-CM | POA: Diagnosis not present

## 2020-09-27 DIAGNOSIS — R110 Nausea: Secondary | ICD-10-CM | POA: Insufficient documentation

## 2020-09-27 LAB — BASIC METABOLIC PANEL
Anion gap: 5 (ref 5–15)
BUN: 14 mg/dL (ref 8–23)
CO2: 27 mmol/L (ref 22–32)
Calcium: 9 mg/dL (ref 8.9–10.3)
Chloride: 102 mmol/L (ref 98–111)
Creatinine, Ser: 0.88 mg/dL (ref 0.44–1.00)
GFR, Estimated: >60 mL/min (ref >60)
Glucose, Bld: 252 mg/dL — ABNORMAL HIGH (ref 70–99)
Potassium: 3.9 mmol/L (ref 3.5–5.1)
Sodium: 134 mmol/L — ABNORMAL LOW (ref 135–145)

## 2020-09-27 LAB — RESP PANEL BY RT-PCR (FLU A&B, COVID) ARPGX2
Influenza A by PCR: NEGATIVE
Influenza B by PCR: NEGATIVE
SARS Coronavirus 2 by RT PCR: NEGATIVE

## 2020-09-27 LAB — TROPONIN I (HIGH SENSITIVITY)
Troponin I (High Sensitivity): 3 ng/L (ref <18)
Troponin I (High Sensitivity): 3 ng/L (ref <18)

## 2020-09-27 LAB — CBC
HCT: 40.9 % (ref 36.0–46.0)
Hemoglobin: 13.3 g/dL (ref 12.0–15.0)
MCH: 32.7 pg (ref 26.0–34.0)
MCHC: 32.5 g/dL (ref 30.0–36.0)
MCV: 100.5 fL — ABNORMAL HIGH (ref 80.0–100.0)
Platelets: 221 10*3/uL (ref 150–400)
RBC: 4.07 MIL/uL (ref 3.87–5.11)
RDW: 14.2 % (ref 11.5–15.5)
WBC: 11.1 10*3/uL — ABNORMAL HIGH (ref 4.0–10.5)
nRBC: 0 % (ref 0.0–0.2)

## 2020-09-27 MED ORDER — AZITHROMYCIN 250 MG PO TABS: ORAL_TABLET | ORAL | 0 refills | Status: DC

## 2020-09-27 NOTE — Telephone Encounter (Signed)
Patients daughter called in and states that her mother is having chest pain with SOB. Patient daughter aware that she needs to go straight to the ER with patients cardiac history. I also spoke with Monia Pouch, FNP and she agrees with this plan.

## 2020-09-27 NOTE — ED Triage Notes (Signed)
Pt c/o chest pain that started about 1230. Pt also c/o left arm pain and numbness.

## 2020-09-27 NOTE — Discharge Instructions (Addendum)
As discussed, please call Dr. Myles Gip office tomorrow to arrange a follow-up appointment for this week.  I have prescribed an antibiotic for you to take for likely bronchitis.  Please return to the emergency department if you develop any new or worsening symptoms.

## 2020-09-27 NOTE — ED Provider Notes (Signed)
[facility] EMERGENCY DEPARTMENT Provider Note   CSN: [id number] Arrival date & time: 09/27/20  1535     History Chief Complaint  Patient presents with   Chest Pain    Courtney Mcconnell is a 65 y.o. female.   Chest Pain Associated symptoms: cough, diaphoresis, nausea and shortness of breath   Associated symptoms: no abdominal pain, no back pain, no dizziness, no fever, no headache, no palpitations, no vomiting and no weakness       Courtney LATASCHA Mcconnell is a 65 y.o. female with past medical history of coronary artery disease, COPD, hypertension, hyperlipidemia and previous NSTEMI and stent placement 2020.  She presents to the Emergency Department complaining of shortness of breath since 6:00 pm last evening.  mid upper chest pain onset around noon today. Initially, pain lasted one hour then improved. Pain returned shortly after this time lasting 45 minutes and improved by time she arrived here.  Second episode of pain was associated with pins and needle sensation that radiated down her left arm.  She describes the pain as dull and "pressure-like" shortness of breath worse with exertion.    She is concerned that she may have pneumonia as she also endorses a slight cough.  No fever or chills.  Cough is nonproductive.  No known COVID exposures.  She did not take ASA or NTG prior to arrival.    Past Medical History:  Diagnosis Date   Asthma    Bursitis    Hip   CAD (coronary artery disease) 02/11/2019   PCI/DES x1 to mLAD, focal ostial 70% lesion in 1st diag   COPD (chronic obstructive pulmonary disease) (HCC)    Essential hypertension    GERD (gastroesophageal reflux disease)    Headache(784.0)    Hyperlipidemia    NSTEMI (non-ST elevated myocardial infarction) ([place]) 02/11/2019   Pneumonia    Seizure ([place])    Sleep apnea     Patient Active Problem List   Diagnosis Date Noted   Acute pain of right knee 08/09/2020   Pneumonia due to COVID-19 virus 01/17/2020   Hypokalemia 01/17/2020    Left shoulder pain 10/23/2019   Decreased ROM of left shoulder 10/23/2019   Coronary artery disease involving native coronary artery of native heart with angina pectoris ([place]) 05/22/2019   Hyperlipidemia with target LDL less than 70 -> CAD 02/12/2019   Presence of drug coated stent in LAD coronary artery 02/11/2019   History of non-ST elevation myocardial infarction (NSTEMI) 02/10/2019   Cerebral infarction, remote, resolved 06/27/2018   Pulmonary nodules 12/08/2015   Draining cutaneous sinus tract 07/15/2015   Sleep apnea 04/06/2015   Pulmonary emphysema ([place]) 03/02/2015   Smoker 02/16/2015   Seizure disorder ([place]) 01/21/2015   Focal epilepsy with impairment of consciousness ([place]) 12/13/2014   Seizures ([place]) 04/18/2014   Essential hypertension 04/18/2014   Preretinal fibrosis, 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   Tobacco use disorder 07/19/2012    Past Surgical History:  Procedure Laterality Date   ABDOMINAL HYSTERECTOMY     APPENDECTOMY     CATARACT EXTRACTION Bilateral    CHOLECYSTECTOMY     CORONARY STENT INTERVENTION N/A 02/11/2019   Procedure: CORONARY STENT INTERVENTION;  Surgeon: Burnell Blanks, MD;  Location: [facility] INVASIVE [facility] LAB;; mid LAD 70% -- DES PCI (Synergy DES 2.5 x 28 --2.8 mm)   heal spur     right   INTRAVASCULAR PRESSURE WIRE/FFR STUDY N/A 02/11/2019   Procedure: INTRAVASCULAR PRESSURE WIRE/FFR STUDY;  Surgeon: Burnell Blanks, MD;  Location: [place] [facility];  Service: Cardiovascular;  Laterality: N/A;   LASER PHOTO ABLATION Right 08/27/2013   Procedure: LASER PHOTO ABLATION;  Surgeon: Hayden Pedro, MD;  Location: [place];  Service: Ophthalmology;  Laterality: Right;  Headscope laser AND Endolaser   LEFT HEART CATH AND CORONARY ANGIOGRAPHY N/A 02/11/2019   Procedure: LEFT HEART CATH AND CORONARY ANGIOGRAPHY;  Surgeon: Burnell Blanks, MD;  Location: [place] INVASIVE CV LAB;;; prox RCA 20%. Ost-prox Cx 20%. ostial D1 70% & mLAD 70% => (DES PCI of LAD)    MEMBRANE PEEL Right 08/27/2013   Procedure: MEMBRANE PEEL;  Surgeon: Hayden Pedro, MD;  Location: [place];  Service: Ophthalmology;  Laterality: Right;   PARS PLANA VITRECTOMY Right 08/27/2013   Procedure: PARS PLANA VITRECTOMY WITH 25 GAUGE;  Surgeon: Hayden Pedro, MD;  Location: [place];  Service: Ophthalmology;  Laterality: Right;     OB History     Gravida  2   Para  2   Term  2   Preterm      AB      Living  3      SAB      IAB      Ectopic      Multiple      Live Births              Family History  Problem Relation Age of Onset   COPD Mother    Diabetes Father    Hypertension Father    Hyperlipidemia Father    Cancer Sister        Bone Cancer   Diabetes Brother    Cancer Brother    Cancer Paternal Uncle    Stroke Paternal Grandmother    Healthy Daughter    Healthy Son    Colon cancer Neg Hx     Social History   Tobacco Use   Smoking status: Former    Packs/day: 0.25    Years: 42.00    Pack years: 10.50    Types: Cigarettes    Quit date: 03/20/2014    Years since quitting: 6.5   Smokeless tobacco: Never   Tobacco comments:    01/21/15 restarted smoking 4 mos ago  Vaping Use   Vaping Use: Never used  Substance Use Topics   Alcohol use: No    Alcohol/week: 0.0 standard drinks   Drug use: No    Home Medications Prior to Admission medications   Medication Sig Start Date End Date Taking? Authorizing Provider  Acetaminophen (TYLENOL 8 HOUR ARTHRITIS PAIN PO) Take by mouth as needed.    [provider]  ADVAIR DISKUS 100-50 MCG/DOSE AEPB inhale ONE PUFF into THE lungs IN THE MORNING AND IN THE EVENING 05/16/20   Dettinger, Fransisca Kaufmann, MD  albuterol (VENTOLIN HFA) 108 (90 Base) MCG/ACT inhaler INHALE TWO PUFFS INTO THE LUNGS EVERY 6 HOURS AS NEEDED FOR WHEEZING OR SHORTNESS OF BREATH 06/10/20   Dettinger, Fransisca Kaufmann, MD  aspirin EC 81 MG EC tablet Take 1 tablet (81 mg total) by mouth daily. 02/12/19   Cheryln Manly, NP   atorvastatin (LIPITOR) 40 MG tablet TAKE 1 TABLET BY MOUTH DAILY AT 6PM 06/10/20   Leonie Man, MD  Biotin w/ Vitamins C & E (HAIR/SKIN/NAILS PO) Take by mouth.    [provider]  carvedilol (COREG) 3.125 MG tablet TAKE 1 TABLET BY MOUTH TWICE DAILY WITH A MEAL Patient taking differently: Take 3.125 mg by mouth 2 (two)  times daily with a meal. TAKE 1 TABLET BY MOUTH TWICE DAILY WITH A MEAL 11/19/19   Leonie Man, MD  diclofenac Sodium (VOLTAREN) 1 % GEL Apply 2 g topically 4 (four) times daily. 04/14/20   Dettinger, Fransisca Kaufmann, MD  Lacosamide (VIMPAT) 150 MG TABS Take 2 tablets by mouth 2 (two) times daily. 11/02/19   [provider]  losartan (COZAAR) 25 MG tablet TAKE 1 TABLET BY MOUTH EVERY DAY 05/17/20   Leonie Man, MD  meloxicam (MOBIC) 15 MG tablet Take 1 tablet (15 mg total) by mouth daily. 06/10/20   Dettinger, Fransisca Kaufmann, MD  nitroGLYCERIN (NITROSTAT) 0.4 MG SL tablet Place 1 tablet (0.4 mg total) under the tongue every 5 (five) minutes as needed for chest pain. 07/30/18   Dettinger, Fransisca Kaufmann, MD  omeprazole (PRILOSEC OTC) 20 MG tablet Take 20 mg by mouth daily.    [provider]  predniSONE (DELTASONE) 5 MG tablet Take 2 tablets (10 mg total) by mouth daily with breakfast for 10 days, THEN 1 tablet (5 mg total) daily with breakfast for 10 days. 09/15/20 10/05/20  Bo Merino, MD  tiotropium (SPIRIVA HANDIHALER) 18 MCG inhalation capsule Place 1 capsule (18 mcg total) into inhaler and inhale daily. 06/10/20   Dettinger, Fransisca Kaufmann, MD  zonisamide (ZONEGRAN) 100 MG capsule Take 2 capsules by mouth at bedtime. 07/18/20   [provider]    Allergies    Penicillins, Bee venom, Prednisone, Amoxicillin-pot clavulanate, and Doxycycline hyclate  Review of Systems   Review of Systems  Constitutional:  Positive for diaphoresis. Negative for activity change, appetite change and fever.  HENT:  Negative for congestion.   Respiratory:  Positive for cough and  shortness of breath. Negative for wheezing.   Cardiovascular:  Positive for chest pain. Negative for palpitations and leg swelling.  Gastrointestinal:  Positive for nausea. Negative for abdominal pain, diarrhea and vomiting.  Genitourinary:  Negative for difficulty urinating and flank pain.  Musculoskeletal:  Negative for back pain and neck pain.  Skin:  Negative for color change.  Neurological:  Negative for dizziness, syncope, weakness and headaches.   Physical Exam Updated Vital Signs BP (!) 148/81 (BP Location: Right Arm)   Pulse 68   Temp 98.9 F (37.2 C) (Oral)   Resp 19   Ht 5' 5.5" (1.664 m)   Wt 104.3 kg   SpO2 100%   BMI 37.69 kg/m   Physical Exam Vitals and nursing note reviewed.  Constitutional:      General: She is not in acute distress.    Appearance: Normal appearance. She is not toxic-appearing.  HENT:     Mouth/Throat:     Mouth: Mucous membranes are moist.  Cardiovascular:     Rate and Rhythm: Normal rate and regular rhythm.     Pulses: Normal pulses.  Pulmonary:     Effort: Pulmonary effort is normal. No respiratory distress.  Abdominal:     Palpations: Abdomen is soft.     Tenderness: There is no abdominal tenderness.  Musculoskeletal:        General: Normal range of motion.     Cervical back: Normal range of motion. No tenderness.     Right lower leg: No edema.     Left lower leg: No edema.  Skin:    General: Skin is warm.     Capillary Refill: Capillary refill takes less than 2 seconds.     Findings: No erythema.  Neurological:     General:  No focal deficit present.     Mental Status: She is alert.     Sensory: No sensory deficit.     Motor: No weakness.    ED Results / Procedures / Treatments   Labs (all labs ordered are listed, but only abnormal results are displayed) Labs Reviewed  BASIC METABOLIC PANEL - Abnormal; Notable for the following components:      Result Value   Sodium 134 (*)    Glucose, Bld 252 (*)    All other  components within normal limits  CBC - Abnormal; Notable for the following components:   WBC 11.1 (*)    MCV 100.5 (*)    All other components within normal limits  RESP PANEL BY RT-PCR (FLU A&B, COVID) [id number]  TROPONIN I (HIGH SENSITIVITY)  TROPONIN I (HIGH SENSITIVITY)    EKG EKG Interpretation  Date/Time:  Tuesday September 27 2020 15:48:48 EDT Ventricular Rate:  65 PR Interval:  182 QRS Duration: 96 QT Interval:  408 QTC Calculation: 424 R Axis:   -19 Text Interpretation: No significant change since last tracing Normal sinus rhythm Incomplete right bundle branch block Borderline ECG Confirmed by Fredia Sorrow [phone]) on 09/27/2020 7:54:39 PM  Radiology DG Chest 2 View  Result Date: 09/27/2020 CLINICAL DATA:  Chest pain.  Arm pain and shortness of breath EXAM: CHEST - 2 VIEW COMPARISON:  Radiograph 01/17/2020 FINDINGS: The cardiomediastinal contours are normal, resolved cardiomegaly from prior. Minimal bronchial thickening. Pulmonary vasculature is normal. No consolidation, pleural effusion, or pneumothorax. No acute osseous abnormalities are seen. IMPRESSION: Minimal bronchial thickening, can be seen with asthma or bronchitis. Electronically Signed   By: Keith Rake M.D.   On: 09/27/2020 16:30    Procedures Procedures   Medications Ordered in ED Medications - No data to display  ED Course  I have reviewed the triage vital signs and the nursing notes.  Pertinent labs & imaging results that were available during my care of the patient were reviewed by me and considered in my medical decision making (see chart for details).    MDM Rules/Calculators/A&P                           Patient here for evaluation of chest pain and dyspnea.  Shortness of breath onset greater than 24 hours ago.  Intermittent episodes of chest pain earlier today.  No pain at present.  She endorses having some paresthesias of the left arm at times this is also been intermittent and associated with  palpation and movement of her neck.  I feel her paresthesias are musculoskeletal and incidental finding.  Patient has concerning story for chest pain and history of NSTEMI with stent placement in 2020.  Will obtain labs, EKG and chest x-ray  On recheck, patient resting comfortably continues to deny pain.  EKG without acute ischemic change.  Chest x-ray shows some mild bronchitis.  Patient does endorse cough.  COVID test negative.  Chemistries without significant derangement.  Blood sugar is slightly elevated at 252.  Patient endorses eating cookies prior to arrival.  No significant leukocytosis or anemia.  Vitals reviewed by me.  No tachycardia, tachypnea or hypoxia.  Low clinical suspicion for PE.  I have offered patient admission to the hospital, but she declined stating that she prefers to follow-up with her cardiologist on outpatient basis.  Strict return precautions discussed  Final Clinical Impression(s) / ED Diagnoses Final diagnoses:  Nonspecific chest pain  Cough  Rx / DC Orders ED Discharge Orders     None        Kem Parkinson, PA-C 09/28/20 1451    Fredia Sorrow, MD 09/30/20 2310

## 2020-09-28 ENCOUNTER — Ambulatory Visit (INDEPENDENT_AMBULATORY_CARE_PROVIDER_SITE_OTHER): Payer: Medicare Other | Admitting: Physician Assistant

## 2020-09-28 ENCOUNTER — Encounter: Payer: Self-pay | Admitting: Physician Assistant

## 2020-09-28 VITALS — BP 118/60 | HR 72 | Ht 65.5 in | Wt 235.8 lb

## 2020-09-28 DIAGNOSIS — I209 Angina pectoris, unspecified: Secondary | ICD-10-CM

## 2020-09-28 DIAGNOSIS — I25119 Atherosclerotic heart disease of native coronary artery with unspecified angina pectoris: Secondary | ICD-10-CM

## 2020-09-28 DIAGNOSIS — R079 Chest pain, unspecified: Secondary | ICD-10-CM

## 2020-09-28 DIAGNOSIS — E785 Hyperlipidemia, unspecified: Secondary | ICD-10-CM

## 2020-09-28 DIAGNOSIS — R002 Palpitations: Secondary | ICD-10-CM | POA: Diagnosis not present

## 2020-09-28 DIAGNOSIS — R072 Precordial pain: Secondary | ICD-10-CM | POA: Diagnosis not present

## 2020-09-28 NOTE — Progress Notes (Signed)
Cardiology Office Note    Date:  09/28/2020   ID:  Courtney Mcconnell 03/20/1955, MRN [id number]   PCP:  Dettinger, Fransisca Kaufmann, MD   [place]  Cardiologist:  Rozann Lesches, MD   Advanced Practice Provider:  No care team member to display Electrophysiologist:  None   [phone]   Chief Complaint  Patient presents with   Chest Pain     History of Present Illness:  Courtney Mcconnell is a 65 y.o. female with history of CAD status post NSTEMI treated with DES to the mid LAD with residual ostial 70% first diagonal on cath 02/11/2019, normal LVEF 55 to 60% on echo 01/2019 hypertension, HLD, COPD.  Patient last saw Dr. Domenic Polite 05/17/2020 and was doing well.  Patient added onto my schedule today because of an ER visit yesterday for chest pain and shortness of breath.  She was also worried about pneumonia because of a slight cough.  She was also having paresthesias of her left arm at times that is intermittent and associated with palpitations and movement of her neck.  Was felt to be musculoskeletal.  EKG without acute change, chest x-ray mild bronchitis COVID-negative blood sugar 252, troponins negative.  Patient here with her daughter. Says she had chest pain-tightness/soreness and sweaty and left arm numbness. Pain lasted about 5 hrs. Didn't try NTG. Diagnosed with bronchitis and placed on Zpak but hasn't started. Can't walk with rhematoid arthritis. Had eaten 3 oreo cookies before it happened but she says this isn't acid reflux.    Past Medical History:  Diagnosis Date   Asthma    Bursitis    Hip   CAD (coronary artery disease) 02/11/2019   PCI/DES x1 to mLAD, focal ostial 70% lesion in 1st diag   COPD (chronic obstructive pulmonary disease) (HCC)    Essential hypertension    GERD (gastroesophageal reflux disease)    Headache(784.0)    Hyperlipidemia    NSTEMI (non-ST elevated myocardial infarction) ([place]) 02/11/2019   Pneumonia    Seizure ([place])     Sleep apnea     Past Surgical History:  Procedure Laterality Date   ABDOMINAL HYSTERECTOMY     APPENDECTOMY     CATARACT EXTRACTION Bilateral    CHOLECYSTECTOMY     CORONARY STENT INTERVENTION N/A 02/11/2019   Procedure: CORONARY STENT INTERVENTION;  Surgeon: Burnell Blanks, MD;  [facility];; mid LAD 70% -- DES PCI (Synergy DES 2.5 x 28 --2.8 mm)   heal spur     right   INTRAVASCULAR PRESSURE WIRE/FFR STUDY N/A 02/11/2019   Procedure: INTRAVASCULAR PRESSURE WIRE/FFR STUDY;  Surgeon: Burnell Blanks, MD;  Location: [facility];  Service: Cardiovascular;  Laterality: N/A;   LASER PHOTO ABLATION Right 08/27/2013   Procedure: LASER PHOTO ABLATION;  Surgeon: Hayden Pedro, MD;  Location: [facility];  Service: Ophthalmology;  Laterality: Right;  Headscope laser AND Endolaser   LEFT HEART CATH AND CORONARY ANGIOGRAPHY N/A 02/11/2019   Procedure: LEFT HEART CATH AND CORONARY ANGIOGRAPHY;  Surgeon: Burnell Blanks, MD;  Location: [facility] INVASIVE [facility];;; prox RCA 20%. Ost-prox Cx 20%. ostial D1 70% & mLAD 70% => (DES PCI of LAD)   MEMBRANE PEEL Right 08/27/2013   Procedure: MEMBRANE PEEL;  Surgeon: Hayden Pedro, MD;  Location: [facility];  Service: Ophthalmology;  Laterality: Right;   PARS PLANA VITRECTOMY Right 08/27/2013   Procedure: PARS PLANA VITRECTOMY WITH 25 GAUGE;  Surgeon: Chrystie Nose  Zigmund Daniel, MD;  Location: [facility];  Service: Ophthalmology;  Laterality: Right;    Current Medications: Current Meds  Medication Sig   Acetaminophen (TYLENOL 8 HOUR ARTHRITIS PAIN PO) Take by mouth as needed.   ADVAIR DISKUS 100-50 MCG/DOSE AEPB inhale ONE PUFF into THE lungs IN THE MORNING AND IN THE EVENING (Patient taking differently: Inhale 1 puff into the lungs 2 (two) times daily.)   albuterol (VENTOLIN HFA) 108 (90 Base) MCG/ACT inhaler INHALE TWO PUFFS INTO THE LUNGS EVERY 6 HOURS AS NEEDED FOR WHEEZING OR SHORTNESS OF BREATH   aspirin EC 81 MG EC tablet Take 1  tablet (81 mg total) by mouth daily.   atorvastatin (LIPITOR) 40 MG tablet TAKE 1 TABLET BY MOUTH DAILY AT 6PM   azithromycin (ZITHROMAX) 250 MG tablet Take first 2 tablets together, then 1 every day until finished.   Biotin w/ Vitamins C & E (HAIR/SKIN/NAILS PO) Take by mouth.   carvedilol (COREG) 3.125 MG tablet TAKE 1 TABLET BY MOUTH TWICE DAILY WITH A MEAL (Patient taking differently: Take 3.125 mg by mouth 2 (two) times daily with a meal. TAKE 1 TABLET BY MOUTH TWICE DAILY WITH A MEAL)   diclofenac Sodium (VOLTAREN) 1 % GEL Apply 2 g topically 4 (four) times daily.   Lacosamide (VIMPAT) 150 MG TABS Take 2 tablets by mouth 2 (two) times daily.   losartan (COZAAR) 25 MG tablet TAKE 1 TABLET BY MOUTH EVERY DAY   meloxicam (MOBIC) 15 MG tablet Take 1 tablet (15 mg total) by mouth daily.   nitroGLYCERIN (NITROSTAT) 0.4 MG SL tablet Place 1 tablet (0.4 mg total) under the tongue every 5 (five) minutes as needed for chest pain.   omeprazole (PRILOSEC OTC) 20 MG tablet Take 20 mg by mouth 2 (two) times daily.   predniSONE (DELTASONE) 5 MG tablet Take 2 tablets (10 mg total) by mouth daily with breakfast for 10 days, THEN 1 tablet (5 mg total) daily with breakfast for 10 days.   tiotropium (SPIRIVA HANDIHALER) 18 MCG inhalation capsule Place 1 capsule (18 mcg total) into inhaler and inhale daily.   zonisamide (ZONEGRAN) 100 MG capsule Take 2 capsules by mouth at bedtime.     Allergies:   Penicillins, Bee venom, Prednisone, Amoxicillin-pot clavulanate, and Doxycycline hyclate   Social History   Socioeconomic History   Marital status: Married    Spouse name: [name]   Number of children: 2   Years of education: GED   Highest education level: Not on file  Occupational History   Occupation: COOK    Employer: [employer]  Tobacco Use   Smoking status: Former    Packs/day: 0.25    Years: 42.00    Pack years: 10.50    Types: Cigarettes    Quit date: 03/20/2014    Years since  quitting: 6.5   Smokeless tobacco: Never   Tobacco comments:    01/21/15 restarted smoking 4 mos ago  Vaping Use   Vaping Use: Never used  Substance and Sexual Activity   Alcohol use: No    Alcohol/week: 0.0 standard drinks   Drug use: No   Sexual activity: Not on file  Other Topics Concern   Not on file  Social History Narrative   Patient lives at home with family.   Caffeine Use: 1 pot and a half a day   Social Determinants of Health   Financial Resource Strain: Not on file  Food Insecurity: Not on file  Transportation Needs: Not on file  Physical Activity: Not on file  Stress: Not on file  Social Connections: Not on file     Family History:  The patient's  family history includes COPD in her mother; Cancer in her brother, paternal uncle, and sister; Diabetes in her brother and father; Healthy in her daughter and son; Hyperlipidemia in her father; Hypertension in her father; Stroke in her paternal grandmother.   ROS:   Please see the history of present illness.    ROS All other systems reviewed and are negative.   PHYSICAL EXAM:   VS:  BP 118/60   Pulse 72   Ht 5' 5.5" (1.664 m)   Wt 235 lb 12.8 oz (107 kg)   SpO2 99%   BMI 38.64 kg/m   Physical Exam  GEN: Well nourished, well developed, in no acute distress  Neck: no JVD, carotid bruits, or masses Cardiac:RRR; no murmurs, rubs, or gallops  Respiratory: Decreased breath sounds but clear to auscultation bilaterally, normal work of breathing GI: soft, nontender, nondistended, + BS Ext: without cyanosis, clubbing, or edema, Good distal pulses bilaterally Neuro:  Alert and Oriented x 3 Psych: euthymic mood, full affect  Wt Readings from Last 3 Encounters:  09/28/20 235 lb 12.8 oz (107 kg)  09/27/20 230 lb (104.3 kg)  09/15/20 237 lb (107.5 kg)      Studies/Labs Reviewed:   EKG:  EKG is not ordered today.  The ekg reviewed from the ED yesterday see above Recent Labs: 01/19/2020: Magnesium 2.5 09/15/2020: ALT  7 09/27/2020: BUN 14; Creatinine, Ser 0.88; Hemoglobin 13.3; Platelets 221; Potassium 3.9; Sodium 134   Lipid Panel    Component Value Date/Time   CHOL 131 06/28/2020 0956   TRIG 81 06/28/2020 0956   HDL 51 06/28/2020 0956   CHOLHDL 2.6 06/28/2020 0956   CHOLHDL 3.9 02/11/2019 0211   VLDL 12 02/11/2019 0211   LDLCALC 64 06/28/2020 0956   LDLDIRECT 151 (H) 07/06/2015 1604    Additional studies/ records that were reviewed today include:  Echocardiogram 02/11/2019:  1. Left ventricular ejection fraction, by visual estimation, is 55 to  60%. The left ventricle has normal function. There is no left ventricular  hypertrophy.   2. Left ventricular diastolic parameters are consistent with Grade I  diastolic dysfunction (impaired relaxation).   3. The left ventricle has no regional wall motion abnormalities.   4. Global right ventricle has normal systolic function.The right  ventricular size is normal. No increase in right ventricular wall  thickness.   5. Left atrial size was normal.   6. Right atrial size was normal.   7. Presence of pericardial fat pad.   8. Trivial pericardial effusion is present.   9. The mitral valve is grossly normal. Trivial mitral valve  regurgitation.  10. The tricuspid valve is grossly normal.  11. The aortic valve is grossly normal. Aortic valve regurgitation is not  visualized. No evidence of aortic valve sclerosis or stenosis.  12. The pulmonic valve was grossly normal. Pulmonic valve regurgitation is  not visualized.  13. There is mild dilatation of the ascending aorta measuring 40 mm.  14. TR signal is inadequate for assessing pulmonary artery systolic  pressure.  15. The inferior vena cava is normal in size with greater than 50%  respiratory variability, suggesting right atrial pressure of 3 mmHg.  16. No prior Echocardiogram.  17. The average left ventricular global longitudinal strain is -16.3 %.    Risk Assessment/Calculations:  ASSESSMENT:    1. Chest pain, unspecified type   2. Palpitations   3. Coronary artery disease involving native coronary artery of native heart with angina pectoris ([place])   4. Hyperlipidemia with target LDL less than 70   5. Precordial pain      PLAN:  In order of problems listed above:  Chest pain was in the ER yesterday for chest pain troponins negative EKG unchanged.  Other labs stable except for an elevated glucose of 252.  Pain is concerning for angina and similar to what she had in the past.  Will order Lexiscan Myoview to rule out ischemia.  CAD status post NSTEMI treated with DES to the mid LAD 01/2019 residual 70% diagonal.  Brilinta stopped in April by Dr. Domenic Polite currently on Coreg Lipitor and Cozaar  Hyperlipidemia on Lipitor  Elevated glucose recommend she be worked up for diabetes by her PCP.  Decrease processed sugars in diet.   Shared Decision Making/Informed Consent   Shared Decision Making/Informed Consent The risks [chest pain, shortness of breath, cardiac arrhythmias, dizziness, blood pressure fluctuations, myocardial infarction, stroke/transient ischemic attack, nausea, vomiting, allergic reaction, radiation exposure, metallic taste sensation and life-threatening complications (estimated to be 1 in 10,000)], benefits (risk stratification, diagnosing coronary artery disease, treatment guidance) and alternatives of a nuclear stress test were discussed in detail with Courtney Mcconnell and she agrees to proceed.    Medication Adjustments/Labs and Tests Ordered: Current medicines are reviewed at length with the patient today.  Concerns regarding medicines are outlined above.  Medication changes, Labs and Tests ordered today are listed in the Patient Instructions below. Patient Instructions  Medication Instructions:  Your physician recommends that you continue on your current medications as directed. Please refer to the Current Medication list given to you  today.  *If you need a refill on your cardiac medications before your next appointment, please call your pharmacy*   Lab Work: None ordered   If you have labs (blood work) drawn today and your tests are completely normal, you will receive your results only by: [place] (if you have MyChart) OR A paper copy in the mail If you have any lab test that is abnormal or we need to change your treatment, we will call you to review the results.   Testing/Procedures: Your physician has requested that you have a lexiscan Myoview at [facility] For further information please visit [email or web address]. Please follow instruction sheet, as given.   Follow-Up: Follow up as scheduled    Other Instructions None     Signed, Ermalinda Barrios, PA-C  09/28/2020 2:58 PM    [facility] [place], [place],   [id number] Phone: [phone]; Fax: [phone]

## 2020-09-28 NOTE — Addendum Note (Signed)
Addended by: Mendel Ryder on: 09/28/2020 03:50 PM   Modules accepted: Orders

## 2020-09-28 NOTE — Patient Instructions (Addendum)
Medication Instructions:  Your physician recommends that you continue on your current medications as directed. Please refer to the Current Medication list given to you today.  *If you need a refill on your cardiac medications before your next appointment, please call your pharmacy*   Lab Work: None ordered   If you have labs (blood work) drawn today and your tests are completely normal, you will receive your results only by: [place] (if you have MyChart) OR A paper copy in the mail If you have any lab test that is abnormal or we need to change your treatment, we will call you to review the results.   Testing/Procedures: Your physician has requested that you have a lexiscan Myoview at [facility] For further information please visit [email or web address]. Please follow instruction sheet, as given.   Follow-Up: Follow up as scheduled    Other Instructions None

## 2020-09-28 NOTE — Addendum Note (Signed)
Addended by: Imogene Burn on: 09/28/2020 03:54 PM   Modules accepted: Orders

## 2020-09-29 ENCOUNTER — Encounter: Payer: Self-pay | Admitting: Orthopaedic Surgery

## 2020-09-29 ENCOUNTER — Other Ambulatory Visit: Payer: Self-pay

## 2020-09-29 ENCOUNTER — Ambulatory Visit (INDEPENDENT_AMBULATORY_CARE_PROVIDER_SITE_OTHER): Payer: Medicare Other | Admitting: Orthopaedic Surgery

## 2020-09-29 DIAGNOSIS — I209 Angina pectoris, unspecified: Secondary | ICD-10-CM

## 2020-09-29 DIAGNOSIS — M1711 Unilateral primary osteoarthritis, right knee: Secondary | ICD-10-CM | POA: Diagnosis not present

## 2020-09-29 NOTE — Progress Notes (Signed)
Office Visit Note   Patient: Courtney Mcconnell           Date of Birth: 1955-03-26           MRN: [id number] Visit Date: 09/29/2020              Requested by: Dettinger, Fransisca Kaufmann, MD [address] [place],  [place] [address] PCP: Dettinger, Fransisca Kaufmann, MD   Assessment & Plan: Visit Diagnoses:  1. Unilateral primary osteoarthritis, right knee     Plan: Patient had injections topicals anti-inflammatories with bone-on-bone changes medial and lateral osteophytes patellofemoral degenerative changes.  We will check her back in 2 months she just recently had an injection we discussed increased infection rate with total knee arthroplasties done soon after joint injections.  Continue use of cane ice, topical creams as before.  X-rays were reviewed both knees both knees are more severe in the medial compartment but she is having much worse symptoms on the right than left knee currently.  Follow-Up Instructions: Return in about 2 months (around 11/29/2020).   Orders:  No orders of the defined types were placed in this encounter.  No orders of the defined types were placed in this encounter.     Procedures: No procedures performed   Clinical Data: No additional findings.   Subjective: Chief Complaint  Patient presents with   Left Knee - Pain   Right Knee - Pain    HPI 65 year old female with bilateral knee pain that started in April without injury.  Worse on the right than left.  Pain severe enough to wake her up.  Patient's had problems with emphysema CVA remote coated coronary stent hypertension sleep apnea.  Patient has used lighted cane patches diclofenac gel which eases it some.  Patient's been having to use a cane.  Patient referred by one of my other patients.  Patient had injection bilaterally by Dr. Andrew Au on 09/15/2020.  Review of Systems all the systems noncontributory to HPI other than as mentioned above.   Objective: Vital Signs: Ht 5' 5.5" (1.664 m)   Wt 235 lb (106.6 kg)    BMI 38.51 kg/m   Physical Exam Constitutional:      Appearance: She is well-developed.  HENT:     Head: Normocephalic.     Right Ear: External ear normal.     Left Ear: External ear normal. There is no impacted cerumen.  Eyes:     Pupils: Pupils are equal, round, and reactive to light.  Neck:     Thyroid: No thyromegaly.     Trachea: No tracheal deviation.  Cardiovascular:     Rate and Rhythm: Normal rate.  Pulmonary:     Effort: Pulmonary effort is normal.  Abdominal:     Palpations: Abdomen is soft.  Musculoskeletal:     Cervical back: No rigidity.  Skin:    General: Skin is warm and dry.  Neurological:     Mental Status: She is alert and oriented to person, place, and time.  Psychiatric:        Behavior: Behavior normal.    Ortho Exam  Specialty Comments:  No specialty comments available.  Imaging: CLINICAL DATA:  Right knee pain and swelling.   EXAM: RIGHT KNEE - 1-2 VIEW   COMPARISON:  None.   FINDINGS: No evidence of fracture, dislocation, or joint effusion. Severe narrowing of medial joint space is noted with osteophyte formation. Moderate narrowing of patellofemoral space is noted with osteophyte formation. Soft tissues are  unremarkable.   IMPRESSION: Moderate to severe degenerative joint disease. No acute abnormality seen.     Electronically Signed   By: Marijo Conception M.D.   On: 08/10/2020 08:18   PMFS History: Patient Active Problem List   Diagnosis Date Noted   Unilateral primary osteoarthritis, right knee 10/05/2020   Pneumonia due to COVID-19 virus 01/17/2020   Hypokalemia 01/17/2020   Left shoulder pain 10/23/2019   Decreased ROM of left shoulder 10/23/2019   Coronary artery disease involving native coronary artery of native heart with angina pectoris ([place]) 05/22/2019   Hyperlipidemia with target LDL less than 70 -> CAD 02/12/2019   Presence of drug coated stent in LAD coronary artery 02/11/2019   History of non-ST elevation  myocardial infarction (NSTEMI) 02/10/2019   Cerebral infarction, remote, resolved 06/27/2018   Pulmonary nodules 12/08/2015   Draining cutaneous sinus tract 07/15/2015   Sleep apnea 04/06/2015   Pulmonary emphysema ([place]) 03/02/2015   Smoker 02/16/2015   Seizure disorder ([place]) 01/21/2015   Focal epilepsy with impairment of consciousness ([place]) 12/13/2014   Seizures ([place]) 04/18/2014   Essential hypertension 04/18/2014   Preretinal fibrosis, 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   Tobacco use disorder 07/19/2012   Past Medical History:  Diagnosis Date   Asthma    Bursitis    Hip   CAD (coronary artery disease) 02/11/2019   PCI/DES x1 to mLAD, focal ostial 70% lesion in 1st diag   COPD (chronic obstructive pulmonary disease) ([place])    Essential hypertension    GERD (gastroesophageal reflux disease)    Headache(784.0)    Hyperlipidemia    NSTEMI (non-ST elevated myocardial infarction) ([place]) 02/11/2019   Pneumonia    Seizure ([place])    Sleep apnea     Family History  Problem Relation Age of Onset   COPD Mother    Diabetes Father    Hypertension Father    Hyperlipidemia Father    Cancer Sister        Bone Cancer   Diabetes Brother    Cancer Brother    Cancer Paternal Uncle    Stroke Paternal Grandmother    Healthy Daughter    Healthy Son    Colon cancer Neg Hx     Past Surgical History:  Procedure Laterality Date   ABDOMINAL HYSTERECTOMY     APPENDECTOMY     CATARACT EXTRACTION Bilateral    CHOLECYSTECTOMY     CORONARY STENT INTERVENTION N/A 02/11/2019   Procedure: CORONARY STENT INTERVENTION;  Surgeon: Burnell Blanks, MD;  [facility];; mid LAD 70% -- DES PCI (Synergy DES 2.5 x 28 --2.8 mm)   heal spur     right   INTRAVASCULAR PRESSURE WIRE/FFR STUDY N/A 02/11/2019   Procedure: INTRAVASCULAR PRESSURE WIRE/FFR STUDY;  Surgeon: Burnell Blanks, MD;  Location: [facility];  Service: Cardiovascular;  Laterality: N/A;   LASER PHOTO ABLATION Right  08/27/2013   Procedure: LASER PHOTO ABLATION;  Surgeon: Hayden Pedro, MD;  Location: [facility];  Service: Ophthalmology;  Laterality: Right;  Headscope laser AND Endolaser   LEFT HEART CATH AND CORONARY ANGIOGRAPHY N/A 02/11/2019   Procedure: LEFT HEART CATH AND CORONARY ANGIOGRAPHY;  Surgeon: Burnell Blanks, MD;  Location: [facility] INVASIVE CV LAB;;; prox RCA 20%. Ost-prox Cx 20%. ostial D1 70% & mLAD 70% => (DES PCI of LAD)   MEMBRANE PEEL Right 08/27/2013   Procedure: MEMBRANE PEEL;  Surgeon: Hayden Pedro, MD;  Location: [facility];  Service: Ophthalmology;  Laterality:  Right;   PARS PLANA VITRECTOMY Right 08/27/2013   Procedure: PARS PLANA VITRECTOMY WITH 25 GAUGE;  Surgeon: Hayden Pedro, MD;  Location: [facility];  Service: Ophthalmology;  Laterality: Right;   Social History   Occupational History   Occupation: Airline pilot: [employer]  Tobacco Use   Smoking status: Former    Packs/day: 0.25    Years: 42.00    Pack years: 10.50    Types: Cigarettes    Quit date: 03/20/2014    Years since quitting: 6.5   Smokeless tobacco: Never   Tobacco comments:    01/21/15 restarted smoking 4 mos ago  Vaping Use   Vaping Use: Never used  Substance and Sexual Activity   Alcohol use: No    Alcohol/week: 0.0 standard drinks   Drug use: No   Sexual activity: Not on file

## 2020-10-02 ENCOUNTER — Telehealth: Payer: Self-pay | Admitting: Rheumatology

## 2020-10-02 NOTE — Telephone Encounter (Signed)
Please check the status of hepatitis C RNA quant Bo Merino, MD

## 2020-10-02 NOTE — Progress Notes (Signed)
Office Visit Note  Patient: Courtney Mcconnell             Date of Birth: [birth year]           MRN: [id number]             PCP: Dettinger, Fransisca Kaufmann, MD Referring: Dettinger, Fransisca Kaufmann, MD Visit Date: 10/10/2020 Occupation: '@GUAROCC' @  Subjective:  Pain in multiple joints   History of Present Illness: Courtney Mcconnell is a 65 y.o. female with a history of seropositive rheumatoid arthritis and osteoarthritis.  She continues to have pain and discomfort in multiple joints.  She complains of discomfort in her bilateral hands, bilateral knee joints and her ankles.  She continues to have pain and swelling in her bilateral knees and her right ankle joint.  Activities of Daily Living:  Patient reports morning stiffness for several hours.   Patient Reports nocturnal pain.  Difficulty dressing/grooming: Reports Difficulty climbing stairs: Reports Difficulty getting out of chair: Reports Difficulty using hands for taps, buttons, cutlery, and/or writing: Reports  Review of Systems  Constitutional:  Positive for fatigue.  HENT:  Positive for mouth dryness. Negative for mouth sores and nose dryness.   Eyes:  Positive for dryness. Negative for pain and itching.  Respiratory:  Negative for shortness of breath and difficulty breathing.   Cardiovascular:  Negative for chest pain and palpitations.  Gastrointestinal:  Negative for blood in stool, constipation and diarrhea.  Endocrine: Negative for increased urination.  Genitourinary:  Negative for difficulty urinating.  Musculoskeletal:  Positive for joint pain, joint pain, joint swelling, myalgias, morning stiffness, muscle tenderness and myalgias.  Skin:  Negative for color change, rash and redness.  Allergic/Immunologic: Negative for susceptible to infections.  Neurological:  Positive for dizziness, numbness, memory loss and weakness. Negative for headaches.  Hematological:  Positive for bruising/bleeding tendency.  Psychiatric/Behavioral:  Positive  for confusion.    PMFS History:  Patient Active Problem List   Diagnosis Date Noted   Unilateral primary osteoarthritis, right knee 10/05/2020   Pneumonia due to COVID-19 virus 01/17/2020   Hypokalemia 01/17/2020   Left shoulder pain 10/23/2019   Decreased ROM of left shoulder 10/23/2019   Coronary artery disease involving native coronary artery of native heart with angina pectoris ([place]) 05/22/2019   Hyperlipidemia with target LDL less than 70 -> CAD 02/12/2019   Presence of drug coated stent in LAD coronary artery 02/11/2019   History of non-ST elevation myocardial infarction (NSTEMI) 02/10/2019   Cerebral infarction, remote, resolved 06/27/2018   Pulmonary nodules 12/08/2015   Draining cutaneous sinus tract 07/15/2015   Sleep apnea 04/06/2015   Pulmonary emphysema ([place]) 03/02/2015   Smoker 02/16/2015   Seizure disorder ([place]) 01/21/2015   Focal epilepsy with impairment of consciousness ([place]) 12/13/2014   Seizures ([place]) 04/18/2014   Essential hypertension 04/18/2014   Preretinal fibrosis, 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   Tobacco use disorder 07/19/2012    Past Medical History:  Diagnosis Date   Asthma    Bursitis    Hip   CAD (coronary artery disease) 02/11/2019   PCI/DES x1 to mLAD, focal ostial 70% lesion in 1st diag   COPD (chronic obstructive pulmonary disease) ([place])    Essential hypertension    GERD (gastroesophageal reflux disease)    Headache(784.0)    Hyperlipidemia    NSTEMI (non-ST elevated myocardial infarction) ([place]) 02/11/2019   Pneumonia    Seizure ([place])    Sleep apnea     Family History  Problem Relation Age of Onset  COPD Mother    Diabetes Father    Hypertension Father    Hyperlipidemia Father    Cancer Sister        Bone Cancer   Diabetes Brother    Cancer Brother    Cancer Paternal Uncle    Stroke Paternal Grandmother    Healthy Daughter    Healthy Son    Colon cancer Neg Hx    Past Surgical History:  Procedure Laterality Date   ABDOMINAL  HYSTERECTOMY     APPENDECTOMY     CATARACT EXTRACTION Bilateral    CHOLECYSTECTOMY     CORONARY STENT INTERVENTION N/A 02/11/2019   Procedure: CORONARY STENT INTERVENTION;  Surgeon: Burnell Blanks, MD;  Location: [facility];; mid LAD 70% -- DES PCI (Synergy DES 2.5 x 28 --2.8 mm)   heal spur     right   INTRAVASCULAR PRESSURE WIRE/FFR STUDY N/A 02/11/2019   Procedure: INTRAVASCULAR PRESSURE WIRE/FFR STUDY;  Surgeon: Burnell Blanks, MD;  Location: [facility];  Service: Cardiovascular;  Laterality: N/A;   LASER PHOTO ABLATION Right 08/27/2013   Procedure: LASER PHOTO ABLATION;  Surgeon: Hayden Pedro, MD;  Location: [facility];  Service: Ophthalmology;  Laterality: Right;  Headscope laser AND Endolaser   LEFT HEART CATH AND CORONARY ANGIOGRAPHY N/A 02/11/2019   Procedure: LEFT HEART CATH AND CORONARY ANGIOGRAPHY;  Surgeon: Burnell Blanks, MD;  Location: [facility];;; prox RCA 20%. Ost-prox Cx 20%. ostial D1 70% & mLAD 70% => (DES PCI of LAD)   MEMBRANE PEEL Right 08/27/2013   Procedure: MEMBRANE PEEL;  Surgeon: Hayden Pedro, MD;  Location: [facility];  Service: Ophthalmology;  Laterality: Right;   PARS PLANA VITRECTOMY Right 08/27/2013   Procedure: PARS PLANA VITRECTOMY WITH 25 GAUGE;  Surgeon: Hayden Pedro, MD;  Location: [facility];  Service: Ophthalmology;  Laterality: Right;   Social History   Social History Narrative   Patient lives at home with family.   Caffeine Use: 1 pot and a half a day   Immunization History  Administered Date(s) Administered   MMR 08/26/2007   Td 08/11/2007   Tdap 08/12/2014     Objective: Vital Signs: BP (!) 155/72 (BP Location: Left Arm, Patient Position: Sitting, Cuff Size: Normal)   Pulse 60   Ht 5' 3.5" (1.613 m)   Wt 240 lb (108.9 kg)   BMI 41.85 kg/m    Physical Exam Vitals and nursing note reviewed.  Constitutional:      Appearance: She is well-developed.  HENT:     Head: Normocephalic and  atraumatic.  Eyes:     Conjunctiva/sclera: Conjunctivae normal.  Cardiovascular:     Rate and Rhythm: Normal rate and regular rhythm.     Heart sounds: Normal heart sounds.  Pulmonary:     Effort: Pulmonary effort is normal.     Breath sounds: Normal breath sounds.  Abdominal:     General: Bowel sounds are normal.     Palpations: Abdomen is soft.  Musculoskeletal:     Cervical back: Normal range of motion.  Lymphadenopathy:     Cervical: No cervical adenopathy.  Skin:    General: Skin is warm and dry.     Capillary Refill: Capillary refill takes less than 2 seconds.  Neurological:     Mental Status: She is alert and oriented to person, place, and time.  Psychiatric:        Behavior: Behavior normal.     Musculoskeletal Exam: C-spine  was in good range of motion.  Shoulder joints, elbow joints, wrist joints, MCPs PIPs and DIPs with good range of motion.  PIP and DIP thickening with no synovitis was noted.  Hip joints were difficult to assess in the sitting position.  She had warmth on palpation of her bilateral knee joints and difficulty with range of motion.  No swelling on the right ankle joint with some warmth was noted.  She had no tenderness over right ankle joint.  She had good range of motion of her right ankle joint.  No synovitis was noted over MTPs.  CDAI Exam: CDAI Score: 5.2  Patient Global: 6 mm; Provider Global: 6 mm Swollen: 3 ; Tender: 2  Joint Exam 10/10/2020      Right  Left  Knee  Swollen Tender  Swollen Tender  Ankle  Swollen         Investigation: No additional findings.  Imaging: DG Chest 2 View  Result Date: 09/27/2020 CLINICAL DATA:  Chest pain.  Arm pain and shortness of breath EXAM: CHEST - 2 VIEW COMPARISON:  Radiograph 01/17/2020 FINDINGS: The cardiomediastinal contours are normal, resolved cardiomegaly from prior. Minimal bronchial thickening. Pulmonary vasculature is normal. No consolidation, pleural effusion, or pneumothorax. No acute osseous  abnormalities are seen. IMPRESSION: Minimal bronchial thickening, can be seen with asthma or bronchitis. Electronically Signed   By: Keith Rake M.D.   On: 09/27/2020 16:30   XR Foot 2 Views Left  Result Date: 09/15/2020 PIP and DIP narrowing was noted.  No MTP, intertarsal or tibiotalar joint space narrowing was noted.  Inferior and posterior calcaneal spurs were noted. Impression: These findings are consistent with osteoarthritis of the foot.  XR Foot 2 Views Right  Result Date: 09/15/2020 PIP and DIP narrowing was noted.  No MTP, intertarsal or tibiotalar joint space narrowing was noted.  Inferior and posterior calcaneal spurs were noted. Impression: These findings are consistent with osteoarthritis of the foot.  XR Hand 2 View Left  Result Date: 09/15/2020 CMC, PIP, and DIP narrowing was noted.  No MCP, intercarpal or radiocarpal joint space narrowing was noted.  No erosive changes were noted. Impression: These findings are consistent with osteoarthritis of the hand.  XR Hand 2 View Right  Result Date: 09/15/2020 CMC, PIP and DIP narrowing was noted.  No MCP, intercarpal or radiocarpal joint space narrowing was noted.  No erosive changes were noted. Impression: These findings are consistent with osteoarthritis of the hand.   Recent Labs: Lab Results  Component Value Date   WBC 11.1 (H) 09/27/2020   HGB 13.3 09/27/2020   PLT 221 09/27/2020   NA 134 (L) 09/27/2020   K 3.9 09/27/2020   CL 102 09/27/2020   CO2 27 09/27/2020   GLUCOSE 252 (H) 09/27/2020   BUN 14 09/27/2020   CREATININE 0.88 09/27/2020   BILITOT 0.5 09/15/2020   ALKPHOS 114 03/03/2020   AST 17 09/15/2020   ALT 7 09/15/2020   PROT 6.9 09/15/2020   PROT 7.1 09/15/2020   ALBUMIN 4.2 03/03/2020   CALCIUM 9.0 09/27/2020   GFRAA 87 03/03/2020   QFTBGOLDPLUS NEGATIVE 09/15/2020   09/15/2020 IFE negative, IgM 32 low, hepatitis C reactive, hepatitis B negative, CK 63, hepatitis C RNA quant  negative.     Speciality Comments: No specialty comments available.  Procedures:  No procedures performed Allergies: Penicillins, Bee venom, Prednisone, Amoxicillin-pot clavulanate, and Doxycycline hyclate   Assessment / Plan:     Visit Diagnoses: Rheumatoid arthritis involving multiple sites with  positive rheumatoid factor (HCC) - +RF,+anti-CCP, -ANA.H/o multiple knee joint aspirations.  Patient states that she has taken prednisone in the past without much benefit.  She had no synovitis in her hands and her feet.  She had warmth and swelling in her bilateral knee joints.  She also had some swelling in her right ankle joint without any discomfort.  Although x-rays obtained at the last visit were consistent with osteoarthritis.  No joint space narrowing or erosive changes were noted.  Had a detailed discussion with the patient and her daughter that knee joint swelling and discomfort could be related to osteoarthritis although rheumatoid arthritis is a likely possibility.  We can try methotrexate and see response to it.  She gives history of frequent aspirations in the past.  She will also benefit from bilateral total knee replacement.  Indications side effects contraindications of methotrexate were discussed at length.  Handout was given and consent was taken.  She wants to proceed with methotrexate.  We will start her on methotrexate 4 tablets p.o. weekly and increase it to 6 tablets p.o. weekly after 2 weeks if labs are stable.  As she is on omeprazole and a statin we will have to be careful with the methotrexate dosing.  She was advised to discontinue use of meloxicam.  She uses meloxicam only on as needed basis.  Drug Counseling TB Gold: September 15, 2020 Hepatitis panel: September 15, 2020  Chest-xray: September 27, 2020  Contraception: Not indicated  Alcohol use: None  Patient was counseled on the purpose, proper use, and adverse effects of methotrexate including nausea, infection, and signs and  symptoms of pneumonitis.  Reviewed instructions with patient to take methotrexate weekly along with folic acid daily.  Discussed the importance of frequent monitoring of kidney and liver function and blood counts, and provided patient with standing lab instructions.  Counseled patient to avoid NSAIDs and alcohol while on methotrexate.  Provided patient with educational materials on methotrexate and answered all questions.  Advised patient to get annual influenza vaccine and to get a pneumococcal vaccine if patient has not already had one.  Patient voiced understanding.  Patient consented to methotrexate use.  Will upload into chart.     High risk medication use-we will check labs every 2 weeks x2 and then every 3 months if labs are stable.  Primary osteoarthritis of both hands - Clinical and radiographic findings are consistent with osteoarthritis.  Patient had no synovitis on examination.  Primary osteoarthritis of both knees - Bilateral knee joint swelling was noted.  She gives history of recurrent aspirations in the past.  Severe osteoarthritis and bilateral severe chondromalacia patella.  We discussed total knee replacement in the future.  Patient has an appointment coming up with the surgeon.  Primary osteoarthritis of both feet-she had swelling in her right ankle joint without any discomfort.  She also had some pedal edema.  No synovitis was noted over MCPs or PIPs.  DDD (degenerative disc disease), cervical-she continues to have some neck discomfort.  Essential hypertension-blood pressure was elevated today.  Other medical problems are listed as follows:  Coronary artery disease involving native coronary artery of native heart with angina pectoris ([place])  History of non-ST elevation myocardial infarction (NSTEMI)  Presence of drug coated stent in LAD coronary artery  Hyperlipidemia with target LDL less than 70 -> CAD  Focal epilepsy with impairment of consciousness (HCC)  Cerebral  infarction, remote, resolved  History of emphysema  Pulmonary nodules  Preretinal fibrosis, right eye  Former smoker - 1 pack/day for 45 years, she quit smoking at age 33.  Orders: No orders of the defined types were placed in this encounter.  No orders of the defined types were placed in this encounter.    Follow-Up Instructions: Return in about 6 weeks (around 11/21/2020) for Rheumatoid arthritis.   Bo Merino, MD  Note - This record has been created using Editor, commissioning.  Chart creation errors have been sought, but may not always  have been located. Such creation errors do not reflect on  the standard of medical care.

## 2020-10-03 NOTE — Telephone Encounter (Signed)
Hepatitis C RNA Quant.  HCV RNA, PCR, QN <15Not detected  HCV Quantitative Log <1.18 Not detected

## 2020-10-05 DIAGNOSIS — M1711 Unilateral primary osteoarthritis, right knee: Secondary | ICD-10-CM | POA: Insufficient documentation

## 2020-10-10 ENCOUNTER — Encounter: Payer: Self-pay | Admitting: Rheumatology

## 2020-10-10 ENCOUNTER — Other Ambulatory Visit: Payer: Self-pay

## 2020-10-10 ENCOUNTER — Ambulatory Visit (INDEPENDENT_AMBULATORY_CARE_PROVIDER_SITE_OTHER): Payer: Medicare Other | Admitting: Rheumatology

## 2020-10-10 VITALS — BP 155/72 | HR 60 | Ht 63.5 in | Wt 240.0 lb

## 2020-10-10 DIAGNOSIS — M19072 Primary osteoarthritis, left ankle and foot: Secondary | ICD-10-CM

## 2020-10-10 DIAGNOSIS — Z8709 Personal history of other diseases of the respiratory system: Secondary | ICD-10-CM

## 2020-10-10 DIAGNOSIS — G40209 Localization-related (focal) (partial) symptomatic epilepsy and epileptic syndromes with complex partial seizures, not intractable, without status epilepticus: Secondary | ICD-10-CM

## 2020-10-10 DIAGNOSIS — M19041 Primary osteoarthritis, right hand: Secondary | ICD-10-CM

## 2020-10-10 DIAGNOSIS — E785 Hyperlipidemia, unspecified: Secondary | ICD-10-CM | POA: Diagnosis not present

## 2020-10-10 DIAGNOSIS — I252 Old myocardial infarction: Secondary | ICD-10-CM

## 2020-10-10 DIAGNOSIS — I25119 Atherosclerotic heart disease of native coronary artery with unspecified angina pectoris: Secondary | ICD-10-CM

## 2020-10-10 DIAGNOSIS — M0579 Rheumatoid arthritis with rheumatoid factor of multiple sites without organ or systems involvement: Secondary | ICD-10-CM

## 2020-10-10 DIAGNOSIS — I10 Essential (primary) hypertension: Secondary | ICD-10-CM | POA: Diagnosis not present

## 2020-10-10 DIAGNOSIS — Z8673 Personal history of transient ischemic attack (TIA), and cerebral infarction without residual deficits: Secondary | ICD-10-CM

## 2020-10-10 DIAGNOSIS — Z79899 Other long term (current) drug therapy: Secondary | ICD-10-CM

## 2020-10-10 DIAGNOSIS — M170 Bilateral primary osteoarthritis of knee: Secondary | ICD-10-CM

## 2020-10-10 DIAGNOSIS — Z955 Presence of coronary angioplasty implant and graft: Secondary | ICD-10-CM | POA: Diagnosis not present

## 2020-10-10 DIAGNOSIS — M5030 Other cervical disc degeneration, unspecified cervical region: Secondary | ICD-10-CM

## 2020-10-10 DIAGNOSIS — H35371 Puckering of macula, right eye: Secondary | ICD-10-CM

## 2020-10-10 DIAGNOSIS — M19071 Primary osteoarthritis, right ankle and foot: Secondary | ICD-10-CM

## 2020-10-10 DIAGNOSIS — I209 Angina pectoris, unspecified: Secondary | ICD-10-CM

## 2020-10-10 DIAGNOSIS — Z87891 Personal history of nicotine dependence: Secondary | ICD-10-CM

## 2020-10-10 DIAGNOSIS — R918 Other nonspecific abnormal finding of lung field: Secondary | ICD-10-CM

## 2020-10-10 DIAGNOSIS — M19042 Primary osteoarthritis, left hand: Secondary | ICD-10-CM

## 2020-10-10 MED ORDER — METHOTREXATE 2.5 MG PO TABS: ORAL_TABLET | ORAL | 0 refills | Status: DC

## 2020-10-10 MED ORDER — FOLIC ACID 1 MG PO TABS: 1.0000 mg | ORAL_TABLET | Freq: Every day | ORAL | 2 refills | Status: DC

## 2020-10-10 NOTE — Patient Instructions (Addendum)
Methotrexate Tablets What is this medication? METHOTREXATE (METH oh TREX ate) treats inflammatory conditions such as arthritis and psoriasis. It works by decreasing inflammation, which can reduce pain and prevent long-term injury to the joints and skin. It may also be used to treat some types of cancer. It works by slowing down the growth of cancercells. This medicine may be used for other purposes; ask your health care provider orpharmacist if you have questions. COMMON BRAND NAME(S): Rheumatrex, Trexall What should I tell my care team before I take this medication? They need to know if you have any of these conditions: Fluid in the stomach area or lungs If you often drink alcohol Infection or immune system problems Kidney disease or on hemodialysis Liver disease Low blood counts, like low white cell, platelet, or red cell counts Lung disease Radiation therapy Stomach ulcers Ulcerative colitis An unusual or allergic reaction to methotrexate, other medications, foods, dyes, or preservatives Pregnant or trying to get pregnant Breast-feeding How should I use this medication? Take this medication by mouth with a glass of water. Follow the directions on the prescription label. Take your medication at regular intervals. Do not take it more often than directed. Do not stop taking except on your care team'sadvice. Make sure you know why you are taking this medication and how often you should take it. If this medication is used for a condition that is not cancer, like arthritis or psoriasis, it should be taken weekly, NOT daily. Taking thismedication more often than directed can cause serious side effects, even death. Talk to your care team about safe handling and disposal of this medication. Youmay need to take special precautions. Talk to your care team about the use of this medication in children. While thismedication may be prescribed for selected conditions, precautions do apply. Overdosage: If  you think you have taken too much of this medicine contact apoison control center or emergency room at once. NOTE: This medicine is only for you. Do not share this medicine with others. What if I miss a dose? If you miss a dose, talk with your care team. Do not take double or extra doses. What may interact with this medication? Do not take this medication with any of the following: Acitretin This medication may also interact with the following: Aspirin and aspirin-like medications including salicylates Azathioprine Certain antibiotics like penicillins, tetracycline, and chloramphenicol Certain medications that treat or prevent blood clots like warfarin, apixaban, dabigatran, and rivaroxaban Certain medications for stomach problems like esomeprazole, omeprazole, pantoprazole Cyclosporine Dapsone Diuretics Gold Hydroxychloroquine Live virus vaccines Medications for infection like acyclovir, adefovir, amphotericin B, bacitracin, cidofovir, foscarnet, ganciclovir, gentamicin, pentamidine, vancomycin Mercaptopurine NSAIDs, medications for pain and inflammation, like ibuprofen or naproxen Other cytotoxic agents Pamidronate Pemetrexed Penicillamine Phenylbutazone Phenytoin Probenecid Pyrimethamine Retinoids such as isotretinoin and tretinoin Steroid medications like prednisone or cortisone Sulfonamides like sulfasalazine and trimethoprim/sulfamethoxazole Theophylline Zoledronic acid This list may not describe all possible interactions. Give your health care provider a list of all the medicines, herbs, non-prescription drugs, or dietary supplements you use. Also tell them if you smoke, drink alcohol, or use illegaldrugs. Some items may interact with your medicine. What should I watch for while using this medication? Avoid alcoholic drinks. This medication can make you more sensitive to the sun. Keep out of the sun. If you cannot avoid being in the sun, wear protective clothing and use  sunscreen.Do not use sun lamps or tanning beds/booths. You may need blood work done while you are taking this medication.  Call your care team for advice if you get a fever, chills or sore throat, or other symptoms of a cold or flu. Do not treat yourself. This medication decreases your body's ability to fight infections. Try to avoid being aroundpeople who are sick. This medication may increase your risk to bruise or bleed. Call your care teamif you notice any unusual bleeding. Be careful brushing or flossing your teeth or using a toothpick because you may get an infection or bleed more easily. If you have any dental work done, Primary school teacher you are receiving this medication. Check with your care team if you get an attack of severe diarrhea, nausea and vomiting, or if you sweat a lot. The loss of too much body fluid can make itdangerous for you to take this medication. Talk to your care team about your risk of cancer. You may be more at risk forcertain types of cancers if you take this medication. Do not become pregnant while taking this medication or for 6 months after stopping it. Women should inform their care team if they wish to become pregnant or think they might be pregnant. Men should not father a child while taking this medication and for 3 months after stopping it. There is potential for serious harm to an unborn child. Talk to your care team for more information. Do not breast-feed an infant while taking this medication or for 1week after stopping it. This medication may make it more difficult to get pregnant or father a child.Talk to your care team if you are concerned about your fertility. What side effects may I notice from receiving this medication? Side effects that you should report to your care team as soon as possible: Allergic reactions-skin rash, itching, hives, swelling of the face, lips, tongue, or throat Blood clot-pain, swelling, or warmth in the leg, shortness of breath, chest  pain Dry cough, shortness of breath or trouble breathing Infection-fever, chills, cough, sore throat, wounds that don't heal, pain or trouble when passing urine, general feeling of discomfort or being unwell Kidney injury-decrease in the amount of urine, swelling of the ankles, hands, or feet Liver injury-right upper belly pain, loss of appetite, nausea, light-colored stool, dark yellow or brown urine, yellowing of the skin or eyes, unusual weakness or fatigue Low red blood cell count-unusual weakness or fatigue, dizziness, headache, trouble breathing Redness, blistering, peeling, or loosening of the skin, including inside the mouth Seizures Unusual bruising or bleeding Side effects that usually do not require medical attention (report to your careteam if they continue or are bothersome): Diarrhea Dizziness Hair loss Nausea Pain, redness, or swelling with sores inside the mouth or throat Vomiting This list may not describe all possible side effects. Call your doctor for medical advice about side effects. You may report side effects to FDA at1-800-FDA-1088. Where should I keep my medication? Keep out of the reach of children and pets. Store at room temperature between 20 and 25 degrees C (68 and 77 degrees F).Protect from light. Get rid of any unused medication after the expiration date. Talk to your care team about how to dispose of unused medication. Specialdirections may apply. NOTE: This sheet is a summary. It may not cover all possible information. If you have questions about this medicine, talk to your doctor, pharmacist, orhealth care provider.  2022 Elsevier/Gold Standard (2020-03-07 15:55:45)  Standing Labs We placed an order today for your standing lab work.   Please have your standing labs drawn in 2 weeks x 2 and then  every 3 months  If possible, please have your labs drawn 2 weeks prior to your appointment so that the provider can discuss your results at your  appointment.  Please note that you may see your imaging and lab results in [place] before we have reviewed them. We may be awaiting multiple results to interpret others before contacting you. Please allow our office up to 72 hours to thoroughly review all of the results before contacting the office for clarification of your results.  We have open lab daily: Monday through Thursday from 1:30-4:30 PM and Friday from 1:30-4:00 PM at the office of Dr. Bo Merino, [facility].   Please be advised, all patients with office appointments requiring lab work will take precedent over walk-in lab work.  If possible, please come for your lab work on Monday and Friday afternoons, as you may experience shorter wait times. The office is located at [address], [place], [place], [place] [address] No appointment is necessary.   Labs are drawn by [facility]. Please bring your co-pay at the time of your lab draw.  You may receive a bill from [facility] for your lab work.  If you wish to have your labs drawn at another location, please call the office 24 hours in advance to send orders.  If you have any questions regarding directions or hours of operation,  please call [phone].   As a reminder, please drink plenty of water prior to coming for your lab work. Thanks!   Vaccines You are taking a medication(s) that can suppress your immune system.  The following immunizations are recommended: Flu annually Covid-19  Td/Tdap (tetanus, diphtheria, pertussis) every 10 years Pneumonia (Prevnar 15 then Pneumovax 23 at least 1 year apart.  Alternatively, can take Prevnar 20 without needing additional dose) Shingrix (after age 61): 2 doses from 4 weeks to 6 months apart  Please check with your PCP to make sure you are up to date.   COVID-19 vaccine recommendations:   COVID-19 vaccine is recommended for everyone (unless you are allergic to a vaccine component), even if you are on a medication that  suppresses your immune system.   If you are on Methotrexate, Cellcept (mycophenolate), Rinvoq, Morrie Sheldon, and Olumiant- hold the medication for 1 week after each vaccine. Hold Methotrexate for 2 weeks after the single dose COVID-19 vaccine.   If you are on Orencia subcutaneous injection - hold medication one week prior to and one week after the first COVID-19 vaccine dose (only).   If you are on Orencia IV infusions- time vaccination administration so that the first COVID-19 vaccination will occur four weeks after the infusion and postpone the subsequent infusion by one week.   If you are on Cyclophosphamide or Rituxan infusions please contact your doctor prior to receiving the COVID-19 vaccine.   Do not take Tylenol or any anti-inflammatory medications (NSAIDs) 24 hours prior to the COVID-19 vaccination.   There is no direct evidence about the efficacy of the COVID-19 vaccine in individuals who are on medications that suppress the immune system.   Even if you are fully vaccinated, and you are on any medications that suppress your immune system, please continue to wear a mask, maintain at least six feet social distance and practice hand hygiene.   If you develop a COVID-19 infection, please contact your PCP or our office to determine if you need monoclonal antibody infusion.  The booster vaccine is now available for immunocompromised patients.   Please see the following web  sites for updated information.   [email or web address]   If you test POSITIVE for COVID19 and have MILD to MODERATE symptoms: First, call your PCP if you would like to receive COVID19 treatment AND Hold your medications during the infection and for at least 1 week after your symptoms have resolved: Injectable medication (Benlysta, Cimzia, Cosentyx, Enbrel, Humira, Orencia, Remicade, Simponi, Stelara, Taltz, Tremfya) Methotrexate Leflunomide  (Arava) Azathioprine Mycophenolate (Cellcept) Roma Kayser, or Rinvoq Otezla If you take Actemra or Kevzara, you DO NOT need to hold these for COVID19 infection.  If you test POSITIVE for COVID19 and have NO symptoms: First, call your PCP if you would like to receive COVID19 treatment AND Hold your medications for at least 10 days after the day that you tested positive Injectable medication (Benlysta, Cimzia, Cosentyx, Enbrel, Humira, Orencia, Remicade, Simponi, Stelara, Taltz, Tremfya) Methotrexate Leflunomide (Arava) Azathioprine Mycophenolate (Cellcept) Roma Kayser, or Rinvoq Otezla If you take Actemra or Kevzara, you DO NOT need to hold these for COVID19 infection.  If you have signs or symptoms of an infection or start antibiotics: First, call your PCP for workup of your infection. Hold your medication through the infection, until you complete your antibiotics, and until symptoms resolve if you take the following: Injectable medication (Actemra, Benlysta, Cimzia, Cosentyx, Enbrel, Humira, Kevzara, Orencia, Remicade, Simponi, Stelara, Taltz, Tremfya) Methotrexate Leflunomide (Arava) Mycophenolate (Cellcept) Morrie Sheldon, Olumiant, or Rinvoq  Heart Disease Prevention   Your inflammatory disease increases your risk of heart disease which includes heart attack, stroke, atrial fibrillation (irregular heartbeats), high blood pressure, heart failure and atherosclerosis (plaque in the arteries).  It is important to reduce your risk by:   Keep blood pressure, cholesterol, and blood sugar at healthy levels   Smoking Cessation   Maintain a healthy weight  BMI 20-25   Eat a healthy diet  Plenty of fresh fruit, vegetables, and whole grains  Limit saturated fats, foods high in sodium, and added sugars  DASH and Mediterranean diet   Increase physical activity  Recommend moderate physically activity for 150 minutes per week/ 30 minutes a day for five days a week These can be  broken up into three separate ten-minute sessions during the day.   Reduce Stress  Meditation, slow breathing exercises, yoga, coloring books  Dental visits twice a year

## 2020-10-12 ENCOUNTER — Other Ambulatory Visit: Payer: Self-pay | Admitting: Physician Assistant

## 2020-10-12 ENCOUNTER — Other Ambulatory Visit: Payer: Self-pay

## 2020-10-12 ENCOUNTER — Ambulatory Visit (HOSPITAL_COMMUNITY)
Admission: RE | Admit: 2020-10-12 | Discharge: 2020-10-12 | Disposition: A | Discharge status: Home / Self Care | Payer: Medicare Other | Source: Ambulatory Visit | Attending: Physician Assistant | Admitting: Physician Assistant

## 2020-10-12 ENCOUNTER — Encounter (HOSPITAL_COMMUNITY): Payer: Self-pay

## 2020-10-12 DIAGNOSIS — R072 Precordial pain: Secondary | ICD-10-CM

## 2020-10-12 LAB — NM MYOCAR MULTI W/SPECT W/WALL MOTION / EF
LV dias vol: 123 mL (ref 46–106)
LV sys vol: 42 mL
Nuc Stress EF: 66 %
Peak HR: 69 {beats}/min
RATE: 0.4
Rest HR: 56 {beats}/min
Rest Nuclear Isotope Dose: 10.5 mCi
SDS: 2
SRS: 0
SSS: 2
ST Depression (mm): 0 mm
Stress Nuclear Isotope Dose: 33 mCi
TID: 1.11

## 2020-10-12 MED ORDER — SODIUM CHLORIDE FLUSH 0.9 % IV SOLN
INTRAVENOUS | Status: AC
  Administered 2020-10-12: 10 mL via INTRAVENOUS
  Filled 2020-10-12: qty 10

## 2020-10-12 MED ORDER — TECHNETIUM TC 99M TETROFOSMIN IV KIT
30.0000 | PACK | Freq: Once | INTRAVENOUS | Status: AC | PRN
  Administered 2020-10-12: 33 via INTRAVENOUS

## 2020-10-12 MED ORDER — TECHNETIUM TC 99M TETROFOSMIN IV KIT
10.0000 | PACK | Freq: Once | INTRAVENOUS | Status: AC | PRN
  Administered 2020-10-12: 10.5 via INTRAVENOUS

## 2020-10-12 MED ORDER — REGADENOSON 0.4 MG/5ML IV SOLN
INTRAVENOUS | Status: AC
  Administered 2020-10-12: 0.4 mg via INTRAVENOUS
  Filled 2020-10-12: qty 5

## 2020-10-19 DIAGNOSIS — Z20828 Contact with and (suspected) exposure to other viral communicable diseases: Secondary | ICD-10-CM | POA: Diagnosis not present

## 2020-10-27 ENCOUNTER — Ambulatory Visit (INDEPENDENT_AMBULATORY_CARE_PROVIDER_SITE_OTHER): Payer: Medicare Other | Admitting: Family Medicine

## 2020-10-27 ENCOUNTER — Ambulatory Visit (INDEPENDENT_AMBULATORY_CARE_PROVIDER_SITE_OTHER): Payer: Medicare Other

## 2020-10-27 ENCOUNTER — Encounter: Payer: Self-pay | Admitting: Family Medicine

## 2020-10-27 ENCOUNTER — Other Ambulatory Visit: Payer: Self-pay

## 2020-10-27 VITALS — BP 148/83 | HR 65 | Ht 63.5 in | Wt 238.0 lb

## 2020-10-27 DIAGNOSIS — Z780 Asymptomatic menopausal state: Secondary | ICD-10-CM

## 2020-10-27 DIAGNOSIS — E1169 Type 2 diabetes mellitus with other specified complication: Secondary | ICD-10-CM | POA: Diagnosis not present

## 2020-10-27 DIAGNOSIS — I10 Essential (primary) hypertension: Secondary | ICD-10-CM

## 2020-10-27 DIAGNOSIS — I25119 Atherosclerotic heart disease of native coronary artery with unspecified angina pectoris: Secondary | ICD-10-CM | POA: Diagnosis not present

## 2020-10-27 DIAGNOSIS — I209 Angina pectoris, unspecified: Secondary | ICD-10-CM

## 2020-10-27 DIAGNOSIS — E785 Hyperlipidemia, unspecified: Secondary | ICD-10-CM | POA: Diagnosis not present

## 2020-10-27 LAB — CMP14+EGFR
ALT: 6 IU/L (ref 0–32)
AST: 11 IU/L (ref 0–40)
Albumin/Globulin Ratio: 1.7 (ref 1.2–2.2)
Albumin: 4.1 g/dL (ref 3.8–4.8)
Alkaline Phosphatase: 121 IU/L (ref 44–121)
BUN/Creatinine Ratio: 10 — ABNORMAL LOW (ref 12–28)
BUN: 9 mg/dL (ref 8–27)
Bilirubin Total: 0.3 mg/dL (ref 0.0–1.2)
CO2: 22 mmol/L (ref 20–29)
Calcium: 9.2 mg/dL (ref 8.7–10.3)
Chloride: 103 mmol/L (ref 96–106)
Creatinine, Ser: 0.9 mg/dL (ref 0.57–1.00)
Globulin, Total: 2.4 g/dL (ref 1.5–4.5)
Glucose: 129 mg/dL — ABNORMAL HIGH (ref 65–99)
Potassium: 4.3 mmol/L (ref 3.5–5.2)
Sodium: 139 mmol/L (ref 134–144)
Total Protein: 6.5 g/dL (ref 6.0–8.5)
eGFR: 71 mL/min/{1.73_m2} (ref >59)

## 2020-10-27 LAB — CBC WITH DIFFERENTIAL/PLATELET
Basophils Absolute: 0 10*3/uL (ref 0.0–0.2)
Basos: 0 %
EOS (ABSOLUTE): 0.1 10*3/uL (ref 0.0–0.4)
Eos: 1 %
Hematocrit: 39.9 % (ref 34.0–46.6)
Hemoglobin: 13.3 g/dL (ref 11.1–15.9)
Immature Grans (Abs): 0 10*3/uL (ref 0.0–0.1)
Immature Granulocytes: 1 %
Lymphocytes Absolute: 2.4 10*3/uL (ref 0.7–3.1)
Lymphs: 30 %
MCH: 32.6 pg (ref 26.6–33.0)
MCHC: 33.3 g/dL (ref 31.5–35.7)
MCV: 98 fL — ABNORMAL HIGH (ref 79–97)
Monocytes Absolute: 0.7 10*3/uL (ref 0.1–0.9)
Monocytes: 9 %
Neutrophils Absolute: 4.7 10*3/uL (ref 1.4–7.0)
Neutrophils: 59 %
Platelets: 246 10*3/uL (ref 150–450)
RBC: 4.08 x10E6/uL (ref 3.77–5.28)
RDW: 12.9 % (ref 11.7–15.4)
WBC: 8 10*3/uL (ref 3.4–10.8)

## 2020-10-27 LAB — LIPID PANEL
Chol/HDL Ratio: 2.8 ratio (ref 0.0–4.4)
Cholesterol, Total: 145 mg/dL (ref 100–199)
HDL: 52 mg/dL (ref >39)
LDL Chol Calc (NIH): 76 mg/dL (ref 0–99)
Triglycerides: 92 mg/dL (ref 0–149)
VLDL Cholesterol Cal: 17 mg/dL (ref 5–40)

## 2020-10-27 LAB — BAYER DCA HB A1C WAIVED: HB A1C (BAYER DCA - WAIVED): 6.5 % — ABNORMAL HIGH (ref 4.8–5.6)

## 2020-10-27 NOTE — Progress Notes (Signed)
BP (!) 148/83   Pulse 65   Ht 5' 3.5" (1.613 m)   Wt 238 lb (108 kg)   SpO2 98%   BMI 41.50 kg/m    Subjective:   Patient ID: Courtney Mcconnell, female    DOB: [birth year], 65 y.o.   MRN: [id number]  HPI: Courtney Mcconnell is a 65 y.o. female presenting on 10/27/2020 for Medical Management of Chronic Issues   HPI Elevated blood sugars and likely diabetes Patient has had some blood work done at various doctors recently over the past few months that have shown that her blood sugars have been elevated and most recently 1 month ago was 252 on a random blood draw.  She has never been diagnosed with diabetes in the past although it does run in her family.  Hyperlipidemia and CAD Patient is coming in for recheck of his hyperlipidemia. The patient is currently taking atorvastatin. They deny any issues with myalgias or history of liver damage from it. They deny any focal numbness or weakness or chest pain.   Pulmonary edema Patient is coming in for COPD recheck today.  He is currently on Advair and albuterol and Spiriva.  He has a mild chronic cough but denies any major coughing spells or wheezing spells.  He has 0 nighttime symptoms per week and 0 daytime symptoms per week currently.  She feels like her breathing is doing pretty well right now, she sent more having issues with arthritis.  Relevant past medical, surgical, family and social history reviewed and updated as indicated. Interim medical history since our last visit reviewed. Allergies and medications reviewed and updated.  Review of Systems  Constitutional:  Negative for chills and fever.  Eyes:  Negative for visual disturbance.  Respiratory:  Negative for chest tightness and shortness of breath.   Cardiovascular:  Negative for chest pain and leg swelling.  Musculoskeletal:  Negative for back pain and gait problem.  Skin:  Negative for rash.  Neurological:  Negative for light-headedness and headaches.  Psychiatric/Behavioral:   Negative for agitation, behavioral problems, self-injury, sleep disturbance and suicidal ideas.   All other systems reviewed and are negative.  Per HPI unless specifically indicated above   Allergies as of 10/27/2020       Reactions   Penicillins Hives   Bee Venom Swelling   Prednisone Other (See Comments)   Patient did not like how it made her feel, caused jittery feeling.    Amoxicillin-pot Clavulanate Rash   Doxycycline Hyclate Rash   Rash and itching        Medication List        Accurate as of October 27, 2020  9:18 AM. If you have any questions, ask your nurse or doctor.          STOP taking these medications    azithromycin 250 MG tablet Commonly known as: ZITHROMAX Stopped by: Fransisca Kaufmann Sharifa Bucholz, MD       TAKE these medications    Advair Diskus 100-50 MCG/ACT Aepb Generic drug: fluticasone-salmeterol inhale ONE PUFF into THE lungs IN THE MORNING AND IN THE EVENING What changed: See the new instructions.   albuterol 108 (90 Base) MCG/ACT inhaler Commonly known as: Ventolin HFA INHALE TWO PUFFS INTO THE LUNGS EVERY 6 HOURS AS NEEDED FOR WHEEZING OR SHORTNESS OF BREATH   aspirin 81 MG EC tablet Take 1 tablet (81 mg total) by mouth daily.   atorvastatin 40 MG tablet Commonly known as: LIPITOR TAKE 1 TABLET BY MOUTH  DAILY AT 6PM   carvedilol 3.125 MG tablet Commonly known as: COREG TAKE 1 TABLET BY MOUTH TWICE DAILY WITH A MEAL What changed: additional instructions   diclofenac Sodium 1 % Gel Commonly known as: Voltaren Apply 2 g topically 4 (four) times daily.   folic acid 1 MG tablet Commonly known as: FOLVITE Take 1 tablet (1 mg total) by mouth daily.   HAIR/SKIN/NAILS PO Take by mouth.   losartan 25 MG tablet Commonly known as: COZAAR TAKE 1 TABLET BY MOUTH EVERY DAY   meloxicam 15 MG tablet Commonly known as: MOBIC Take 1 tablet (15 mg total) by mouth daily.   methotrexate 2.5 MG tablet Commonly known as: RHEUMATREX Take 4  tabs po once weekly for 2 weeks, if labs are stable increase to 6 tabs po once weekly. Caution:Chemotherapy. Protect from light.   nitroGLYCERIN 0.4 MG SL tablet Commonly known as: NITROSTAT Place 1 tablet (0.4 mg total) under the tongue every 5 (five) minutes as needed for chest pain.   omeprazole 20 MG tablet Commonly known as: PRILOSEC OTC Take 20 mg by mouth 2 (two) times daily.   Spiriva HandiHaler 18 MCG inhalation capsule Generic drug: tiotropium Place 1 capsule (18 mcg total) into inhaler and inhale daily.   TYLENOL 8 HOUR ARTHRITIS PAIN PO Take by mouth as needed.   Vimpat 150 MG Tabs Generic drug: Lacosamide Take 2 tablets by mouth 2 (two) times daily.   zonisamide 100 MG capsule Commonly known as: ZONEGRAN Take 2 capsules by mouth at bedtime.         Objective:   BP (!) 148/83   Pulse 65   Ht 5' 3.5" (1.613 m)   Wt 238 lb (108 kg)   SpO2 98%   BMI 41.50 kg/m   Wt Readings from Last 3 Encounters:  10/27/20 238 lb (108 kg)  10/10/20 240 lb (108.9 kg)  09/29/20 235 lb (106.6 kg)    Physical Exam Vitals and nursing note reviewed.  Constitutional:      General: She is not in acute distress.    Appearance: She is well-developed. She is not diaphoretic.  Eyes:     Conjunctiva/sclera: Conjunctivae normal.  Cardiovascular:     Rate and Rhythm: Normal rate and regular rhythm.     Heart sounds: Normal heart sounds. No murmur heard. Pulmonary:     Effort: Pulmonary effort is normal. No respiratory distress.     Breath sounds: Normal breath sounds. No wheezing.  Musculoskeletal:        General: No tenderness. Normal range of motion.  Skin:    General: Skin is warm and dry.     Findings: No rash.  Neurological:     Mental Status: She is alert and oriented to person, place, and time.     Coordination: Coordination normal.  Psychiatric:        Behavior: Behavior normal.      Assessment & Plan:   Problem List Items Addressed This Visit        Cardiovascular and Mediastinum   Essential hypertension (Chronic)   Coronary artery disease involving native coronary artery of native heart with angina pectoris (HCC) (Chronic)   Relevant Orders   CBC with Differential/Platelet     Other   Hyperlipidemia with target LDL less than 70 -> CAD (Chronic)   Relevant Orders   Lipid panel   Other Visit Diagnoses     Type 2 diabetes mellitus with other specified complication, without long-term current use of insulin (  [place])    -  Primary   Relevant Orders   Bayer DCA Hb A1c Waived   CBC with Differential/Platelet   CMP14+EGFR   Postmenopausal       Relevant Orders   DG [facility] DEXA       Will check A1c today and see where it is at and then discussed medications from there for her blood sugar. As far as her blood pressure and everything else no other changes currently.  A1c was 6.5 which shows she does have diabetes, right on the border, no medicine for it and mainly focus on diet.  Discussed this with her son prior to leaving Follow up plan: Return in about 3 months (around 01/26/2021), or if symptoms worsen or fail to improve, for Diabetes and hypertension and CAD recheck.  Counseling provided for all of the vaccine components Orders Placed This Encounter  Procedures   DG [facility] DEXA   Bayer [place] Hb A1c Waived   CBC with Differential/Platelet   CMP14+EGFR   Lipid panel    Caryl Pina, MD [facility] 10/27/2020, 9:18 AM

## 2020-11-02 DIAGNOSIS — M85851 Other specified disorders of bone density and structure, right thigh: Secondary | ICD-10-CM | POA: Diagnosis not present

## 2020-11-02 DIAGNOSIS — Z780 Asymptomatic menopausal state: Secondary | ICD-10-CM | POA: Diagnosis not present

## 2020-11-07 NOTE — Progress Notes (Signed)
Office Visit Note  Patient: Courtney Mcconnell             Date of Birth: 05-09-1955           MRN: [id number]             PCP: Dettinger, Fransisca Kaufmann, MD Referring: Dettinger, Fransisca Kaufmann, MD Visit Date: 11/21/2020 Occupation: _0 @  Subjective:  Medication monitoring   History of Present Illness: Courtney Mcconnell is a 65 y.o. female with history of seropositive rheumatoid arthritis and osteoarthritis.  She is currently taking methotrexate 4 tablets once weekly and folic acid 1 mg daily as prescribed.  She initiated therapy after her last office visit on 10/10/2020.  She has not increased the dose of methotrexate to 6 tablets as advised due to not wanting to have to take more pills on a weekly basis.  She has been tolerating methotrexate without any side effects.  She has not noticed any improvement in her joint pain and inflammation since starting on methotrexate.  She has been having to take Tylenol arthritis 2-3 times per day for pain relief.  Her pain is most severe in the right knee joint but she is also having some pain and inflammation in the left knee and right ankle.   Activities of Daily Living:  Patient reports morning stiffness for 30 minutes.   Patient Reports nocturnal pain.  Difficulty dressing/grooming: Reports Difficulty climbing stairs: Reports Difficulty getting out of chair: Reports Difficulty using hands for taps, buttons, cutlery, and/or writing: Denies  Review of Systems  Constitutional:  Positive for fatigue.  HENT:  Positive for mouth dryness. Negative for mouth sores and nose dryness.   Eyes:  Positive for dryness. Negative for pain and visual disturbance.  Respiratory:  Negative for cough, hemoptysis and difficulty breathing.   Cardiovascular:  Positive for swelling in legs/feet. Negative for chest pain, palpitations and hypertension.  Gastrointestinal:  Positive for constipation and diarrhea. Negative for blood in stool.  Endocrine: Positive for cold intolerance,  heat intolerance, excessive thirst and increased urination.  Genitourinary:  Negative for difficulty urinating and painful urination.  Musculoskeletal:  Positive for joint pain, gait problem, joint pain, joint swelling, muscle weakness, morning stiffness and muscle tenderness. Negative for myalgias and myalgias.  Skin:  Negative for color change, pallor, rash, hair loss, nodules/bumps, skin tightness, ulcers and sensitivity to sunlight.  Allergic/Immunologic: Negative for susceptible to infections.  Neurological:  Positive for numbness. Negative for dizziness and headaches.  Hematological:  Positive for bruising/bleeding tendency. Negative for swollen glands.  Psychiatric/Behavioral:  Positive for sleep disturbance. Negative for depressed mood. The patient is not nervous/anxious.    PMFS History:  Patient Active Problem List   Diagnosis Date Noted   Unilateral primary osteoarthritis, right knee 10/05/2020   Coronary artery disease involving native coronary artery of native heart with angina pectoris ([place]) 05/22/2019   Hyperlipidemia with target LDL less than 70 -> CAD 02/12/2019   Presence of drug coated stent in LAD coronary artery 02/11/2019   History of non-ST elevation myocardial infarction (NSTEMI) 02/10/2019   Cerebral infarction, remote, resolved 06/27/2018   Pulmonary nodules 12/08/2015   Sleep apnea 04/06/2015   Pulmonary emphysema ([place]) 03/02/2015   Smoker 02/16/2015   Seizure disorder ([place]) 01/21/2015   Focal epilepsy with impairment of consciousness ([place]) 12/13/2014   Essential hypertension 04/18/2014   Preretinal fibrosis, 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   Tobacco use disorder 07/19/2012    Past Medical History:  Diagnosis Date   Asthma  Bursitis    Hip   CAD (coronary artery disease) 02/11/2019   PCI/DES x1 to mLAD, focal ostial 70% lesion in 1st diag   COPD (chronic obstructive pulmonary disease) (HCC)    Essential hypertension    GERD (gastroesophageal reflux disease)     Headache(784.0)    Hyperlipidemia    NSTEMI (non-ST elevated myocardial infarction) ([place]) 02/11/2019   Pneumonia    Seizure ([place])    Sleep apnea     Family History  Problem Relation Age of Onset   COPD Mother    Diabetes Father    Hypertension Father    Hyperlipidemia Father    Cancer Sister        Bone Cancer   Diabetes Brother    Cancer Brother    Cancer Paternal Uncle    Stroke Paternal Grandmother    Healthy Daughter    Healthy Son    Colon cancer Neg Hx    Past Surgical History:  Procedure Laterality Date   ABDOMINAL HYSTERECTOMY     APPENDECTOMY     CATARACT EXTRACTION Bilateral    CHOLECYSTECTOMY     CORONARY STENT INTERVENTION N/A 02/11/2019   Procedure: CORONARY STENT INTERVENTION;  Surgeon: Burnell Blanks, MD;  Location: [facility];; mid LAD 70% -- DES PCI (Synergy DES 2.5 x 28 --2.8 mm)   heal spur     right   INTRAVASCULAR PRESSURE WIRE/FFR STUDY N/A 02/11/2019   Procedure: INTRAVASCULAR PRESSURE WIRE/FFR STUDY;  Surgeon: Burnell Blanks, MD;  Location: [facility];  Service: Cardiovascular;  Laterality: N/A;   LASER PHOTO ABLATION Right 08/27/2013   Procedure: LASER PHOTO ABLATION;  Surgeon: Hayden Pedro, MD;  Location: [facility];  Service: Ophthalmology;  Laterality: Right;  Headscope laser AND Endolaser   LEFT HEART CATH AND CORONARY ANGIOGRAPHY N/A 02/11/2019   Procedure: LEFT HEART CATH AND CORONARY ANGIOGRAPHY;  Surgeon: Burnell Blanks, MD;  Location: [facility];;; prox RCA 20%. Ost-prox Cx 20%. ostial D1 70% & mLAD 70% => (DES PCI of LAD)   MEMBRANE PEEL Right 08/27/2013   Procedure: MEMBRANE PEEL;  Surgeon: Hayden Pedro, MD;  Location: [facility];  Service: Ophthalmology;  Laterality: Right;   PARS PLANA VITRECTOMY Right 08/27/2013   Procedure: PARS PLANA VITRECTOMY WITH 25 GAUGE;  Surgeon: Hayden Pedro, MD;  Location: [facility];  Service: Ophthalmology;  Laterality: Right;   Social History   Social History  Narrative   Patient lives at home with family.   Caffeine Use: 1 pot and a half a day   Immunization History  Administered Date(s) Administered   MMR 08/26/2007   Td 08/11/2007   Tdap 08/12/2014     Objective: Vital Signs: Wt 240 lb 6.4 oz (109 kg)   BMI 41.92 kg/m    Physical Exam Vitals and nursing note reviewed.  Constitutional:      Appearance: She is well-developed.  HENT:     Head: Normocephalic and atraumatic.  Eyes:     Conjunctiva/sclera: Conjunctivae normal.  Pulmonary:     Effort: Pulmonary effort is normal.  Abdominal:     Palpations: Abdomen is soft.  Musculoskeletal:     Cervical back: Normal range of motion.  Skin:    General: Skin is warm and dry.     Capillary Refill: Capillary refill takes less than 2 seconds.  Neurological:     Mental Status: She is alert and oriented to person, place, and time.  Psychiatric:  Behavior: Behavior normal.     Musculoskeletal Exam: C-spine has good range of motion with no discomfort.  No midline spinal tenderness or SI joint tenderness noted.  Shoulder joints, elbow joints, wrist joints, MCPs, PIPs, DIPs have good range of motion with no synovitis.  PIP and DIP thickening consistent with osteoarthritis of both hands.  Hip joints had limited range of motion bilaterally.  Painful range of motion of both knee joints especially her right knee.  Warmth and inflammation in the right knee noted.  Warmth of the left knee noted.  Tenderness and swelling of the right ankle noted.  No tenderness or swelling over the left ankle joint.  CDAI Exam: CDAI Score: 4.5  Patient Global: 10 mm; Provider Global: 5 mm Swollen: 3 ; Tender: 2  Joint Exam 11/21/2020      Right  Left  Knee  Swollen Tender  Swollen   Ankle  Swollen Tender        Investigation: No additional findings.  Imaging: DG [facility] DEXA  Result Date: 11/02/2020 CLINICAL DATA:  Postmenopausal. EXAM: DUAL X-RAY ABSORPTIOMETRY (DXA) FOR BONE MINERAL DENSITY  TECHNIQUE: Bone mineral density measurements are performed of the spine, hip, and forearm, as appropriate, per International Society of Clinical Densitometry recommendations. The pertinent regions of interest are reported below. Non-contributory values are not reported. Images are obtained for bone mineral density measurement and are not obtained for diagnostic purposes. FINDINGS: LEFT FOREARM (1/3 RADIUS) Bone Mineral Density (BMD):  0.897 Young Adult T Score:  0.1 Z Score:  1.5 RIGHT FEMUR NECK Bone Mineral Density (BMD):  0.821 g/cm2 Young Adult T-Score: -1.6 Z-Score:  -0.9 Unit: This study was performed at [place] on a [employer]. Scan quality: The scan quality is good. Exclusions: Lumbar spine excluded due to degenerative change. ASSESSMENT: Patient's diagnostic category is LOW BONE MASS/OSTEOPENIA by [facility] Criteria. FRACTURE RISK: INCREASED FRAX: Based on the [place] model, the 10 year probability of a major osteoporotic fracture is 7.9%. The 10 year probability of a hip fracture is 0.8%. COMPARISON: None. RECOMMENDATIONS 1. All patients should optimize calcium and vitamin D intake. 2. Consider FDA-approved medical therapies in postmenopausal women and men aged 85 years and older, based on the following: - A hip or vertebral (clinical or morphometric) fracture - T-score less than or equal to -2.5 at the femoral neck or spine after appropriate evaluation to exclude secondary causes - Low bone mass (T-score between -1.0 and -2.5 at the femoral neck or spine) and a 10-year probability of a hip fracture greater than or equal to 3% or a 10-year probability of a major osteoporosis-related fracture greater than or equal to 20% based on the [place]-adapted WHO algorithm - Clinician judgment and/or patient preferences may indicate treatment for people with 10-year fracture probabilities above or below these levels 3. Patients with diagnosis of osteoporosis or at high  risk for fracture should have regular bone mineral density tests. For patients eligible for Medicare, routine testing is allowed once every 2 years. The testing frequency can be increased to one year for patients who have rapidly progressing disease, those who are receiving or discontinuing medical therapy to restore bone mass, or have additional risk factors. Electronically Signed   By: Lajean Manes M.D.   On: 11/02/2020 13:12    Recent Labs: Lab Results  Component Value Date   WBC 8.0 10/27/2020   HGB 13.3 10/27/2020   PLT 246 10/27/2020   NA 139 10/27/2020  K 4.3 10/27/2020   CL 103 10/27/2020   CO2 22 10/27/2020   GLUCOSE 129 (H) 10/27/2020   BUN 9 10/27/2020   CREATININE 0.90 10/27/2020   BILITOT 0.3 10/27/2020   ALKPHOS 121 10/27/2020   AST 11 10/27/2020   ALT 6 10/27/2020   PROT 6.5 10/27/2020   ALBUMIN 4.1 10/27/2020   CALCIUM 9.2 10/27/2020   GFRAA 87 03/03/2020   QFTBGOLDPLUS NEGATIVE 09/15/2020    Speciality Comments: No specialty comments available.  Procedures:  No procedures performed Allergies: Penicillins, Bee venom, Prednisone, Amoxicillin-pot clavulanate, and Doxycycline hyclate   Assessment / Plan:     Visit Diagnoses: Rheumatoid arthritis involving multiple sites with positive rheumatoid factor (HCC) - +RF,+anti-CCP, -ANA, H/o multiple knee joint aspirations: She presents today with ongoing pain and inflammation in both knee joints and the right ankle.  Warmth and swelling in her right knee and right ankle joint were noted.  She was started on methotrexate 4 tablets by mouth once weekly and folic acid 1 mg by mouth daily after her last office visit on 10/10/2020.  She has not increased the dose of methotrexate to 6 tablets weekly as discussed at her last office visit due to not wanting to take that many pills on a weekly basis.  She has been tolerating methotrexate without any side effects.  She has not noticed any clinical improvement since starting therapy.   She is planning on proceeding with a right knee replacement by Dr. Lorin Mercy as soon as he has an appointment available.  Discussed that he will have to come off of methotrexate at least 1 week prior to surgery and will need clearance by Dr. Lorin Mercy to resume methotrexate during the postoperative period.  She voiced understanding.  She does not want to increase the dose of methotrexate to 6 tablets at this time.  She was advised to notify us if she develops increased joint pain or joint swelling.  She will follow up in 3 months.     High risk medication use - Methotrexate 4 tablets by mouth once weekly and folic acid 1 mg daily.  She declined increasing the dose of methotrexate to 6 tablets weekly.  CBC and CMP updated on 10/27/20.  She will continue to require lab work every 3 months to monitor for drug toxicity.  Standing orders for CBC and CMP were placed today.  - Plan: CBC with Differential/Platelet, COMPLETE METABOLIC PANEL WITH GFR She has not had any recent infections.  Discussed the importance of holding MTX if she develops signs or symptoms of an infections.  She will need to hold methotrexate at least 1 week prior to having her right knee replaced.  She will require clearance by Dr. Lorin Mercy prior to resuming methotrexate during the postoperative period.  Primary osteoarthritis of both hands - Clinical and radiographic findings are consistent with osteoarthritis.  No tenderness or synovitis was noted.  Complete fist formation bilaterally.  Primary osteoarthritis of both knees - Recurrent aspirations in the past.  Severe osteoarthritis and bilateral severe chondromalacia patella.  She has severe pain in both knee joints especially her right knee.  She has difficulty ambulating due to the severity of pain and stiffness.  She has been using a cane to assist with ambulation.  She is planning on proceeding with a right knee replacement performed by Dr. Lorin Mercy.   Primary osteoarthritis of both feet: She has  tenderness and inflammation of the right ankle joint on examination today.  Left ankle has good range  of motion with no tenderness or inflammation.  She is not experiencing any pain in her toes at this time.  DDD (degenerative disc disease), cervical: She has good range of motion with no discomfort at this time.  No symptoms of radiculopathy.  Other medical conditions are listed as follows:   Coronary artery disease involving native coronary artery of native heart with angina pectoris ([place])  Essential hypertension  History of non-ST elevation myocardial infarction (NSTEMI)  Hyperlipidemia with target LDL less than 70 -> CAD  Presence of drug coated stent in LAD coronary artery  Cerebral infarction, remote, resolved  Preretinal fibrosis, right eye  Pulmonary nodules  Focal epilepsy with impairment of consciousness ([place])  History of emphysema  Former smoker - 1 pack/day for 45 years, she quit smoking at age 22.  Orders: Orders Placed This Encounter  Procedures   CBC with Differential/Platelet   COMPLETE METABOLIC PANEL WITH GFR    No orders of the defined types were placed in this encounter.    Follow-Up Instructions: Return in about 3 months (around 02/21/2021) for Rheumatoid arthritis, Osteoarthritis.   Ofilia Neas, PA-C  Note - This record has been created using Dragon software.  Chart creation errors have been sought, but may not always  have been located. Such creation errors do not reflect on  the standard of medical care.

## 2020-11-10 ENCOUNTER — Other Ambulatory Visit: Payer: Self-pay | Admitting: Rheumatology

## 2020-11-10 NOTE — Telephone Encounter (Signed)
Next Visit: 11/21/2020  Last Visit: 10/10/2020  Last Fill: 10/10/2020  DX: Rheumatoid arthritis involving multiple sites with positive rheumatoid factor   Current Dose per office note 10/10/2020: We will start her on methotrexate 4 tablets p.o. weekly and increase it to 6 tablets p.o. weekly after 2 weeks if labs are stable.    Labs: 10/27/2020 MCV 98, Glucose 129, BUN/Creat. Ratio 10  Spoke with patient's daughter and she states patient is on MTX 4 tabs. Patient did not increase to 6 tabs weekly as she had nausea on 4 tabs.   Okay to refill MTX?

## 2020-11-14 ENCOUNTER — Other Ambulatory Visit: Payer: Self-pay | Admitting: Cardiology

## 2020-11-14 ENCOUNTER — Other Ambulatory Visit: Payer: Self-pay | Admitting: Family Medicine

## 2020-11-14 DIAGNOSIS — J440 Chronic obstructive pulmonary disease with acute lower respiratory infection: Secondary | ICD-10-CM

## 2020-11-14 DIAGNOSIS — J209 Acute bronchitis, unspecified: Secondary | ICD-10-CM

## 2020-11-21 ENCOUNTER — Ambulatory Visit (INDEPENDENT_AMBULATORY_CARE_PROVIDER_SITE_OTHER): Payer: Medicare Other | Admitting: Physician Assistant

## 2020-11-21 ENCOUNTER — Other Ambulatory Visit: Payer: Self-pay

## 2020-11-21 ENCOUNTER — Encounter: Payer: Self-pay | Admitting: Physician Assistant

## 2020-11-21 VITALS — BP 165/79 | HR 68 | Resp 16 | Ht 66.0 in | Wt 240.4 lb

## 2020-11-21 DIAGNOSIS — M19042 Primary osteoarthritis, left hand: Secondary | ICD-10-CM

## 2020-11-21 DIAGNOSIS — M0579 Rheumatoid arthritis with rheumatoid factor of multiple sites without organ or systems involvement: Secondary | ICD-10-CM

## 2020-11-21 DIAGNOSIS — E785 Hyperlipidemia, unspecified: Secondary | ICD-10-CM | POA: Diagnosis not present

## 2020-11-21 DIAGNOSIS — I10 Essential (primary) hypertension: Secondary | ICD-10-CM | POA: Diagnosis not present

## 2020-11-21 DIAGNOSIS — M5030 Other cervical disc degeneration, unspecified cervical region: Secondary | ICD-10-CM | POA: Diagnosis not present

## 2020-11-21 DIAGNOSIS — I252 Old myocardial infarction: Secondary | ICD-10-CM

## 2020-11-21 DIAGNOSIS — I25119 Atherosclerotic heart disease of native coronary artery with unspecified angina pectoris: Secondary | ICD-10-CM | POA: Diagnosis not present

## 2020-11-21 DIAGNOSIS — Z8673 Personal history of transient ischemic attack (TIA), and cerebral infarction without residual deficits: Secondary | ICD-10-CM

## 2020-11-21 DIAGNOSIS — Z79899 Other long term (current) drug therapy: Secondary | ICD-10-CM

## 2020-11-21 DIAGNOSIS — M19041 Primary osteoarthritis, right hand: Secondary | ICD-10-CM | POA: Diagnosis not present

## 2020-11-21 DIAGNOSIS — M19071 Primary osteoarthritis, right ankle and foot: Secondary | ICD-10-CM

## 2020-11-21 DIAGNOSIS — M19072 Primary osteoarthritis, left ankle and foot: Secondary | ICD-10-CM

## 2020-11-21 DIAGNOSIS — M170 Bilateral primary osteoarthritis of knee: Secondary | ICD-10-CM | POA: Diagnosis not present

## 2020-11-21 DIAGNOSIS — Z87891 Personal history of nicotine dependence: Secondary | ICD-10-CM

## 2020-11-21 DIAGNOSIS — Z955 Presence of coronary angioplasty implant and graft: Secondary | ICD-10-CM

## 2020-11-21 DIAGNOSIS — H35371 Puckering of macula, right eye: Secondary | ICD-10-CM

## 2020-11-21 DIAGNOSIS — G40209 Localization-related (focal) (partial) symptomatic epilepsy and epileptic syndromes with complex partial seizures, not intractable, without status epilepticus: Secondary | ICD-10-CM

## 2020-11-21 DIAGNOSIS — Z8709 Personal history of other diseases of the respiratory system: Secondary | ICD-10-CM

## 2020-11-21 DIAGNOSIS — R918 Other nonspecific abnormal finding of lung field: Secondary | ICD-10-CM

## 2020-11-21 NOTE — Patient Instructions (Signed)
Standing Labs We placed an order today for your standing lab work.   Please have your standing labs drawn in December and every 3 months    If possible, please have your labs drawn 2 weeks prior to your appointment so that the provider can discuss your results at your appointment.  Please note that you may see your imaging and lab results in MyChart before we have reviewed them. We may be awaiting multiple results to interpret others before contacting you. Please allow our office up to 72 hours to thoroughly review all of the results before contacting the office for clarification of your results.  We have open lab daily: Monday through Thursday from 1:30-4:30 PM and Friday from 1:30-4:00 PM at the office of Dr. Shaili Deveshwar, [facility].   Please be advised, all patients with office appointments requiring lab work will take precedent over walk-in lab work.  If possible, please come for your lab work on Monday and Friday afternoons, as you may experience shorter wait times. The office is located at [address], [address], [place], [place] [address] No appointment is necessary.   Labs are drawn by [facility]. Please bring your co-pay at the time of your lab draw.  You may receive a bill from [facility] for your lab work.  If you wish to have your labs drawn at another location, please call the office 24 hours in advance to send orders.  If you have any questions regarding directions or hours of operation,  please call [phone].   As a reminder, please drink plenty of water prior to coming for your lab work. Thanks!  

## 2020-11-23 DIAGNOSIS — Z20828 Contact with and (suspected) exposure to other viral communicable diseases: Secondary | ICD-10-CM | POA: Diagnosis not present

## 2020-12-01 ENCOUNTER — Other Ambulatory Visit: Payer: Self-pay

## 2020-12-01 ENCOUNTER — Ambulatory Visit (INDEPENDENT_AMBULATORY_CARE_PROVIDER_SITE_OTHER): Payer: Medicare Other | Admitting: Orthopaedic Surgery

## 2020-12-01 VITALS — Ht 65.0 in | Wt 248.0 lb

## 2020-12-01 DIAGNOSIS — M1711 Unilateral primary osteoarthritis, right knee: Secondary | ICD-10-CM

## 2020-12-01 DIAGNOSIS — M0579 Rheumatoid arthritis with rheumatoid factor of multiple sites without organ or systems involvement: Secondary | ICD-10-CM | POA: Diagnosis not present

## 2020-12-01 DIAGNOSIS — I209 Angina pectoris, unspecified: Secondary | ICD-10-CM | POA: Diagnosis not present

## 2020-12-02 DIAGNOSIS — M0579 Rheumatoid arthritis with rheumatoid factor of multiple sites without organ or systems involvement: Secondary | ICD-10-CM | POA: Insufficient documentation

## 2020-12-02 NOTE — Progress Notes (Signed)
Office Visit Note   Patient: Courtney Mcconnell           Date of Birth: 08-09-1955           MRN: [id number] Visit Date: 12/01/2020              Requested by: Dettinger, Fransisca Kaufmann, MD [address] [place],  [place] [address] PCP: Dettinger, Fransisca Kaufmann, MD   Assessment & Plan: Visit Diagnoses:  1. Unilateral primary osteoarthritis, right knee   2. Rheumatoid arthritis with rheumatoid factor of multiple sites without organ or systems involvement ([place])     Plan: We discussed right total knee arthroplasty.  Discussed likely 2 night stay since she is a minimal ambulator with a walker has problems with her opposite left knee not as severe as the right.  She has rheumatoid arthritis involvement of multiple joints.  She need cardiac clearance.  We discussed spinal anesthesia preoperative abductor nerve block, Exparel, Marcaine, home health physical therapy for 2 weeks, outpatient therapy for several weeks after that.  Portance of working on range of motion and strengthening discussed risk of infection stiffness discussed.  Questions were elicited and answered.  Patient understands request to proceed.  Follow-Up Instructions: No follow-ups on file.   Orders:  No orders of the defined types were placed in this encounter.  No orders of the defined types were placed in this encounter.     Procedures: No procedures performed   Clinical Data: No additional findings.   Subjective: Chief Complaint  Patient presents with   Right Knee - Follow-up    Not doing good; gives out.    HPI 65 year old female with a severe right knee osteoarthritis.  Patient has seropositive rheumatoid arthritis involving multiple sites and her right knee x-rays show bone-on-bone medial compartment marginal osteophytes subchondral sclerosis and varus deformity more consistent with osteoarthritis but she may have combination of both.  She can walk only a few steps without using a walker.  Opposite knee on the left gives her  some problems with the right knee as severe and she returns today and states "I am ready to schedule my knee replacement".  Patient had history of non-STEMI.  Previous coronary stent.  History of CVA remote which resolved.  Patient referred here for treatment for right knee end-stage osteoarthritis by Dr. Warrick Parisian.  Foster smoking coronary artery disease previous coronary stent, remote CVA with no residual.  Positive for hypertension, COPD.  Seizure disorder sleep apnea.  She still smokes.  Positive hyperlipidemia.  She sees Dr. Cherlynn Kaiser [facility]/Dr.Lenze for cardiology.  Review of Systems all systems are noncontributory to HPI other than as mentioned above.   Objective: Vital Signs: Ht 5\' 5"  (1.651 m)   Wt 248 lb (112.5 kg)   BMI 41.27 kg/m   Physical Exam Constitutional:      Appearance: She is well-developed.  HENT:     Head: Normocephalic.     Right Ear: External ear normal.     Left Ear: External ear normal. There is no impacted cerumen.  Eyes:     Pupils: Pupils are equal, round, and reactive to light.  Neck:     Thyroid: No thyromegaly.     Trachea: No tracheal deviation.  Cardiovascular:     Rate and Rhythm: Normal rate.  Pulmonary:     Effort: Pulmonary effort is normal.  Abdominal:     Palpations: Abdomen is soft.  Musculoskeletal:     Cervical back: No rigidity.  Skin:  General: Skin is warm and dry.  Neurological:     Mental Status: She is alert and oriented to person, place, and time.  Psychiatric:        Behavior: Behavior normal.    Ortho Exam negative logroll to the right hip.  Right knee has limited range of motion 10 to 70 degrees with severe crepitus 2+ edema collateral ligaments are stable there is mild varus deformity greater than 10 degrees.  Distal pulses palpable.  Good capillary refill.  Specialty Comments:  No specialty comments available.  Imaging: Narrative & Impression  CLINICAL DATA:  Right knee pain and swelling.   EXAM: RIGHT KNEE -  1-2 VIEW   COMPARISON:  None.   FINDINGS: No evidence of fracture, dislocation, or joint effusion. Severe narrowing of medial joint space is noted with osteophyte formation. Moderate narrowing of patellofemoral space is noted with osteophyte formation. Soft tissues are unremarkable.   IMPRESSION: Moderate to severe degenerative joint disease. No acute abnormality seen.       PMFS History: Patient Active Problem List   Diagnosis Date Noted   Rheumatoid arthritis with rheumatoid factor of multiple sites without organ or systems involvement ([place]) 12/02/2020   Unilateral primary osteoarthritis, right knee 10/05/2020   Coronary artery disease involving native coronary artery of native heart with angina pectoris ([place]) 05/22/2019   Hyperlipidemia with target LDL less than 70 -> CAD 02/12/2019   Presence of drug coated stent in LAD coronary artery 02/11/2019   History of non-ST elevation myocardial infarction (NSTEMI) 02/10/2019   Cerebral infarction, remote, resolved 06/27/2018   Pulmonary nodules 12/08/2015   Sleep apnea 04/06/2015   Pulmonary emphysema ([place]) 03/02/2015   Smoker 02/16/2015   Seizure disorder ([place]) 01/21/2015   Focal epilepsy with impairment of consciousness ([place]) 12/13/2014   Essential hypertension 04/18/2014   Preretinal fibrosis, 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   Tobacco use disorder 07/19/2012   Past Medical History:  Diagnosis Date   Asthma    Bursitis    Hip   CAD (coronary artery disease) 02/11/2019   PCI/DES x1 to mLAD, focal ostial 70% lesion in 1st diag   COPD (chronic obstructive pulmonary disease) ([place])    Essential hypertension    GERD (gastroesophageal reflux disease)    Headache(784.0)    Hyperlipidemia    NSTEMI (non-ST elevated myocardial infarction) ([facility]) 02/11/2019   Pneumonia    Seizure ([place])    Sleep apnea     Family History  Problem Relation Age of Onset   COPD Mother    Diabetes Father    Hypertension Father    Hyperlipidemia Father     Cancer Sister        Bone Cancer   Diabetes Brother    Cancer Brother    Cancer Paternal Uncle    Stroke Paternal Grandmother    Healthy Daughter    Healthy Son    Colon cancer Neg Hx     Past Surgical History:  Procedure Laterality Date   ABDOMINAL HYSTERECTOMY     APPENDECTOMY     CATARACT EXTRACTION Bilateral    CHOLECYSTECTOMY     CORONARY STENT INTERVENTION N/A 02/11/2019   Procedure: CORONARY STENT INTERVENTION;  Surgeon: Burnell Blanks, MD;  [facility];; mid LAD 70% -- DES PCI (Synergy DES 2.5 x 28 --2.8 mm)   heal spur     right   INTRAVASCULAR PRESSURE WIRE/FFR STUDY N/A 02/11/2019   Procedure: INTRAVASCULAR PRESSURE WIRE/FFR STUDY;  Surgeon: Burnell Blanks, MD;  Location: [facility];  Service: Cardiovascular;  Laterality: N/A;   LASER PHOTO ABLATION Right 08/27/2013   Procedure: LASER PHOTO ABLATION;  Surgeon: Hayden Pedro, MD;  Location: [facility];  Service: Ophthalmology;  Laterality: Right;  Headscope laser AND Endolaser   LEFT HEART CATH AND CORONARY ANGIOGRAPHY N/A 02/11/2019   Procedure: LEFT HEART CATH AND CORONARY ANGIOGRAPHY;  Surgeon: Burnell Blanks, MD;  Location: [facility] INVASIVE CV LAB;;; prox RCA 20%. Ost-prox Cx 20%. ostial D1 70% & mLAD 70% => (DES PCI of LAD)   MEMBRANE PEEL Right 08/27/2013   Procedure: MEMBRANE PEEL;  Surgeon: Hayden Pedro, MD;  Location: [facility];  Service: Ophthalmology;  Laterality: Right;   PARS PLANA VITRECTOMY Right 08/27/2013   Procedure: PARS PLANA VITRECTOMY WITH 25 GAUGE;  Surgeon: Hayden Pedro, MD;  Location: [facility];  Service: Ophthalmology;  Laterality: Right;   Social History   Occupational History   Occupation: Airline pilot: [employer]  Tobacco Use   Smoking status: Former    Packs/day: 0.25    Years: 42.00    Pack years: 10.50    Types: Cigarettes    Quit date: 03/20/2014    Years since quitting: 6.7   Smokeless tobacco: Never   Tobacco comments:     01/21/15 restarted smoking 4 mos ago  Vaping Use   Vaping Use: Never used  Substance and Sexual Activity   Alcohol use: No    Alcohol/week: 0.0 standard drinks   Drug use: No   Sexual activity: Not on file

## 2020-12-13 ENCOUNTER — Telehealth: Payer: Self-pay | Admitting: *Deleted

## 2020-12-13 ENCOUNTER — Other Ambulatory Visit: Payer: Self-pay | Admitting: Cardiology

## 2020-12-13 NOTE — Telephone Encounter (Signed)
Pt has appt with Dr. Domenic Polite 12/22/20. Will forward clearance notes to MD for appt. Will send FYI to requesting office pt has appt.

## 2020-12-13 NOTE — Telephone Encounter (Signed)
   [facility] Pre-operative Risk Assessment    Patient Name: Courtney Mcconnell  DOB: 03-05-1955 MRN: [id number]  HEARTCARE STAFF:  - IMPORTANT!!!!!! Under Visit Info/Reason for Call, type in Other and utilize the format Clearance MM/DD/YY or Clearance TBD. Do not use dashes or single digits. - Please review there is not already an duplicate clearance open for this procedure. - If request is for dental extraction, please clarify the # of teeth to be extracted. - If the patient is currently at the dentist's office, call Pre-Op Callback Staff (MA/nurse) to input urgent request.  - If the patient is not currently in the dentist office, please route to the Pre-Op pool.  Request for surgical clearance:  What type of surgery is being performed? Right total knee arthroplasty  When is this surgery scheduled? TBD  What type of clearance is required (medical clearance vs. Pharmacy clearance to hold med vs. Both)? Cardiac/medical  Are there any medications that need to be held prior to surgery and how long? no  Practice name and name of physician performing surgery? [facility]/Dr. Rodell Perna  What is the office phone number? [phone]   7.   What is the office fax number? [phone]  8.   Anesthesia type (None, local, MAC, general) ? Spinal+Block   Marlou Sa 12/13/2020, 3:18 PM  _________________________________________________________________   (provider comments below)

## 2020-12-13 NOTE — Telephone Encounter (Signed)
   Name: Courtney Mcconnell  DOB: 10/29/1955  MRN: [id number]  Primary Cardiologist: Rozann Lesches, MD  Chart reviewed as part of pre-operative protocol coverage. Because of Kaisha W Larmon's past medical history and time since last visit, she will require a follow-up visit in order to better assess preoperative cardiovascular risk.   Pt has an appt on 12/22/20. I was able to speak with her daughter. She is not mobile and can't complete 4.0 METS. She had a reassuring stress test 10/12/20. They would like to complete clearance at their visit with Dr. Domenic Polite next week.    Pre-op covering staff: - Please schedule appointment and call patient to inform them. If patient already had an upcoming appointment within acceptable timeframe, please add "pre-op clearance" to the appointment notes so provider is aware. - Please contact requesting surgeon's office via preferred method (i.e, phone, fax) to inform them of need for appointment prior to surgery.  If applicable, this message will also be routed to pharmacy pool and/or primary cardiologist for input on holding anticoagulant/antiplatelet agent as requested below so that this information is available to the clearing provider at time of patient's appointment.   Ledora Bottcher, PA  12/13/2020, 4:36 PM

## 2020-12-21 ENCOUNTER — Other Ambulatory Visit: Payer: Self-pay | Admitting: Cardiology

## 2020-12-21 NOTE — Progress Notes (Signed)
Cardiology Office Note  Date: 12/22/2020   ID: [name], Mcconnell 1956/01/20, MRN [id number]  PCP:  Dettinger, Fransisca Kaufmann, MD  Cardiologist:  Rozann Lesches, MD Electrophysiologist:  None   Chief Complaint  Patient presents with   Cardiac follow-up     History of Present Illness: Courtney Mcconnell is a 65 y.o. female last seen in August by Ms. Bonnell Public PA-C.  She presents to the office for follow-up and preoperative cardiac clearance.  She is being considered for right total knee arthroplasty under possible spinal anesthesia with Dr. Lorin Mercy.  She does not report any active angina symptoms or increasing nitroglycerin use. She underwent a follow-up Lexiscan Myoview in August that was low risk as noted below.  I reviewed her current medications which are listed below.  No change in cardiac regimen, she did need some refills.  Systolic blood pressure 710 today, had been better controlled at last cardiology visit.  She has been having a lot of knee pain recently.  I asked her to check blood pressure periodically at home to make sure the trend is not up overall.  She reports compliance with her medications.  RCRI cardiac risk calculator indicates class II, 0.9% chance of major adverse cardiac event in the perioperative setting.  Past Medical History:  Diagnosis Date   Asthma    Bursitis    Hip   CAD (coronary artery disease) 02/11/2019   PCI/DES x1 to mLAD, focal ostial 70% lesion in 1st diag   COPD (chronic obstructive pulmonary disease) ([facility])    Essential hypertension    GERD (gastroesophageal reflux disease)    Headache(784.0)    Hyperlipidemia    NSTEMI (non-ST elevated myocardial infarction) ([place]) 02/11/2019   Pneumonia    Seizure ([place])    Sleep apnea     Past Surgical History:  Procedure Laterality Date   ABDOMINAL HYSTERECTOMY     APPENDECTOMY     CATARACT EXTRACTION Bilateral    CHOLECYSTECTOMY     CORONARY STENT INTERVENTION N/A 02/11/2019   Procedure: CORONARY STENT  INTERVENTION;  Surgeon: Burnell Blanks, MD;  [facility];; mid LAD 70% -- DES PCI (Synergy DES 2.5 x 28 --2.8 mm)   heal spur     right   INTRAVASCULAR PRESSURE WIRE/FFR STUDY N/A 02/11/2019   Procedure: INTRAVASCULAR PRESSURE WIRE/FFR STUDY;  Surgeon: Burnell Blanks, MD;  Location: [facility];  Service: Cardiovascular;  Laterality: N/A;   LASER PHOTO ABLATION Right 08/27/2013   Procedure: LASER PHOTO ABLATION;  Surgeon: Hayden Pedro, MD;  Location: [facility];  Service: Ophthalmology;  Laterality: Right;  Headscope laser AND Endolaser   LEFT HEART CATH AND CORONARY ANGIOGRAPHY N/A 02/11/2019   Procedure: LEFT HEART CATH AND CORONARY ANGIOGRAPHY;  Surgeon: Burnell Blanks, MD;  Location: [facility] INVASIVE CV LAB;;; prox RCA 20%. Ost-prox Cx 20%. ostial D1 70% & mLAD 70% => (DES PCI of LAD)   MEMBRANE PEEL Right 08/27/2013   Procedure: MEMBRANE PEEL;  Surgeon: Hayden Pedro, MD;  Location: [facility];  Service: Ophthalmology;  Laterality: Right;   PARS PLANA VITRECTOMY Right 08/27/2013   Procedure: PARS PLANA VITRECTOMY WITH 25 GAUGE;  Surgeon: Hayden Pedro, MD;  Location: [facility];  Service: Ophthalmology;  Laterality: Right;    Current Outpatient Medications  Medication Sig Dispense Refill   Acetaminophen (TYLENOL 8 HOUR ARTHRITIS PAIN PO) Take by mouth as needed.     ADVAIR DISKUS 100-50 MCG/DOSE AEPB inhale ONE PUFF  into THE lungs IN THE MORNING AND IN THE EVENING (Patient taking differently: Inhale 1 puff into the lungs 2 (two) times daily.) 60 each 2   albuterol (VENTOLIN HFA) 108 (90 Base) MCG/ACT inhaler INHALE TWO PUFFS INTO THE LUNGS EVERY 6 HOURS AS NEEDED FOR WHEEZING OR SHORTNESS OF BREATH 18 g 0   aspirin EC 81 MG EC tablet Take 1 tablet (81 mg total) by mouth daily. 90 tablet 1   Biotin w/ Vitamins C & E (HAIR/SKIN/NAILS PO) Take by mouth.     carvedilol (COREG) 3.125 MG tablet TAKE 1 TABLET BY MOUTH TWICE DAILY WITH A MEAL 60 tablet 11    diclofenac Sodium (VOLTAREN) 1 % GEL Apply 2 g topically 4 (four) times daily. 694 g 3   folic acid (FOLVITE) 1 MG tablet Take 1 tablet (1 mg total) by mouth daily. 90 tablet 2   Lacosamide (VIMPAT) 150 MG TABS Take 2 tablets by mouth 2 (two) times daily.     losartan (COZAAR) 25 MG tablet TAKE 1 TABLET BY MOUTH EVERY DAY 60 tablet 5   meloxicam (MOBIC) 15 MG tablet Take 1 tablet (15 mg total) by mouth daily. 30 tablet 0   nitroGLYCERIN (NITROSTAT) 0.4 MG SL tablet Place 1 tablet (0.4 mg total) under the tongue every 5 (five) minutes as needed for chest pain. 30 tablet 5   omeprazole (PRILOSEC OTC) 20 MG tablet Take 20 mg by mouth 2 (two) times daily.     SPIRIVA HANDIHALER 18 MCG inhalation capsule INHALE 1 PUFF BY MOUTH DAILY 90 capsule 0   zonisamide (ZONEGRAN) 100 MG capsule Take 2 capsules by mouth at bedtime.     atorvastatin (LIPITOR) 40 MG tablet TAKE 1 TABLET BY MOUTH DAILY AT 6PM 90 tablet 3   No current facility-administered medications for this visit.   Allergies:  Penicillins, Bee venom, Prednisone, Amoxicillin-pot clavulanate, and Doxycycline hyclate   ROS: Chronic knee pain, right worse than left.  Physical Exam: VS:  BP (!) 150/72 (BP Location: Left Arm, Patient Position: Sitting, Cuff Size: Large)   Pulse 60   Ht 5\' 5"  (1.651 m)   Wt 244 lb 9.6 oz (110.9 kg)   SpO2 98%   BMI 40.70 kg/m , BMI Body mass index is 40.7 kg/m.  Wt Readings from Last 3 Encounters:  12/22/20 244 lb 9.6 oz (110.9 kg)  12/01/20 248 lb (112.5 kg)  11/21/20 240 lb 6.4 oz (109 kg)    General: Patient appears comfortable at rest. HEENT: Conjunctiva and lids normal, wearing a mask. Neck: Supple, no elevated JVP or carotid bruits, no thyromegaly. Lungs: Clear to auscultation, nonlabored breathing at rest. Cardiac: Regular rate and rhythm, no S3 or significant systolic murmur, no pericardial rub. Extremities: No pitting edema.  ECG:  An ECG dated 09/27/2020 was personally reviewed today and  demonstrated:  Sinus rhythm with incomplete right bundle branch block.  Recent Labwork: 01/19/2020: Magnesium 2.5 10/27/2020: ALT 6; AST 11; BUN 9; Creatinine, Ser 0.90; Hemoglobin 13.3; Platelets 246; Potassium 4.3; Sodium 139     Component Value Date/Time   CHOL 145 10/27/2020 0919   TRIG 92 10/27/2020 0919   HDL 52 10/27/2020 0919   CHOLHDL 2.8 10/27/2020 0919   CHOLHDL 3.9 02/11/2019 0211   VLDL 12 02/11/2019 0211   LDLCALC 76 10/27/2020 0919   LDLDIRECT 151 (H) 07/06/2015 1604    Other Studies Reviewed Today:  Lexiscan Myoview 10/12/2020:   Findings are consistent with no significant ischemia. The study is low  risk.   No ST deviation was noted.   Defect 1: There is a small defect with mild reduction in uptake present in the apical anteroseptal location(s) that is fixed. There is normal wall motion in the defect area. Consistent with artifact caused by breast attenuation.   Left ventricular function is normal. End diastolic cavity size is normal. End systolic cavity size is normal.   Low risk study with LVEF 66%.  Breast attenuation artifact noted without significant ischemic territories.  Assessment and Plan:  1.  Preoperative cardiac evaluation in a 65 year old woman with a history of CAD, hypertension, and hyperlipidemia.  She is being considered for right total knee arthroplasty with Dr. Lorin Mercy.  RCRI cardiac risk calculator indicates class II, 0.9% chance of major adverse cardiac event in the perioperative setting.  She underwent ischemic testing in August of this year which was low risk.  She should be able to proceed with planned surgery at relatively low risk without further cardiac testing.  2.  CAD status post DES to the LAD in December 2020.  No progressive angina and status post low risk ischemic testing in August of this year.  Continue aspirin, losartan, Coreg, Lipitor, and as needed nitroglycerin.  3.  Mixed hyperlipidemia, on Lipitor.  Recent LDL 76.  Medication  Adjustments/Labs and Tests Ordered: Current medicines are reviewed at length with the patient today.  Concerns regarding medicines are outlined above.   Tests Ordered: No orders of the defined types were placed in this encounter.   Medication Changes: Meds ordered this encounter  Medications   atorvastatin (LIPITOR) 40 MG tablet    Sig: TAKE 1 TABLET BY MOUTH DAILY AT 6PM    Dispense:  90 tablet    Refill:  3     Disposition:  Follow up  6 months.  Signed, Satira Sark, MD, [facility] 12/22/2020 10:41 AM    [facility] [place], [place], [place] [address] Phone: [phone]; Fax: [phone]

## 2020-12-22 ENCOUNTER — Encounter: Payer: Self-pay | Admitting: Cardiology

## 2020-12-22 ENCOUNTER — Other Ambulatory Visit: Payer: Self-pay | Admitting: Cardiology

## 2020-12-22 ENCOUNTER — Other Ambulatory Visit: Payer: Self-pay

## 2020-12-22 ENCOUNTER — Ambulatory Visit (INDEPENDENT_AMBULATORY_CARE_PROVIDER_SITE_OTHER): Payer: Medicare Other | Admitting: Cardiology

## 2020-12-22 VITALS — BP 150/72 | HR 60 | Ht 65.0 in | Wt 244.6 lb

## 2020-12-22 DIAGNOSIS — E782 Mixed hyperlipidemia: Secondary | ICD-10-CM | POA: Diagnosis not present

## 2020-12-22 DIAGNOSIS — Z01810 Encounter for preprocedural cardiovascular examination: Secondary | ICD-10-CM

## 2020-12-22 DIAGNOSIS — I209 Angina pectoris, unspecified: Secondary | ICD-10-CM

## 2020-12-22 DIAGNOSIS — Z20828 Contact with and (suspected) exposure to other viral communicable diseases: Secondary | ICD-10-CM | POA: Diagnosis not present

## 2020-12-22 DIAGNOSIS — I25119 Atherosclerotic heart disease of native coronary artery with unspecified angina pectoris: Secondary | ICD-10-CM

## 2020-12-22 MED ORDER — ATORVASTATIN CALCIUM 40 MG PO TABS: ORAL_TABLET | ORAL | 3 refills | Status: DC

## 2020-12-22 NOTE — Patient Instructions (Addendum)

## 2020-12-23 ENCOUNTER — Ambulatory Visit (INDEPENDENT_AMBULATORY_CARE_PROVIDER_SITE_OTHER): Payer: Medicare Other | Admitting: Family Medicine

## 2020-12-23 ENCOUNTER — Encounter: Payer: Self-pay | Admitting: Family Medicine

## 2020-12-23 VITALS — BP 142/77 | HR 66 | Temp 97.2°F | Ht 65.0 in | Wt 244.6 lb

## 2020-12-23 DIAGNOSIS — J441 Chronic obstructive pulmonary disease with (acute) exacerbation: Secondary | ICD-10-CM

## 2020-12-23 MED ORDER — BENZONATATE 100 MG PO CAPS: 100.0000 mg | ORAL_CAPSULE | Freq: Three times a day (TID) | ORAL | 0 refills | Status: DC | PRN

## 2020-12-23 MED ORDER — CEFDINIR 300 MG PO CAPS: 300.0000 mg | ORAL_CAPSULE | Freq: Two times a day (BID) | ORAL | 0 refills | Status: DC

## 2020-12-23 NOTE — Progress Notes (Signed)
Subjective: CC: Cough PCP: Dettinger, Fransisca Kaufmann, MD GYF:VCBSW Courtney Mcconnell is a 65 y.o. female presenting to clinic today for:  1.  Cough Patient is accompanied today's visit by her son.  She notes that she is had a cough that has been productive with yellow phlegm for the last 4 days.  She reports associated congestion.  Denies any fevers, chills, myalgia.  She utilizes her inhalers as directed.  She unfortunately has had intolerance to doxycycline and prednisone in the past but she has been able to tolerate cephalosporins without difficulty.  No known sick contacts.  She is not been anywhere and the only 2 people that come into her home are her son and her grandchild.   ROS: Per HPI  Allergies  Allergen Reactions   Penicillins Hives   Bee Venom Swelling   Prednisone Other (See Comments)    Patient did not like how it made her feel, caused jittery feeling.    Amoxicillin-Pot Clavulanate Rash   Doxycycline Hyclate Rash    Rash and itching   Past Medical History:  Diagnosis Date   Asthma    Bursitis    Hip   CAD (coronary artery disease) 02/11/2019   PCI/DES x1 to mLAD, focal ostial 70% lesion in 1st diag   COPD (chronic obstructive pulmonary disease) (HCC)    Essential hypertension    GERD (gastroesophageal reflux disease)    Headache(784.0)    Hyperlipidemia    NSTEMI (non-ST elevated myocardial infarction) ([place]) 02/11/2019   Pneumonia    Seizure ([place])    Sleep apnea     Current Outpatient Medications:    Acetaminophen (TYLENOL 8 HOUR ARTHRITIS PAIN PO), Take by mouth as needed., Disp: , Rfl:    ADVAIR DISKUS 100-50 MCG/DOSE AEPB, inhale ONE PUFF into THE lungs IN THE MORNING AND IN THE EVENING (Patient taking differently: Inhale 1 puff into the lungs 2 (two) times daily.), Disp: 60 each, Rfl: 2   albuterol (VENTOLIN HFA) 108 (90 Base) MCG/ACT inhaler, INHALE TWO PUFFS INTO THE LUNGS EVERY 6 HOURS AS NEEDED FOR WHEEZING OR SHORTNESS OF BREATH, Disp: 18 g, Rfl: 0   aspirin  EC 81 MG EC tablet, Take 1 tablet (81 mg total) by mouth daily., Disp: 90 tablet, Rfl: 1   atorvastatin (LIPITOR) 40 MG tablet, TAKE 1 TABLET BY MOUTH DAILY AT 6PM, Disp: 90 tablet, Rfl: 3   Biotin w/ Vitamins C & E (HAIR/SKIN/NAILS PO), Take by mouth., Disp: , Rfl:    carvedilol (COREG) 3.125 MG tablet, TAKE 1 TABLET BY MOUTH TWICE DAILY WITH A MEAL, Disp: 60 tablet, Rfl: 11   diclofenac Sodium (VOLTAREN) 1 % GEL, Apply 2 g topically 4 (four) times daily., Disp: 967 g, Rfl: 3   folic acid (FOLVITE) 1 MG tablet, Take 1 tablet (1 mg total) by mouth daily., Disp: 90 tablet, Rfl: 2   Lacosamide (VIMPAT) 150 MG TABS, Take 2 tablets by mouth 2 (two) times daily., Disp: , Rfl:    losartan (COZAAR) 25 MG tablet, TAKE 1 TABLET BY MOUTH EVERY DAY, Disp: 60 tablet, Rfl: 5   meloxicam (MOBIC) 15 MG tablet, Take 1 tablet (15 mg total) by mouth daily., Disp: 30 tablet, Rfl: 0   nitroGLYCERIN (NITROSTAT) 0.4 MG SL tablet, Place 1 tablet (0.4 mg total) under the tongue every 5 (five) minutes as needed for chest pain., Disp: 30 tablet, Rfl: 5   omeprazole (PRILOSEC OTC) 20 MG tablet, Take 20 mg by mouth 2 (two) times daily., Disp: ,  Rfl:    SPIRIVA HANDIHALER 18 MCG inhalation capsule, INHALE 1 PUFF BY MOUTH DAILY, Disp: 90 capsule, Rfl: 0   zonisamide (ZONEGRAN) 100 MG capsule, Take 2 capsules by mouth at bedtime., Disp: , Rfl:  Social History   Socioeconomic History   Marital status: Married    Spouse name: [name]   Number of children: 2   Years of education: GED   Highest education level: Not on file  Occupational History   Occupation: COOK    Employer: [employer]  Tobacco Use   Smoking status: Former    Packs/day: 0.25    Years: 42.00    Pack years: 10.50    Types: Cigarettes    Quit date: 03/20/2014    Years since quitting: 6.7   Smokeless tobacco: Never   Tobacco comments:    01/21/15 restarted smoking 4 mos ago  Vaping Use   Vaping Use: Never used  Substance and Sexual Activity    Alcohol use: No    Alcohol/week: 0.0 standard drinks   Drug use: No   Sexual activity: Not on file  Other Topics Concern   Not on file  Social History Narrative   Patient lives at home with family.   Caffeine Use: 1 pot and a half a day   Social Determinants of Health   Financial Resource Strain: Not on file  Food Insecurity: Not on file  Transportation Needs: Not on file  Physical Activity: Not on file  Stress: Not on file  Social Connections: Not on file  Intimate Partner Violence: Not on file   Family History  Problem Relation Age of Onset   COPD Mother    Diabetes Father    Hypertension Father    Hyperlipidemia Father    Cancer Sister        Bone Cancer   Diabetes Brother    Cancer Brother    Cancer Paternal Uncle    Stroke Paternal Grandmother    Healthy Daughter    Healthy Son    Colon cancer Neg Hx     Objective: Office vital signs reviewed. BP (!) 142/77   Pulse 66   Temp (!) 97.2 F (36.2 C)   Ht 5\' 5"  (1.651 m)   Wt 244 lb 9.6 oz (110.9 kg)   SpO2 95%   BMI 40.70 kg/m   Physical Examination:  General: Awake, alert, nontoxic female, No acute distress HEENT: Normal     Ears: Tympanic membranes intact, normal light reflex, no erythema, no bulging    Eyes: PERRLA, extraocular membranes intact, sclera white    Nose: nasal turbinates moist, clear nasal discharge    Throat: moist mucus membranes, no erythema, no tonsillar exudate.  Airway is patent Cardio: regular rate and rhythm, S1S2 heard, no murmurs appreciated Pulm: Coarse breath sounds appreciated but no wheezes.  She has normal work of breathing on room air. MSK: Arrives in wheelchair  Assessment/ Plan: 65 y.o. female   COPD exacerbation ([place]) - Plan: cefdinir (OMNICEF) 300 MG capsule, benzonatate (TESSALON PERLES) 100 MG capsule Empiric treatment for presumed COPD exacerbation.  Unfortunately she cannot tolerate oral prednisone so this was not prescribed.  She will continue her inhaled  corticosteroid.  Oral antibiotics and Tessalon Perles placed.  We discussed red flag signs and symptoms warranting further evaluation.  She voiced good understanding of follow-up as needed  No orders of the defined types were placed in this encounter.  No orders of the defined types were placed in this encounter.  Janora Norlander, DO [facility] [phone]

## 2020-12-23 NOTE — Patient Instructions (Signed)

## 2020-12-29 ENCOUNTER — Other Ambulatory Visit: Payer: Self-pay

## 2020-12-29 ENCOUNTER — Encounter: Payer: Self-pay | Admitting: Surgery

## 2020-12-29 ENCOUNTER — Telehealth: Payer: Self-pay | Admitting: *Deleted

## 2020-12-29 ENCOUNTER — Ambulatory Visit (INDEPENDENT_AMBULATORY_CARE_PROVIDER_SITE_OTHER): Payer: Medicare Other | Admitting: Surgery

## 2020-12-29 VITALS — Ht 66.0 in | Wt 245.4 lb

## 2020-12-29 DIAGNOSIS — M1711 Unilateral primary osteoarthritis, right knee: Secondary | ICD-10-CM

## 2020-12-29 NOTE — Care Plan (Signed)
RNCM met with patient and her daughter during her H&P appointment in office today. She is an Ortho bundle patient for her upcoming Right total knee arthroplasty on 01/09/21. She will be staying with her daughter after surgery and she will provide assistance at discharge. She will need a RW and 3in1/BSC. These will be ordered and given to her prior to discharge at the hospital. Anticipate HHPT will be needed. Referral made to [facility] after choice provided. Reviewed all post op care instructions. Will continue to follow for needs.

## 2020-12-29 NOTE — Progress Notes (Signed)
65 year old white female history of end-stage DJD right knee and pain comes in for preop evaluation.  States that knee symptoms unchanged from previous visit and she is wanting to proceed with right total knee replacement as scheduled.  Today preop history and physical performed.  Review of systems negative.  We have received preop cardiac clearance.  Patient was treated for COPD exacerbation by her primary care provider November 11.  States that she has recovered from this and is currently asymptomatic.  Plan We will proceed with right total knee replacement as scheduled.  Patient states that all questions were answered previously by Dr. Lorin Mercy regarding the procedure.

## 2020-12-29 NOTE — Telephone Encounter (Signed)
Ortho bundle Pre-op call completed. 

## 2020-12-30 ENCOUNTER — Other Ambulatory Visit: Payer: Self-pay

## 2020-12-30 NOTE — H&P (Signed)
TOTAL KNEE ADMISSION H&P  Patient is being admitted for right total knee arthroplasty.  Subjective:  Chief Complaint:right knee pain.  HPI: Courtney Mcconnell, 65 y.o. female, has a history of pain and functional disability in the right knee due to arthritis and has failed non-surgical conservative treatments for greater than 12 weeks to includeNSAID's and/or analgesics, corticosteriod injections, use of assistive devices, weight reduction as appropriate, and activity modification.  Onset of symptoms was gradual, starting 10 years ago with gradually worsening course since that time. The patient noted no past surgery on the right knee(s).  Patient currently rates pain in the right knee(s) at 10 out of 10 with activity. Patient has night pain, worsening of pain with activity and weight bearing, pain that interferes with activities of daily living, pain with passive range of motion, crepitus, and joint swelling.  Patient has evidence of subchondral sclerosis, periarticular osteophytes, and joint space narrowing by imaging studies. There is no active infection.  Patient Active Problem List   Diagnosis Date Noted   Rheumatoid arthritis with rheumatoid factor of multiple sites without organ or systems involvement ([place]) 12/02/2020   Unilateral primary osteoarthritis, right knee 10/05/2020   Coronary artery disease involving native coronary artery of native heart with angina pectoris ([place]) 05/22/2019   Hyperlipidemia with target LDL less than 70 -> CAD 02/12/2019   Presence of drug coated stent in LAD coronary artery 02/11/2019   History of non-ST elevation myocardial infarction (NSTEMI) 02/10/2019   Cerebral infarction, remote, resolved 06/27/2018   Pulmonary nodules 12/08/2015   Sleep apnea 04/06/2015   Pulmonary emphysema ([place]) 03/02/2015   Smoker 02/16/2015   Seizure disorder ([place]) 01/21/2015   Focal epilepsy with impairment of consciousness ([place]) 12/13/2014   Essential hypertension 04/18/2014    Preretinal fibrosis, 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   Tobacco use disorder 07/19/2012   Past Medical History:  Diagnosis Date   Asthma    Bursitis    Hip   CAD (coronary artery disease) 02/11/2019   PCI/DES x1 to mLAD, focal ostial 70% lesion in 1st diag   COPD (chronic obstructive pulmonary disease) ([place])    Essential hypertension    GERD (gastroesophageal reflux disease)    Headache(784.0)    Hyperlipidemia    NSTEMI (non-ST elevated myocardial infarction) ([place]) 02/11/2019   Pneumonia    Seizure ([place])    Sleep apnea     Past Surgical History:  Procedure Laterality Date   ABDOMINAL HYSTERECTOMY     APPENDECTOMY     CATARACT EXTRACTION Bilateral    CHOLECYSTECTOMY     CORONARY STENT INTERVENTION N/A 02/11/2019   Procedure: CORONARY STENT INTERVENTION;  Surgeon: Burnell Blanks, MD;  Location: [facility] IN[facility]ASIVE [facility];; mid LAD 70% -- DES PCI (Synergy DES 2.5 x 28 --2.8 mm)   heal spur     right   INTRAVASCULAR PRESSURE WIRE/FFR STUDY N/A 02/11/2019   Procedure: INTRAVASCULAR PRESSURE WIRE/FFR STUDY;  Surgeon: Burnell Blanks, MD;  Location: [place];  Service: Cardiovascular;  Laterality: N/A;   LASER PHOTO ABLATION Right 08/27/2013   Procedure: LASER PHOTO ABLATION;  Surgeon: Hayden Pedro, MD;  Location: [facility] [place];  Service: Ophthalmology;  Laterality: Right;  Headscope laser AND Endolaser   LEFT HEART CATH AND CORONARY ANGIOGRAPHY N/A 02/11/2019   Procedure: LEFT HEART CATH AND CORONARY ANGIOGRAPHY;  Surgeon: Burnell Blanks, MD;  Location: [facility] INVASIVE CV LAB;;; prox RCA 20%. Ost-prox Cx 20%. ostial D1 70% & mLAD 70% => (DES PCI of LAD)  MEMBRANE PEEL Right 08/27/2013   Procedure: MEMBRANE PEEL;  Surgeon: Hayden Pedro, MD;  Location: [place];  Service: Ophthalmology;  Laterality: Right;   PARS PLANA VITRECTOMY Right 08/27/2013   Procedure: PARS PLANA VITRECTOMY WITH 25 GAUGE;  Surgeon: Hayden Pedro, MD;  Location: [facility];  Service: Ophthalmology;   Laterality: Right;    No current facility-administered medications for this encounter.   Current Outpatient Medications  Medication Sig Dispense Refill Last Dose   acetaminophen (TYLENOL) 650 MG CR tablet Take by mouth as needed.      ADVAIR DISKUS 100-50 MCG/DOSE AEPB inhale ONE PUFF into THE lungs IN THE MORNING AND IN THE EVENING (Patient taking differently: Inhale 1 puff into the lungs 2 (two) times daily.) 60 each 2    albuterol (VENTOLIN HFA) 108 (90 Base) MCG/ACT inhaler INHALE TWO PUFFS INTO THE LUNGS EVERY 6 HOURS AS NEEDED FOR WHEEZING OR SHORTNESS OF BREATH 18 g 0    aspirin EC 81 MG EC tablet Take 1 tablet (81 mg total) by mouth daily. 90 tablet 1    atorvastatin (LIPITOR) 40 MG tablet TAKE 1 TABLET BY MOUTH DAILY AT 6PM 90 tablet 3    carvedilol (COREG) 3.125 MG tablet TAKE 1 TABLET BY MOUTH TWICE DAILY WITH A MEAL 60 tablet 11    diclofenac Sodium (VOLTAREN) 1 % GEL Apply 2 g topically 4 (four) times daily. (Patient taking differently: Apply 2 g topically 2 (two) times daily as needed (knee pain).) 174 g 3    folic acid (FOLVITE) 1 MG tablet Take 1 tablet (1 mg total) by mouth daily. 90 tablet 2    Lacosamide (VIMPAT) 150 MG TABS Take 300 mg by mouth 2 (two) times daily.      losartan (COZAAR) 25 MG tablet TAKE 1 TABLET BY MOUTH EVERY DAY 60 tablet 5    meloxicam (MOBIC) 15 MG tablet Take 1 tablet (15 mg total) by mouth daily. (Patient taking differently: Take 15 mg by mouth daily as needed for pain.) 30 tablet 0    nitroGLYCERIN (NITROSTAT) 0.4 MG SL tablet Place 1 tablet (0.4 mg total) under the tongue every 5 (five) minutes as needed for chest pain. 30 tablet 5    omeprazole (PRILOSEC OTC) 20 MG tablet Take 20 mg by mouth 2 (two) times daily.      SPIRIVA HANDIHALER 18 MCG inhalation capsule INHALE 1 PUFF BY MOUTH DAILY 90 capsule 0    zonisamide (ZONEGRAN) 100 MG capsule Take 200 mg by mouth at bedtime.      benzonatate (TESSALON PERLES) 100 MG capsule Take 1 capsule (100 mg  total) by mouth 3 (three) times daily as needed. (Patient not taking: Reported on 12/29/2020) 20 capsule 0 Not Taking   cefdinir (OMNICEF) 300 MG capsule Take 1 capsule (300 mg total) by mouth 2 (two) times daily. 1 po BID (Patient not taking: Reported on 12/29/2020) 20 capsule 0 Not Taking   Allergies  Allergen Reactions   Penicillins Hives   Bee Venom Swelling   Prednisone Other (See Comments)    Patient did not like how it made her feel, caused jittery feeling.    Amoxicillin-Pot Clavulanate Rash   Doxycycline Hyclate Itching and Rash    Social History   Tobacco Use   Smoking status: Former    Packs/day: 0.25    Years: 42.00    Pack years: 10.50    Types: Cigarettes    Quit date: 03/20/2014    Years since quitting: 6.7  Smokeless tobacco: Never   Tobacco comments:    01/21/15 restarted smoking 4 mos ago  Substance Use Topics   Alcohol use: No    Alcohol/week: 0.0 standard drinks    Family History  Problem Relation Age of Onset   COPD Mother    Diabetes Father    Hypertension Father    Hyperlipidemia Father    Cancer Sister        Bone Cancer   Diabetes Brother    Cancer Brother    Cancer Paternal Uncle    Stroke Paternal Grandmother    Healthy Daughter    Healthy Son    Colon cancer Neg Hx      Review of Systems  Constitutional:  Positive for activity change.  HENT: Negative.    Respiratory: Negative.    Cardiovascular: Negative.   Gastrointestinal: Negative.   Genitourinary: Negative.   Musculoskeletal:  Positive for gait problem and joint swelling.  Psychiatric/Behavioral: Negative.     Objective:  Physical Exam HENT:     Head: Normocephalic and atraumatic.     Nose: Nose normal.  Eyes:     Extraocular Movements: Extraocular movements intact.  Cardiovascular:     Rate and Rhythm: Regular rhythm.     Heart sounds: Normal heart sounds.  Pulmonary:     Effort: Pulmonary effort is normal. No respiratory distress.     Breath sounds: Normal breath  sounds.  Musculoskeletal:        General: Tenderness present.  Neurological:     Mental Status: She is alert and oriented to person, place, and time.  Psychiatric:        Mood and Affect: Mood normal.    Vital signs in last 24 hours:    Labs:   Estimated body mass index is 39.61 kg/m as calculated from the following:   Height as of 12/29/20: 5\' 6"  (1.676 m).   Weight as of 12/29/20: 111.3 kg.   Imaging Review Plain radiographs demonstrate moderate degenerative joint disease of the right knee(s). The overall alignment ismild varus. The bone quality appears to be good for age and reported activity level.      Assessment/Plan:  End stage arthritis, right knee   The patient history, physical examination, clinical judgment of the provider and imaging studies are consistent with end stage degenerative joint disease of the right knee(s) and total knee arthroplasty is deemed medically necessary. The treatment options including medical management, injection therapy arthroscopy and arthroplasty were discussed at length. The risks and benefits of total knee arthroplasty were presented and reviewed. The risks due to aseptic loosening, infection, stiffness, patella tracking problems, thromboembolic complications and other imponderables were discussed. The patient acknowledged the explanation, agreed to proceed with the plan and consent was signed. Patient is being admitted for inpatient treatment for surgery, pain control, PT, OT, prophylactic antibiotics, VTE prophylaxis, progressive ambulation and ADL's and discharge planning. The patient is planning to be discharged home with home health services     Patient's anticipated LOS is less than 2 midnights, meeting these requirements: - Younger than 29 - Lives within 1 hour of care - Has a competent adult at home to recover with post-op recover - NO history of  - Chronic pain requiring opiods  - Diabetes  - Coronary Artery Disease  -  Heart failure  - Heart attack  - Stroke  - DVT/VTE  - Cardiac arrhythmia  - Respiratory Failure/COPD  - Renal failure  - Anemia  - Advanced Liver disease

## 2021-01-02 NOTE — Pre-Procedure Instructions (Signed)
Surgical Instructions    Your procedure is scheduled on 01/09/21.  Report to [facility] Main Entrance "A" at 10:30  A.M., then check in with the Admitting office.  Call this number if you have problems the morning of surgery:  [phone]   If you have any questions prior to your surgery date call [phone]: Open Monday-Friday 8am-4pm    Remember:  Do not eat after midnight the night before your surgery  You may drink clear liquids until 09:30 the morning of your surgery.   Clear liquids allowed are: Water, Non-Citrus Juices (without pulp), Carbonated Beverages, Clear Tea, Black Coffee ONLY (NO MILK, CREAM OR POWDERED CREAMER of any kind), and Gatorade  Patient Instructions  The day of surgery (if you do NOT have diabetes):  Drink ONE (1) Pre-Surgery Clear Ensure by 09:30 the morning of surgery. Drink in one sitting. Do not sip.  This drink was given to you during your hospital  pre-op appointment visit.  Nothing else to drink after completing the  Pre-Surgery Clear Ensure.         If you have questions, please contact your surgeon's office.     Take these medicines the morning of surgery with A SIP OF WATER  ADVAIR DISKUS atorvastatin (LIPITOR carvedilol (COREG) Lacosamide (VIMPAT) omeprazole (PRILOSEC OTC) SPIRIVA HANDIHALER  Please bring all inhalers with you the day of surgery.   As of today, STOP taking any Aspirin (unless otherwise instructed by your surgeon) Aleve, Naproxen, Ibuprofen, Motrin, Advil, Goody's, BC's, all herbal medications, fish oil, and all vitamins.   After your COVID test   You are not required to quarantine however you are required to wear a well-fitting mask when you are out and around people not in your household.  If your mask becomes wet or soiled, replace with a new one.  Wash your hands often with soap and water for 20 seconds or clean your hands with an alcohol-based hand sanitizer that contains at least 60% alcohol.  Do not share  personal items.  Notify your provider: if you are in close contact with someone who has COVID  or if you develop a fever of 100.4 or greater, sneezing, cough, sore throat, shortness of breath or body aches.           Do not wear jewelry or makeup Do not wear lotions, powders, perfumes/colognes, or deodorant. Do not shave 48 hours prior to surgery.  Men may shave face and neck. Do not bring valuables to the hospital. DO Not wear nail polish, gel polish, artificial nails, or any other type of covering on natural nails including finger and toenails. If patients have artificial nails, gel coating, etc. that need to be removed by a nail salon, please have this removed prior to surgery or surgery may need to be canceled/delayed if the surgeon/ anesthesia feels like the patient is unable to be adequately monitored.             [facility] is not responsible for any belongings or valuables.  Do NOT Smoke (Tobacco/Vaping)  24 hours prior to your procedure  If you use a CPAP at night, you may bring your mask for your overnight stay.   Contacts, glasses, hearing aids, dentures or partials may not be worn into surgery, please bring cases for these belongings   For patients admitted to the hospital, discharge time will be determined by your treatment team.   Patients discharged the day of surgery will not be allowed to drive home,  and someone needs to stay with them for 24 hours.  NO VISITORS WILL BE ALLOWED IN PRE-OP WHERE PATIENTS ARE PREPPED FOR SURGERY.  ONLY 1 SUPPORT PERSON MAY BE PRESENT IN THE WAITING ROOM WHILE YOU ARE IN SURGERY.  IF YOU ARE TO BE ADMITTED, ONCE YOU ARE IN YOUR ROOM YOU WILL BE ALLOWED TWO (2) VISITORS. 1 (ONE) VISITOR MAY STAY OVERNIGHT BUT MUST ARRIVE TO THE ROOM BY 8pm.  Minor children may have two parents present. Special consideration for safety and communication needs will be reviewed on a case by case basis.  Special instructions:    Oral Hygiene is also important  to reduce your risk of infection.  Remember - BRUSH YOUR TEETH THE MORNING OF SURGERY WITH YOUR REGULAR TOOTHPASTE   [facility]- Preparing For Surgery  Before surgery, you can play an important role. Because skin is not sterile, your skin needs to be as free of germs as possible. You can reduce the number of germs on your skin by washing with CHG (chlorahexidine gluconate) Soap before surgery.  CHG is an antiseptic cleaner which kills germs and bonds with the skin to continue killing germs even after washing.     Please do not use if you have an allergy to CHG or antibacterial soaps. If your skin becomes reddened/irritated stop using the CHG.  Do not shave (including legs and underarms) for at least 48 hours prior to first CHG shower. It is OK to shave your face.  Please follow these instructions carefully.     Shower the NIGHT BEFORE SURGERY and the MORNING OF SURGERY with CHG Soap.   If you chose to wash your hair, wash your hair first as usual with your normal shampoo. After you shampoo, rinse your hair and body thoroughly to remove the shampoo.  Then [employer] and genitals (private parts) with your normal soap and rinse thoroughly to remove soap.  After that Use CHG Soap as you would any other liquid soap. You can apply CHG directly to the skin and wash gently with a scrungie or a clean washcloth.   Apply the CHG Soap to your body ONLY FROM THE NECK DOWN.  Do not use on open wounds or open sores. Avoid contact with your eyes, ears, mouth and genitals (private parts). Wash Face and genitals (private parts)  with your normal soap.   Wash thoroughly, paying special attention to the area where your surgery will be performed.  Thoroughly rinse your body with warm water from the neck down.  DO NOT shower/wash with your normal soap after using and rinsing off the CHG Soap.  Pat yourself dry with a CLEAN TOWEL.  Wear CLEAN PAJAMAS to bed the night before surgery  Place CLEAN SHEETS on  your bed the night before your surgery  DO NOT SLEEP WITH PETS.   Day of Surgery:  Take a shower with CHG soap. Wear Clean/Comfortable clothing the morning of surgery Do not apply any deodorants/lotions.   Remember to brush your teeth WITH YOUR REGULAR TOOTHPASTE.   Please read over the following fact sheets that you were given.

## 2021-01-03 ENCOUNTER — Encounter (HOSPITAL_COMMUNITY)
Admission: RE | Admit: 2021-01-03 | Discharge: 2021-01-03 | Disposition: A | Discharge status: Home / Self Care | Payer: Medicare Other | Source: Ambulatory Visit | Attending: Orthopaedic Surgery | Admitting: Orthopaedic Surgery

## 2021-01-03 ENCOUNTER — Ambulatory Visit (HOSPITAL_COMMUNITY)
Admission: RE | Admit: 2021-01-03 | Discharge: 2021-01-03 | Disposition: A | Discharge status: Home / Self Care | Payer: Medicare Other | Source: Ambulatory Visit | Attending: Orthopaedic Surgery | Admitting: Orthopaedic Surgery

## 2021-01-03 ENCOUNTER — Encounter (HOSPITAL_COMMUNITY): Payer: Self-pay

## 2021-01-03 ENCOUNTER — Other Ambulatory Visit: Payer: Self-pay

## 2021-01-03 DIAGNOSIS — Z01818 Encounter for other preprocedural examination: Secondary | ICD-10-CM | POA: Insufficient documentation

## 2021-01-03 LAB — CBC
HCT: 39.5 % (ref 36.0–46.0)
Hemoglobin: 12.6 g/dL (ref 12.0–15.0)
MCH: 32.6 pg (ref 26.0–34.0)
MCHC: 31.9 g/dL (ref 30.0–36.0)
MCV: 102.1 fL — ABNORMAL HIGH (ref 80.0–100.0)
Platelets: 234 10*3/uL (ref 150–400)
RBC: 3.87 MIL/uL (ref 3.87–5.11)
RDW: 12.9 % (ref 11.5–15.5)
WBC: 7.9 10*3/uL (ref 4.0–10.5)
nRBC: 0 % (ref 0.0–0.2)

## 2021-01-03 LAB — COMPREHENSIVE METABOLIC PANEL
ALT: 9 U/L (ref 0–44)
AST: 16 U/L (ref 15–41)
Albumin: 3.5 g/dL (ref 3.5–5.0)
Alkaline Phosphatase: 82 U/L (ref 38–126)
Anion gap: 8 (ref 5–15)
BUN: 9 mg/dL (ref 8–23)
CO2: 26 mmol/L (ref 22–32)
Calcium: 8.8 mg/dL — ABNORMAL LOW (ref 8.9–10.3)
Chloride: 102 mmol/L (ref 98–111)
Creatinine, Ser: 0.94 mg/dL (ref 0.44–1.00)
GFR, Estimated: >60 mL/min (ref >60)
Glucose, Bld: 166 mg/dL — ABNORMAL HIGH (ref 70–99)
Potassium: 3.5 mmol/L (ref 3.5–5.1)
Sodium: 136 mmol/L (ref 135–145)
Total Bilirubin: 0.4 mg/dL (ref 0.3–1.2)
Total Protein: 6.4 g/dL — ABNORMAL LOW (ref 6.5–8.1)

## 2021-01-03 LAB — SURGICAL PCR SCREEN
MRSA, PCR: NEGATIVE
Staphylococcus aureus: NEGATIVE

## 2021-01-03 MED ORDER — BUPIVACAINE LIPOSOME 1.3 % IJ SUSP: 20.0000 mL | Freq: Once | INTRAMUSCULAR | Status: DC

## 2021-01-03 NOTE — Progress Notes (Signed)
Anesthesia Chart Review:  Follows with cardiology for history of CAD s/p DES to LAD in December 2020.  Last seen by Dr. Domenic Polite 12/22/2020 for preop clearance.  Per note, "Preoperative cardiac evaluation in a 65 year old woman with a history of CAD, hypertension, and hyperlipidemia.  She is being considered for right total knee arthroplasty with Dr. Lorin Mercy.  RCRI cardiac risk calculator indicates class II, 0.9% chance of major adverse cardiac event in the perioperative setting.  She underwent ischemic testing in August of this year which was low risk.  She should be able to proceed with planned surgery at relatively low risk without further cardiac testing."  Former smoker. COPD maintained on Spiriva and Advair.  Preop labs reviewed, unremarkable. No diagnosis of DM2, however A1c 6.5 on 10/27/20.  EKG 01/03/21: Normal sinus rhythm. Rate 68. Nonspecific T wave abnormality  Lexiscan Myoview 10/12/2020:   Findings are consistent with no significant ischemia. The study is low risk.   No ST deviation was noted.   Defect 1: There is a small defect with mild reduction in uptake present in the apical anteroseptal location(s) that is fixed. There is normal wall motion in the defect area. Consistent with artifact caused by breast attenuation.   Left ventricular function is normal. End diastolic cavity size is normal. End systolic cavity size is normal.   Low risk study with LVEF 66%.  Breast attenuation artifact noted without significant ischemic territories.  Cath and PCI 02/11/2019: 1. Severe mid LAD stenosis.  2. Successful PTCA/DES x 1 mid LAD 3. Moderately severe stenosis in the ostium of the moderate caliber diagonal branch.  4. Mild non-obstructive disease in the RCA and Circumflex   Recommendations: DAPT with ASA and Brilinta for one year. High intensity statin and beta blocker.    TTE 02/11/2019:  1. Left ventricular ejection fraction, by visual estimation, is 55 to  60%. The left ventricle  has normal function. There is no left ventricular  hypertrophy.   2. Left ventricular diastolic parameters are consistent with Grade I  diastolic dysfunction (impaired relaxation).   3. The left ventricle has no regional wall motion abnormalities.   4. Global right ventricle has normal systolic function.The right  ventricular size is normal. No increase in right ventricular wall  thickness.   5. Left atrial size was normal.   6. Right atrial size was normal.   7. Presence of pericardial fat pad.   8. Trivial pericardial effusion is present.   9. The mitral valve is grossly normal. Trivial mitral valve  regurgitation.  10. The tricuspid valve is grossly normal.  11. The aortic valve is grossly normal. Aortic valve regurgitation is not  visualized. No evidence of aortic valve sclerosis or stenosis.  12. The pulmonic valve was grossly normal. Pulmonic valve regurgitation is  not visualized.  13. There is mild dilatation of the ascending aorta measuring 40 mm.  14. TR signal is inadequate for assessing pulmonary artery systolic  pressure.  15. The inferior vena cava is normal in size with greater than 50%  respiratory variability, suggesting right atrial pressure of 3 mmHg.  16. No prior Echocardiogram.  17. The average left ventricular global longitudinal strain is -16.3 %.     Wynonia Musty [facility] Stay Center/Anesthesiology Phone [phone] 01/04/2021 9:05 AM

## 2021-01-03 NOTE — Progress Notes (Signed)
PCP: Caryl Pina, MD Cardiologist: Rozann Lesches, MD  EKG: 12/14/20 CXR: 01/03/21 ECHO: 02/11/19 Stress Test: 10/12/20 Cardiac Cath: 02/11/19  Fasting Blood Sugar- na Checks Blood Sugar__na_ times a day  ASA: Last dose will be 01/06/21 Blood Thinner: No  OSA/CPAP: No  Covid test scheduled 01/06/21  Anesthesia Review: Yes, cardiac history  Patient denies shortness of breath, fever, cough, and chest pain at PAT appointment.  Patient verbalized understanding of instructions provided today at the PAT appointment.  Patient asked to review instructions at home and day of surgery.

## 2021-01-04 NOTE — Anesthesia Preprocedure Evaluation (Addendum)
Anesthesia Evaluation  Patient identified by MRN, date of birth, ID band Patient awake    Reviewed: Allergy & Precautions, NPO status , Patient's Chart, lab work & pertinent test results, reviewed documented beta blocker date and time   History of Anesthesia Complications Negative for: history of anesthetic complications  Airway Mallampati: III  TM Distance: >3 FB Neck ROM: Full    Dental  (+) Edentulous Lower, Edentulous Upper   Pulmonary asthma , sleep apnea , COPD,  COPD inhaler, former smoker,    Pulmonary exam normal        Cardiovascular hypertension, Pt. on home beta blockers and Pt. on medications + CAD, + Past MI and + Cardiac Stents  Normal cardiovascular exam   '22 Myoperfusion - Findings are consistent with no significant ischemia. The study is low risk. .  No ST deviation was noted. .  Defect 1: There is a small defect with mild reduction in uptake present in the apical anteroseptal location(s) that is fixed. There is normal wall motion in the defect area. Consistent with artifact caused by breast attenuation. .  Left ventricular function is normal. End diastolic cavity size is normal. End systolic cavity size is normal.  '20 Cath - Prox RCA lesion is 20% stenosed. Ost Cx to Prox Cx lesion is 20% stenosed. 1st Diag lesion is 70% stenosed. Mid LAD lesion is 80% stenosed. A drug-eluting stent was successfully placed using a SYNERGY XD 2.50X28. Post intervention, there is a 0% residual stenosis.    Neuro/Psych  Headaches, Seizures -, Well Controlled,  negative psych ROS   GI/Hepatic Neg liver ROS, GERD  Medicated and Controlled,  Endo/Other  Morbid obesity  Renal/GU negative Renal ROS     Musculoskeletal  (+) Arthritis , Rheumatoid disorders,    Abdominal (+) + obese,   Peds  Hematology negative hematology ROS (+)   Anesthesia Other Findings   Reproductive/Obstetrics                            Anesthesia Physical Anesthesia Plan  ASA: 3  Anesthesia Plan: General   Post-op Pain Management: Tylenol PO (pre-op)   Induction: Intravenous  PONV Risk Score and Plan: 3 and Treatment may vary due to age or medical condition, Ondansetron and Dexamethasone  Airway Management Planned: Oral ETT  Additional Equipment: None  Intra-op Plan:   Post-operative Plan: Extubation in OR  Informed Consent: I have reviewed the patients History and Physical, chart, labs and discussed the procedure including the risks, benefits and alternatives for the proposed anesthesia with the patient or authorized representative who has indicated his/her understanding and acceptance.     Dental advisory given  Plan Discussed with: CRNA and Anesthesiologist  Anesthesia Plan Comments: (Discussed spinal with patient, patient refused )      Anesthesia Quick Evaluation

## 2021-01-06 ENCOUNTER — Other Ambulatory Visit (HOSPITAL_COMMUNITY)
Admission: RE | Admit: 2021-01-06 | Discharge: 2021-01-06 | Disposition: A | Discharge status: Home / Self Care | Payer: Medicare Other | Source: Ambulatory Visit | Attending: Orthopaedic Surgery | Admitting: Orthopaedic Surgery

## 2021-01-06 DIAGNOSIS — Z01812 Encounter for preprocedural laboratory examination: Secondary | ICD-10-CM | POA: Diagnosis not present

## 2021-01-06 DIAGNOSIS — Z20822 Contact with and (suspected) exposure to covid-19: Secondary | ICD-10-CM | POA: Insufficient documentation

## 2021-01-06 DIAGNOSIS — Z01818 Encounter for other preprocedural examination: Secondary | ICD-10-CM

## 2021-01-06 LAB — SARS CORONAVIRUS 2 (TAT 6-24 HRS): SARS Coronavirus 2: NEGATIVE

## 2021-01-09 ENCOUNTER — Ambulatory Visit (HOSPITAL_COMMUNITY): Payer: Medicare Other | Admitting: Physician Assistant

## 2021-01-09 ENCOUNTER — Encounter (HOSPITAL_COMMUNITY): Payer: Self-pay | Admitting: Orthopaedic Surgery

## 2021-01-09 ENCOUNTER — Encounter (HOSPITAL_COMMUNITY)
Admission: RE | Disposition: A | Discharge status: Home / Self Care | Payer: Self-pay | Source: Home / Self Care | Attending: Orthopaedic Surgery

## 2021-01-09 ENCOUNTER — Other Ambulatory Visit: Payer: Self-pay

## 2021-01-09 ENCOUNTER — Ambulatory Visit (HOSPITAL_COMMUNITY): Payer: Medicare Other | Admitting: Certified Registered Nurse Anesthetist

## 2021-01-09 ENCOUNTER — Observation Stay (HOSPITAL_COMMUNITY)
Admission: RE | Admit: 2021-01-09 | Discharge: 2021-01-11 | Disposition: A | Discharge status: Home / Self Care | Payer: Medicare Other | Attending: Orthopaedic Surgery | Admitting: Orthopaedic Surgery

## 2021-01-09 DIAGNOSIS — I2510 Atherosclerotic heart disease of native coronary artery without angina pectoris: Secondary | ICD-10-CM | POA: Insufficient documentation

## 2021-01-09 DIAGNOSIS — I25119 Atherosclerotic heart disease of native coronary artery with unspecified angina pectoris: Secondary | ICD-10-CM | POA: Diagnosis not present

## 2021-01-09 DIAGNOSIS — Z7982 Long term (current) use of aspirin: Secondary | ICD-10-CM | POA: Diagnosis not present

## 2021-01-09 DIAGNOSIS — J45909 Unspecified asthma, uncomplicated: Secondary | ICD-10-CM | POA: Insufficient documentation

## 2021-01-09 DIAGNOSIS — M1711 Unilateral primary osteoarthritis, right knee: Secondary | ICD-10-CM | POA: Diagnosis not present

## 2021-01-09 DIAGNOSIS — I10 Essential (primary) hypertension: Secondary | ICD-10-CM | POA: Diagnosis not present

## 2021-01-09 DIAGNOSIS — Z87891 Personal history of nicotine dependence: Secondary | ICD-10-CM | POA: Diagnosis not present

## 2021-01-09 DIAGNOSIS — G8918 Other acute postprocedural pain: Secondary | ICD-10-CM | POA: Diagnosis not present

## 2021-01-09 DIAGNOSIS — Z79899 Other long term (current) drug therapy: Secondary | ICD-10-CM | POA: Insufficient documentation

## 2021-01-09 DIAGNOSIS — J449 Chronic obstructive pulmonary disease, unspecified: Secondary | ICD-10-CM | POA: Diagnosis not present

## 2021-01-09 DIAGNOSIS — Z01818 Encounter for other preprocedural examination: Secondary | ICD-10-CM

## 2021-01-09 DIAGNOSIS — Z96651 Presence of right artificial knee joint: Secondary | ICD-10-CM

## 2021-01-09 HISTORY — PX: TOTAL KNEE ARTHROPLASTY: SHX125

## 2021-01-09 SURGERY — ARTHROPLASTY, KNEE, TOTAL
Anesthesia: Spinal | Site: Knee | Laterality: Right

## 2021-01-09 MED ORDER — ACETAMINOPHEN 500 MG PO TABS
500.0000 mg | ORAL_TABLET | Freq: Four times a day (QID) | ORAL | Status: AC
  Administered 2021-01-09 – 2021-01-10 (×2): 500 mg via ORAL
  Filled 2021-01-09 (×4): qty 1

## 2021-01-09 MED ORDER — ZONISAMIDE 100 MG PO CAPS
200.0000 mg | ORAL_CAPSULE | Freq: Every day | ORAL | Status: DC
  Administered 2021-01-09 – 2021-01-10 (×2): 200 mg via ORAL
  Filled 2021-01-09 (×3): qty 2

## 2021-01-09 MED ORDER — OMEPRAZOLE MAGNESIUM 20 MG PO TBEC: 20.0000 mg | DELAYED_RELEASE_TABLET | Freq: Two times a day (BID) | ORAL | Status: DC

## 2021-01-09 MED ORDER — TRANEXAMIC ACID-NACL 1000-0.7 MG/100ML-% IV SOLN
INTRAVENOUS | Status: DC | PRN
  Administered 2021-01-09: 1000 mg via INTRAVENOUS

## 2021-01-09 MED ORDER — VANCOMYCIN HCL IN DEXTROSE 1-5 GM/200ML-% IV SOLN
INTRAVENOUS | Status: AC
  Administered 2021-01-09: 11:00:00 1000 mg via INTRAVENOUS
  Filled 2021-01-09: qty 200

## 2021-01-09 MED ORDER — HYDROCODONE-ACETAMINOPHEN 7.5-325 MG PO TABS
1.0000 | ORAL_TABLET | ORAL | Status: DC | PRN
  Administered 2021-01-10 – 2021-01-11 (×4): 2 via ORAL
  Filled 2021-01-09 (×4): qty 2

## 2021-01-09 MED ORDER — ORAL CARE MOUTH RINSE: 15.0000 mL | Freq: Once | OROMUCOSAL | Status: AC

## 2021-01-09 MED ORDER — ROPIVACAINE HCL 7.5 MG/ML IJ SOLN
INTRAMUSCULAR | Status: DC | PRN
  Administered 2021-01-09: 20 mL

## 2021-01-09 MED ORDER — FENTANYL CITRATE (PF) 100 MCG/2ML IJ SOLN
INTRAMUSCULAR | Status: AC
  Administered 2021-01-09: 15:00:00 50 ug via INTRAVENOUS
  Filled 2021-01-09: qty 2

## 2021-01-09 MED ORDER — HYDROMORPHONE HCL 1 MG/ML IJ SOLN
INTRAMUSCULAR | Status: DC | PRN
  Administered 2021-01-09: .5 mg via INTRAVENOUS

## 2021-01-09 MED ORDER — BUPIVACAINE LIPOSOME 1.3 % IJ SUSP
INTRAMUSCULAR | Status: DC | PRN
  Administered 2021-01-09: 20 mL

## 2021-01-09 MED ORDER — LACTATED RINGERS IV SOLN: INTRAVENOUS | Status: DC

## 2021-01-09 MED ORDER — BUPIVACAINE HCL (PF) 0.25 % IJ SOLN
INTRAMUSCULAR | Status: DC | PRN
  Administered 2021-01-09: 20 mL

## 2021-01-09 MED ORDER — ACETAMINOPHEN 500 MG PO TABS: 1000.0000 mg | ORAL_TABLET | Freq: Once | ORAL | Status: AC

## 2021-01-09 MED ORDER — TRANEXAMIC ACID-NACL 1000-0.7 MG/100ML-% IV SOLN
INTRAVENOUS | Status: AC
  Filled 2021-01-09: qty 100

## 2021-01-09 MED ORDER — ROCURONIUM BROMIDE 10 MG/ML (PF) SYRINGE
PREFILLED_SYRINGE | INTRAVENOUS | Status: DC | PRN
  Administered 2021-01-09: 60 mg via INTRAVENOUS

## 2021-01-09 MED ORDER — CARVEDILOL 3.125 MG PO TABS
3.1250 mg | ORAL_TABLET | Freq: Two times a day (BID) | ORAL | Status: DC
  Administered 2021-01-09 – 2021-01-11 (×4): 3.125 mg via ORAL
  Filled 2021-01-09 (×4): qty 1

## 2021-01-09 MED ORDER — MOMETASONE FURO-FORMOTEROL FUM 100-5 MCG/ACT IN AERO
2.0000 | INHALATION_SPRAY | Freq: Two times a day (BID) | RESPIRATORY_TRACT | Status: DC
  Administered 2021-01-09 – 2021-01-11 (×3): 2 via RESPIRATORY_TRACT
  Filled 2021-01-09: qty 8.8

## 2021-01-09 MED ORDER — VANCOMYCIN HCL IN DEXTROSE 1-5 GM/200ML-% IV SOLN: 1000.0000 mg | INTRAVENOUS | Status: AC

## 2021-01-09 MED ORDER — NITROGLYCERIN 0.4 MG SL SUBL: 0.4000 mg | SUBLINGUAL_TABLET | SUBLINGUAL | Status: DC | PRN

## 2021-01-09 MED ORDER — PROPOFOL 10 MG/ML IV BOLUS
INTRAVENOUS | Status: AC
  Filled 2021-01-09: qty 20

## 2021-01-09 MED ORDER — DOCUSATE SODIUM 100 MG PO CAPS
100.0000 mg | ORAL_CAPSULE | Freq: Two times a day (BID) | ORAL | Status: DC
  Administered 2021-01-09 – 2021-01-11 (×4): 100 mg via ORAL
  Filled 2021-01-09 (×4): qty 1

## 2021-01-09 MED ORDER — TIOTROPIUM BROMIDE MONOHYDRATE 18 MCG IN CAPS
18.0000 ug | ORAL_CAPSULE | Freq: Every day | RESPIRATORY_TRACT | Status: DC
Start: 2021-01-09 — End: 2021-01-09

## 2021-01-09 MED ORDER — MENTHOL 3 MG MT LOZG: 1.0000 | LOZENGE | OROMUCOSAL | Status: DC | PRN

## 2021-01-09 MED ORDER — ACETAMINOPHEN 500 MG PO TABS
ORAL_TABLET | ORAL | Status: AC
  Administered 2021-01-09: 11:00:00 1000 mg via ORAL
  Filled 2021-01-09: qty 2

## 2021-01-09 MED ORDER — FENTANYL CITRATE (PF) 250 MCG/5ML IJ SOLN
INTRAMUSCULAR | Status: DC | PRN
  Administered 2021-01-09 (×3): 50 ug via INTRAVENOUS
  Administered 2021-01-09: 100 ug via INTRAVENOUS

## 2021-01-09 MED ORDER — OXYCODONE HCL 5 MG PO TABS: 5.0000 mg | ORAL_TABLET | Freq: Once | ORAL | Status: DC | PRN

## 2021-01-09 MED ORDER — METOCLOPRAMIDE HCL 5 MG/ML IJ SOLN: 5.0000 mg | Freq: Three times a day (TID) | INTRAMUSCULAR | Status: DC | PRN

## 2021-01-09 MED ORDER — SUGAMMADEX SODIUM 200 MG/2ML IV SOLN
INTRAVENOUS | Status: DC | PRN
  Administered 2021-01-09: 200 mg via INTRAVENOUS

## 2021-01-09 MED ORDER — UMECLIDINIUM BROMIDE 62.5 MCG/ACT IN AEPB
1.0000 | INHALATION_SPRAY | Freq: Every day | RESPIRATORY_TRACT | Status: DC
  Administered 2021-01-10 – 2021-01-11 (×2): 1 via RESPIRATORY_TRACT
  Filled 2021-01-09 (×2): qty 7

## 2021-01-09 MED ORDER — OXYCODONE HCL 5 MG/5ML PO SOLN: 5.0000 mg | Freq: Once | ORAL | Status: DC | PRN

## 2021-01-09 MED ORDER — ASPIRIN EC 325 MG PO TBEC
325.0000 mg | DELAYED_RELEASE_TABLET | Freq: Every day | ORAL | Status: DC
  Administered 2021-01-10 – 2021-01-11 (×2): 325 mg via ORAL
  Filled 2021-01-09 (×2): qty 1

## 2021-01-09 MED ORDER — SODIUM CHLORIDE 0.9 % IR SOLN
Status: DC | PRN
  Administered 2021-01-09: 4000 mL

## 2021-01-09 MED ORDER — ONDANSETRON HCL 4 MG/2ML IJ SOLN: 4.0000 mg | Freq: Once | INTRAMUSCULAR | Status: DC | PRN

## 2021-01-09 MED ORDER — BISACODYL 10 MG RE SUPP: 10.0000 mg | Freq: Every day | RECTAL | Status: DC | PRN

## 2021-01-09 MED ORDER — ONDANSETRON HCL 4 MG/2ML IJ SOLN
INTRAMUSCULAR | Status: AC
  Filled 2021-01-09: qty 2

## 2021-01-09 MED ORDER — ATORVASTATIN CALCIUM 40 MG PO TABS
40.0000 mg | ORAL_TABLET | Freq: Every day | ORAL | Status: DC
  Administered 2021-01-09 – 2021-01-10 (×2): 40 mg via ORAL
  Filled 2021-01-09: qty 1

## 2021-01-09 MED ORDER — HYDROCODONE-ACETAMINOPHEN 5-325 MG PO TABS
1.0000 | ORAL_TABLET | ORAL | Status: DC | PRN
  Administered 2021-01-09 – 2021-01-10 (×3): 2 via ORAL
  Filled 2021-01-09 (×4): qty 2

## 2021-01-09 MED ORDER — FENTANYL CITRATE (PF) 100 MCG/2ML IJ SOLN: 50.0000 ug | Freq: Once | INTRAMUSCULAR | Status: AC

## 2021-01-09 MED ORDER — CHLORHEXIDINE GLUCONATE 0.12 % MT SOLN
OROMUCOSAL | Status: AC
  Administered 2021-01-09: 11:00:00 15 mL via OROMUCOSAL
  Filled 2021-01-09: qty 15

## 2021-01-09 MED ORDER — LIDOCAINE 2% (20 MG/ML) 5 ML SYRINGE
INTRAMUSCULAR | Status: DC | PRN
  Administered 2021-01-09: 60 mg via INTRAVENOUS

## 2021-01-09 MED ORDER — POLYETHYLENE GLYCOL 3350 17 G PO PACK: 17.0000 g | PACK | Freq: Every day | ORAL | Status: DC | PRN

## 2021-01-09 MED ORDER — MIDAZOLAM HCL 2 MG/2ML IJ SOLN
INTRAMUSCULAR | Status: AC
  Filled 2021-01-09: qty 2

## 2021-01-09 MED ORDER — BUPIVACAINE LIPOSOME 1.3 % IJ SUSP
INTRAMUSCULAR | Status: AC
  Filled 2021-01-09: qty 20

## 2021-01-09 MED ORDER — PROPOFOL 10 MG/ML IV BOLUS
INTRAVENOUS | Status: DC | PRN
  Administered 2021-01-09: 140 mg via INTRAVENOUS

## 2021-01-09 MED ORDER — MORPHINE SULFATE (PF) 2 MG/ML IV SOLN
0.5000 mg | INTRAVENOUS | Status: DC | PRN
  Administered 2021-01-10: 1 mg via INTRAVENOUS
  Filled 2021-01-09: qty 1

## 2021-01-09 MED ORDER — KETOROLAC TROMETHAMINE 30 MG/ML IJ SOLN
30.0000 mg | Freq: Once | INTRAMUSCULAR | Status: AC
  Administered 2021-01-09: 15:00:00 30 mg via INTRAVENOUS

## 2021-01-09 MED ORDER — ONDANSETRON HCL 4 MG/2ML IJ SOLN: 4.0000 mg | Freq: Four times a day (QID) | INTRAMUSCULAR | Status: DC | PRN

## 2021-01-09 MED ORDER — ONDANSETRON HCL 4 MG PO TABS: 4.0000 mg | ORAL_TABLET | Freq: Four times a day (QID) | ORAL | Status: DC | PRN

## 2021-01-09 MED ORDER — LACOSAMIDE 200 MG PO TABS
300.0000 mg | ORAL_TABLET | Freq: Two times a day (BID) | ORAL | Status: DC
  Administered 2021-01-09 – 2021-01-11 (×4): 300 mg via ORAL
  Filled 2021-01-09 (×4): qty 2

## 2021-01-09 MED ORDER — PHENOL 1.4 % MT LIQD: 1.0000 | OROMUCOSAL | Status: DC | PRN

## 2021-01-09 MED ORDER — FERROUS SULFATE 325 (65 FE) MG PO TABS
325.0000 mg | ORAL_TABLET | Freq: Three times a day (TID) | ORAL | Status: DC
  Administered 2021-01-09 – 2021-01-11 (×6): 325 mg via ORAL
  Filled 2021-01-09 (×6): qty 1

## 2021-01-09 MED ORDER — BUPIVACAINE HCL (PF) 0.25 % IJ SOLN
INTRAMUSCULAR | Status: AC
  Filled 2021-01-09: qty 30

## 2021-01-09 MED ORDER — SODIUM CHLORIDE 0.9 % IV SOLN: INTRAVENOUS | Status: DC

## 2021-01-09 MED ORDER — LOSARTAN POTASSIUM 50 MG PO TABS
25.0000 mg | ORAL_TABLET | Freq: Every day | ORAL | Status: DC
  Administered 2021-01-09 – 2021-01-11 (×3): 25 mg via ORAL
  Filled 2021-01-09 (×3): qty 1

## 2021-01-09 MED ORDER — CHLORHEXIDINE GLUCONATE 0.12 % MT SOLN: 15.0000 mL | Freq: Once | OROMUCOSAL | Status: AC

## 2021-01-09 MED ORDER — DIPHENHYDRAMINE HCL 12.5 MG/5ML PO ELIX: 12.5000 mg | ORAL_SOLUTION | ORAL | Status: DC | PRN

## 2021-01-09 MED ORDER — ALBUTEROL SULFATE (2.5 MG/3ML) 0.083% IN NEBU: 2.5000 mg | INHALATION_SOLUTION | Freq: Four times a day (QID) | RESPIRATORY_TRACT | Status: DC | PRN

## 2021-01-09 MED ORDER — ALBUTEROL SULFATE HFA 108 (90 BASE) MCG/ACT IN AERS: 1.0000 | INHALATION_SPRAY | Freq: Four times a day (QID) | RESPIRATORY_TRACT | Status: DC | PRN

## 2021-01-09 MED ORDER — METOCLOPRAMIDE HCL 5 MG PO TABS: 5.0000 mg | ORAL_TABLET | Freq: Three times a day (TID) | ORAL | Status: DC | PRN

## 2021-01-09 MED ORDER — FENTANYL CITRATE (PF) 100 MCG/2ML IJ SOLN
25.0000 ug | INTRAMUSCULAR | Status: DC | PRN
  Administered 2021-01-09: 16:00:00 50 ug via INTRAVENOUS

## 2021-01-09 MED ORDER — KETOROLAC TROMETHAMINE 30 MG/ML IJ SOLN
INTRAMUSCULAR | Status: AC
  Filled 2021-01-09: qty 1

## 2021-01-09 MED ORDER — HYDROMORPHONE HCL 1 MG/ML IJ SOLN
INTRAMUSCULAR | Status: AC
  Filled 2021-01-09: qty 1

## 2021-01-09 MED ORDER — ONDANSETRON HCL 4 MG/2ML IJ SOLN
INTRAMUSCULAR | Status: DC | PRN
  Administered 2021-01-09: 4 mg via INTRAVENOUS

## 2021-01-09 MED ORDER — FENTANYL CITRATE (PF) 250 MCG/5ML IJ SOLN
INTRAMUSCULAR | Status: AC
  Filled 2021-01-09: qty 5

## 2021-01-09 MED ORDER — TRANEXAMIC ACID 1000 MG/10ML IV SOLN
INTRAVENOUS | Status: DC | PRN
  Administered 2021-01-09: 1000 mg via INTRAVENOUS

## 2021-01-09 MED ORDER — PANTOPRAZOLE SODIUM 40 MG PO TBEC
40.0000 mg | DELAYED_RELEASE_TABLET | Freq: Two times a day (BID) | ORAL | Status: DC
  Administered 2021-01-09 – 2021-01-11 (×4): 40 mg via ORAL
  Filled 2021-01-09 (×4): qty 1

## 2021-01-09 MED ORDER — BENZONATATE 100 MG PO CAPS: 100.0000 mg | ORAL_CAPSULE | Freq: Three times a day (TID) | ORAL | Status: DC | PRN

## 2021-01-09 MED ORDER — FENTANYL CITRATE (PF) 100 MCG/2ML IJ SOLN
INTRAMUSCULAR | Status: AC
  Administered 2021-01-09: 12:00:00 50 ug via INTRAVENOUS
  Filled 2021-01-09: qty 2

## 2021-01-09 SURGICAL SUPPLY — 71 items
ATTUNE PS FEM RT SZ 4 CEM KNEE (Femur) ×2 IMPLANT
ATTUNE PSRP INSR SZ4 5 KNEE (Insert) ×2 IMPLANT
BAG COUNTER SPONGE SURGICOUNT (BAG) ×2 IMPLANT
BANDAGE ESMARK 6X9 LF (GAUZE/BANDAGES/DRESSINGS) ×1 IMPLANT
BASEPLATE TIBIAL ROTATING SZ 4 (Knees) ×2 IMPLANT
BENZOIN TINCTURE PRP APPL 2/3 (GAUZE/BANDAGES/DRESSINGS) ×2 IMPLANT
BLADE SAGITTAL 25.0X1.19X90 (BLADE) ×2 IMPLANT
BLADE SAW SGTL 13X75X1.27 (BLADE) ×2 IMPLANT
BNDG ELASTIC 4X5.8 VLCR STR LF (GAUZE/BANDAGES/DRESSINGS) ×2 IMPLANT
BNDG ELASTIC 6X10 VLCR STRL LF (GAUZE/BANDAGES/DRESSINGS) ×2 IMPLANT
BNDG ESMARK 6X9 LF (GAUZE/BANDAGES/DRESSINGS) ×2
BOWL SMART MIX CTS (DISPOSABLE) ×2 IMPLANT
CEMENT HV SMART SET (Cement) ×4 IMPLANT
COVER SURGICAL LIGHT HANDLE (MISCELLANEOUS) ×2 IMPLANT
CUFF TOURN SGL QUICK 34 (TOURNIQUET CUFF) ×2
CUFF TRNQT CYL 34X4.125X (TOURNIQUET CUFF) ×1 IMPLANT
DERMABOND ADVANCED (GAUZE/BANDAGES/DRESSINGS) ×1
DERMABOND ADVANCED .7 DNX12 (GAUZE/BANDAGES/DRESSINGS) ×1 IMPLANT
DRAPE ORTHO SPLIT 77X108 STRL (DRAPES) ×4
DRAPE SURG ORHT 6 SPLT 77X108 (DRAPES) ×2 IMPLANT
DRAPE U-SHAPE 47X51 STRL (DRAPES) ×2 IMPLANT
DRSG MEPILEX BORDER 4X12 (GAUZE/BANDAGES/DRESSINGS) ×2 IMPLANT
DRSG PAD ABDOMINAL 8X10 ST (GAUZE/BANDAGES/DRESSINGS) ×2 IMPLANT
DURAPREP 26ML APPLICATOR (WOUND CARE) ×4 IMPLANT
ELECT REM PT RETURN 9FT ADLT (ELECTROSURGICAL) ×2
ELECTRODE REM PT RTRN 9FT ADLT (ELECTROSURGICAL) ×1 IMPLANT
FACESHIELD WRAPAROUND (MASK) ×2 IMPLANT
GAUZE SPONGE 4X4 12PLY STRL (GAUZE/BANDAGES/DRESSINGS) ×2 IMPLANT
GAUZE XEROFORM 5X9 LF (GAUZE/BANDAGES/DRESSINGS) ×2 IMPLANT
GLOVE SRG 8 PF TXTR STRL LF DI (GLOVE) ×2 IMPLANT
GLOVE SURG ORTHO LTX SZ7.5 (GLOVE) ×4 IMPLANT
GLOVE SURG UNDER POLY LF SZ8 (GLOVE) ×4
GOWN STRL REUS W/ TWL LRG LVL3 (GOWN DISPOSABLE) ×1 IMPLANT
GOWN STRL REUS W/ TWL XL LVL3 (GOWN DISPOSABLE) ×1 IMPLANT
GOWN STRL REUS W/TWL LRG LVL3 (GOWN DISPOSABLE) ×2
GOWN STRL REUS W/TWL XL LVL3 (GOWN DISPOSABLE) ×2
HANDPIECE INTERPULSE COAX TIP (DISPOSABLE) ×2
IMMOBILIZER KNEE 20 (SOFTGOODS) ×2
IMMOBILIZER KNEE 20 THIGH 36 (SOFTGOODS) ×1 IMPLANT
IMMOBILIZER KNEE 22 UNIV (SOFTGOODS) ×2 IMPLANT
KIT BASIN OR (CUSTOM PROCEDURE TRAY) ×2 IMPLANT
KIT TURNOVER KIT B (KITS) ×2 IMPLANT
MANIFOLD NEPTUNE II (INSTRUMENTS) ×4 IMPLANT
MARKER SKIN DUAL TIP RULER LAB (MISCELLANEOUS) ×2 IMPLANT
NEEDLE 18GX1X1/2 (RX/OR ONLY) (NEEDLE) ×2 IMPLANT
NEEDLE HYPO 25GX1X1/2 BEV (NEEDLE) ×2 IMPLANT
NS IRRIG 1000ML POUR BTL (IV SOLUTION) ×2 IMPLANT
PACK TOTAL JOINT (CUSTOM PROCEDURE TRAY) ×2 IMPLANT
PAD ARMBOARD 7.5X6 YLW CONV (MISCELLANEOUS) ×4 IMPLANT
PAD CAST 4YDX4 CTTN HI CHSV (CAST SUPPLIES) ×1 IMPLANT
PADDING CAST ABS 6INX4YD NS (CAST SUPPLIES) ×1
PADDING CAST ABS COTTON 6X4 NS (CAST SUPPLIES) ×1 IMPLANT
PADDING CAST COTTON 4X4 STRL (CAST SUPPLIES) ×2
PADDING CAST COTTON 6X4 STRL (CAST SUPPLIES) ×2 IMPLANT
PATELLA MEDIAL ATTUN 35MM KNEE (Knees) ×2 IMPLANT
SET HNDPC FAN SPRY TIP SCT (DISPOSABLE) ×1 IMPLANT
STAPLER VISISTAT 35W (STAPLE) IMPLANT
SUCTION FRAZIER HANDLE 10FR (MISCELLANEOUS) ×2
SUCTION TUBE FRAZIER 10FR DISP (MISCELLANEOUS) ×1 IMPLANT
SUT VIC AB 0 CT1 27 (SUTURE) ×2
SUT VIC AB 0 CT1 27XBRD ANBCTR (SUTURE) ×1 IMPLANT
SUT VIC AB 1 CTX 36 (SUTURE) ×4
SUT VIC AB 1 CTX36XBRD ANBCTR (SUTURE) ×2 IMPLANT
SUT VIC AB 2-0 CT1 27 (SUTURE) ×6
SUT VIC AB 2-0 CT1 TAPERPNT 27 (SUTURE) ×3 IMPLANT
SUT VIC AB 3-0 X1 27 (SUTURE) ×2 IMPLANT
SYR 50ML LL SCALE MARK (SYRINGE) ×2 IMPLANT
SYR CONTROL 10ML LL (SYRINGE) ×2 IMPLANT
TOWEL GREEN STERILE (TOWEL DISPOSABLE) ×2 IMPLANT
TOWEL GREEN STERILE FF (TOWEL DISPOSABLE) ×2 IMPLANT
TRAY FOLEY SLVR 14FR TEMP STAT (SET/KITS/TRAYS/PACK) ×2 IMPLANT

## 2021-01-09 NOTE — Anesthesia Procedure Notes (Signed)
Procedure Name: Intubation Date/Time: 01/09/2021 1:01 PM Performed by: Michele Rockers, CRNA Pre-anesthesia Checklist: Patient identified, Patient being monitored, Timeout performed, Emergency Drugs available and Suction available Patient Re-evaluated:Patient Re-evaluated prior to induction Oxygen Delivery Method: Circle system utilized Preoxygenation: Pre-oxygenation with 100% oxygen Induction Type: IV induction Ventilation: Mask ventilation without difficulty Laryngoscope Size: Miller and 2 Grade View: Grade I Tube type: Oral Tube size: 7.5 mm Number of attempts: 1 Airway Equipment and Method: Stylet Placement Confirmation: ETT inserted through vocal cords under direct vision, positive ETCO2 and breath sounds checked- equal and bilateral Secured at: 21 cm Tube secured with: Tape Dental Injury: Teeth and Oropharynx as per pre-operative assessment

## 2021-01-09 NOTE — Op Note (Signed)
Preop diagnosis: Right knee primary osteoarthritis  Postop diagnosis: Same  Procedure: Right total knee arthroplasty.  Surgeon: Lorin Mercy MD  Assistant April Fulp, RNFA  Anesthesia General orotracheal plus Marcaine and Exparel 20+20.  EBL: Less than 100 cc.  Implants:Implants  CEMENT HV SMART SET - [id number]  Inventory Item: CEMENT HV SMART SET Serial no.:  Model/Cat no.: [id number]  Implant name: CEMENT HV SMART SET - [id number] Laterality: Right Area: Knee  Manufacturer: [place] Date of Manufacture:    Action: Implanted Number Used: 2   Device Identifier:  Device Identifier Type:     BASEPLATE TIBIAL ROTATING SZ 4 - [id number]  Inventory Item: BASEPLATE TIBIAL ROTATING SZ 4 Serial no.:  Model/Cat no.: [id number]  Implant name: BASEPLATE TIBIAL ROTATING SZ 4 - [id number] Laterality: Right Area: Knee  Manufacturer: [employer] Date of Manufacture:    Action: Implanted Number Used: 1   Device Identifier:  Device Identifier Type:     ATTUNE PSRP INSR SZ4 5MM KNEE - [id number]  Inventory Item: ATTUNE PSRP INSR SZ4 5MM KNEE Serial no.:  Model/Cat no.: [id number]  Implant name: ATTUNE PSRP INSR SZ4 5MM KNEE - [id number] Laterality: Right Area: Knee  Manufacturer: [place] Date of Manufacture:    Action: Implanted Number Used: 1   Device Identifier:  Device Identifier Type:     ATTUNE PS FEM RT SZ 4 CEM KNEE - [id number]  Inventory Item: ATTUNE PS FEM RT SZ 4 CEM KNEE Serial no.:  Model/Cat no.: [id number]  Implant name: ATTUNE PS FEM RT SZ 4 CEM KNEE - [id number] Laterality: Right Area: Knee  Manufacturer: [employer] Date of Manufacture:    Action: Implanted Number Used: 1   Device Identifier:  Device Identifier Type:     PATELLA MEDIAL ATTUN 35MM KNEE - [id number]  Inventory Item: PATELLA MEDIAL ATTUN 35MM KNEE Serial no.:  Model/Cat no.: [id number]  Implant name: PATELLA MEDIAL ATTUN 35MM KNEE - [id number] Laterality: Right Area: Knee  Manufacturer:  [employer] Date of Manufacture:    Action: Implanted Number Used: 1   Device Identifier:  Device Identifier Type:    Procedure: After patient was transferred the operating table stretcher was noted to be wet with urine and Foley catheter was placed.  Proximal thigh tourniquet heel bump lateral post was applied general anesthesia was used and standard prepping with DuraPrep preoperative Ancef and preoperative IV TXA were all used.  Sterile skin marker usual total knee sheets drapes impervious stockinette Coban were all applied Betadine Steri-Drape sealing the skin.  After timeout procedure leg was wrapped in Esmarch tourniquet inflated midline incision was made.  Medial parapatellar incision was made after marking the capsule with a purple marker for later repair.  Quad tendon was split between the medial one third lateral two thirds patella was everted 10 mm resected.  Intramedullary hole made in the femur initially cut of 10 did not take enough bone on the distal femur as we backed up 2 mm repent and cut 2 additional millimeters.  10 taken off the tibial side.  There was bone-on-bone changes medial compartment meniscal remnants were resected and there was chondrocalcinosis involving meniscus with mucinous degeneration.  ACL PCL was resected box cut made in the femur after chamfer cuts keel preparation/for the femur size 4 on the tibia.  5 mm spacer block gave full extension.  Pulse lavage posterior spurs resected off the medial femoral condyle none needed to be resected on the lateral femoral condyle.  Cement was vacuum mixed.  Tibia  cemented first removal of excessive cement cementing of the femur placement of the 10 mm rotating platform poly-5 mm thickness.  Patella button was 35 mm 3 peg button cemented into place held with the clamp.  Exparel Marcaine 20+28 was 40 cc was injected while cement was setting up cement was hardened 15 minutes standard layered closure was performed with a closure of  the quad tendon medial capsulotomy subtenons tissue and for particular skin closure tincture benzoin Steri-Strips Mepilex 4 x 4's web roll and Ace wrap with knee immobilizer was applied.

## 2021-01-09 NOTE — Anesthesia Procedure Notes (Signed)
Anesthesia Regional Block: Adductor canal block   Pre-Anesthetic Checklist: , timeout performed,  Correct Patient, Correct Site, Correct Laterality,  Correct Procedure, Correct Position, site marked,  Risks and benefits discussed,  Surgical consent,  Pre-op evaluation,  At surgeon's request and post-op pain management  Laterality: Right  Prep: chloraprep       Needles:  Injection technique: Single-shot  Needle Type: Echogenic Needle     Needle Length: 10cm  Needle Gauge: 21     Additional Needles:   Narrative:  Start time: 01/09/2021 11:40 AM End time: 01/09/2021 11:43 AM Injection made incrementally with aspirations every 5 mL.  Performed by: Personally  Anesthesiologist: Audry Pili, MD  Additional Notes: No pain on injection. No increased resistance to injection. Injection made in 5cc increments. Good needle visualization. Patient tolerated the procedure well.

## 2021-01-09 NOTE — Evaluation (Signed)
Physical Therapy Evaluation Patient Details Name: Courtney Mcconnell MRN: [id number] DOB: [birth year] Today's Date: 01/09/2021  History of Present Illness  Pt is 65 yo female s/p R TKA on 01/09/21.  Pt with hx including but not limited to RA, OA, CAD, HLD, NSTEMI, seizure disorder, and sleep apnea.  Clinical Impression  Pt is s/p TKA resulting in the deficits listed below (see PT Problem List). At baseline, pt able to ambulate short community distances without AD and has had difficulty with lower body dressing due to R knee pain/stiffness.  She plans to stay at her daughter's house in order to have support and less steps to negotiate. Today, pt initially apprehensive about early mobility but agreed and did well.  She was able to take small steps to the chair with assist of 1 and cues for all transfers.  Pt with fair quad activation, good pain control , and limited ROM 5 to 30 degrees.  Pt expected to progress well. Pt will benefit from skilled PT to increase their independence and safety with mobility to allow discharge to the venue listed below.         Recommendations for follow up therapy are one component of a multi-disciplinary discharge planning process, led by the attending physician.  Recommendations may be updated based on patient status, additional functional criteria and insurance authorization.  Follow Up Recommendations Follow physician's recommendations for discharge plan and follow up therapies    Assistance Recommended at Discharge Frequent or constant Supervision/Assistance  Functional Status Assessment Patient has had a recent decline in their functional status and demonstrates the ability to make significant improvements in function in a reasonable and predictable amount of time.  Equipment Recommendations  Rolling walker (2 wheels)    Recommendations for Other Services       Precautions / Restrictions Precautions Precautions: Fall Required Braces or Orthoses: Knee  Immobilizer - Right Knee Immobilizer - Right: Other (comment) (Except when in CPM or with PT) Restrictions Weight Bearing Restrictions: Yes RLE Weight Bearing: Weight bearing as tolerated      Mobility  Bed Mobility Overal bed mobility: Needs Assistance Bed Mobility: Supine to Sit     Supine to sit: Min assist     General bed mobility comments: Min A for R LE and HHA to lift trunk fully ; increased time with cues for sequencing    Transfers Overall transfer level: Needs assistance Equipment used: Rolling walker (2 wheels) Transfers: Sit to/from Stand;Bed to chair/wheelchair/BSC Sit to Stand: Min assist   Step pivot transfers: Min assist       General transfer comment: cues for hand placement and R LE management; small steps to chair with min A to steady and cues for sequencing    Ambulation/Gait             Pre-gait activities: Worked on weight shifting with RW prior to taking steps to chair    Stairs            Wheelchair Mobility    Modified Rankin (Stroke Patients Only)       Balance Overall balance assessment: Needs assistance Sitting-balance support: No upper extremity supported Sitting balance-Leahy Scale: Normal     Standing balance support: Bilateral upper extremity supported;Reliant on assistive device for balance Standing balance-Leahy Scale: Poor Standing balance comment: RW and min guard                             Pertinent Vitals/Pain  Pain Assessment: 0-10 Pain Score: 2  Pain Location: R knee Pain Descriptors / Indicators: Discomfort Pain Intervention(s): Limited activity within patient's tolerance;Monitored during session;RN gave pain meds during session    [place] expects to be discharged to:: Private residence Living Arrangements: Children (children and grandchildren) Available Help at Discharge: Family;Available 24 hours/day Type of Home: House Home Access: Stairs to enter Entrance  Stairs-Rails: None Entrance Stairs-Number of Steps: 3   Home Layout: Two level;Able to live on main level with bedroom/bathroom Home Equipment: Wheelchair - manual;Shower seat;Grab bars - tub/shower;Grab bars - toilet;Cane - single point      Prior Function Prior Level of Function : Needs assist       Physical Assist : ADLs (physical)   ADLs (physical): Dressing;Bathing Mobility Comments: Pt ambulated without AD. She could ambulate in house and short community distances; but if out shopping took her w/c so that she could take rest breaks ADLs Comments: Pt reports difficulty with lower body dressing because of knee and had assist in/out shower but once in could shower on her own     Hand Dominance        Extremity/Trunk Assessment   Upper Extremity Assessment Upper Extremity Assessment: Overall WFL for tasks assessed (ROM WFL; MMT 5/5)    Lower Extremity Assessment Lower Extremity Assessment: LLE deficits/detail;RLE deficits/detail RLE Deficits / Details: Expected post op changes; ROM : ankle and hip WFL, Knee 5 to 30 degrees limited by pain; MMT: ankle 5/5, hip and knee 3/5 RLE Sensation: WNL LLE Deficits / Details: ROM WFL: MMT 5/5 LLE Sensation: WNL    Cervical / Trunk Assessment Cervical / Trunk Assessment: Normal  Communication   Communication: No difficulties  Cognition Arousal/Alertness: Awake/alert Behavior During Therapy: WFL for tasks assessed/performed Overall Cognitive Status: Within Functional Limits for tasks assessed                                          General Comments      Exercises     Assessment/Plan    PT Assessment Patient needs continued PT services  PT Problem List Decreased strength;Decreased mobility;Decreased range of motion;Decreased knowledge of precautions;Decreased activity tolerance;Decreased balance;Pain;Decreased knowledge of use of DME       PT Treatment Interventions DME instruction;Therapeutic  activities;Modalities;Gait training;Therapeutic exercise;Patient/family education;Stair training;Balance training;Functional mobility training    PT Goals (Current goals can be found in the Care Plan section)  Acute Rehab PT Goals Patient Stated Goal: return home and go shopping PT Goal Formulation: With patient/family Time For Goal Achievement: 01/23/21 Potential to Achieve Goals: Good    Frequency 7X/week   Barriers to discharge        Co-evaluation               AM-PAC PT "6 Clicks" Mobility  Outcome Measure Help needed turning from your back to your side while in a flat bed without using bedrails?: A Little Help needed moving from lying on your back to sitting on the side of a flat bed without using bedrails?: A Little Help needed moving to and from a bed to a chair (including a wheelchair)?: A Little Help needed standing up from a chair using your arms (e.g., wheelchair or bedside chair)?: A Little Help needed to walk in hospital room?: A Little Help needed climbing 3-5 steps with a railing? : A Lot 6 Click Score: 17  End of Session Equipment Utilized During Treatment: Gait belt Activity Tolerance: Patient tolerated treatment well Patient left: with chair alarm set;in chair;with call bell/phone within reach;with family/visitor present Nurse Communication: Mobility status PT Visit Diagnosis: Other abnormalities of gait and mobility (R26.89);Muscle weakness (generalized) (M62.81)    Time: 1594-7076 PT Time Calculation (min) (ACUTE ONLY): 32 min   Charges:   PT Evaluation $PT Eval Low Complexity: 1 Low PT Treatments $Therapeutic Activity: 8-22 mins        Abran Richard, PT Acute Rehab Services Pager [phone] [facility] [phone]   Karlton Lemon 01/09/2021, 5:42 PM

## 2021-01-09 NOTE — Plan of Care (Signed)
Care plan  

## 2021-01-09 NOTE — Transfer of Care (Signed)
Immediate Anesthesia Transfer of Care Note  Patient: Courtney Mcconnell  Procedure(s) Performed: RIGHT TOTAL KNEE ARTHROPLASTY (Right: Knee)  Patient Location: PACU  Anesthesia Type:General  Level of Consciousness: alert   Airway & Oxygen Therapy: Patient Spontanous Breathing and Patient connected to face mask oxygen  Post-op Assessment: Report given to RN and Post -op Vital signs reviewed and stable  Post vital signs: Reviewed and stable  Last Vitals:  Vitals Value Taken Time  BP 187/94 01/09/21 1509  Temp 36.7 C 01/09/21 1509  Pulse 78 01/09/21 1513  Resp 14 01/09/21 1513  SpO2 88 % 01/09/21 1513  Vitals shown include unvalidated device data.  Last Pain:  Vitals:   01/09/21 1509  TempSrc:   PainSc: 10-Worst pain ever      Patients Stated Pain Goal: 2 (31/12/16 2446)  Complications: No notable events documented.

## 2021-01-09 NOTE — Interval H&P Note (Signed)
History and Physical Interval Note:  01/09/2021 12:18 PM  Courtney Mcconnell  has presented today for surgery, with the diagnosis of right knee osteoarthritis.  The various methods of treatment have been discussed with the patient and family. After consideration of risks, benefits and other options for treatment, the patient has consented to  Procedure(s) with comments: RIGHT TOTAL KNEE ARTHROPLASTY (Right) - Needs RNFA as a surgical intervention.  The patient's history has been reviewed, patient examined, no change in status, stable for surgery.  I have reviewed the patient's chart and labs.  Questions were answered to the patient's satisfaction.     Courtney Mcconnell

## 2021-01-10 ENCOUNTER — Encounter (HOSPITAL_COMMUNITY): Payer: Self-pay | Admitting: Orthopaedic Surgery

## 2021-01-10 DIAGNOSIS — J449 Chronic obstructive pulmonary disease, unspecified: Secondary | ICD-10-CM | POA: Diagnosis not present

## 2021-01-10 DIAGNOSIS — J45909 Unspecified asthma, uncomplicated: Secondary | ICD-10-CM | POA: Diagnosis not present

## 2021-01-10 DIAGNOSIS — I2510 Atherosclerotic heart disease of native coronary artery without angina pectoris: Secondary | ICD-10-CM | POA: Diagnosis not present

## 2021-01-10 DIAGNOSIS — I10 Essential (primary) hypertension: Secondary | ICD-10-CM | POA: Diagnosis not present

## 2021-01-10 DIAGNOSIS — Z7982 Long term (current) use of aspirin: Secondary | ICD-10-CM | POA: Diagnosis not present

## 2021-01-10 DIAGNOSIS — M1711 Unilateral primary osteoarthritis, right knee: Secondary | ICD-10-CM | POA: Diagnosis not present

## 2021-01-10 LAB — CBC
HCT: 31.8 % — ABNORMAL LOW (ref 36.0–46.0)
Hemoglobin: 10.3 g/dL — ABNORMAL LOW (ref 12.0–15.0)
MCH: 32.7 pg (ref 26.0–34.0)
MCHC: 32.4 g/dL (ref 30.0–36.0)
MCV: 101 fL — ABNORMAL HIGH (ref 80.0–100.0)
Platelets: 175 10*3/uL (ref 150–400)
RBC: 3.15 MIL/uL — ABNORMAL LOW (ref 3.87–5.11)
RDW: 13.1 % (ref 11.5–15.5)
WBC: 7.5 10*3/uL (ref 4.0–10.5)
nRBC: 0 % (ref 0.0–0.2)

## 2021-01-10 NOTE — Progress Notes (Signed)
Patient did not want stronger pain medication in order to go higher on CPM at this time.  Currently she has CPM on going from 0 - 25.  Patient also does not want ice pack on her knee.  She stated that she wanted a heating pad.  Explained to patient that an ice pack would help with pain and swelling.  She can discuss with MD regarding heating pad.

## 2021-01-10 NOTE — Discharge Instructions (Signed)

## 2021-01-10 NOTE — Progress Notes (Signed)
Physical Therapy Treatment Patient Details Name: Courtney Mcconnell MRN: [id number] DOB: 07/15/1955 Today's Date: 01/10/2021   History of Present Illness Pt is 65 yo female s/p R TKA on 01/09/21.  Pt with hx including but not limited to RA, OA, CAD, HLD, NSTEMI, seizure disorder, and sleep apnea.    PT Comments    Patient progressing well towards PT goals. Session focused on gait training and transfers. Pt reluctant to flex knee due to pain. Does not like the CPM machine. Instructed pt in there ex and importance of positioning of left knee to improve knee extension AROM. Knee AAROM 5-50 degrees. Encouraged up to chair for all meals, walking to bathroom with nursing and there ex. Pt wants to d/c home today however discussed that tomorrow might be more realistic as pt needs to be able to negotiate steps to enter home. Will follow for PM session.    Recommendations for follow up therapy are one component of a multi-disciplinary discharge planning process, led by the attending physician.  Recommendations may be updated based on patient status, additional functional criteria and insurance authorization.  Follow Up Recommendations  Follow physician's recommendations for discharge plan and follow up therapies     Assistance Recommended at Discharge Frequent or constant Supervision/Assistance  Equipment Recommendations  Rolling walker (2 wheels)    Recommendations for Other Services       Precautions / Restrictions Precautions Precautions: Fall Required Braces or Orthoses: Knee Immobilizer - Right Knee Immobilizer - Right: Other (comment);On except when in CPM (or with PT) Restrictions Weight Bearing Restrictions: Yes RLE Weight Bearing: Weight bearing as tolerated     Mobility  Bed Mobility Overal bed mobility: Needs Assistance Bed Mobility: Supine to Sit     Supine to sit: Min guard;HOB elevated     General bed mobility comments: Able to bring RLE to EOB without assist, heavy use  of rail.    Transfers Overall transfer level: Needs assistance Equipment used: Rolling walker (2 wheels) Transfers: Sit to/from Stand Sit to Stand: Mod assist           General transfer comment: ASsist to power to standing with cues for hand placement/technique and right foot placement. Stood from [employer], from elevated toilet x1. Transferred to chair post ambulation.    Ambulation/Gait Ambulation/Gait assistance: Min guard Gait Distance (Feet): 15 Feet (x2 bouts) Assistive device: Rolling walker (2 wheels) Gait Pattern/deviations: Step-to pattern;Decreased stance time - right;Decreased step length - left;Decreased weight shift to right;Trunk flexed;Knee flexed in stance - right Gait velocity: decreased     General Gait Details: SLow, mildly unsteady gait with cues to increase step lengths, step through gait and knee extension during stance phase, cues for sequencing with RW.   Stairs             Wheelchair Mobility    Modified Rankin (Stroke Patients Only)       Balance Overall balance assessment: Needs assistance Sitting-balance support: Feet supported;No upper extremity supported Sitting balance-Leahy Scale: Good     Standing balance support: During functional activity Standing balance-Leahy Scale: Poor Standing balance comment: RW and min guard, able to perform pericare with 1 UE support.                            Cognition Arousal/Alertness: Awake/alert Behavior During Therapy: WFL for tasks assessed/performed Overall Cognitive Status: Within Functional Limits for tasks assessed  Exercises Total Joint Exercises Ankle Circles/Pumps: AROM;Both;10 reps;Supine Quad Sets: AROM;Both;10 reps;Supine Long Arc Quad: Right;5 reps;AAROM;Seated Knee Flexion: AAROM;Right;5 reps;Seated Goniometric ROM: ~5-50 degrees knee AAROM    General Comments General comments (skin integrity, edema,  etc.): Granddaughter present during session.      Pertinent Vitals/Pain Pain Assessment: 0-10 Pain Score: 8  Pain Location: R knee Pain Descriptors / Indicators: Discomfort;Sore;Operative site guarding Pain Intervention(s): Monitored during session;Premedicated before session;Limited activity within patient's tolerance;Ice applied    Home Living                          Prior Function            PT Goals (current goals can now be found in the care plan section) Progress towards PT goals: Progressing toward goals    Frequency    7X/week      PT Plan      Co-evaluation              AM-PAC PT "6 Clicks" Mobility   Outcome Measure  Help needed turning from your back to your side while in a flat bed without using bedrails?: A Little Help needed moving from lying on your back to sitting on the side of a flat bed without using bedrails?: A Little Help needed moving to and from a bed to a chair (including a wheelchair)?: A Lot Help needed standing up from a chair using your arms (e.g., wheelchair or bedside chair)?: A Lot Help needed to walk in hospital room?: A Little Help needed climbing 3-5 steps with a railing? : A Lot 6 Click Score: 15    End of Session Equipment Utilized During Treatment: Gait belt Activity Tolerance: Patient tolerated treatment well Patient left: in chair;with call bell/phone within reach;with family/visitor present Nurse Communication: Mobility status PT Visit Diagnosis: Other abnormalities of gait and mobility (R26.89);Muscle weakness (generalized) (M62.81);Pain Pain - Right/Left: Right Pain - part of body: Knee     Time: 0802-0830 PT Time Calculation (min) (ACUTE ONLY): 28 min  Charges:  $Gait Training: 8-22 mins $Therapeutic Activity: 8-22 mins                     Marisa Severin, PT, DPT Acute Rehabilitation Services Pager [phone] Office [place] 01/10/2021, 10:05 AM

## 2021-01-10 NOTE — Anesthesia Postprocedure Evaluation (Signed)
Anesthesia Post Note  Patient: Courtney Mcconnell  Procedure(s) Performed: RIGHT TOTAL KNEE ARTHROPLASTY (Right: Knee)     Patient location during evaluation: PACU Anesthesia Type: Spinal Level of consciousness: oriented and awake and alert Pain management: pain level controlled Vital Signs Assessment: post-procedure vital signs reviewed and stable Respiratory status: spontaneous breathing, respiratory function stable and patient connected to nasal cannula oxygen Cardiovascular status: blood pressure returned to baseline and stable Postop Assessment: no headache, no backache and no apparent nausea or vomiting Anesthetic complications: no   No notable events documented.  Last Vitals:  Vitals:   01/10/21 0900 01/10/21 0933  BP: (!) 146/63   Pulse: 67   Resp: 17   Temp: 36.9 C   SpO2: 93% 94%                Audry Pili

## 2021-01-10 NOTE — Progress Notes (Signed)
Ortho Tech has been contacted. Will bring CPM and place on patient around 0600 in the morning.

## 2021-01-10 NOTE — Progress Notes (Signed)
Orthopedic Tech Progress Note Patient Details:  [facility] 1955/04/27 [id number]  CPM Right Knee CPM Right Knee: On Right Knee Flexion (Degrees): 25 Right Knee Extension (Degrees): 0 Additional Comments: Patient was having to much pain to go higher.  Post Interventions Patient Tolerated: Fair Instructions Provided: Care of device, Adjustment of device  Karolee Stamps 01/10/2021, 6:50 AM

## 2021-01-10 NOTE — Progress Notes (Signed)
Removed ace wrap and bandage per surgeons orders

## 2021-01-10 NOTE — Progress Notes (Signed)
Physical Therapy Treatment Patient Details Name: Courtney Mcconnell MRN: [id number] DOB: [birth year] Today's Date: 01/10/2021   History of Present Illness Pt is 65 yo female s/p R TKA on 01/09/21.  Pt with hx including but not limited to RA, OA, CAD, HLD, NSTEMI, seizure disorder, and sleep apnea.    PT Comments    Patient progressing slowly towards PT goals. Session focused on progressive ambulation and transfers. Pt fatigues quickly and is limited by pain but motivated to improve overall function and mobilize. Encouraged icing knee to help with swelling/pain. Pt reports she does not like the CPM. Plan for stair training tomorrow to prepare for d/c home. Will follow.   Recommendations for follow up therapy are one component of a multi-disciplinary discharge planning process, led by the attending physician.  Recommendations may be updated based on patient status, additional functional criteria and insurance authorization.  Follow Up Recommendations  Follow physician's recommendations for discharge plan and follow up therapies     Assistance Recommended at Discharge Frequent or constant Supervision/Assistance  Equipment Recommendations  Rolling walker (2 wheels)    Recommendations for Other Services       Precautions / Restrictions Precautions Precautions: Fall Required Braces or Orthoses: Knee Immobilizer - Right Knee Immobilizer - Right: Other (comment);On except when in CPM Restrictions Weight Bearing Restrictions: Yes RLE Weight Bearing: Weight bearing as tolerated     Mobility  Bed Mobility Overal bed mobility: Needs Assistance Bed Mobility: Sit to Supine       Sit to supine: Min assist;HOB elevated   General bed mobility comments: ASsist to bring RLE into bed to return to supine.    Transfers Overall transfer level: Needs assistance Equipment used: Rolling walker (2 wheels) Transfers: Sit to/from Stand;Bed to chair/wheelchair/BSC Sit to Stand: Min assist Stand  pivot transfers: Min guard         General transfer comment: ASsist to power to standing with cues for right foot placement as pt keeps RLE extended during transfers as habit. SPT chair to [facility], and chair to bed.    Ambulation/Gait Ambulation/Gait assistance: Min guard Gait Distance (Feet): 28 Feet Assistive device: Rolling walker (2 wheels) Gait Pattern/deviations: Step-to pattern;Decreased stance time - right;Decreased step length - left;Decreased weight shift to right;Trunk flexed;Knee flexed in stance - right Gait velocity: decreased     General Gait Details: Slow, mildly unsteady gait with cues for step through gait and knee flexion during swing phase as well as upright. 2/4 DOE.   Stairs             Wheelchair Mobility    Modified Rankin (Stroke Patients Only)       Balance Overall balance assessment: Needs assistance Sitting-balance support: Feet supported;No upper extremity supported Sitting balance-Leahy Scale: Good     Standing balance support: During functional activity Standing balance-Leahy Scale: Fair Standing balance comment: Able to perform pericare in standing with 1 UE support and close Min guard                            Cognition Arousal/Alertness: Awake/alert Behavior During Therapy: WFL for tasks assessed/performed Overall Cognitive Status: Within Functional Limits for tasks assessed                                          Exercises      General Comments General  comments (skin integrity, edema, etc.): Granddaughter present during session. pt reports she does not like the CPM machine at all.      Pertinent Vitals/Pain Pain Assessment: Faces Faces Pain Scale: Hurts even more Pain Location: R knee Pain Descriptors / Indicators: Discomfort;Sore;Operative site guarding Pain Intervention(s): Monitored during session;Limited activity within patient's tolerance;Premedicated before session;Ice applied    Home  Living                          Prior Function            PT Goals (current goals can now be found in the care plan section) Progress towards PT goals: Progressing toward goals    Frequency    7X/week      PT Plan Current plan remains appropriate    Co-evaluation              AM-PAC PT "6 Clicks" Mobility   Outcome Measure  Help needed turning from your back to your side while in a flat bed without using bedrails?: A Little Help needed moving from lying on your back to sitting on the side of a flat bed without using bedrails?: A Little Help needed moving to and from a bed to a chair (including a wheelchair)?: A Little Help needed standing up from a chair using your arms (e.g., wheelchair or bedside chair)?: A Little Help needed to walk in hospital room?: A Little Help needed climbing 3-5 steps with a railing? : A Lot 6 Click Score: 17    End of Session Equipment Utilized During Treatment: Gait belt Activity Tolerance: Patient tolerated treatment well;Patient limited by fatigue Patient left: in bed;with call bell/phone within reach;with family/visitor present Nurse Communication: Mobility status PT Visit Diagnosis: Other abnormalities of gait and mobility (R26.89);Muscle weakness (generalized) (M62.81);Pain Pain - Right/Left: Right Pain - part of body: Knee     Time: 1431-1500 PT Time Calculation (min) (ACUTE ONLY): 29 min  Charges:  $Gait Training: 8-22 mins $Therapeutic Activity: 8-22 mins                     Marisa Severin, PT, DPT Acute Rehabilitation Services Pager [phone] Office [place] 01/10/2021, 4:00 PM

## 2021-01-10 NOTE — Progress Notes (Signed)
Patient ID: Courtney Mcconnell, female   DOB: [birth year], 65 y.o.   MRN: [id number]   Subjective: 1 Day Post-Op Procedure(s) (LRB): RIGHT TOTAL KNEE ARTHROPLASTY (Right) Patient reports pain as moderate.   " I don't like ice and don't like the CPM machine "  Objective: Vital signs in last 24 hours: Temp:  [97.7 F (36.5 C)-98.3 F (36.8 C)] 98 F (36.7 C) (11/29 0400) Pulse Rate:  [62-74] 74 (11/29 0400) Resp:  [14-22] 18 (11/29 0400) BP: (123-190)/(63-93) 139/77 (11/29 0400) SpO2:  [94 %-100 %] 99 % (11/29 0400) Weight:  [108.9 kg] 108.9 kg (11/28 1103)  Intake/Output from previous day: 11/28 0701 - 11/29 0700 In: 1285 [P.O.:240; I.V.:1044.8; IV Piggyback:0.2] Out: 2900 [Urine:2700; Blood:200] Intake/Output this shift: No intake/output data recorded.  Recent Labs    01/10/21 0052  HGB 10.3*   Recent Labs    01/10/21 0052  WBC 7.5  RBC 3.15*  HCT 31.8*  PLT 175   No results for input(s): NA, K, CL, CO2, BUN, CREATININE, GLUCOSE, CALCIUM in the last 72 hours. No results for input(s): LABPT, INR in the last 72 hours.  Neurologically intact No results found.  Assessment/Plan: 1 Day Post-Op Procedure(s) (LRB): RIGHT TOTAL KNEE ARTHROPLASTY (Right) Up with therapy. Patient states does not like CPM machine and does not want it for home. Continue PT   [name] 01/10/2021, 7:44 AM

## 2021-01-11 DIAGNOSIS — M1711 Unilateral primary osteoarthritis, right knee: Secondary | ICD-10-CM | POA: Diagnosis not present

## 2021-01-11 DIAGNOSIS — I2510 Atherosclerotic heart disease of native coronary artery without angina pectoris: Secondary | ICD-10-CM | POA: Diagnosis not present

## 2021-01-11 DIAGNOSIS — J449 Chronic obstructive pulmonary disease, unspecified: Secondary | ICD-10-CM | POA: Diagnosis not present

## 2021-01-11 DIAGNOSIS — I10 Essential (primary) hypertension: Secondary | ICD-10-CM | POA: Diagnosis not present

## 2021-01-11 DIAGNOSIS — Z7982 Long term (current) use of aspirin: Secondary | ICD-10-CM | POA: Diagnosis not present

## 2021-01-11 DIAGNOSIS — J45909 Unspecified asthma, uncomplicated: Secondary | ICD-10-CM | POA: Diagnosis not present

## 2021-01-11 LAB — CBC
HCT: 31.4 % — ABNORMAL LOW (ref 36.0–46.0)
Hemoglobin: 10.4 g/dL — ABNORMAL LOW (ref 12.0–15.0)
MCH: 33.3 pg (ref 26.0–34.0)
MCHC: 33.1 g/dL (ref 30.0–36.0)
MCV: 100.6 fL — ABNORMAL HIGH (ref 80.0–100.0)
Platelets: 156 10*3/uL (ref 150–400)
RBC: 3.12 MIL/uL — ABNORMAL LOW (ref 3.87–5.11)
RDW: 13.2 % (ref 11.5–15.5)
WBC: 10.4 10*3/uL (ref 4.0–10.5)
nRBC: 0 % (ref 0.0–0.2)

## 2021-01-11 MED ORDER — ASPIRIN 325 MG PO TBEC: 325.0000 mg | DELAYED_RELEASE_TABLET | Freq: Every day | ORAL | 0 refills | Status: DC

## 2021-01-11 MED ORDER — HYDROCODONE-ACETAMINOPHEN 5-325 MG PO TABS: 2.0000 | ORAL_TABLET | ORAL | 0 refills | Status: DC | PRN

## 2021-01-11 MED ORDER — METHOCARBAMOL 500 MG PO TABS: 500.0000 mg | ORAL_TABLET | Freq: Three times a day (TID) | ORAL | 1 refills | Status: DC | PRN

## 2021-01-11 NOTE — Progress Notes (Signed)
Physical Therapy Treatment Patient Details Name: Courtney Mcconnell MRN: [id number] DOB: 1955/03/10 Today's Date: 01/11/2021   History of Present Illness Pt is 65 yo female s/p R TKA on 01/09/21.  Pt with hx including but not limited to RA, OA, CAD, HLD, NSTEMI, seizure disorder, and sleep apnea.    PT Comments    Pt was seen for mobility on RW after declining to try stairs for home.  Reviewed HEP after gait, pt tolerating a fair amount of walking and then did simple isometric ex's.  Her plan is to go home with family, but MD is making decisions about her needs.  Follow along with her to encourage her to try stairs, and increase gait as she is willing to attempt.  Pt is in agreement with acute PT plan of care and goals for PT.   Recommendations for follow up therapy are one component of a multi-disciplinary discharge planning process, led by the attending physician.  Recommendations may be updated based on patient status, additional functional criteria and insurance authorization.  Follow Up Recommendations  Follow physician's recommendations for discharge plan and follow up therapies     Assistance Recommended at Discharge Frequent or constant Supervision/Assistance  Equipment Recommendations  Rolling walker (2 wheels)    Recommendations for Other Services       Precautions / Restrictions Precautions Precautions: Fall Precaution Comments: maintain balance with all gait and transfers Required Braces or Orthoses: Knee Immobilizer - Right Knee Immobilizer - Right: Other (comment);On except when in CPM Restrictions Weight Bearing Restrictions: Yes RLE Weight Bearing: Weight bearing as tolerated     Mobility  Bed Mobility Overal bed mobility: Needs Assistance Bed Mobility: Supine to Sit     Supine to sit: Min assist     General bed mobility comments: pt requires no help to move RLE, moved herself    Transfers Overall transfer level: Needs assistance Equipment used: Rolling  walker (2 wheels)   Sit to Stand: Min guard;Min assist           General transfer comment: power up to stand at walker with cues for hand placement and no signs of instability initially    Ambulation/Gait Ambulation/Gait assistance: Min guard Gait Distance (Feet): 60 Feet Assistive device: Rolling walker (2 wheels) Gait Pattern/deviations: Step-to pattern;Step-through pattern;Decreased stride length;Decreased weight shift to right Gait velocity: reduced Gait velocity interpretation: <1.31 ft/sec, indicative of household ambulator Pre-gait activities: wgt shift, brace management General Gait Details: slow progression but got to hall and back with close guard of chair by taking her time and working on Step through to steady more   [employer] Rankin (Stroke Patients Only)       Balance Overall balance assessment: Needs assistance Sitting-balance support: Feet supported;No upper extremity supported Sitting balance-Leahy Scale: Good     Standing balance support: Bilateral upper extremity supported;During functional activity Standing balance-Leahy Scale: Fair Standing balance comment: pt requires supervised to min guard assist to stand and steady herself                            Cognition Arousal/Alertness: Awake/alert Behavior During Therapy: WFL for tasks assessed/performed Overall Cognitive Status: Within Functional Limits for tasks assessed  Exercises Total Joint Exercises Ankle Circles/Pumps: AROM;5 reps Quad Sets: AROM;10 reps Gluteal Sets: AROM;10 reps    General Comments General comments (skin integrity, edema, etc.): pt has low endurance but can work with brace support to increase walking distance      Pertinent Vitals/Pain Pain Assessment: Faces Faces Pain Scale: Hurts little more Pain Location: R knee Pain Descriptors / Indicators:  Discomfort;Guarding Pain Intervention(s): Monitored during session;Premedicated before session;Repositioned    Home Living                          Prior Function            PT Goals (current goals can now be found in the care plan section) Acute Rehab PT Goals Patient Stated Goal: return home and go shopping Progress towards PT goals: Progressing toward goals    Frequency    7X/week      PT Plan Current plan remains appropriate    Co-evaluation              AM-PAC PT "6 Clicks" Mobility   Outcome Measure  Help needed turning from your back to your side while in a flat bed without using bedrails?: A Little Help needed moving from lying on your back to sitting on the side of a flat bed without using bedrails?: A Little Help needed moving to and from a bed to a chair (including a wheelchair)?: A Little Help needed standing up from a chair using your arms (e.g., wheelchair or bedside chair)?: A Little Help needed to walk in hospital room?: A Little Help needed climbing 3-5 steps with a railing? : A Little 6 Click Score: 18    End of Session Equipment Utilized During Treatment: Gait belt Activity Tolerance: Patient tolerated treatment well;Patient limited by fatigue Patient left: in chair;with call bell/phone within reach;with chair alarm set;with family/visitor present Nurse Communication: Mobility status PT Visit Diagnosis: Other abnormalities of gait and mobility (R26.89);Muscle weakness (generalized) (M62.81);Pain Pain - Right/Left: Right Pain - part of body: Knee     Time: 1108-1140 PT Time Calculation (min) (ACUTE ONLY): 32 min  Charges:  $Gait Training: 8-22 mins $Therapeutic Exercise: 8-22 mins       Ramond Dial 01/11/2021, 6:54 PM  Mee Hives, PT PhD Acute Rehab Dept. Number: [place]

## 2021-01-11 NOTE — Progress Notes (Signed)
Mobility Specialist Criteria Algorithm Info.  01/11/21 1536  Mobility  Activity Ambulated in room  Range of Motion/Exercises Active;All extremities  Level of Assistance Minimal assist, patient does 75% or more  Assistive Device Front wheel walker  RLE Weight Bearing WBAT  Distance Ambulated (ft) 40 ft  Mobility Ambulated with assistance in room  Mobility Response Tolerated well  Mobility performed by Mobility specialist  Bed Position Semi-fowlers    Patient received lying supine in bed eager to participate in mobility with anticipation to get discharged. Required min A to assist trunk to EOB from side lying to sitting. Min A to stand + cues for hand placement. Ambulated in room at min guard with steady gait. Returned back to bed without complaint or incident. Was left in bed with all needs met and family present.    01/11/2021 3:37 PM

## 2021-01-11 NOTE — Progress Notes (Signed)
Physical Therapy Treatment Patient Details Name: Courtney Mcconnell MRN: [id number] DOB: 1955-04-07 Today's Date: 01/11/2021   History of Present Illness Pt is 65 yo female s/p R TKA on 01/09/21.  Pt with hx including but not limited to RA, OA, CAD, HLD, NSTEMI, seizure disorder, and sleep apnea.    PT Comments    Pt agreed to stairs and was min guard with railing and R knee brace to complete the task.  Talked with family and pt again about using gait belt, and all voice understanding.  HEP handout was given to her to reinforce teaching of ex routine.  Pt is motivated to continue therapy and per her family is expecting MD to discharge her home.  Follow as her stay permits for gait and strengthening.   Recommendations for follow up therapy are one component of a multi-disciplinary discharge planning process, led by the attending physician.  Recommendations may be updated based on patient status, additional functional criteria and insurance authorization.  Follow Up Recommendations  Follow physician's recommendations for discharge plan and follow up therapies     Assistance Recommended at Discharge Frequent or constant Supervision/Assistance  Equipment Recommendations  Rolling walker (2 wheels)    Recommendations for Other Services       Precautions / Restrictions Precautions Precautions: Fall Precaution Comments: maintain balance with all gait and transfers Required Braces or Orthoses: Knee Immobilizer - Right Knee Immobilizer - Right: Other (comment);On except when in CPM Restrictions Weight Bearing Restrictions: Yes RLE Weight Bearing: Weight bearing as tolerated     Mobility  Bed Mobility Overal bed mobility: Needs Assistance Bed Mobility: Supine to Sit     Supine to sit: Min guard Sit to supine: Min guard   General bed mobility comments: pt using good mechanics to manage R knee brace    Transfers Overall transfer level: Needs assistance Equipment used: Rolling walker  (2 wheels) Transfers: Sit to/from Stand Sit to Stand: Min guard           General transfer comment: powering up with chair arms, better sequencing to stand at walker    Ambulation/Gait Ambulation/Gait assistance: Min guard Gait Distance (Feet): 20 Feet Assistive device: Rolling walker (2 wheels) Gait Pattern/deviations: Step-to pattern;Step-through pattern;Decreased stride length;Wide base of support Gait velocity: reduced Gait velocity interpretation: <1.31 ft/sec, indicative of household ambulator Pre-gait activities: wgt shift, management of brace General Gait Details: stairs with help and review of her sequence and safety   Stairs Stairs: Yes Stairs assistance: Min guard Stair Management: Two rails;Step to pattern;Forwards Number of Stairs: 10 General stair comments: pt was up and down flight x 2 with good response and able to follow instructions including quick transition to walker at the end   Wheelchair Mobility    Modified Rankin (Stroke Patients Only)       Balance Overall balance assessment: Needs assistance Sitting-balance support: Feet supported;No upper extremity supported Sitting balance-Leahy Scale: Good     Standing balance support: Bilateral upper extremity supported;During functional activity Standing balance-Leahy Scale: Fair Standing balance comment: pt requires supervised to min guard assist to stand and steady herself                            Cognition Arousal/Alertness: Awake/alert Behavior During Therapy: WFL for tasks assessed/performed Overall Cognitive Status: Within Functional Limits for tasks assessed  Exercises Total Joint Exercises Ankle Circles/Pumps: AROM;5 reps Quad Sets: AROM;10 reps Gluteal Sets: AROM;10 reps    General Comments General comments (skin integrity, edema, etc.): pt agreed to stair training today with reminders for brace sequence and good  follow through      Pertinent Vitals/Pain Pain Assessment: Faces Faces Pain Scale: Hurts a little bit Pain Location: R knee Pain Descriptors / Indicators: Guarding Pain Intervention(s): Monitored during session;Repositioned    Home Living                          Prior Function            PT Goals (current goals can now be found in the care plan section) Acute Rehab PT Goals Patient Stated Goal: return home and go shopping Progress towards PT goals: Progressing toward goals    Frequency    7X/week      PT Plan Current plan remains appropriate    Co-evaluation              AM-PAC PT "6 Clicks" Mobility   Outcome Measure  Help needed turning from your back to your side while in a flat bed without using bedrails?: A Little Help needed moving from lying on your back to sitting on the side of a flat bed without using bedrails?: A Little Help needed moving to and from a bed to a chair (including a wheelchair)?: A Little Help needed standing up from a chair using your arms (e.g., wheelchair or bedside chair)?: A Little Help needed to walk in hospital room?: A Little Help needed climbing 3-5 steps with a railing? : A Little 6 Click Score: 18    End of Session Equipment Utilized During Treatment: Gait belt Activity Tolerance: Patient tolerated treatment well;Patient limited by fatigue Patient left: in chair;with call bell/phone within reach;with chair alarm set;with family/visitor present Nurse Communication: Mobility status PT Visit Diagnosis: Other abnormalities of gait and mobility (R26.89);Muscle weakness (generalized) (M62.81);Pain Pain - Right/Left: Right Pain - part of body: Knee     Time: 4665-9935 PT Time Calculation (min) (ACUTE ONLY): 24 min  Charges:  $Gait Training: 8-22 mins $Therapeutic Exercise: 8-22 mins $Therapeutic Activity: 8-22 mins            Ramond Dial 01/11/2021, 7:08 PM  Mee Hives, PT PhD Acute Rehab Dept. Number:  [place] and [place]

## 2021-01-12 ENCOUNTER — Telehealth: Payer: Self-pay | Admitting: *Deleted

## 2021-01-12 NOTE — Telephone Encounter (Signed)
Ortho bundle D/C call attempted. No answer and left VM requesting call back. 

## 2021-01-13 ENCOUNTER — Telehealth: Payer: Self-pay | Admitting: *Deleted

## 2021-01-13 DIAGNOSIS — I10 Essential (primary) hypertension: Secondary | ICD-10-CM | POA: Diagnosis not present

## 2021-01-13 DIAGNOSIS — Z8673 Personal history of transient ischemic attack (TIA), and cerebral infarction without residual deficits: Secondary | ICD-10-CM | POA: Diagnosis not present

## 2021-01-13 DIAGNOSIS — G4730 Sleep apnea, unspecified: Secondary | ICD-10-CM | POA: Diagnosis not present

## 2021-01-13 DIAGNOSIS — I252 Old myocardial infarction: Secondary | ICD-10-CM | POA: Diagnosis not present

## 2021-01-13 DIAGNOSIS — M0579 Rheumatoid arthritis with rheumatoid factor of multiple sites without organ or systems involvement: Secondary | ICD-10-CM | POA: Diagnosis not present

## 2021-01-13 DIAGNOSIS — R918 Other nonspecific abnormal finding of lung field: Secondary | ICD-10-CM | POA: Diagnosis not present

## 2021-01-13 DIAGNOSIS — Z471 Aftercare following joint replacement surgery: Secondary | ICD-10-CM | POA: Diagnosis not present

## 2021-01-13 DIAGNOSIS — J439 Emphysema, unspecified: Secondary | ICD-10-CM | POA: Diagnosis not present

## 2021-01-13 DIAGNOSIS — Z87891 Personal history of nicotine dependence: Secondary | ICD-10-CM | POA: Diagnosis not present

## 2021-01-13 DIAGNOSIS — G40009 Localization-related (focal) (partial) idiopathic epilepsy and epileptic syndromes with seizures of localized onset, not intractable, without status epilepticus: Secondary | ICD-10-CM | POA: Diagnosis not present

## 2021-01-13 DIAGNOSIS — Z96651 Presence of right artificial knee joint: Secondary | ICD-10-CM | POA: Diagnosis not present

## 2021-01-13 DIAGNOSIS — I25119 Atherosclerotic heart disease of native coronary artery with unspecified angina pectoris: Secondary | ICD-10-CM | POA: Diagnosis not present

## 2021-01-13 DIAGNOSIS — M1990 Unspecified osteoarthritis, unspecified site: Secondary | ICD-10-CM | POA: Diagnosis not present

## 2021-01-13 DIAGNOSIS — Z7982 Long term (current) use of aspirin: Secondary | ICD-10-CM | POA: Diagnosis not present

## 2021-01-13 DIAGNOSIS — E785 Hyperlipidemia, unspecified: Secondary | ICD-10-CM | POA: Diagnosis not present

## 2021-01-13 NOTE — Telephone Encounter (Signed)
2nd attempt at discharge call to patient. No answer and left VM requesting call back.

## 2021-01-14 DIAGNOSIS — E785 Hyperlipidemia, unspecified: Secondary | ICD-10-CM | POA: Diagnosis not present

## 2021-01-14 DIAGNOSIS — M1990 Unspecified osteoarthritis, unspecified site: Secondary | ICD-10-CM | POA: Diagnosis not present

## 2021-01-14 DIAGNOSIS — Z471 Aftercare following joint replacement surgery: Secondary | ICD-10-CM | POA: Diagnosis not present

## 2021-01-14 DIAGNOSIS — I25119 Atherosclerotic heart disease of native coronary artery with unspecified angina pectoris: Secondary | ICD-10-CM | POA: Diagnosis not present

## 2021-01-14 DIAGNOSIS — I10 Essential (primary) hypertension: Secondary | ICD-10-CM | POA: Diagnosis not present

## 2021-01-14 DIAGNOSIS — G40009 Localization-related (focal) (partial) idiopathic epilepsy and epileptic syndromes with seizures of localized onset, not intractable, without status epilepticus: Secondary | ICD-10-CM | POA: Diagnosis not present

## 2021-01-15 DIAGNOSIS — E785 Hyperlipidemia, unspecified: Secondary | ICD-10-CM | POA: Diagnosis not present

## 2021-01-15 DIAGNOSIS — G40009 Localization-related (focal) (partial) idiopathic epilepsy and epileptic syndromes with seizures of localized onset, not intractable, without status epilepticus: Secondary | ICD-10-CM | POA: Diagnosis not present

## 2021-01-15 DIAGNOSIS — I10 Essential (primary) hypertension: Secondary | ICD-10-CM | POA: Diagnosis not present

## 2021-01-15 DIAGNOSIS — Z471 Aftercare following joint replacement surgery: Secondary | ICD-10-CM | POA: Diagnosis not present

## 2021-01-15 DIAGNOSIS — I25119 Atherosclerotic heart disease of native coronary artery with unspecified angina pectoris: Secondary | ICD-10-CM | POA: Diagnosis not present

## 2021-01-15 DIAGNOSIS — M1990 Unspecified osteoarthritis, unspecified site: Secondary | ICD-10-CM | POA: Diagnosis not present

## 2021-01-16 ENCOUNTER — Telehealth: Payer: Self-pay | Admitting: *Deleted

## 2021-01-16 DIAGNOSIS — E785 Hyperlipidemia, unspecified: Secondary | ICD-10-CM | POA: Diagnosis not present

## 2021-01-16 DIAGNOSIS — I10 Essential (primary) hypertension: Secondary | ICD-10-CM | POA: Diagnosis not present

## 2021-01-16 DIAGNOSIS — M1990 Unspecified osteoarthritis, unspecified site: Secondary | ICD-10-CM | POA: Diagnosis not present

## 2021-01-16 DIAGNOSIS — I25119 Atherosclerotic heart disease of native coronary artery with unspecified angina pectoris: Secondary | ICD-10-CM | POA: Diagnosis not present

## 2021-01-16 DIAGNOSIS — G40009 Localization-related (focal) (partial) idiopathic epilepsy and epileptic syndromes with seizures of localized onset, not intractable, without status epilepticus: Secondary | ICD-10-CM | POA: Diagnosis not present

## 2021-01-16 DIAGNOSIS — Z471 Aftercare following joint replacement surgery: Secondary | ICD-10-CM | POA: Diagnosis not present

## 2021-01-16 NOTE — Telephone Encounter (Signed)
Ortho bundle 7 day call completed. 

## 2021-01-18 DIAGNOSIS — E785 Hyperlipidemia, unspecified: Secondary | ICD-10-CM | POA: Diagnosis not present

## 2021-01-18 DIAGNOSIS — M1990 Unspecified osteoarthritis, unspecified site: Secondary | ICD-10-CM | POA: Diagnosis not present

## 2021-01-18 DIAGNOSIS — G40009 Localization-related (focal) (partial) idiopathic epilepsy and epileptic syndromes with seizures of localized onset, not intractable, without status epilepticus: Secondary | ICD-10-CM | POA: Diagnosis not present

## 2021-01-18 DIAGNOSIS — Z471 Aftercare following joint replacement surgery: Secondary | ICD-10-CM | POA: Diagnosis not present

## 2021-01-18 DIAGNOSIS — I25119 Atherosclerotic heart disease of native coronary artery with unspecified angina pectoris: Secondary | ICD-10-CM | POA: Diagnosis not present

## 2021-01-18 DIAGNOSIS — I10 Essential (primary) hypertension: Secondary | ICD-10-CM | POA: Diagnosis not present

## 2021-01-19 ENCOUNTER — Ambulatory Visit: Payer: Self-pay

## 2021-01-19 ENCOUNTER — Telehealth: Payer: Self-pay

## 2021-01-19 ENCOUNTER — Ambulatory Visit (INDEPENDENT_AMBULATORY_CARE_PROVIDER_SITE_OTHER): Payer: Medicare Other | Admitting: Orthopaedic Surgery

## 2021-01-19 ENCOUNTER — Encounter: Payer: Self-pay | Admitting: Orthopaedic Surgery

## 2021-01-19 ENCOUNTER — Other Ambulatory Visit: Payer: Self-pay

## 2021-01-19 VITALS — Ht 66.0 in | Wt 240.0 lb

## 2021-01-19 DIAGNOSIS — I824Z1 Acute embolism and thrombosis of unspecified deep veins of right distal lower extremity: Secondary | ICD-10-CM

## 2021-01-19 DIAGNOSIS — M7989 Other specified soft tissue disorders: Secondary | ICD-10-CM

## 2021-01-19 DIAGNOSIS — M79604 Pain in right leg: Secondary | ICD-10-CM | POA: Diagnosis not present

## 2021-01-19 DIAGNOSIS — Z86718 Personal history of other venous thrombosis and embolism: Secondary | ICD-10-CM | POA: Insufficient documentation

## 2021-01-19 DIAGNOSIS — I82461 Acute embolism and thrombosis of right calf muscular vein: Secondary | ICD-10-CM | POA: Diagnosis not present

## 2021-01-19 DIAGNOSIS — I82409 Acute embolism and thrombosis of unspecified deep veins of unspecified lower extremity: Secondary | ICD-10-CM | POA: Insufficient documentation

## 2021-01-19 DIAGNOSIS — I82451 Acute embolism and thrombosis of right peroneal vein: Secondary | ICD-10-CM | POA: Diagnosis not present

## 2021-01-19 DIAGNOSIS — I82811 Embolism and thrombosis of superficial veins of right lower extremities: Secondary | ICD-10-CM | POA: Diagnosis not present

## 2021-01-19 DIAGNOSIS — I82441 Acute embolism and thrombosis of right tibial vein: Secondary | ICD-10-CM | POA: Diagnosis not present

## 2021-01-19 DIAGNOSIS — Z96651 Presence of right artificial knee joint: Secondary | ICD-10-CM

## 2021-01-19 MED ORDER — XARELTO VTE STARTER PACK 15 & 20 MG PO TBPK: ORAL_TABLET | ORAL | 0 refills | Status: DC

## 2021-01-19 NOTE — Addendum Note (Signed)
Addended by: [name] on: 01/19/2021 12:37 PM   Modules accepted: Orders

## 2021-01-19 NOTE — Addendum Note (Signed)
Addended by: Meyer Cory on: 01/19/2021 10:10 AM   Modules accepted: Orders

## 2021-01-19 NOTE — Telephone Encounter (Signed)
Dr. Lorin Mercy spoke with patient's daughter to advise.

## 2021-01-19 NOTE — Progress Notes (Addendum)
Post-Op Visit Note   Patient: Courtney Mcconnell           Date of Birth: 01/14/1956           MRN: [id number] Visit Date: 01/19/2021 PCP: Dettinger, Fransisca Kaufmann, MD   Assessment & Plan: Post total knee arthroplasty right leg.  Good position on x-rays.  She has significant swelling old lumbar injury to medial ankle with damage saphenous vein.  She has pitting edema.  She has 10 degree extension lag.  We will set up outpatient therapy starting next week she is staying here in town after daughter.  She will call if she needs more pain medication.  Teds prescription given for surgical stockings she can get at [facility].  Return 4 weeks.  Chief Complaint:  Chief Complaint  Patient presents with   Right Knee - Routine Post Op    01/09/2021 right TKA   Visit Diagnoses:  1. Status post total right knee replacement     Plan: Teds for swelling.  Doppler test to rule out DVT.  Transition outpatient therapy.  She is to have the surgical result x-rays look good.  Follow-Up Instructions: No follow-ups on file.   Orders:  Orders Placed This Encounter  Procedures   XR Knee 1-2 Views Right   No orders of the defined types were placed in this encounter.   Imaging: No results found.  PMFS History: Patient Active Problem List   Diagnosis Date Noted   S/P TKR (total knee replacement), right 01/09/2021   Rheumatoid arthritis with rheumatoid factor of multiple sites without organ or systems involvement ([place]) 12/02/2020   Unilateral primary osteoarthritis, right knee 10/05/2020   Coronary artery disease involving native coronary artery of native heart with angina pectoris ([place]) 05/22/2019   Hyperlipidemia with target LDL less than 70 -> CAD 02/12/2019   Presence of drug coated stent in LAD coronary artery 02/11/2019   History of non-ST elevation myocardial infarction (NSTEMI) 02/10/2019   Cerebral infarction, remote, resolved 06/27/2018   Pulmonary nodules 12/08/2015   Sleep apnea 04/06/2015    Pulmonary emphysema ([place]) 03/02/2015   Smoker 02/16/2015   Seizure disorder ([place]) 01/21/2015   Focal epilepsy with impairment of consciousness ([place]) 12/13/2014   Essential hypertension 04/18/2014   Preretinal fibrosis, 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   Tobacco use disorder 07/19/2012   Past Medical History:  Diagnosis Date   Asthma    Bursitis    Hip   CAD (coronary artery disease) 02/11/2019   PCI/DES x1 to mLAD, focal ostial 70% lesion in 1st diag   COPD (chronic obstructive pulmonary disease) ([place])    Essential hypertension    GERD (gastroesophageal reflux disease)    Headache(784.0)    Hyperlipidemia    NSTEMI (non-ST elevated myocardial infarction) ([place]) 02/11/2019   Pneumonia    Seizure ([place])    Sleep apnea     Family History  Problem Relation Age of Onset   COPD Mother    Diabetes Father    Hypertension Father    Hyperlipidemia Father    Cancer Sister        Bone Cancer   Diabetes Brother    Cancer Brother    Cancer Paternal Uncle    Stroke Paternal Grandmother    Healthy Daughter    Healthy Son    Colon cancer Neg Hx     Past Surgical History:  Procedure Laterality Date   ABDOMINAL HYSTERECTOMY     APPENDECTOMY     CATARACT EXTRACTION Bilateral  CHOLECYSTECTOMY     CORONARY STENT INTERVENTION N/A 02/11/2019   Procedure: CORONARY STENT INTERVENTION;  Surgeon: Burnell Blanks, MD;  Location: [facility];; mid LAD 70% -- DES PCI (Synergy DES 2.5 x 28 --2.8 mm)   heal spur     right   INTRAVASCULAR PRESSURE WIRE/FFR STUDY N/A 02/11/2019   Procedure: INTRAVASCULAR PRESSURE WIRE/FFR STUDY;  Surgeon: Burnell Blanks, MD;  Location: [facility];  Service: Cardiovascular;  Laterality: N/A;   LASER PHOTO ABLATION Right 08/27/2013   Procedure: LASER PHOTO ABLATION;  Surgeon: Hayden Pedro, MD;  Location: [facility];  Service: Ophthalmology;  Laterality: Right;  Headscope laser AND Endolaser   LEFT HEART CATH AND CORONARY ANGIOGRAPHY N/A 02/11/2019    Procedure: LEFT HEART CATH AND CORONARY ANGIOGRAPHY;  Surgeon: Burnell Blanks, MD;  Location: [facility];;; prox RCA 20%. Ost-prox Cx 20%. ostial D1 70% & mLAD 70% => (DES PCI of LAD)   MEMBRANE PEEL Right 08/27/2013   Procedure: MEMBRANE PEEL;  Surgeon: Hayden Pedro, MD;  Location: [facility];  Service: Ophthalmology;  Laterality: Right;   PARS PLANA VITRECTOMY Right 08/27/2013   Procedure: PARS PLANA VITRECTOMY WITH 25 GAUGE;  Surgeon: Hayden Pedro, MD;  Location: [facility];  Service: Ophthalmology;  Laterality: Right;   TOTAL KNEE ARTHROPLASTY Right 01/09/2021   Procedure: RIGHT TOTAL KNEE ARTHROPLASTY;  Surgeon: [name], MD;  Location: [facility];  Service: Orthopedics;  Laterality: Right;  Needs RNFA   Social History   Occupational History   Occupation: COOK    Employer: [employer]  Tobacco Use   Smoking status: Former    Packs/day: 0.25    Years: 42.00    Pack years: 10.50    Types: Cigarettes    Quit date: 03/20/2014    Years since quitting: 6.8   Smokeless tobacco: Never   Tobacco comments:    01/21/15 restarted smoking 4 mos ago  Vaping Use   Vaping Use: Never used  Substance and Sexual Activity   Alcohol use: No    Alcohol/week: 0.0 standard drinks   Drug use: No   Sexual activity: Not on file   Addendum.  Vascular called Doppler test positive for DVT posterior tib, popliteal gastroc vein all below the knee.  We will start her on a Xarelto starter pack.  TED hose as prescribed above.Results called to daughter. Rx sent to [facility] drug.

## 2021-01-19 NOTE — Telephone Encounter (Signed)
Answering service called states Courtney Mcconnell from [facility] called and would like to speak to Dr. Lorin Mercy ASAP. +DVT.     CB: [phone]

## 2021-01-20 ENCOUNTER — Telehealth: Payer: Self-pay | Admitting: *Deleted

## 2021-01-20 DIAGNOSIS — M25661 Stiffness of right knee, not elsewhere classified: Secondary | ICD-10-CM | POA: Diagnosis not present

## 2021-01-20 DIAGNOSIS — R262 Difficulty in walking, not elsewhere classified: Secondary | ICD-10-CM | POA: Diagnosis not present

## 2021-01-20 DIAGNOSIS — M25561 Pain in right knee: Secondary | ICD-10-CM | POA: Diagnosis not present

## 2021-01-20 DIAGNOSIS — Z789 Other specified health status: Secondary | ICD-10-CM | POA: Diagnosis not present

## 2021-01-20 DIAGNOSIS — Z96651 Presence of right artificial knee joint: Secondary | ICD-10-CM | POA: Diagnosis not present

## 2021-01-20 DIAGNOSIS — M6281 Muscle weakness (generalized): Secondary | ICD-10-CM | POA: Diagnosis not present

## 2021-01-20 DIAGNOSIS — R29898 Other symptoms and signs involving the musculoskeletal system: Secondary | ICD-10-CM | POA: Diagnosis not present

## 2021-01-20 DIAGNOSIS — Z7409 Other reduced mobility: Secondary | ICD-10-CM | POA: Diagnosis not present

## 2021-01-20 NOTE — Care Plan (Signed)
Received call last night from HHPT stating they reached out to patient today to discuss their last appointment tomorrow for D/C. Patient states she went to her post op appointment today with Dr. Lorin Mercy in [place] and is positive for a DVT. She is scheduled for OPPT there at [facility] and doesn't know whether she should start with a blood clot. Spoke with MD who confirmed DVT as well as OPPT scheduled and confirmed she should attend therapy as ordered. Updated HHPT to go ahead and discharge patient as she is scheduled to begin OPPT tomorrow. Updated daughter today that CM did speak with MD and ok to proceed with OPPT. Discussed with daughter as well about if she got compression hose from pharmacy earlier in the week as discussed. She states she works at [employer] and had someone there fit her mother for these, but when bought and got them home, they did not fit. Going today to [facility] [employer] [facility] for fitting and picking up additional pair. Xarelto started last night per daughter. OPPT today.

## 2021-01-20 NOTE — Telephone Encounter (Signed)
Ortho bundle 14 day call to patient.

## 2021-01-23 DIAGNOSIS — Z789 Other specified health status: Secondary | ICD-10-CM | POA: Diagnosis not present

## 2021-01-23 DIAGNOSIS — Z96651 Presence of right artificial knee joint: Secondary | ICD-10-CM | POA: Diagnosis not present

## 2021-01-23 DIAGNOSIS — R29898 Other symptoms and signs involving the musculoskeletal system: Secondary | ICD-10-CM | POA: Diagnosis not present

## 2021-01-23 DIAGNOSIS — Z20828 Contact with and (suspected) exposure to other viral communicable diseases: Secondary | ICD-10-CM | POA: Diagnosis not present

## 2021-01-23 DIAGNOSIS — M25661 Stiffness of right knee, not elsewhere classified: Secondary | ICD-10-CM | POA: Diagnosis not present

## 2021-01-23 DIAGNOSIS — M25561 Pain in right knee: Secondary | ICD-10-CM | POA: Diagnosis not present

## 2021-01-23 DIAGNOSIS — R262 Difficulty in walking, not elsewhere classified: Secondary | ICD-10-CM | POA: Diagnosis not present

## 2021-01-25 DIAGNOSIS — M25561 Pain in right knee: Secondary | ICD-10-CM | POA: Diagnosis not present

## 2021-01-25 DIAGNOSIS — R29898 Other symptoms and signs involving the musculoskeletal system: Secondary | ICD-10-CM | POA: Diagnosis not present

## 2021-01-25 DIAGNOSIS — Z96651 Presence of right artificial knee joint: Secondary | ICD-10-CM | POA: Diagnosis not present

## 2021-01-25 DIAGNOSIS — R262 Difficulty in walking, not elsewhere classified: Secondary | ICD-10-CM | POA: Diagnosis not present

## 2021-01-25 DIAGNOSIS — M25661 Stiffness of right knee, not elsewhere classified: Secondary | ICD-10-CM | POA: Diagnosis not present

## 2021-01-25 DIAGNOSIS — Z789 Other specified health status: Secondary | ICD-10-CM | POA: Diagnosis not present

## 2021-01-26 ENCOUNTER — Telehealth: Payer: Self-pay | Admitting: *Deleted

## 2021-01-26 NOTE — Telephone Encounter (Signed)
Ortho bundle call to patient to check status completed.

## 2021-01-27 ENCOUNTER — Ambulatory Visit (HOSPITAL_COMMUNITY): Payer: Medicare Other

## 2021-01-27 ENCOUNTER — Ambulatory Visit: Payer: Medicare Other | Admitting: Family Medicine

## 2021-01-27 DIAGNOSIS — R262 Difficulty in walking, not elsewhere classified: Secondary | ICD-10-CM | POA: Diagnosis not present

## 2021-01-27 DIAGNOSIS — R29898 Other symptoms and signs involving the musculoskeletal system: Secondary | ICD-10-CM | POA: Diagnosis not present

## 2021-01-27 DIAGNOSIS — Z96651 Presence of right artificial knee joint: Secondary | ICD-10-CM | POA: Diagnosis not present

## 2021-01-27 DIAGNOSIS — Z789 Other specified health status: Secondary | ICD-10-CM | POA: Diagnosis not present

## 2021-01-27 DIAGNOSIS — M25561 Pain in right knee: Secondary | ICD-10-CM | POA: Diagnosis not present

## 2021-01-27 DIAGNOSIS — M25661 Stiffness of right knee, not elsewhere classified: Secondary | ICD-10-CM | POA: Diagnosis not present

## 2021-01-28 ENCOUNTER — Other Ambulatory Visit: Payer: Self-pay | Admitting: Orthopaedic Surgery

## 2021-01-30 ENCOUNTER — Other Ambulatory Visit: Payer: Self-pay | Admitting: Orthopaedic Surgery

## 2021-01-30 DIAGNOSIS — Z789 Other specified health status: Secondary | ICD-10-CM | POA: Diagnosis not present

## 2021-01-30 DIAGNOSIS — M25561 Pain in right knee: Secondary | ICD-10-CM | POA: Diagnosis not present

## 2021-01-30 DIAGNOSIS — R29898 Other symptoms and signs involving the musculoskeletal system: Secondary | ICD-10-CM | POA: Diagnosis not present

## 2021-01-30 DIAGNOSIS — M25661 Stiffness of right knee, not elsewhere classified: Secondary | ICD-10-CM | POA: Diagnosis not present

## 2021-01-30 DIAGNOSIS — R262 Difficulty in walking, not elsewhere classified: Secondary | ICD-10-CM | POA: Diagnosis not present

## 2021-01-30 DIAGNOSIS — Z96651 Presence of right artificial knee joint: Secondary | ICD-10-CM | POA: Diagnosis not present

## 2021-02-01 DIAGNOSIS — Z96651 Presence of right artificial knee joint: Secondary | ICD-10-CM | POA: Diagnosis not present

## 2021-02-01 DIAGNOSIS — M25661 Stiffness of right knee, not elsewhere classified: Secondary | ICD-10-CM | POA: Diagnosis not present

## 2021-02-01 DIAGNOSIS — Z789 Other specified health status: Secondary | ICD-10-CM | POA: Diagnosis not present

## 2021-02-01 DIAGNOSIS — M25561 Pain in right knee: Secondary | ICD-10-CM | POA: Diagnosis not present

## 2021-02-01 DIAGNOSIS — R29898 Other symptoms and signs involving the musculoskeletal system: Secondary | ICD-10-CM | POA: Diagnosis not present

## 2021-02-01 DIAGNOSIS — R262 Difficulty in walking, not elsewhere classified: Secondary | ICD-10-CM | POA: Diagnosis not present

## 2021-02-01 NOTE — Discharge Summary (Signed)
Patient ID: Courtney Mcconnell MRN: [id number] DOB/AGE: 65/17/1957 65 y.o.  Admit date: 01/09/2021 Discharge date: 01/11/2021  Admission Diagnoses:  Principal Problem:   S/P TKR (total knee replacement), right Active Problems:   Unilateral primary osteoarthritis, right knee   Discharge Diagnoses:  Principal Problem:   S/P TKR (total knee replacement), right Active Problems:   Unilateral primary osteoarthritis, right knee  status post Procedure(s): RIGHT TOTAL KNEE ARTHROPLASTY  Past Medical History:  Diagnosis Date   Asthma    Bursitis    Hip   CAD (coronary artery disease) 02/11/2019   PCI/DES x1 to mLAD, focal ostial 70% lesion in 1st diag   COPD (chronic obstructive pulmonary disease) ([facility])    Essential hypertension    GERD (gastroesophageal reflux disease)    Headache(784.0)    Hyperlipidemia    NSTEMI (non-ST elevated myocardial infarction) ([place]) 02/11/2019   Pneumonia    Seizure ([place])    Sleep apnea     Surgeries: Procedure(s): RIGHT TOTAL KNEE ARTHROPLASTY on 01/09/2021   Consultants:   Discharged Condition: Improved  Hospital Course: Courtney Mcconnell is an 65 y.o. female who was admitted 01/09/2021 for operative treatment of knee djd. Patient failed conservative treatments (please see the history and physical for the specifics) and had severe unremitting pain that affects sleep, daily activities and work/hobbies. After pre-op clearance, the patient was taken to the operating room on 01/09/2021 and underwent  Procedure(s): RIGHT TOTAL KNEE ARTHROPLASTY.    Patient was given perioperative antibiotics:  Anti-infectives (From admission, onward)    Start     Dose/Rate Route Frequency Ordered Stop   01/09/21 1100  vancomycin (VANCOCIN) IVPB 1000 mg/200 mL premix        1,000 mg 200 mL/hr over 60 Minutes Intravenous On call to O.R. 01/09/21 1056 01/09/21 1641        Patient was given sequential compression devices and early ambulation to prevent DVT.    Patient benefited maximally from hospital stay and there were no complications. At the time of discharge, the patient was urinating/moving their bowels without difficulty, tolerating a regular diet, pain is controlled with oral pain medications and they have been cleared by PT/OT.   Recent vital signs: No data found.   Recent laboratory studies: No results for input(s): WBC, HGB, HCT, PLT, NA, K, CL, CO2, BUN, CREATININE, GLUCOSE, INR, CALCIUM in the last 72 hours.  Invalid input(s): PT, 2   Discharge Medications:   Allergies as of 01/11/2021       Reactions   Penicillins Hives   Bee Venom Swelling   Prednisone Other (See Comments)   Patient did not like how it made her feel, caused jittery feeling.    Amoxicillin-pot Clavulanate Rash   Doxycycline Hyclate Itching, Rash        Medication List     STOP taking these medications    acetaminophen 650 MG CR tablet Commonly known as: TYLENOL   cefdinir 300 MG capsule Commonly known as: OMNICEF       TAKE these medications    Advair Diskus 100-50 MCG/ACT Aepb Generic drug: fluticasone-salmeterol inhale ONE PUFF into THE lungs IN THE MORNING AND IN THE EVENING What changed: See the new instructions.   albuterol 108 (90 Base) MCG/ACT inhaler Commonly known as: Ventolin HFA INHALE TWO PUFFS INTO THE LUNGS EVERY 6 HOURS AS NEEDED FOR WHEEZING OR SHORTNESS OF BREATH   aspirin 325 MG EC tablet Take 1 tablet (325 mg total) by mouth daily with breakfast. What  changed:  medication strength how much to take when to take this   atorvastatin 40 MG tablet Commonly known as: LIPITOR TAKE 1 TABLET BY MOUTH DAILY AT 6PM   benzonatate 100 MG capsule Commonly known as: Tessalon Perles Take 1 capsule (100 mg total) by mouth 3 (three) times daily as needed.   carvedilol 3.125 MG tablet Commonly known as: COREG TAKE 1 TABLET BY MOUTH TWICE DAILY WITH A MEAL   diclofenac Sodium 1 % Gel Commonly known as: Voltaren Apply 2 g  topically 4 (four) times daily. What changed:  when to take this reasons to take this   folic acid 1 MG tablet Commonly known as: FOLVITE Take 1 tablet (1 mg total) by mouth daily.   losartan 25 MG tablet Commonly known as: COZAAR TAKE 1 TABLET BY MOUTH EVERY DAY   meloxicam 15 MG tablet Commonly known as: MOBIC Take 1 tablet (15 mg total) by mouth daily. What changed:  when to take this reasons to take this   nitroGLYCERIN 0.4 MG SL tablet Commonly known as: NITROSTAT Place 1 tablet (0.4 mg total) under the tongue every 5 (five) minutes as needed for chest pain.   omeprazole 20 MG tablet Commonly known as: PRILOSEC OTC Take 20 mg by mouth 2 (two) times daily.   Spiriva HandiHaler 18 MCG inhalation capsule Generic drug: tiotropium INHALE 1 PUFF BY MOUTH DAILY   Vimpat 150 MG Tabs Generic drug: Lacosamide Take 300 mg by mouth 2 (two) times daily.   zonisamide 100 MG capsule Commonly known as: ZONEGRAN Take 200 mg by mouth at bedtime.        Diagnostic Studies: DG Chest 2 View  Result Date: 01/03/2021 CLINICAL DATA:  Preoperative evaluation for knee replacement surgery EXAM: CHEST - 2 VIEW COMPARISON:  09/27/2020 FINDINGS: The heart size and mediastinal contours are within normal limits. Both lungs are clear. The visualized skeletal structures are unremarkable. IMPRESSION: No active cardiopulmonary disease. Electronically Signed   By: Randa Ngo M.D.   On: 01/03/2021 15:25       Follow-up Information     [name], MD Follow up in 1 week(s).   Specialty: Orthopedic Surgery Contact information: [address] [place] [address] [phone]                 Discharge Plan:  discharge to home  Disposition:     Signed: Benjiman Core  02/01/2021, 7:40 AM

## 2021-02-03 DIAGNOSIS — Z789 Other specified health status: Secondary | ICD-10-CM | POA: Diagnosis not present

## 2021-02-03 DIAGNOSIS — M25661 Stiffness of right knee, not elsewhere classified: Secondary | ICD-10-CM | POA: Diagnosis not present

## 2021-02-03 DIAGNOSIS — R29898 Other symptoms and signs involving the musculoskeletal system: Secondary | ICD-10-CM | POA: Diagnosis not present

## 2021-02-03 DIAGNOSIS — M25561 Pain in right knee: Secondary | ICD-10-CM | POA: Diagnosis not present

## 2021-02-03 DIAGNOSIS — Z96651 Presence of right artificial knee joint: Secondary | ICD-10-CM | POA: Diagnosis not present

## 2021-02-03 DIAGNOSIS — R262 Difficulty in walking, not elsewhere classified: Secondary | ICD-10-CM | POA: Diagnosis not present

## 2021-02-07 NOTE — Progress Notes (Deleted)
Office Visit Note  Patient: Courtney Mcconnell             Date of Birth: 1955-04-29           MRN: [id number]             PCP: Dettinger, Fransisca Kaufmann, MD Referring: Dettinger, Fransisca Kaufmann, MD Visit Date: 02/21/2021 Occupation: '@GUAROCC' @  Subjective:  No chief complaint on file.   History of Present Illness: Courtney Mcconnell is a 65 y.o. female ***   Activities of Daily Living:  Patient reports morning stiffness for *** {minute/hour:19697}.   Patient {ACTIONS;DENIES/REPORTS:21021675::"Denies"} nocturnal pain.  Difficulty dressing/grooming: {ACTIONS;DENIES/REPORTS:21021675::"Denies"} Difficulty climbing stairs: {ACTIONS;DENIES/REPORTS:21021675::"Denies"} Difficulty getting out of chair: {ACTIONS;DENIES/REPORTS:21021675::"Denies"} Difficulty using hands for taps, buttons, cutlery, and/or writing: {ACTIONS;DENIES/REPORTS:21021675::"Denies"}  No Rheumatology ROS completed.   PMFS History:  Patient Active Problem List   Diagnosis Date Noted   DVT (deep venous thrombosis) ([place]) 01/19/2021   S/P TKR (total knee replacement), right 01/09/2021   Rheumatoid arthritis with rheumatoid factor of multiple sites without organ or systems involvement ([place]) 12/02/2020   Unilateral primary osteoarthritis, right knee 10/05/2020   Coronary artery disease involving native coronary artery of native heart with angina pectoris ([place]) 05/22/2019   Hyperlipidemia with target LDL less than 70 -> CAD 02/12/2019   Presence of drug coated stent in LAD coronary artery 02/11/2019   History of non-ST elevation myocardial infarction (NSTEMI) 02/10/2019   Cerebral infarction, remote, resolved 06/27/2018   Pulmonary nodules 12/08/2015   Sleep apnea 04/06/2015   Pulmonary emphysema ([place]) 03/02/2015   Smoker 02/16/2015   Seizure disorder ([place]) 01/21/2015   Focal epilepsy with impairment of consciousness ([place]) 12/13/2014   Essential hypertension 04/18/2014   Preretinal fibrosis, 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   Tobacco use  disorder 07/19/2012    Past Medical History:  Diagnosis Date   Asthma    Bursitis    Hip   CAD (coronary artery disease) 02/11/2019   PCI/DES x1 to mLAD, focal ostial 70% lesion in 1st diag   COPD (chronic obstructive pulmonary disease) ([place])    Essential hypertension    GERD (gastroesophageal reflux disease)    Headache(784.0)    Hyperlipidemia    NSTEMI (non-ST elevated myocardial infarction) ([place]) 02/11/2019   Pneumonia    Seizure ([place])    Sleep apnea     Family History  Problem Relation Age of Onset   COPD Mother    Diabetes Father    Hypertension Father    Hyperlipidemia Father    Cancer Sister        Bone Cancer   Diabetes Brother    Cancer Brother    Cancer Paternal Uncle    Stroke Paternal Grandmother    Healthy Daughter    Healthy Son    Colon cancer Neg Hx    Past Surgical History:  Procedure Laterality Date   ABDOMINAL HYSTERECTOMY     APPENDECTOMY     CATARACT EXTRACTION Bilateral    CHOLECYSTECTOMY     CORONARY STENT INTERVENTION N/A 02/11/2019   Procedure: CORONARY STENT INTERVENTION;  Surgeon: Burnell Blanks, MD;  [facility];; mid LAD 70% -- DES PCI (Synergy DES 2.5 x 28 --2.8 mm)   heal spur     right   INTRAVASCULAR PRESSURE WIRE/FFR STUDY N/A 02/11/2019   Procedure: INTRAVASCULAR PRESSURE WIRE/FFR STUDY;  Surgeon: Burnell Blanks, MD;  Location: [facility];  Service: Cardiovascular;  Laterality: N/A;   LASER PHOTO ABLATION Right 08/27/2013  Procedure: LASER PHOTO ABLATION;  Surgeon: Hayden Pedro, MD;  Location: [facility];  Service: Ophthalmology;  Laterality: Right;  Headscope laser AND Endolaser   LEFT HEART CATH AND CORONARY ANGIOGRAPHY N/A 02/11/2019   Procedure: LEFT HEART CATH AND CORONARY ANGIOGRAPHY;  Surgeon: Burnell Blanks, MD;  Location: [facility];;; prox RCA 20%. Ost-prox Cx 20%. ostial D1 70% & mLAD 70% => (DES PCI of LAD)   MEMBRANE PEEL Right 08/27/2013   Procedure: MEMBRANE  PEEL;  Surgeon: Hayden Pedro, MD;  Location: [facility];  Service: Ophthalmology;  Laterality: Right;   PARS PLANA VITRECTOMY Right 08/27/2013   Procedure: PARS PLANA VITRECTOMY WITH 25 GAUGE;  Surgeon: Hayden Pedro, MD;  Location: [facility];  Service: Ophthalmology;  Laterality: Right;   TOTAL KNEE ARTHROPLASTY Right 01/09/2021   Procedure: RIGHT TOTAL KNEE ARTHROPLASTY;  Surgeon: [name], MD;  Location: [facility];  Service: Orthopedics;  Laterality: Right;  Needs RNFA   Social History   Social History Narrative   Patient lives at home with family.   Caffeine Use: 1 pot and a half a day   Immunization History  Administered Date(s) Administered   MMR 08/26/2007   Td 08/11/2007   Tdap 08/12/2014     Objective: Vital Signs: There were no vitals taken for this visit.   Physical Exam   Musculoskeletal Exam: ***  CDAI Exam: CDAI Score: -- Patient Global: --; Provider Global: -- Swollen: --; Tender: -- Joint Exam 02/21/2021   No joint exam has been documented for this visit   There is currently no information documented on the homunculus. Go to the Rheumatology activity and complete the homunculus joint exam.  Investigation: No additional findings.  Imaging: No results found.  Recent Labs: Lab Results  Component Value Date   WBC 10.4 01/11/2021   HGB 10.4 (L) 01/11/2021   PLT 156 01/11/2021   NA 136 01/03/2021   K 3.5 01/03/2021   CL 102 01/03/2021   CO2 26 01/03/2021   GLUCOSE 166 (H) 01/03/2021   BUN 9 01/03/2021   CREATININE 0.94 01/03/2021   BILITOT 0.4 01/03/2021   ALKPHOS 82 01/03/2021   AST 16 01/03/2021   ALT 9 01/03/2021   PROT 6.4 (L) 01/03/2021   ALBUMIN 3.5 01/03/2021   CALCIUM 8.8 (L) 01/03/2021   GFRAA 87 03/03/2020   QFTBGOLDPLUS NEGATIVE 09/15/2020    Speciality Comments: No specialty comments available.  Procedures:  No procedures performed Allergies: Penicillins, Bee venom, Prednisone, Amoxicillin-pot clavulanate, and Doxycycline hyclate    Assessment / Plan:     Visit Diagnoses: No diagnosis found.  Orders: No orders of the defined types were placed in this encounter.  No orders of the defined types were placed in this encounter.   Face-to-face time spent with patient was *** minutes. Greater than 50% of time was spent in counseling and coordination of care.  Follow-Up Instructions: No follow-ups on file.   Earnestine Mealing, CMA  Note - This record has been created using Editor, commissioning.  Chart creation errors have been sought, but may not always  have been located. Such creation errors do not reflect on  the standard of medical care.

## 2021-02-08 ENCOUNTER — Ambulatory Visit (INDEPENDENT_AMBULATORY_CARE_PROVIDER_SITE_OTHER): Payer: Medicare Other | Admitting: *Deleted

## 2021-02-08 DIAGNOSIS — R262 Difficulty in walking, not elsewhere classified: Secondary | ICD-10-CM | POA: Diagnosis not present

## 2021-02-08 DIAGNOSIS — Z96651 Presence of right artificial knee joint: Secondary | ICD-10-CM | POA: Diagnosis not present

## 2021-02-08 DIAGNOSIS — R29898 Other symptoms and signs involving the musculoskeletal system: Secondary | ICD-10-CM | POA: Diagnosis not present

## 2021-02-08 DIAGNOSIS — Z789 Other specified health status: Secondary | ICD-10-CM | POA: Diagnosis not present

## 2021-02-08 DIAGNOSIS — M25661 Stiffness of right knee, not elsewhere classified: Secondary | ICD-10-CM | POA: Diagnosis not present

## 2021-02-08 DIAGNOSIS — M25561 Pain in right knee: Secondary | ICD-10-CM | POA: Diagnosis not present

## 2021-02-08 DIAGNOSIS — Z0000 Encounter for general adult medical examination without abnormal findings: Secondary | ICD-10-CM | POA: Diagnosis not present

## 2021-02-08 NOTE — Progress Notes (Signed)
MEDICARE ANNUAL WELLNESS VISIT  02/08/2021  Telephone Visit Disclaimer This Medicare AWV was conducted by telephone due to national recommendations for restrictions regarding the COVID-19 Pandemic (e.g. social distancing).  I verified, using two identifiers, that I am speaking with Courtney Mcconnell or their authorized healthcare agent. I discussed the limitations, risks, security, and privacy concerns of performing an evaluation and management service by telephone and the potential availability of an in-person appointment in the future. The patient expressed understanding and agreed to proceed.  Location of Patient: home Location of Provider (nurse):  office  Subjective:    Courtney Mcconnell is a 65 y.o. female patient of Dettinger, Fransisca Kaufmann, MD who had a Medicare Annual Wellness Visit today via telephone. Courtney Mcconnell is Disabled and lives with their son. she has 2 children. she reports that she is not socially active and does interact with friends/family regularly. she is not physically active and enjoys word puzzles..  Patient Care Team: Dettinger, Fransisca Kaufmann, MD as PCP - General (Family Medicine) Satira Sark, MD as PCP - Cardiology (Cardiology)  Advanced Directives 02/08/2021 01/09/2021 01/03/2021 09/27/2020 04/11/2020 01/17/2020 01/17/2020  Does Patient Have a Medical Advance Directive? No No No No No No No  Would patient like information on creating a medical advance directive? No - Patient declined No - Patient declined - No - Patient declined - No - Patient declined No - Patient declined    Hospital Utilization Over the Past 12 Months: # of hospitalizations or ER visits: 1 # of surgeries: 1  Review of Systems    Patient reports that her overall health is better compared to last year.  History obtained from chart review and the patient  Patient Reported Readings (BP, Pulse, CBG, Weight, etc) none  Pain Assessment Pain : No/denies pain     Current Medications & Allergies  (verified) Allergies as of 02/08/2021       Reactions   Penicillins Hives   Bee Venom Swelling   Prednisone Other (See Comments)   Patient did not like how it made her feel, caused jittery feeling.    Amoxicillin-pot Clavulanate Rash   Doxycycline Hyclate Itching, Rash        Medication List        Accurate as of February 08, 2021  4:42 PM. If you have any questions, ask your nurse or doctor.          STOP taking these medications    benzonatate 100 MG capsule Commonly known as: Tessalon Perles       TAKE these medications    Advair Diskus 100-50 MCG/ACT Aepb Generic drug: fluticasone-salmeterol inhale ONE PUFF into THE lungs IN THE MORNING AND IN THE EVENING What changed: See the new instructions.   albuterol 108 (90 Base) MCG/ACT inhaler Commonly known as: Ventolin HFA INHALE TWO PUFFS INTO THE LUNGS EVERY 6 HOURS AS NEEDED FOR WHEEZING OR SHORTNESS OF BREATH   aspirin 325 MG EC tablet Take 1 tablet (325 mg total) by mouth daily with breakfast.   atorvastatin 40 MG tablet Commonly known as: LIPITOR TAKE 1 TABLET BY MOUTH DAILY AT 6PM   carvedilol 3.125 MG tablet Commonly known as: COREG TAKE 1 TABLET BY MOUTH TWICE DAILY WITH A MEAL   diclofenac Sodium 1 % Gel Commonly known as: Voltaren Apply 2 g topically 4 (four) times daily. What changed:  when to take this reasons to take this   folic acid 1 MG tablet Commonly known as: FOLVITE Take  1 tablet (1 mg total) by mouth daily.   HYDROcodone-acetaminophen 5-325 MG tablet Commonly known as: NORCO/VICODIN TAKE 2 TABLETS BY MOUTH EVERY 4 HOURS AS NEEDED FOR moderate pain (pain score 4-6)   losartan 25 MG tablet Commonly known as: COZAAR TAKE 1 TABLET BY MOUTH EVERY DAY   meloxicam 15 MG tablet Commonly known as: MOBIC Take 1 tablet (15 mg total) by mouth daily. What changed:  when to take this reasons to take this   methocarbamol 500 MG tablet Commonly known as: ROBAXIN TAKE 1 TABLET BY  MOUTH EVERY 8 HOURS AS NEEDED FOR FOR MUSCLE SPASMS   nitroGLYCERIN 0.4 MG SL tablet Commonly known as: NITROSTAT Place 1 tablet (0.4 mg total) under the tongue every 5 (five) minutes as needed for chest pain.   omeprazole 20 MG tablet Commonly known as: PRILOSEC OTC Take 20 mg by mouth 2 (two) times daily.   Spiriva HandiHaler 18 MCG inhalation capsule Generic drug: tiotropium INHALE 1 PUFF BY MOUTH DAILY   Vimpat 150 MG Tabs Generic drug: Lacosamide Take 300 mg by mouth 2 (two) times daily.   Xarelto Starter Pack Generic drug: Rivaroxaban Stater Pack (15 mg and 20 mg) Follow package directions: Take one 110m tablet by mouth twice a day. On day 22, switch to one 262mtablet once a day. Take with food.   zonisamide 100 MG capsule Commonly known as: ZONEGRAN Take 200 mg by mouth at bedtime.        History (reviewed): Past Medical History:  Diagnosis Date   Asthma    Bursitis    Hip   CAD (coronary artery disease) 02/11/2019   PCI/DES x1 to mLAD, focal ostial 70% lesion in 1st diag   COPD (chronic obstructive pulmonary disease) ([facility])    Essential hypertension    GERD (gastroesophageal reflux disease)    Headache(784.0)    Hyperlipidemia    [place]STEMI (non-ST elevated myocardial infarction) [place]   Pneumonia    [facility]   Sleep apnea    Past Surgical History:  Procedure Laterality Date   ABDOMINAL HYSTERECTOMY     APPENDECTOMY     CATARACT EXTRACTION Bilateral    CHOLECYSTECTOMY     CORONARY STENT INTERVENTION N/A 02/11/2019   Procedure: CORONARY STENT INTERVENTION;  Surgeon: McBurnell BlanksMD;  [facility];; mid LAD 70% -- DES PCI (Synergy DES 2.5 x 28 --2.8 mm)   heal spur     right   INTRAVASCULAR PRESSURE WIRE/FFR STUDY N/A 02/11/2019   Procedure: INTRAVASCULAR PRESSURE WIRE/FFR STUDY;  Surgeon: McBurnell BlanksMD;  [facility];  Service: Cardiovascular;  Laterality: N/A;   LASER PHOTO  ABLATION Right 08/27/2013   Procedure: LASER PHOTO ABLATION;  Surgeon: JoHayden PedroMD;  [facility]: [facility];  Laterality: Right;  Headscope laser AND Endolaser   LEFT HEART CATH AND CORONARY ANGIOGRAPHY N/A 02/11/2019   Procedure: LEFT HEART CATH AND CORONARY ANGIOGRAPHY;  Surgeon: McBurnell BlanksMD;  [facility];;; prox RCA 20%. Ost-prox Cx 20%. ostial D1 70% & mLAD 70% => (DES PCI of LAD)   MEMBRANE PEEL Right 08/27/2013   Procedure: MEMBRANE PEEL;  Surgeon: JoHayden PedroMD;  [facility] Service: Ophthal[facility];  Laterality: Right;   PARS PLANA VITRECTOMY Right 08/27/2013   Procedure: PARS PLANA VITRECTOMY WITH 25 GAUGE;  Surgeon: JoHayden PedroMD;  [facility] Service: Ophthalmology;  Laterality: Right;  TOTAL KNEE ARTHROPLASTY Right 01/09/2021   Procedure: RIGHT TOTAL KNEE ARTHROPLASTY;  Surgeon: [name], MD;  Location: [facility];  Service: Orthopedics;  Laterality: Right;  Needs RNFA   Family History  Problem Relation Age of Onset   COPD Mother    Diabetes Father    Hypertension Father    Hyperlipidemia Father    Cancer Sister        Bone Cancer   Diabetes Brother    Cancer Brother    Cancer Paternal Uncle    Stroke Paternal Grandmother    Healthy Daughter    Healthy Son    Colon cancer Neg Hx    Social History   Socioeconomic History   Marital status: Widowed    Spouse name: [name]   Number of children: 2   Years of education: GED   Highest education level: Not on file  Occupational History   Occupation: COOK    Employer: [employer]  Tobacco Use   Smoking status: Former    Packs/day: 0.25    Years: 42.00    Pack years: 10.50    Types: Cigarettes    Quit date: 03/20/2014    Years since quitting: 6.8   Smokeless tobacco: Never   Tobacco comments:    01/21/15 restarted smoking 4 mos ago  Vaping Use   Vaping Use: Never used  Substance and Sexual Activity   Alcohol use: No     Alcohol/week: 0.0 standard drinks   Drug use: No   Sexual activity: Not on file  Other Topics Concern   Not on file  Social History Narrative   Patient lives at home with family.   Caffeine Use: 1 pot and a half a day   Social Determinants of Health   Financial Resource Strain: Low Risk    Difficulty of Paying Living Expenses: Not very hard  Food Insecurity: No Food Insecurity   Worried About Charity fundraiser in the Last Year: Never true   Ran Out of Food in the Last Year: Never true  Transportation Needs: Unknown   Lack of Transportation (Medical): Not on file   Lack of Transportation (Non-Medical): No  Physical Activity: Inactive   Days of Exercise per Week: 0 days   Minutes of Exercise per Session: 0 min  Stress: No Stress Concern Present   Feeling of Stress : Only a little  Social Connections: Moderately Isolated   Frequency of Communication with Friends and Family: More than three times a week   Frequency of Social Gatherings with Friends and Family: Three times a week   Attends Religious Services: 1 to 4 times per year   Active Member of Clubs or Organizations: No   Attends Archivist Meetings: Never   Marital Status: Widowed    Activities of Daily Living In your present state of health, do you have any difficulty performing the following activities: 02/08/2021 01/09/2021  Hearing? N N  Vision? N N  Difficulty concentrating or making decisions? N N  Walking or climbing stairs? N Y  Dressing or bathing? N Y  Doing errands, shopping? Y Y  Comment She does not drive d/t seizures -  Conservation officer, nature and eating ? N -  Using the Toilet? N -  In the past six months, have you accidently leaked urine? N -  Do you have problems with loss of bowel control? N -  Managing your Medications? N -  Managing your Finances? N -  Some recent data  might be hidden    Patient Education/ Literacy What is the last grade level you completed in school?: 9th/  G[employer]  Exercise Current Exercise Habits: The patient does not participate in regular exercise at present, Exercise limited by: None identified  Diet Patient reports consuming 2 meals a day and 0 snack(s) a day Patient reports that her primary diet is: Regular Patient reports that she does have regular access to food.   Depression Screen PHQ 2/9 Scores 02/08/2021 10/27/2020 08/09/2020 06/24/2020 04/14/2020 03/03/2020 10/23/2019  PHQ - 2 Score 0 0 0 0 0 0 0  PHQ- 9 Score - - - - - - -     Fall Risk Fall Risk  02/08/2021 10/27/2020 06/24/2020 04/14/2020 03/03/2020  Falls in the past year? 0 0 0 0 0  Number falls in past yr: - - - - -  Injury with Fall? - - - - -  Comment - - - - -  Risk for fall due to : - - - - -  Risk for fall due to: Comment - - - - -     Objective:  Courtney Mcconnell seemed alert and oriented and she participated appropriately during our telephone visit.  Blood Pressure Weight BMI  BP Readings from Last 3 Encounters:  01/11/21 126/60  01/03/21 137/61  12/23/20 (!) 142/77   Wt Readings from Last 3 Encounters:  01/19/21 240 lb (108.9 kg)  01/09/21 240 lb (108.9 kg)  12/29/20 245 lb 6.4 oz (111.3 kg)   BMI Readings from Last 1 Encounters:  01/19/21 38.74 kg/m    *Unable to obtain current vital signs, weight, and BMI due to telephone visit type  Hearing/Vision  Courtney Mcconnell did not seem to have difficulty with hearing/understanding during the telephone conversation Reports that she has had a formal eye exam by an eye care professional within the past year Reports that she has not had a formal hearing evaluation within the past year *Unable to fully assess hearing and vision during telephone visit type  Cognitive Function: 6CIT Screen 02/08/2021  What Year? 0 points  What month? 0 points  What time? 0 points  Count back from 20 0 points  Months in reverse 2 points  Repeat phrase 2 points  Total Score 4   (Normal:0-7, Significant for Dysfunction: >8)  Normal  Cognitive Function Screening: Yes   Immunization & Health Maintenance Record Immunization History  Administered Date(s) Administered   MMR 08/26/2007   Td 08/11/2007   Tdap 08/12/2014    Health Maintenance  Topic Date Due   Pneumonia Vaccine 35+ Years old (1 - PCV) Never done   INFLUENZA VACCINE  05/12/2021 (Originally 09/12/2020)   COLONOSCOPY (Pts 45-60yr Insurance coverage will need to be confirmed)  06/24/2021 (Originally 10/06/2000)   COVID-19 Vaccine (1) 10/16/2021 (Originally 04/08/1956)   Zoster Vaccines- Shingrix (1 of 2) 01/15/2022 (Originally 10/07/1974)   MAMMOGRAM  04/06/2022   TETANUS/TDAP  08/11/2024   DEXA SCAN  Completed   Hepatitis C Screening  Completed   HIV Screening  Completed   HPV VACCINES  Aged Out       Assessment  This is a routine wellness examination for Courtney Mcconnell.  Health Maintenance: Due or Overdue Health Maintenance Due  Topic Date Due   Pneumonia Vaccine 65 Years old (1 - PCV) Never done    Courtney Mcconnell does not need a referral for Community Assistance: Care Management:   no Social Work:    no Prescription Assistance:  no  Nutrition/Diabetes Education:  no   Plan:  Personalized Goals  Goals Addressed             This Visit's Progress    Weight (lb) < 200 lb (90.7 kg)       Goal is to lose 20 lbs       Personalized Health Maintenance & Screening Recommendations  Pneumococcal vaccine  Influenza vaccine Td vaccine Pt refuses vaccines at this time  Lung Cancer Screening Recommended: yes (Low Dose CT Chest recommended if Age 49-80 years, 30 pack-year currently smoking OR have quit w/in past 15 years) Hepatitis C Screening recommended: no HIV Screening recommended: no  Advanced Directives: Written information was not prepared per patient's request.  Referrals & Orders No orders of the defined types were placed in this encounter.   Follow-up Plan Follow-up with Dettinger, Fransisca Kaufmann, MD as planned Pt is independent  with all ADL's, she recently had knee surgery and is doing well. Pt declines flu, covid, tdap and prevnar. Pt hearing and vision are good. Pt does not have advanced directives, declined information at this time. Pt should have a chest CT d/t 40+ years of smoking. Pt has no healthcare concerns or questions at this time. AVS mailed to pt.    I have personally reviewed and noted the following in the patients chart:   Medical and social history Use of alcohol, tobacco or illicit drugs  Current medications and supplements Functional ability and status Nutritional status Physical activity Advanced directives List of other physicians Hospitalizations, surgeries, and ER visits in previous 12 months Vitals Screenings to include cognitive, depression, and falls Referrals and appointments  In addition, I have reviewed and discussed with Courtney Mcconnell certain preventive protocols, quality metrics, and best practice recommendations. A written personalized care plan for preventive services as well as general preventive health recommendations is available and can be mailed to the patient at her request.      Rana Snare, LPN  [id number]

## 2021-02-10 DIAGNOSIS — Z789 Other specified health status: Secondary | ICD-10-CM | POA: Diagnosis not present

## 2021-02-10 DIAGNOSIS — R29898 Other symptoms and signs involving the musculoskeletal system: Secondary | ICD-10-CM | POA: Diagnosis not present

## 2021-02-10 DIAGNOSIS — R262 Difficulty in walking, not elsewhere classified: Secondary | ICD-10-CM | POA: Diagnosis not present

## 2021-02-10 DIAGNOSIS — Z96651 Presence of right artificial knee joint: Secondary | ICD-10-CM | POA: Diagnosis not present

## 2021-02-10 DIAGNOSIS — M25561 Pain in right knee: Secondary | ICD-10-CM | POA: Diagnosis not present

## 2021-02-10 DIAGNOSIS — M25661 Stiffness of right knee, not elsewhere classified: Secondary | ICD-10-CM | POA: Diagnosis not present

## 2021-02-15 DIAGNOSIS — R29898 Other symptoms and signs involving the musculoskeletal system: Secondary | ICD-10-CM | POA: Diagnosis not present

## 2021-02-15 DIAGNOSIS — Z7409 Other reduced mobility: Secondary | ICD-10-CM | POA: Diagnosis not present

## 2021-02-15 DIAGNOSIS — R262 Difficulty in walking, not elsewhere classified: Secondary | ICD-10-CM | POA: Diagnosis not present

## 2021-02-15 DIAGNOSIS — M6281 Muscle weakness (generalized): Secondary | ICD-10-CM | POA: Diagnosis not present

## 2021-02-15 DIAGNOSIS — Z96651 Presence of right artificial knee joint: Secondary | ICD-10-CM | POA: Diagnosis not present

## 2021-02-15 DIAGNOSIS — M25661 Stiffness of right knee, not elsewhere classified: Secondary | ICD-10-CM | POA: Diagnosis not present

## 2021-02-15 DIAGNOSIS — Z789 Other specified health status: Secondary | ICD-10-CM | POA: Diagnosis not present

## 2021-02-15 DIAGNOSIS — M25561 Pain in right knee: Secondary | ICD-10-CM | POA: Diagnosis not present

## 2021-02-16 ENCOUNTER — Ambulatory Visit (INDEPENDENT_AMBULATORY_CARE_PROVIDER_SITE_OTHER): Payer: Medicaid Other | Admitting: Orthopaedic Surgery

## 2021-02-16 ENCOUNTER — Other Ambulatory Visit: Payer: Self-pay

## 2021-02-16 ENCOUNTER — Encounter: Payer: Self-pay | Admitting: Orthopaedic Surgery

## 2021-02-16 VITALS — Ht 66.0 in | Wt 240.0 lb

## 2021-02-16 DIAGNOSIS — R918 Other nonspecific abnormal finding of lung field: Secondary | ICD-10-CM

## 2021-02-16 DIAGNOSIS — I824Z1 Acute embolism and thrombosis of unspecified deep veins of right distal lower extremity: Secondary | ICD-10-CM

## 2021-02-16 MED ORDER — RIVAROXABAN 20 MG PO TABS: 20.0000 mg | ORAL_TABLET | Freq: Every day | ORAL | 0 refills | Status: DC

## 2021-02-16 NOTE — Progress Notes (Signed)
Post-Op Visit Note   Patient: Courtney Mcconnell           Date of Birth: 1955/04/30           MRN: [id number] Visit Date: 02/16/2021 PCP: Dettinger, Fransisca Kaufmann, MD   Assessment & Plan: Postop right total knee arthroplasty she is flexing past 110 degrees quad strength good she is amatory with a cane.  She lacks 5 degrees reaching extension and needs to work hard to get a full extension.  Opposite knee which also has arthritis does not come forward 19 either.  She had DVT diagnosed 12/8 and she is now 4 weeks out from starting Xarelto.  Xarelto 20 mg 1 p.o. daily x2 weeks sent in.  DVT was in the lesser saphenous and in the gastroc vein.  She had an old injury to the medial ankle with damage to the greater saphenous at that level.  She will use her TED hose for swelling recheck 4 weeks.  Chief Complaint:  Chief Complaint  Patient presents with   Right Knee - Follow-up    01/09/2021 right TKA   Visit Diagnoses:  1. Acute deep vein thrombosis (DVT) of distal vein of right lower extremity (HCC)   2. Pulmonary nodules     Plan: ROV 4 wks  Follow-Up Instructions: Return in about 4 weeks (around 03/16/2021).   Orders:  No orders of the defined types were placed in this encounter.  Meds ordered this encounter  Medications   rivaroxaban (XARELTO) 20 MG TABS tablet    Sig: Take 1 tablet (20 mg total) by mouth daily with supper.    Dispense:  15 tablet    Refill:  0    Imaging: No results found.  PMFS History: Patient Active Problem List   Diagnosis Date Noted   DVT (deep venous thrombosis) ([place]) 01/19/2021   S/P TKR (total knee replacement), right 01/09/2021   Rheumatoid arthritis with rheumatoid factor of multiple sites without organ or systems involvement ([place]) 12/02/2020   Coronary artery disease involving native coronary artery of native heart with angina pectoris ([place]) 05/22/2019   Hyperlipidemia with target LDL less than 70 -> CAD 02/12/2019   Presence of drug coated stent in  LAD coronary artery 02/11/2019   History of non-ST elevation myocardial infarction (NSTEMI) 02/10/2019   Cerebral infarction, remote, resolved 06/27/2018   Pulmonary nodules 12/08/2015   Sleep apnea 04/06/2015   Pulmonary emphysema ([place]) 03/02/2015   Smoker 02/16/2015   Seizure disorder ([place]) 01/21/2015   Focal epilepsy with impairment of consciousness ([place]) 12/13/2014   Essential hypertension 04/18/2014   Preretinal fibrosis, 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   Tobacco use disorder 07/19/2012   Past Medical History:  Diagnosis Date   Asthma    Bursitis    Hip   CAD (coronary artery disease) 02/11/2019   PCI/DES x1 to mLAD, focal ostial 70% lesion in 1st diag   COPD (chronic obstructive pulmonary disease) ([place])    Essential hypertension    GERD (gastroesophageal reflux disease)    Headache(784.0)    Hyperlipidemia    NSTEMI (non-ST elevated myocardial infarction) ([place]) 02/11/2019   Pneumonia    Seizure ([place])    Sleep apnea     Family History  Problem Relation Age of Onset   COPD Mother    Diabetes Father    Hypertension Father    Hyperlipidemia Father    Cancer Sister        Bone Cancer   Diabetes Brother    Cancer  Brother    Cancer Paternal Uncle    Stroke Paternal Grandmother    Healthy Daughter    Healthy Son    Colon cancer Neg Hx     Past Surgical History:  Procedure Laterality Date   ABDOMINAL HYSTERECTOMY     APPENDECTOMY     CATARACT EXTRACTION Bilateral    CHOLECYSTECTOMY     CORONARY STENT INTERVENTION N/A 02/11/2019   Procedure: CORONARY STENT INTERVENTION;  Surgeon: Burnell Blanks, MD;  Location: [facility];; mid LAD 70% -- DES PCI (Synergy DES 2.5 x 28 --2.8 mm)   heal spur     right   INTRAVASCULAR PRESSURE WIRE/FFR STUDY N/A 02/11/2019   Procedure: INTRAVASCULAR PRESSURE WIRE/FFR STUDY;  Surgeon: Burnell Blanks, MD;  Location: [facility];  Service: Cardiovascular;  Laterality: N/A;   LASER PHOTO ABLATION Right 08/27/2013    Procedure: LASER PHOTO ABLATION;  Surgeon: Hayden Pedro, MD;  Location: [facility];  Service: Ophthalmology;  Laterality: Right;  Headscope laser AND Endolaser   LEFT HEART CATH AND CORONARY ANGIOGRAPHY N/A 02/11/2019   Procedure: LEFT HEART CATH AND CORONARY ANGIOGRAPHY;  Surgeon: Burnell Blanks, MD;  Location: [facility];;; prox RCA 20%. Ost-prox Cx 20%. ostial D1 70% & mLAD 70% => (DES PCI of LAD)   MEMBRANE PEEL Right 08/27/2013   Procedure: MEMBRANE PEEL;  Surgeon: Hayden Pedro, MD;  Location: [facility];  Service: Ophthalmology;  Laterality: Right;   PARS PLANA VITRECTOMY Right 08/27/2013   Procedure: PARS PLANA VITRECTOMY WITH 25 GAUGE;  Surgeon: Hayden Pedro, MD;  Location: [facility];  Service: Ophthalmology;  Laterality: Right;   TOTAL KNEE ARTHROPLASTY Right 01/09/2021   Procedure: RIGHT TOTAL KNEE ARTHROPLASTY;  Surgeon: [name], MD;  Location: [facility];  Service: Orthopedics;  Laterality: Right;  Needs RNFA   Social History   Occupational History   Occupation: COOK    Employer: [employer]  Tobacco Use   Smoking status: Former    Packs/day: 0.25    Years: 42.00    Pack years: 10.50    Types: Cigarettes    Quit date: 03/20/2014    Years since quitting: 6.9   Smokeless tobacco: Never   Tobacco comments:    01/21/15 restarted smoking 4 mos ago  Vaping Use   Vaping Use: Never used  Substance and Sexual Activity   Alcohol use: No    Alcohol/week: 0.0 standard drinks   Drug use: No   Sexual activity: Not on file

## 2021-02-17 DIAGNOSIS — Z789 Other specified health status: Secondary | ICD-10-CM | POA: Diagnosis not present

## 2021-02-17 DIAGNOSIS — R262 Difficulty in walking, not elsewhere classified: Secondary | ICD-10-CM | POA: Diagnosis not present

## 2021-02-17 DIAGNOSIS — M25561 Pain in right knee: Secondary | ICD-10-CM | POA: Diagnosis not present

## 2021-02-17 DIAGNOSIS — Z7409 Other reduced mobility: Secondary | ICD-10-CM | POA: Diagnosis not present

## 2021-02-17 DIAGNOSIS — Z96651 Presence of right artificial knee joint: Secondary | ICD-10-CM | POA: Diagnosis not present

## 2021-02-17 DIAGNOSIS — R29898 Other symptoms and signs involving the musculoskeletal system: Secondary | ICD-10-CM | POA: Diagnosis not present

## 2021-02-17 DIAGNOSIS — M6281 Muscle weakness (generalized): Secondary | ICD-10-CM | POA: Diagnosis not present

## 2021-02-17 DIAGNOSIS — M25661 Stiffness of right knee, not elsewhere classified: Secondary | ICD-10-CM | POA: Diagnosis not present

## 2021-02-21 ENCOUNTER — Ambulatory Visit: Payer: Medicare Other | Admitting: Rheumatology

## 2021-02-22 DIAGNOSIS — R262 Difficulty in walking, not elsewhere classified: Secondary | ICD-10-CM | POA: Diagnosis not present

## 2021-02-22 DIAGNOSIS — M25661 Stiffness of right knee, not elsewhere classified: Secondary | ICD-10-CM | POA: Diagnosis not present

## 2021-02-22 DIAGNOSIS — R29898 Other symptoms and signs involving the musculoskeletal system: Secondary | ICD-10-CM | POA: Diagnosis not present

## 2021-02-22 DIAGNOSIS — Z7409 Other reduced mobility: Secondary | ICD-10-CM | POA: Diagnosis not present

## 2021-02-22 DIAGNOSIS — M25561 Pain in right knee: Secondary | ICD-10-CM | POA: Diagnosis not present

## 2021-02-22 DIAGNOSIS — Z96651 Presence of right artificial knee joint: Secondary | ICD-10-CM | POA: Diagnosis not present

## 2021-02-22 DIAGNOSIS — M6281 Muscle weakness (generalized): Secondary | ICD-10-CM | POA: Diagnosis not present

## 2021-02-22 DIAGNOSIS — Z789 Other specified health status: Secondary | ICD-10-CM | POA: Diagnosis not present

## 2021-02-24 DIAGNOSIS — R262 Difficulty in walking, not elsewhere classified: Secondary | ICD-10-CM | POA: Diagnosis not present

## 2021-02-24 DIAGNOSIS — Z7409 Other reduced mobility: Secondary | ICD-10-CM | POA: Diagnosis not present

## 2021-02-24 DIAGNOSIS — Z789 Other specified health status: Secondary | ICD-10-CM | POA: Diagnosis not present

## 2021-02-24 DIAGNOSIS — M6281 Muscle weakness (generalized): Secondary | ICD-10-CM | POA: Diagnosis not present

## 2021-02-24 DIAGNOSIS — M25661 Stiffness of right knee, not elsewhere classified: Secondary | ICD-10-CM | POA: Diagnosis not present

## 2021-02-24 DIAGNOSIS — M25561 Pain in right knee: Secondary | ICD-10-CM | POA: Diagnosis not present

## 2021-02-24 DIAGNOSIS — Z96651 Presence of right artificial knee joint: Secondary | ICD-10-CM | POA: Diagnosis not present

## 2021-02-24 DIAGNOSIS — R29898 Other symptoms and signs involving the musculoskeletal system: Secondary | ICD-10-CM | POA: Diagnosis not present

## 2021-03-01 DIAGNOSIS — M6281 Muscle weakness (generalized): Secondary | ICD-10-CM | POA: Diagnosis not present

## 2021-03-01 DIAGNOSIS — Z7409 Other reduced mobility: Secondary | ICD-10-CM | POA: Diagnosis not present

## 2021-03-01 DIAGNOSIS — R262 Difficulty in walking, not elsewhere classified: Secondary | ICD-10-CM | POA: Diagnosis not present

## 2021-03-01 DIAGNOSIS — Z96651 Presence of right artificial knee joint: Secondary | ICD-10-CM | POA: Diagnosis not present

## 2021-03-01 DIAGNOSIS — M25561 Pain in right knee: Secondary | ICD-10-CM | POA: Diagnosis not present

## 2021-03-01 DIAGNOSIS — R29898 Other symptoms and signs involving the musculoskeletal system: Secondary | ICD-10-CM | POA: Diagnosis not present

## 2021-03-01 DIAGNOSIS — M25661 Stiffness of right knee, not elsewhere classified: Secondary | ICD-10-CM | POA: Diagnosis not present

## 2021-03-01 DIAGNOSIS — Z789 Other specified health status: Secondary | ICD-10-CM | POA: Diagnosis not present

## 2021-03-03 DIAGNOSIS — M6281 Muscle weakness (generalized): Secondary | ICD-10-CM | POA: Diagnosis not present

## 2021-03-03 DIAGNOSIS — Z96651 Presence of right artificial knee joint: Secondary | ICD-10-CM | POA: Diagnosis not present

## 2021-03-03 DIAGNOSIS — Z789 Other specified health status: Secondary | ICD-10-CM | POA: Diagnosis not present

## 2021-03-03 DIAGNOSIS — R262 Difficulty in walking, not elsewhere classified: Secondary | ICD-10-CM | POA: Diagnosis not present

## 2021-03-03 DIAGNOSIS — Z7409 Other reduced mobility: Secondary | ICD-10-CM | POA: Diagnosis not present

## 2021-03-03 DIAGNOSIS — M25661 Stiffness of right knee, not elsewhere classified: Secondary | ICD-10-CM | POA: Diagnosis not present

## 2021-03-03 DIAGNOSIS — R29898 Other symptoms and signs involving the musculoskeletal system: Secondary | ICD-10-CM | POA: Diagnosis not present

## 2021-03-03 DIAGNOSIS — M25561 Pain in right knee: Secondary | ICD-10-CM | POA: Diagnosis not present

## 2021-03-08 DIAGNOSIS — M25661 Stiffness of right knee, not elsewhere classified: Secondary | ICD-10-CM | POA: Diagnosis not present

## 2021-03-08 DIAGNOSIS — R29898 Other symptoms and signs involving the musculoskeletal system: Secondary | ICD-10-CM | POA: Diagnosis not present

## 2021-03-08 DIAGNOSIS — M25561 Pain in right knee: Secondary | ICD-10-CM | POA: Diagnosis not present

## 2021-03-08 DIAGNOSIS — Z789 Other specified health status: Secondary | ICD-10-CM | POA: Diagnosis not present

## 2021-03-08 DIAGNOSIS — Z7409 Other reduced mobility: Secondary | ICD-10-CM | POA: Diagnosis not present

## 2021-03-08 DIAGNOSIS — R262 Difficulty in walking, not elsewhere classified: Secondary | ICD-10-CM | POA: Diagnosis not present

## 2021-03-08 DIAGNOSIS — M6281 Muscle weakness (generalized): Secondary | ICD-10-CM | POA: Diagnosis not present

## 2021-03-08 DIAGNOSIS — Z96651 Presence of right artificial knee joint: Secondary | ICD-10-CM | POA: Diagnosis not present

## 2021-03-16 ENCOUNTER — Encounter: Payer: Self-pay | Admitting: Orthopaedic Surgery

## 2021-03-16 ENCOUNTER — Other Ambulatory Visit: Payer: Self-pay

## 2021-03-16 ENCOUNTER — Ambulatory Visit (INDEPENDENT_AMBULATORY_CARE_PROVIDER_SITE_OTHER): Payer: Commercial Managed Care - HMO | Admitting: Orthopaedic Surgery

## 2021-03-16 VITALS — Ht 66.0 in | Wt 240.0 lb

## 2021-03-16 DIAGNOSIS — Z96651 Presence of right artificial knee joint: Secondary | ICD-10-CM

## 2021-03-16 NOTE — Progress Notes (Signed)
Post-Op Visit Note   Patient: Courtney Mcconnell           Date of Birth: 06-May-1955           MRN: [id number] Visit Date: 03/16/2021 PCP: Dettinger, Fransisca Kaufmann, MD   Assessment & Plan: Postop right total knee arthroplasty flexion 110 she lacks 3 degrees full extension still has some quad weakness uses a hop step to get up on the step.  She had DVT postop and was on Xarelto.  She is finished therapy needs to continue quad strengthening exercises at home.  She is just using Advil for relief.  Return 2 months.  Chief Complaint:  Chief Complaint  Patient presents with   Right Knee - Follow-up    01/09/2021 Right TKA   Visit Diagnoses:  1. S/P TKR (total knee replacement), right     Plan: Recheck 2 months.  Follow-Up Instructions: Return in about 2 months (around 05/14/2021).   Orders:  No orders of the defined types were placed in this encounter.  No orders of the defined types were placed in this encounter.   Imaging: No results found.  PMFS History: Patient Active Problem List   Diagnosis Date Noted   DVT (deep venous thrombosis) ([place]) 01/19/2021   S/P TKR (total knee replacement), right 01/09/2021   Rheumatoid arthritis with rheumatoid factor of multiple sites without organ or systems involvement ([place]) 12/02/2020   Coronary artery disease involving native coronary artery of native heart with angina pectoris ([place]) 05/22/2019   Hyperlipidemia with target LDL less than 70 -> CAD 02/12/2019   Presence of drug coated stent in LAD coronary artery 02/11/2019   History of non-ST elevation myocardial infarction (NSTEMI) 02/10/2019   Cerebral infarction, remote, resolved 06/27/2018   Pulmonary nodules 12/08/2015   Sleep apnea 04/06/2015   Pulmonary emphysema ([place]) 03/02/2015   Smoker 02/16/2015   Seizure disorder ([place]) 01/21/2015   Focal epilepsy with impairment of consciousness ([place]) 12/13/2014   Essential hypertension 04/18/2014   Preretinal fibrosis, 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    Tobacco use disorder 07/19/2012   Past Medical History:  Diagnosis Date   Asthma    Bursitis    Hip   CAD (coronary artery disease) 02/11/2019   PCI/DES x1 to mLAD, focal ostial 70% lesion in 1st diag   COPD (chronic obstructive pulmonary disease) ([place])    Essential hypertension    GERD (gastroesophageal reflux disease)    Headache(784.0)    Hyperlipidemia    NSTEMI (non-ST elevated myocardial infarction) ([place]) 02/11/2019   Pneumonia    Seizure ([place])    Sleep apnea     Family History  Problem Relation Age of Onset   COPD Mother    Diabetes Father    Hypertension Father    Hyperlipidemia Father    Cancer Sister        Bone Cancer   Diabetes Brother    Cancer Brother    Cancer Paternal Uncle    Stroke Paternal Grandmother    Healthy Daughter    Healthy Son    Colon cancer Neg Hx     Past Surgical History:  Procedure Laterality Date   ABDOMINAL HYSTERECTOMY     APPENDECTOMY     CATARACT EXTRACTION Bilateral    CHOLECYSTECTOMY     CORONARY STENT INTERVENTION N/A 02/11/2019   Procedure: CORONARY STENT INTERVENTION;  Surgeon: Burnell Blanks, MD;  Location: [facility];; mid LAD 70% -- DES PCI (Synergy DES 2.5 x 28 --2.8 mm)  heal spur     right   INTRAVASCULAR PRESSURE WIRE/FFR STUDY N/A 02/11/2019   Procedure: INTRAVASCULAR PRESSURE WIRE/FFR STUDY;  Surgeon: Burnell Blanks, MD;  Location: [facility];  Service: Cardiovascular;  Laterality: N/A;   LASER PHOTO ABLATION Right 08/27/2013   Procedure: LASER PHOTO ABLATION;  Surgeon: Hayden Pedro, MD;  Location: [facility];  Service: Ophthalmology;  Laterality: Right;  Headscope laser AND Endolaser   LEFT HEART CATH AND CORONARY ANGIOGRAPHY N/A 02/11/2019   Procedure: LEFT HEART CATH AND CORONARY ANGIOGRAPHY;  Surgeon: Burnell Blanks, MD;  Location: [facility];;; prox RCA 20%. Ost-prox Cx 20%. ostial D1 70% & mLAD 70% => (DES PCI of LAD)   MEMBRANE PEEL Right 08/27/2013    Procedure: MEMBRANE PEEL;  Surgeon: Hayden Pedro, MD;  Location: [facility];  Service: Ophthalmology;  Laterality: Right;   PARS PLANA VITRECTOMY Right 08/27/2013   Procedure: PARS PLANA VITRECTOMY WITH 25 GAUGE;  Surgeon: Hayden Pedro, MD;  Location: [facility];  Service: Ophthalmology;  Laterality: Right;   TOTAL KNEE ARTHROPLASTY Right 01/09/2021   Procedure: RIGHT TOTAL KNEE ARTHROPLASTY;  Surgeon: [name], MD;  Location: [facility];  Service: Orthopedics;  Laterality: Right;  Needs RNFA   Social History   Occupational History   Occupation: COOK    Employer: [employer]  Tobacco Use   Smoking status: Former    Packs/day: 0.25    Years: 42.00    Pack years: 10.50    Types: Cigarettes    Quit date: 03/20/2014    Years since quitting: 6.9   Smokeless tobacco: Never   Tobacco comments:    01/21/15 restarted smoking 4 mos ago  Vaping Use   Vaping Use: Never used  Substance and Sexual Activity   Alcohol use: No    Alcohol/week: 0.0 standard drinks   Drug use: No   Sexual activity: Not on file

## 2021-04-06 ENCOUNTER — Encounter (HOSPITAL_COMMUNITY): Payer: Self-pay | Admitting: *Deleted

## 2021-04-06 ENCOUNTER — Emergency Department (HOSPITAL_COMMUNITY): Payer: Medicare Other

## 2021-04-06 ENCOUNTER — Emergency Department (HOSPITAL_COMMUNITY)
Admission: EM | Admit: 2021-04-06 | Discharge: 2021-04-06 | Disposition: A | Discharge status: Home / Self Care | Payer: Medicare Other | Attending: Emergency Medicine | Admitting: Emergency Medicine

## 2021-04-06 ENCOUNTER — Other Ambulatory Visit: Payer: Self-pay

## 2021-04-06 ENCOUNTER — Telehealth: Payer: Self-pay | Admitting: Family Medicine

## 2021-04-06 DIAGNOSIS — M533 Sacrococcygeal disorders, not elsewhere classified: Secondary | ICD-10-CM | POA: Diagnosis not present

## 2021-04-06 DIAGNOSIS — I700 Atherosclerosis of aorta: Secondary | ICD-10-CM | POA: Diagnosis not present

## 2021-04-06 DIAGNOSIS — R109 Unspecified abdominal pain: Secondary | ICD-10-CM | POA: Diagnosis not present

## 2021-04-06 DIAGNOSIS — Z7901 Long term (current) use of anticoagulants: Secondary | ICD-10-CM | POA: Diagnosis not present

## 2021-04-06 DIAGNOSIS — R051 Acute cough: Secondary | ICD-10-CM | POA: Insufficient documentation

## 2021-04-06 DIAGNOSIS — I2510 Atherosclerotic heart disease of native coronary artery without angina pectoris: Secondary | ICD-10-CM | POA: Diagnosis not present

## 2021-04-06 DIAGNOSIS — J449 Chronic obstructive pulmonary disease, unspecified: Secondary | ICD-10-CM | POA: Diagnosis not present

## 2021-04-06 DIAGNOSIS — J45909 Unspecified asthma, uncomplicated: Secondary | ICD-10-CM | POA: Insufficient documentation

## 2021-04-06 DIAGNOSIS — K7689 Other specified diseases of liver: Secondary | ICD-10-CM | POA: Diagnosis not present

## 2021-04-06 DIAGNOSIS — Z79899 Other long term (current) drug therapy: Secondary | ICD-10-CM | POA: Insufficient documentation

## 2021-04-06 DIAGNOSIS — R059 Cough, unspecified: Secondary | ICD-10-CM | POA: Diagnosis not present

## 2021-04-06 DIAGNOSIS — R079 Chest pain, unspecified: Secondary | ICD-10-CM | POA: Diagnosis not present

## 2021-04-06 DIAGNOSIS — R0781 Pleurodynia: Secondary | ICD-10-CM | POA: Diagnosis not present

## 2021-04-06 LAB — CBC WITH DIFFERENTIAL/PLATELET
Abs Immature Granulocytes: 0.02 10*3/uL (ref 0.00–0.07)
Basophils Absolute: 0 10*3/uL (ref 0.0–0.1)
Basophils Relative: 1 %
Eosinophils Absolute: 0.2 10*3/uL (ref 0.0–0.5)
Eosinophils Relative: 2 %
HCT: 38.9 % (ref 36.0–46.0)
Hemoglobin: 12.8 g/dL (ref 12.0–15.0)
Immature Granulocytes: 0 %
Lymphocytes Relative: 36 %
Lymphs Abs: 2.3 10*3/uL (ref 0.7–4.0)
MCH: 32 pg (ref 26.0–34.0)
MCHC: 32.9 g/dL (ref 30.0–36.0)
MCV: 97.3 fL (ref 80.0–100.0)
Monocytes Absolute: 0.7 10*3/uL (ref 0.1–1.0)
Monocytes Relative: 10 %
Neutro Abs: 3.4 10*3/uL (ref 1.7–7.7)
Neutrophils Relative %: 51 %
Platelets: 203 10*3/uL (ref 150–400)
RBC: 4 MIL/uL (ref 3.87–5.11)
RDW: 13.3 % (ref 11.5–15.5)
WBC: 6.5 10*3/uL (ref 4.0–10.5)
nRBC: 0 % (ref 0.0–0.2)

## 2021-04-06 LAB — URINALYSIS, ROUTINE W REFLEX MICROSCOPIC
Bacteria, UA: NONE SEEN
Bilirubin Urine: NEGATIVE
Glucose, UA: NEGATIVE mg/dL
Hgb urine dipstick: NEGATIVE
Ketones, ur: NEGATIVE mg/dL
Nitrite: NEGATIVE
Protein, ur: NEGATIVE mg/dL
Specific Gravity, Urine: 1.003 — ABNORMAL LOW (ref 1.005–1.030)
pH: 6 (ref 5.0–8.0)

## 2021-04-06 LAB — HEPATIC FUNCTION PANEL
ALT: 9 U/L (ref 0–44)
AST: 18 U/L (ref 15–41)
Albumin: 3.8 g/dL (ref 3.5–5.0)
Alkaline Phosphatase: 92 U/L (ref 38–126)
Bilirubin, Direct: 0.1 mg/dL (ref 0.0–0.2)
Indirect Bilirubin: 0.7 mg/dL (ref 0.3–0.9)
Total Bilirubin: 0.8 mg/dL (ref 0.3–1.2)
Total Protein: 7 g/dL (ref 6.5–8.1)

## 2021-04-06 LAB — BASIC METABOLIC PANEL
Anion gap: 7 (ref 5–15)
BUN: 14 mg/dL (ref 8–23)
CO2: 26 mmol/L (ref 22–32)
Calcium: 8.9 mg/dL (ref 8.9–10.3)
Chloride: 104 mmol/L (ref 98–111)
Creatinine, Ser: 0.97 mg/dL (ref 0.44–1.00)
GFR, Estimated: >60 mL/min (ref >60)
Glucose, Bld: 112 mg/dL — ABNORMAL HIGH (ref 70–99)
Potassium: 3.6 mmol/L (ref 3.5–5.1)
Sodium: 137 mmol/L (ref 135–145)

## 2021-04-06 LAB — TROPONIN I (HIGH SENSITIVITY): Troponin I (High Sensitivity): 4 ng/L (ref <18)

## 2021-04-06 MED ORDER — BENZONATATE 100 MG PO CAPS: 100.0000 mg | ORAL_CAPSULE | Freq: Three times a day (TID) | ORAL | 0 refills | Status: DC

## 2021-04-06 NOTE — ED Triage Notes (Signed)
Left flank pain, worse when breathing

## 2021-04-06 NOTE — ED Provider Notes (Signed)
[facility] EMERGENCY DEPARTMENT Provider Note   CSN: [id number] Arrival date & time: 04/06/21  1111     History  Chief Complaint  Patient presents with   Flank Pain    Courtney Mcconnell is a 66 y.o. female with history of COPD, asthma, previous history of pneumonia, NSTEMI status post stent with CAD presents to the ED for evaluation of left posterior back/flank pain that started yesterday.  Patient notes pain is worse upon deep breathing or palpation.  No known trauma or injury.  She denies urinary symptoms.  She has been taking Advil with some relief.  She does note that she has been coughing quite a bit recently from her COPD plus a cold which also aggravates her pain.  She denies fevers, chills, abdominal pain, nausea, vomiting and diarrhea.   Flank Pain      Home Medications Prior to Admission medications   Medication Sig Start Date End Date Taking? Authorizing Provider  ADVAIR DISKUS 100-50 MCG/DOSE AEPB inhale ONE PUFF into THE lungs IN THE MORNING AND IN THE EVENING Patient taking differently: Inhale 1 puff into the lungs 2 (two) times daily. 05/16/20  Yes Dettinger, Fransisca Kaufmann, MD  albuterol (VENTOLIN HFA) 108 (90 Base) MCG/ACT inhaler INHALE TWO PUFFS INTO THE LUNGS EVERY 6 HOURS AS NEEDED FOR WHEEZING OR SHORTNESS OF BREATH 06/10/20  Yes Dettinger, Fransisca Kaufmann, MD  atorvastatin (LIPITOR) 40 MG tablet TAKE 1 TABLET BY MOUTH DAILY AT 6PM 12/22/20  Yes Satira Sark, MD  benzonatate (TESSALON) 100 MG capsule Take 1 capsule (100 mg total) by mouth every 8 (eight) hours. 04/06/21  Yes Kathe Becton R, PA-C  carvedilol (COREG) 3.125 MG tablet TAKE 1 TABLET BY MOUTH TWICE DAILY WITH A MEAL 11/14/20  Yes Leonie Man, MD  diclofenac Sodium (VOLTAREN) 1 % GEL Apply 2 g topically 4 (four) times daily. Patient taking differently: Apply 2 g topically 2 (two) times daily as needed (knee pain). 04/14/20  Yes Dettinger, Fransisca Kaufmann, MD  Lacosamide (VIMPAT) 150 MG TABS Take 300 mg by mouth 2  (two) times daily. 11/02/19  Yes [provider]  losartan (COZAAR) 25 MG tablet TAKE 1 TABLET BY MOUTH EVERY DAY 05/17/20  Yes Leonie Man, MD  omeprazole (PRILOSEC OTC) 20 MG tablet Take 20 mg by mouth 2 (two) times daily.   Yes [provider]  SPIRIVA HANDIHALER 18 MCG inhalation capsule INHALE 1 PUFF BY MOUTH DAILY Patient taking differently: Place 18 mcg into inhaler and inhale daily. 11/14/20  Yes Dettinger, Fransisca Kaufmann, MD  zonisamide (ZONEGRAN) 100 MG capsule Take 200 mg by mouth at bedtime. 07/18/20  Yes [provider]  folic acid (FOLVITE) 1 MG tablet Take 1 tablet (1 mg total) by mouth daily. Patient not taking: Reported on 04/06/2021 10/10/20   Bo Merino, MD  HYDROcodone-acetaminophen (NORCO/VICODIN) 5-325 MG tablet TAKE 2 TABLETS BY MOUTH EVERY 4 HOURS AS NEEDED FOR moderate pain (pain score 4-6) Patient not taking: Reported on 04/06/2021 01/30/21   [name], MD  methocarbamol (ROBAXIN) 500 MG tablet TAKE 1 TABLET BY MOUTH EVERY 8 HOURS AS NEEDED FOR FOR MUSCLE SPASMS Patient not taking: Reported on 04/06/2021 01/30/21   [name], MD  nitroGLYCERIN (NITROSTAT) 0.4 MG SL tablet Place 1 tablet (0.4 mg total) under the tongue every 5 (five) minutes as needed for chest pain. Patient not taking: Reported on 04/06/2021 07/30/18   Dettinger, Fransisca Kaufmann, MD  rivaroxaban (XARELTO) 20 MG TABS tablet Take 1  tablet (20 mg total) by mouth daily with supper. Patient not taking: Reported on 04/06/2021 02/16/21   [name], MD      Allergies    Penicillins, Bee venom, Prednisone, Amoxicillin-pot clavulanate, and Doxycycline hyclate    Review of Systems   Review of Systems  Genitourinary:  Positive for flank pain.   Physical Exam Updated Vital Signs BP 140/80    Pulse 60    Temp 98.2 F (36.8 C) (Oral)    Resp 14    Ht 5\' 6"  (1.676 m)    Wt 104.3 kg    SpO2 97%    BMI 37.12 kg/m  Physical Exam Vitals and nursing note reviewed.  Constitutional:       General: She is not in acute distress.    Appearance: She is not ill-appearing.  HENT:     Head: Atraumatic.  Eyes:     Conjunctiva/sclera: Conjunctivae normal.  Cardiovascular:     Rate and Rhythm: Normal rate and regular rhythm.     Pulses: Normal pulses.     Heart sounds: No murmur heard. Pulmonary:     Effort: Pulmonary effort is normal. No respiratory distress.     Breath sounds: Normal breath sounds.  Abdominal:     General: Abdomen is flat. There is no distension.     Palpations: Abdomen is soft.     Tenderness: There is no abdominal tenderness.     Comments: Abdomen soft, nondistended, nontender to palpation in all quadrants without guarding or peritoneal signs.  Negative CVA tenderness bilaterally   Musculoskeletal:        General: Normal range of motion.     Cervical back: Normal range of motion.       Back:     Comments: Focal reproducible tenderness of the left posterior rib cage approximately the level of the fifth rib.  No bony crepitus, bruising, deformity, erythema or induration.  Skin:    General: Skin is warm and dry.     Capillary Refill: Capillary refill takes less than 2 seconds.  Neurological:     General: No focal deficit present.     Mental Status: She is alert.  Psychiatric:        Mood and Affect: Mood normal.    ED Results / Procedures / Treatments   Labs (all labs ordered are listed, but only abnormal results are displayed) Labs Reviewed  URINALYSIS, ROUTINE W REFLEX MICROSCOPIC - Abnormal; Notable for the following components:      Result Value   Color, Urine COLORLESS (*)    Specific Gravity, Urine 1.003 (*)    Leukocytes,Ua TRACE (*)    All other components within normal limits  BASIC METABOLIC PANEL - Abnormal; Notable for the following components:   Glucose, Bld 112 (*)    All other components within normal limits  CBC WITH DIFFERENTIAL/PLATELET  HEPATIC FUNCTION PANEL  TROPONIN I (HIGH SENSITIVITY)    EKG None  Radiology DG  Ribs Unilateral W/Chest Left  Result Date: 04/06/2021 CLINICAL DATA:  Left chest pain EXAM: LEFT RIBS AND CHEST - 3+ VIEW COMPARISON:  01/03/2021 FINDINGS: Transverse diameter of heart is increased. There are no signs of pulmonary edema or focal pulmonary consolidation. There is no significant pleural effusion or pneumothorax. No displaced fractures are seen in the left ribs. Surgical clips are seen in the right upper quadrant. IMPRESSION: No displaced fractures are seen in the left ribs. There is no focal pulmonary consolidation. There is no pleural effusion or  pneumothorax. Electronically Signed   By: Elmer Picker M.D.   On: 04/06/2021 13:35   CT Renal Stone Study  Result Date: 04/06/2021 CLINICAL DATA:  A 66 year old female presents for evaluation of flank pain and suspected kidney stone. EXAM: CT ABDOMEN AND PELVIS WITHOUT CONTRAST TECHNIQUE: Multidetector CT imaging of the abdomen and pelvis was performed following the standard protocol without IV contrast. RADIATION DOSE REDUCTION: This exam was performed according to the departmental dose-optimization program which includes automated exposure control, adjustment of the mA and/or kV according to patient size and/or use of iterative reconstruction technique. COMPARISON:  December 27 2003. FINDINGS: Lower chest: No effusion. No consolidative changes. No pericardial effusion. Heart is incompletely imaged. Top normal heart size. Hepatobiliary: Lobular hepatic contours. Post cholecystectomy. Fissural widening of the liver with some signs suggest mild LEFT lobe hypertrophy. No gross biliary duct distension. Pancreas: Unremarkable without signs of inflammation or adjacent fluid. Contours are normal. Spleen: Normal. Adrenals/Urinary Tract: Adrenal glands are normal. Renal contours are smooth. No sign of nephrolithiasis, hydronephrosis or perinephric stranding. Urinary bladder is with smooth contours. No adjacent stranding. Stomach/Bowel: No signs of acute  bowel process. No bowel distension or perienteric stranding. Appendix not visualized, surgically absent by report. Vascular/Lymphatic: Aortic atherosclerosis. No sign of aneurysm. Smooth contour of the IVC. There is no gastrohepatic or hepatoduodenal ligament lymphadenopathy. No retroperitoneal or mesenteric lymphadenopathy. No pelvic sidewall lymphadenopathy. Atherosclerotic changes are moderate. Vascular structures with limited assessment due to lack of intravenous contrast. Reproductive: Bilateral serpiginous structures about the RIGHT and LEFT adnexa following hysterectomy. No surrounding stranding. Relatively symmetric appearance measuring approximately 1.5 cm greatest thickness at approximately 4 cm in greatest axial dimension. Other: No free air.  No ascites. Musculoskeletal: Patchy sclerosis of LEFT posterior iliac along the sacroiliac joint. Degenerative changes about the sacroiliac joints. Signs of prior fracture of the LEFT inferior pubic ramus. No destructive bone finding or acute bone process. Spinal degenerative changes. IMPRESSION: 1. No acute intra-abdominal process. 2. Bilateral serpiginous structures about the RIGHT and LEFT adnexa following hysterectomy. Relatively symmetric appearance measuring approximately 1.5 cm greatest thickness at approximately 4 cm in greatest axial dimension. No surrounding stranding. Findings may represent bilateral hydrosalpinx. Consider short interval follow-up given patient age with ultrasound on a nonemergent basis or comparison with prior imaging. 3. Lobular hepatic contours with some signs suggest mild LEFT lobe hypertrophy. Correlate with any clinical or laboratory evidence of liver disease. 4. LEFT greater than RIGHT sacroiliac degenerative changes with cortical thickening and trabecular thickening that shows an appearance that suggests the possibility of Paget's disease of the LEFT posterior iliac bone, unclear if this is simply reactive changes related to  adjacent arthropathy of the SI joint. Could consider short interval follow-up or bone scan as warranted. 5. Aortic atherosclerosis. Aortic Atherosclerosis (ICD10-I70.0). Electronically Signed   By: Zetta Bills M.D.   On: 04/06/2021 15:17    Procedures Procedures    Medications Ordered in ED Medications - No data to display  ED Course/ Medical Decision Making/ A&P                           Medical Decision Making Amount and/or Complexity of Data Reviewed Labs: ordered. Radiology: ordered.  Risk Prescription drug management.   History:  Per HPI Social determinants of health: None  Initial impression:  This patient presents to the ED for concern of left back/flank pain, this involves an extensive number of treatment options, and is a complaint that  carries with it a high risk of complications and morbidity.   Differentials include rib fracture, ACS, pyelonephritis, renal stone, costochondritis This is a 66 year old female in no acute distress, nontoxic-appearing.  Vitals are normal.  Physical exam benign aside from focal left posterior lateral rib cage tenderness.  Lungs CTA bilaterally.  Negative CVA tenderness.  Will obtain chest x-ray and rib study with basic labs.   Lab Tests and EKG:  I Ordered, reviewed, and interpreted labs and EKG.  The pertinent results include:  BMP and hepatic function normal Troponin normal CBC without leukocytosis or anemia Urinalysis without hematuria or infection    Imaging Studies ordered:  I ordered imaging studies including  Rib study and chest x-ray negative for fracture or pneumonia CT renal stone without acute findings I independently visualized and interpreted imaging and I agree with the radiologist interpretation.    Cardiac Monitoring:  The patient was maintained on a cardiac monitor.  I personally viewed and interpreted the cardiac monitored which showed an underlying rhythm of: NSR    ED Course: Given location of  patient's pain, will obtain and both renal stone study and rib/chest x-ray to rule out pulmonary or renal causes of her pain.  I was also concerned given her history of NSTEMI this could be an atypical presentation of cardiac pathology, however troponin was normal, EKG was also normal.  Patient symptoms are likely because from a muscular pathology given the reproducible nature.  Since this fall is along where her bra strap usually sits, this could be 1 possible reason.  Patient states that she does have frequent coughing and requests antibiotic.  Given that she has a normal chest x-ray, white cell count and is afebrile, we discussed that this does not help her symptoms.  I did offer her a course of Tessalon Perles for her symptoms.  Patient is amenable.  Disposition:  After consideration of the diagnostic results, physical exam, history and the patients response to treatment feel that the patent would benefit from discharge with outpatient follow-up.   Left rib pain Acute cough: Work-up reassuring.  Discussed all labs and imaging.  Return precautions were discussed.  Likely caused from muscular pathology.  Advised to follow-up outpatient with her PCP if symptoms not improved.  All questions were asked and answered and she was discharged home in good condition.  Final Clinical Impression(s) / ED Diagnoses Final diagnoses:  Rib pain on left side  Acute cough    Rx / DC Orders ED Discharge Orders          Ordered    benzonatate (TESSALON) 100 MG capsule  Every 8 hours        04/06/21 1646              Rodena Piety 04/06/21 1650    Milton Ferguson, MD 04/07/21 1659

## 2021-04-06 NOTE — Discharge Instructions (Addendum)
You had a pretty thorough work-up today which was all very reassuring.  Your heart, kidneys and abdomen look normal.  Your x-ray was negative for pneumonia or rib fractures.  I think your pain may be caused from a muscular pain or bruising, possibly from a bra strap.  Since you do have cough, I will send a prescription of a cough medication to help you, however an antibiotic is not can improve your symptoms since you do not have a bacterial infection.  If your pain does not improve within a week or so, please follow-up with your primary care doctor for reevaluation.

## 2021-04-06 NOTE — Telephone Encounter (Signed)
Patient called in complaining with left side pain at ribs, chest pain, shortness of breath, and feeling dizzy like she was going to pass out. Patient advised that she needs to call 911 and have them take her to ER for evaluation due to her symptoms. Patient is in agreement and verbalizes understanding.

## 2021-04-11 ENCOUNTER — Telehealth: Payer: Self-pay | Admitting: *Deleted

## 2021-04-11 NOTE — Telephone Encounter (Signed)
Ortho bundle 90 day call completed. 

## 2021-04-12 ENCOUNTER — Other Ambulatory Visit: Payer: Self-pay | Admitting: Family Medicine

## 2021-05-11 ENCOUNTER — Ambulatory Visit: Payer: Self-pay

## 2021-05-11 ENCOUNTER — Encounter: Payer: Self-pay | Admitting: Orthopaedic Surgery

## 2021-05-11 ENCOUNTER — Ambulatory Visit (INDEPENDENT_AMBULATORY_CARE_PROVIDER_SITE_OTHER): Payer: Medicare Other | Admitting: Orthopaedic Surgery

## 2021-05-11 VITALS — Ht 66.0 in | Wt 225.0 lb

## 2021-05-11 DIAGNOSIS — Z96651 Presence of right artificial knee joint: Secondary | ICD-10-CM

## 2021-05-11 DIAGNOSIS — M1712 Unilateral primary osteoarthritis, left knee: Secondary | ICD-10-CM | POA: Insufficient documentation

## 2021-05-11 DIAGNOSIS — G8929 Other chronic pain: Secondary | ICD-10-CM | POA: Diagnosis not present

## 2021-05-11 DIAGNOSIS — M25562 Pain in left knee: Secondary | ICD-10-CM | POA: Diagnosis not present

## 2021-05-11 NOTE — Progress Notes (Signed)
? ?Office Visit Note ?  ?Patient: Courtney Mcconnell           ?Date of Birth: 15-Jul-1955           ?MRN: [id number] ?Visit Date: 05/11/2021 ?             ?Requested by: Worthy Rancher, MD ?[address] ?[place],  [place] [id number] ?PCP: Dettinger, Fransisca Kaufmann, MD ? ? ?Assessment & Plan: ?Visit Diagnoses:  ?1. Chronic pain of left knee   ?2. Unilateral primary osteoarthritis, left knee   ?3.      Hx of DVT after right TKA     ? ?Plan: Patient may need preoperative clearance from Dr. Warrick Parisian for left total knee arthroplasty with her previous history of cardiac stent.  She would like to proceed with left total knee arthroplasty and she will need Xarelto after her left total knee arthroplasty with her history of postop DVT.  Procedure discussed therapy discussed again. ? ?Follow-Up Instructions: No follow-ups on file.  ? ?Orders:  ?Orders Placed This Encounter  ?Procedures  ? XR KNEE 3 VIEW LEFT  ? ?No orders of the defined types were placed in this encounter. ? ? ? ? Procedures: ?No procedures performed ? ? ?Clinical Data: ?No additional findings. ? ? ?Subjective: ?Chief Complaint  ?Patient presents with  ? Right Knee - Follow-up  ?  01/09/2021 Right TKA  ? Left Knee - Pain  ? ? ?HPI 66 year old female returns post right total knee arthroplasty 01/09/2021.  Right knee is doing well and her left osteoarthritic knee is giving her increased problems and she states she is ready to schedule the left at this time.  She is still using a cane due to her left knee has had previous anti-inflammatories intra-articular injections.  PCP is Dr. Warrick Parisian.  Patient's daughter is present with her today and both are happy with the results of the right total knee arthroplasty.  She had size 4 femur and tibia Attune.  ? ?Review of Systems history of seizure disorder coronary stent LAD, hyperlipidemia. history of DVT 2 weeks post right total knee arthroplasty in nonocclusive and calf veins. ? ? ?Objective: ?Vital Signs: Ht '5\' 6"'$  (1.676 m)    Wt 225 lb (102.1 kg)   BMI 36.32 kg/m?  ? ?Physical Exam ? ?Ortho Exam ? ?Specialty Comments:  ?No specialty comments available. ? ?Imaging: ?No results found. ? ? ?PMFS History: ?Patient Active Problem List  ? Diagnosis Date Noted  ? Unilateral primary osteoarthritis, left knee 05/11/2021  ? DVT (deep venous thrombosis) ([place]) 01/19/2021  ? S/P TKR (total knee replacement), right 01/09/2021  ? Rheumatoid arthritis with rheumatoid factor of multiple sites without organ or systems involvement ([place]) 12/02/2020  ? Coronary artery disease involving native coronary artery of native heart with angina pectoris ([facility]) 05/22/2019  ? Hyperlipidemia with target LDL less than 70 -> CAD 02/12/2019  ? Presence of drug coated stent in LAD coronary artery 02/11/2019  ? History of non-ST elevation myocardial infarction (NSTEMI) 02/10/2019  ? Cerebral infarction, remote, resolved 06/27/2018  ? Pulmonary nodules 12/08/2015  ? Sleep apnea 04/06/2015  ? Pulmonary emphysema ([facility]) 03/02/2015  ? Smoker 02/16/2015  ? Seizure disorder ([place]) 01/21/2015  ? Focal epilepsy with impairment of consciousness ([place]) 12/13/2014  ? Essential hypertension 04/18/2014  ? Preretinal fibrosis, 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  ? Tobacco use disorder 07/19/2012  ? ?Past Medical History:  ?Diagnosis Date  ? Asthma   ? Bursitis   ? Hip  ?  CAD (coronary artery disease) 02/11/2019  ? PCI/DES x1 to mLAD, focal ostial 70% lesion in 1st diag  ? COPD (chronic obstructive pulmonary disease) ([facility])   ? Essential hypertension   ? GERD (gastroesophageal reflux disease)   ? Headache(784.0)   ? Hyperlipidemia   ? NSTEMI (non-ST elevated myocardial infarction) ([facility]) 02/11/2019  ? Pneumonia   ? Seizure ([place])   ? Sleep apnea   ?  ?Family History  ?Problem Relation Age of Onset  ? COPD Mother   ? Diabetes Father   ? Hypertension Father   ? Hyperlipidemia Father   ? Cancer Sister   ?     Bone Cancer  ? Diabetes Brother   ? Cancer Brother   ? Cancer Paternal Uncle   ? Stroke Paternal  Grandmother   ? Healthy Daughter   ? Healthy Son   ? Colon cancer Neg Hx   ?  ?Past Surgical History:  ?Procedure Laterality Date  ? ABDOMINAL HYSTERECTOMY    ? APPENDECTOMY    ? CATARACT EXTRACTION Bilateral   ? CHOLECYSTECTOMY    ? CORONARY STENT INTERVENTION N/A 02/11/2019  ? Procedure: CORONARY STENT INTERVENTION;  Surgeon: Burnell Blanks, MD;  Location: [facility];; mid LAD 70% -- DES PCI (Synergy DES 2.5 x 28 --2.8 mm)  ? heal spur    ? right  ? INTRAVASCULAR PRESSURE WIRE/FFR STUDY N/A 02/11/2019  ? Procedure: INTRAVASCULAR PRESSURE WIRE/FFR STUDY;  Surgeon: Burnell Blanks, MD;  Location: [facility];  Service: Cardiovascular;  Laterality: N/A;  ? LASER PHOTO ABLATION Right 08/27/2013  ? Procedure: LASER PHOTO ABLATION;  Surgeon: Hayden Pedro, MD;  Location: [facility];  Service: Ophthalmology;  Laterality: Right;  Headscope laser AND Endolaser  ? LEFT HEART CATH AND CORONARY ANGIOGRAPHY N/A 02/11/2019  ? Procedure: LEFT HEART CATH AND CORONARY ANGIOGRAPHY;  Surgeon: Burnell Blanks, MD;  Location: [facility];;; prox RCA 20%. Ost-prox Cx 20%. ostial D1 70% & mLAD 70% => (DES PCI of LAD)  ? MEMBRANE PEEL Right 08/27/2013  ? Procedure: MEMBRANE PEEL;  Surgeon: Hayden Pedro, MD;  Location: [facility];  Service: Ophthalmology;  Laterality: Right;  ? PARS PLANA VITRECTOMY Right 08/27/2013  ? Procedure: PARS PLANA VITRECTOMY WITH 25 GAUGE;  Surgeon: Hayden Pedro, MD;  Location: [facility];  Service: Ophthalmology;  Laterality: Right;  ? TOTAL KNEE ARTHROPLASTY Right 01/09/2021  ? Procedure: RIGHT TOTAL KNEE ARTHROPLASTY;  Surgeon: [name], MD;  Location: [facility];  Service: Orthopedics;  Laterality: Right;  Needs RNFA  ? ?Social History  ? ?Occupational History  ? Occupation: COOK  ?  Employer: [employer]  ?Tobacco Use  ? Smoking status: Former  ?  Packs/day: 0.25  ?  Years: 42.00  ?  Pack years: 10.50  ?  Types: Cigarettes  ?  Quit date: 03/20/2014  ?  Years  since quitting: 7.1  ? Smokeless tobacco: Never  ? Tobacco comments:  ?  01/21/15 restarted smoking 4 mos ago  ?Vaping Use  ? Vaping Use: Never used  ?Substance and Sexual Activity  ? Alcohol use: No  ?  Alcohol/week: 0.0 standard drinks  ? Drug use: No  ? Sexual activity: Not on file  ? ? ? ? ? ? ?

## 2021-05-14 ENCOUNTER — Other Ambulatory Visit: Payer: Self-pay | Admitting: Cardiology

## 2021-05-25 ENCOUNTER — Other Ambulatory Visit: Payer: Self-pay | Admitting: Orthopaedic Surgery

## 2021-05-26 NOTE — Telephone Encounter (Signed)
I called. Request was an accident. Refill request denied. ?

## 2021-06-07 ENCOUNTER — Ambulatory Visit: Payer: Medicare Other | Admitting: Surgery

## 2021-06-07 NOTE — Progress Notes (Signed)
Surgical Instructions ? ? ? Your procedure is scheduled on Friday, May 5th. ? Report to [facility] Main Entrance "A" at 5:30 A.M., then check in with the Admitting office. ? Call this number if you have problems the morning of surgery: ? [phone] ? ? If you have any questions prior to your surgery date call [phone]: Open Monday-Friday 8am-4pm ? ? ? Remember: ? Do not eat after midnight the night before your surgery ? ?You may drink clear liquids until 4:30 AM the morning of your surgery.   ?Clear liquids allowed are: Water, Non-Citrus Juices (without pulp), Carbonated Beverages, Clear Tea, Black Coffee ONLY (NO MILK, CREAM OR POWDERED CREAMER of any kind), and Gatorade ? ?Please complete your PRE-SURGERY ENSURE that was provided to you by 4:30 AM the morning of surgery.  Please, if able, drink it in one setting. DO NOT SIP. ? ?  ? Take these medicines the morning of surgery with A SIP OF WATER:  ? Advair Inhaler ? Atorvastatin (Lipitor) ? Carvedilol (Coreg) ? Lacosamide (Vimpat) ? Methocarbamol (Robaxin) ? Omeprazole (Prilosec) ? Spiriva Inhaler ? ? If needed: ? Albuterol inhaler - bring with you on day of surgery ? ? ?As of today, STOP taking any Aspirin (unless otherwise instructed by your surgeon) Aleve, Naproxen, Ibuprofen, Motrin, Advil, Goody's, BC's, all herbal medications, fish oil, and all vitamins. ? ?         DAY OF SURGERY: ?Do not wear jewelry or makeup ?Do not wear lotions, powders, perfumes, or deodorant. ?Do not shave 48 hours prior to surgery.   ?Do not bring valuables to the hospital. ?Do not wear nail polish, gel polish, artificial nails, or any other type of covering on natural nails (fingers and toes) ?If you have artificial nails or gel coating that need to be removed by a nail salon, please have this removed prior to surgery. Artificial nails or gel coating may interfere with anesthesia's ability to adequately monitor your vital signs. ? ?[facility] is not responsible for any  belongings or valuables. .  ? ?Do NOT Smoke (Tobacco/Vaping)  24 hours prior to your procedure ? ?If you use a CPAP at night, you may bring your mask for your overnight stay. ?  ?Contacts, glasses, hearing aids, dentures or partials may not be worn into surgery, please bring cases for these belongings ?  ?For patients admitted to the hospital, discharge time will be determined by your treatment team. ?  ?Patients discharged the day of surgery will not be allowed to drive home, and someone needs to stay with them for 24 hours. ? ? ?SURGICAL WAITING ROOM VISITATION ?Patients having surgery or a procedure in a hospital may have two support people. ?Children under the age of 72 must have an adult with them who is not the patient. ?They may stay in the waiting area during the procedure and may switch out with other visitors. If the patient needs to stay at the hospital during part of their recovery, the visitor guidelines for inpatient rooms apply. ? ?Please refer to the [facility] website for the visitor guidelines for Inpatients (after your surgery is over and you are in a regular room).  ? ? ?Special instructions:   ? ?Oral Hygiene is also important to reduce your risk of infection.  Remember - BRUSH YOUR TEETH THE MORNING OF SURGERY WITH YOUR REGULAR TOOTHPASTE ? ? ?[facility]- Preparing For Surgery ? ?Before surgery, you can play an important role. Because skin is not sterile, your skin  needs to be as free of germs as possible. You can reduce the number of germs on your skin by washing with CHG (chlorahexidine gluconate) Soap before surgery.  CHG is an antiseptic cleaner which kills germs and bonds with the skin to continue killing germs even after washing.   ? ? ?Please do not use if you have an allergy to CHG or antibacterial soaps. If your skin becomes reddened/irritated stop using the CHG.  ?Do not shave (including legs and underarms) for at least 48 hours prior to first CHG shower. It is OK to shave your  face. ? ?Please follow these instructions carefully. ?  ? ? Shower the NIGHT BEFORE SURGERY and the MORNING OF SURGERY with CHG Soap.  ? If you chose to wash your hair, wash your hair first as usual with your normal shampoo. After you shampoo, rinse your hair and body thoroughly to remove the shampoo.  Then [employer] and genitals (private parts) with your normal soap and rinse thoroughly to remove soap. ? ?After that Use CHG Soap as you would any other liquid soap. You can apply CHG directly to the skin and wash gently with a scrungie or a clean washcloth.  ? ?Apply the CHG Soap to your body ONLY FROM THE NECK DOWN.  Do not use on open wounds or open sores. Avoid contact with your eyes, ears, mouth and genitals (private parts). Wash Face and genitals (private parts)  with your normal soap.  ? ?Wash thoroughly, paying special attention to the area where your surgery will be performed. ? ?Thoroughly rinse your body with warm water from the neck down. ? ?DO NOT shower/wash with your normal soap after using and rinsing off the CHG Soap. ? ?Pat yourself dry with a CLEAN TOWEL. ? ?Wear CLEAN PAJAMAS to bed the night before surgery ? ?Place CLEAN SHEETS on your bed the night before your surgery ? ?DO NOT SLEEP WITH PETS. ? ? ?Day of Surgery: ? ?Take a shower with CHG soap. ?Wear Clean/Comfortable clothing the morning of surgery ?Do not apply any deodorants/lotions.   ?Remember to brush your teeth WITH YOUR REGULAR TOOTHPASTE. ? ? ?Please read over the following fact sheets that you were given.  ? ?

## 2021-06-08 ENCOUNTER — Ambulatory Visit (HOSPITAL_COMMUNITY)
Admission: RE | Admit: 2021-06-08 | Discharge: 2021-06-08 | Disposition: A | Discharge status: Home / Self Care | Payer: Medicare Other | Source: Ambulatory Visit | Attending: Surgery | Admitting: Surgery

## 2021-06-08 ENCOUNTER — Telehealth: Payer: Self-pay | Admitting: *Deleted

## 2021-06-08 ENCOUNTER — Encounter (HOSPITAL_COMMUNITY): Payer: Self-pay

## 2021-06-08 ENCOUNTER — Encounter (HOSPITAL_COMMUNITY)
Admission: RE | Admit: 2021-06-08 | Discharge: 2021-06-08 | Disposition: A | Discharge status: Home / Self Care | Payer: Medicare Other | Source: Ambulatory Visit | Attending: Orthopaedic Surgery | Admitting: Orthopaedic Surgery

## 2021-06-08 ENCOUNTER — Ambulatory Visit (INDEPENDENT_AMBULATORY_CARE_PROVIDER_SITE_OTHER): Payer: Medicare Other | Admitting: Surgery

## 2021-06-08 ENCOUNTER — Telehealth: Payer: Self-pay | Admitting: Cardiology

## 2021-06-08 ENCOUNTER — Other Ambulatory Visit: Payer: Self-pay

## 2021-06-08 ENCOUNTER — Encounter: Payer: Self-pay | Admitting: Surgery

## 2021-06-08 VITALS — BP 162/78 | HR 62 | Temp 97.9°F | Resp 18 | Ht 66.0 in | Wt 226.2 lb

## 2021-06-08 VITALS — BP 157/88 | HR 77 | Ht 66.0 in | Wt 226.2 lb

## 2021-06-08 DIAGNOSIS — Z01818 Encounter for other preprocedural examination: Secondary | ICD-10-CM

## 2021-06-08 DIAGNOSIS — M1712 Unilateral primary osteoarthritis, left knee: Secondary | ICD-10-CM

## 2021-06-08 LAB — CBC
HCT: 39.9 % (ref 36.0–46.0)
Hemoglobin: 13 g/dL (ref 12.0–15.0)
MCH: 31 pg (ref 26.0–34.0)
MCHC: 32.6 g/dL (ref 30.0–36.0)
MCV: 95 fL (ref 80.0–100.0)
Platelets: 217 10*3/uL (ref 150–400)
RBC: 4.2 MIL/uL (ref 3.87–5.11)
RDW: 13.7 % (ref 11.5–15.5)
WBC: 8.4 10*3/uL (ref 4.0–10.5)
nRBC: 0 % (ref 0.0–0.2)

## 2021-06-08 LAB — BASIC METABOLIC PANEL
Anion gap: 5 (ref 5–15)
BUN: 11 mg/dL (ref 8–23)
CO2: 27 mmol/L (ref 22–32)
Calcium: 8.9 mg/dL (ref 8.9–10.3)
Chloride: 105 mmol/L (ref 98–111)
Creatinine, Ser: 0.65 mg/dL (ref 0.44–1.00)
GFR, Estimated: >60 mL/min (ref >60)
Glucose, Bld: 121 mg/dL — ABNORMAL HIGH (ref 70–99)
Potassium: 3.8 mmol/L (ref 3.5–5.1)
Sodium: 137 mmol/L (ref 135–145)

## 2021-06-08 LAB — SURGICAL PCR SCREEN
MRSA, PCR: NEGATIVE
Staphylococcus aureus: NEGATIVE

## 2021-06-08 NOTE — Telephone Encounter (Signed)
TELE PRE OP APPT 06/09/21 @ 3 PM. MED REC AND CONSENT ARE DONE.  ? ?  ?Patient Consent for Virtual Visit  ? ? ?   ? ?[name] has provided verbal consent on 06/08/2021 for a virtual visit (video or telephone). ? ? ?CONSENT FOR VIRTUAL VISIT FOR:  Courtney Mcconnell  ?By participating in this virtual visit I agree to the following: ? ?I hereby voluntarily request, consent and authorize [facility] and its employed or contracted physicians, physician assistants, nurse practitioners or other licensed health care professionals (the Practitioner), to provide me with telemedicine health care services (the ?Services") as deemed necessary by the treating Practitioner. I acknowledge and consent to receive the Services by the Practitioner via telemedicine. I understand that the telemedicine visit will involve communicating with the Practitioner through live audiovisual communication technology and the disclosure of certain medical information by electronic transmission. I acknowledge that I have been given the opportunity to request an in-person assessment or other available alternative prior to the telemedicine visit and am voluntarily participating in the telemedicine visit. ? ?I understand that I have the right to withhold or withdraw my consent to the use of telemedicine in the course of my care at any time, without affecting my right to future care or treatment, and that the Practitioner or I may terminate the telemedicine visit at any time. I understand that I have the right to inspect all information obtained and/or recorded in the course of the telemedicine visit and may receive copies of available information for a reasonable fee.  I understand that some of the potential risks of receiving the Services via telemedicine include:  ?Delay or interruption in medical evaluation due to technological equipment failure or disruption; ?Information transmitted may not be sufficient (e.g. poor resolution of images) to allow  for appropriate medical decision making by the Practitioner; and/or  ?In rare instances, security protocols could fail, causing a breach of personal health information. ? ?Furthermore, I acknowledge that it is my responsibility to provide information about my medical history, conditions and care that is complete and accurate to the best of my ability. I acknowledge that Practitioner's advice, recommendations, and/or decision may be based on factors not within their control, such as incomplete or inaccurate data provided by me or distortions of diagnostic images or specimens that may result from electronic transmissions. I understand that the practice of medicine is not an exact science and that Practitioner makes no warranties or guarantees regarding treatment outcomes. I acknowledge that a copy of this consent can be made available to me via my patient portal ([facility]), or I can request a printed copy by calling the office of [facility].   ? ?I understand that my insurance will be billed for this visit.  ? ?I have read or had this consent read to me. ?I understand the contents of this consent, which adequately explains the benefits and risks of the Services being provided via telemedicine.  ?I have been provided ample opportunity to ask questions regarding this consent and the Services and have had my questions answered to my satisfaction. ?I give my informed consent for the services to be provided through the use of telemedicine in my medical care ? ? ? ?

## 2021-06-08 NOTE — Progress Notes (Signed)
PCP - Vonna Kotyk Dettinger ?Cardiologist - Dr. Johnny Bridge  ? ?Chest x-ray - 06/08/21 ?EKG - 06/08/21 ?ECHO - 02/11/19 ?Cardiac Cath - 02/11/19 ? ?SA - yes, does not wear CPAP ? ?Aspirin Instructions: follow your surgeon's instructions on when to stop ASA ? ?ERAS Protcol - yes, Ensure given ?PRE-SURGERY Ensure or G2-  ? ? ?Anesthesia review: yes, heart history ?Last office visit with Dr. Domenic Polite was 12/22/20 (see chart note) - received cardiac clearance for right knee surgery.  Has not been back to Dr. Domenic Polite since right knee surgery.  Patient stated everything went well.   ? ?Patient denies shortness of breath, fever, cough and chest pain at PAT appointment ? ? ?All instructions explained to the patient, with a verbal understanding of the material. Patient agrees to go over the instructions while at home for a better understanding. Patient also instructed to self quarantine after being tested for COVID-19. The opportunity to ask questions was provided. ? ? ?

## 2021-06-08 NOTE — Telephone Encounter (Signed)
I called the pt, but s/w the pt's daughter who gave me another ph# to reach the pt ; [phone]. I will call pt on the number given to me.  ?

## 2021-06-08 NOTE — Progress Notes (Signed)
65 year old white female with a history of end-stage DJD left knee and pain comes in for preop evaluation.  States that symptoms unchanged from previous visit.  She is wanting to proceed with left total knee replacement as scheduled.  Today history and physical performed.  Review of systems negative.  Patient understands what is expected from procedure since she had right knee done previously.  We do not have an updated cardiac clearance.  I asked our surgery scheduler to send one out to her cardiologist. ?

## 2021-06-08 NOTE — Telephone Encounter (Signed)
? ? ?  Name: Courtney Mcconnell  ?DOB: [birth year]  ?MRN: [id number] ? ?Primary Cardiologist: Rozann Lesches, MD ? ? ?Preoperative team, please contact this patient and set up a phone call appointment for further preoperative risk assessment. Please obtain consent and complete medication review. Thank you for your help. ? ?I confirm that guidance regarding antiplatelet and oral anticoagulation therapy has been completed and, if necessary, noted below. ? ? ? ?Christell Faith, PA-C ?06/08/2021, 4:35 PM ?[facility] ?[address] ?[place], [place] [address] ? ? ?

## 2021-06-08 NOTE — Care Plan (Signed)
[facility] RNCM in person meeting with patient and her daughter today during her H&P appointment. She is planning to have a left total knee arthroplasty with Dr. Lorin Mercy on 06/16/21. She is an Ortho bundle patient through [facility] and is agreeable to case management. She will be staying with her daughter after surgery and discharge home, who will be assisting her. She has a RW and 3in1/BSC for home use. No DME needed. She would like an ice man ice machine this time to help with pain and swelling. Will make Dr. Lorin Mercy aware. Anticipate HHPT will be needed after a short hospital stay. Referral made to [facility] after choice provided. Patient would like to go to [facility] there in [place] when appropriate. CM will assist with getting this scheduled. Reviewed post op care instructions and copy provided to patient. Will continue to follow for needs. ?

## 2021-06-08 NOTE — Telephone Encounter (Signed)
? ?  Pre-operative Risk Assessment  ?  ?Patient Name: Courtney Mcconnell  ?DOB: 03-12-1955 ?MRN: [id number]{ ?  ? ?Request for Surgical Clearance   ? ?Procedure:   LEFT TOTAL KNEE ARTHROPLASTY ? ?Date of Surgery:  06/16/21                              ?   ?Surgeon:  Dr. Rodell Perna ?Surgeon's Group or Practice Name:  Environmental education officer at [facility] ?Phone number:  [phone] ?Fax number:  [phone] ?  ?Type of Clearance Requested:   ?- Medical  ?  ?Type of Anesthesia:  General  ?  ?Additional requests/questions:  Please fax a copy of clearance to the surgeon's office. @ [phone] ? ?Signed, ?Desma Paganini   ?06/08/2021, 4:25 PM  ? ?

## 2021-06-08 NOTE — Telephone Encounter (Signed)
TELE PRE OP APPT 06/09/21 @ 3 PM. MED REC AND CONSENT ARE DONE.  ?

## 2021-06-08 NOTE — Telephone Encounter (Signed)
Ortho bundle Pre-op in person meeting completed for patient's upcoming Left total knee arthroplasty.  ?

## 2021-06-09 ENCOUNTER — Ambulatory Visit (INDEPENDENT_AMBULATORY_CARE_PROVIDER_SITE_OTHER): Payer: Medicare Other | Admitting: Physician Assistant

## 2021-06-09 DIAGNOSIS — Z01810 Encounter for preprocedural cardiovascular examination: Secondary | ICD-10-CM

## 2021-06-09 NOTE — Progress Notes (Signed)
? ?Virtual Visit via Telephone Note  ? ?This visit type was conducted due to national recommendations for restrictions regarding the COVID-19 Pandemic (e.g. social distancing) in an effort to limit this patient's exposure and mitigate transmission in our community.  Due to her co-morbid illnesses, this patient is at least at moderate risk for complications without adequate follow up.  This format is felt to be most appropriate for this patient at this time.  The patient did not have access to video technology/had technical difficulties with video requiring transitioning to audio format only (telephone).  All issues noted in this document were discussed and addressed.  No physical exam could be performed with this format.  Please refer to the patient's chart for her  consent to telehealth for [facility]. ? ?Evaluation Performed:  Preoperative cardiovascular risk assessment ?_____________  ? ?Date:  06/09/2021  ? ?Patient ID:  Courtney Mcconnell, Courtney Mcconnell 1955-03-30, MRN [id number] ?Patient Location:  ?Home ?Provider location:   ?Office ? ?Primary Care Provider:  Dettinger, Fransisca Kaufmann, MD ?Primary Cardiologist:  Rozann Lesches, MD ? ?Chief Complaint  ?  ?66 y.o. y/o female with a h/o CAD s/p PCI/DES to the mid LAD in 2020, HTN, HLD, COPD, and sleep apnea, who is pending left TKA on 06/16/2021, and presents today for telephonic preoperative cardiovascular risk assessment. ? ?Past Medical History  ?  ?Past Medical History:  ?Diagnosis Date  ? Asthma   ? Bursitis   ? Hip  ? CAD (coronary artery disease) 02/11/2019  ? PCI/DES x1 to mLAD, focal ostial 70% lesion in 1st diag  ? COPD (chronic obstructive pulmonary disease) ([place])   ? Essential hypertension   ? GERD (gastroesophageal reflux disease)   ? Headache(784.0)   ? Hyperlipidemia   ? NSTEMI (non-ST elevated myocardial infarction) ([place]) 02/11/2019  ? Pneumonia   ? Seizure ([place])   ? Sleep apnea   ? ?Past Surgical History:  ?Procedure Laterality Date  ? ABDOMINAL HYSTERECTOMY    ?  APPENDECTOMY    ? CATARACT EXTRACTION Bilateral   ? CHOLECYSTECTOMY    ? CORONARY STENT INTERVENTION N/A 02/11/2019  ? Procedure: CORONARY STENT INTERVENTION;  Surgeon: Burnell Blanks, MD;  Location: [place] [facility];; mid LAD 70% -- DES PCI (Synergy DES 2.5 x 28 --2.8 mm)  ? heal spur    ? right  ? INTRAVASCULAR PRESSURE WIRE/FFR STUDY N/A 02/11/2019  ? Procedure: INTRAVASCULAR PRESSURE WIRE/FFR STUDY;  Surgeon: Burnell Blanks, MD;  Location: [place] [facility];  Service: Cardiovascular;  Laterality: N/A;  ? LASER PHOTO ABLATION Right 08/27/2013  ? Procedure: LASER PHOTO ABLATION;  Surgeon: Hayden Pedro, MD;  Location: [place];  Service: Ophthalmology;  Laterality: Right;  Headscope laser AND Endolaser  ? LEFT HEART CATH AND CORONARY ANGIOGRAPHY N/A 02/11/2019  ? Procedure: LEFT HEART CATH AND CORONARY ANGIOGRAPHY;  Surgeon: Burnell Blanks, MD;  Location: [place] [facility];;; prox RCA 20%. Ost-prox Cx 20%. ostial D1 70% & mLAD 70% => (DES PCI of LAD)  ? MEMBRANE PEEL Right 08/27/2013  ? Procedure: MEMBRANE PEEL;  Surgeon: Hayden Pedro, MD;  Location: [place];  Service: Ophthalmology;  Laterality: Right;  ? PARS PLANA VITRECTOMY Right 08/27/2013  ? Procedure: PARS PLANA VITRECTOMY WITH 25 GAUGE;  Surgeon: Hayden Pedro, MD;  Location: [place];  Service: Ophthalmology;  Laterality: Right;  ? TOTAL KNEE ARTHROPLASTY Right 01/09/2021  ? Procedure: RIGHT TOTAL KNEE ARTHROPLASTY;  Surgeon: [name], MD;  Location: [place];  Service: Orthopedics;  Laterality: Right;  Needs RNFA  ? ? ?Allergies ? ?Allergies  ?Allergen Reactions  ? Penicillins Hives  ? Bee Venom Swelling  ? Prednisone Other (See Comments)  ?  Patient did not like how it made her feel, caused jittery feeling.   ? Augmentin [Amoxicillin-Pot Clavulanate] Rash  ? Doxycycline Hyclate Itching and Rash  ? ? ?History of Present Illness  ?  ?Courtney Mcconnell is a 66 y.o. female who presents via audio/video conferencing for a telehealth  visit today.  Pt was last seen in cardiology clinic on 12/22/2020 by Dr. Domenic Polite.  At that time Courtney Mcconnell was doing well, without symptoms of angina or decompensation.  That visit was for preoperative cardiac risk stratification for right TKA, and at that time, she was felt to be low risk for noncardiac surgery.  She subsequently underwent right TKA with postoperative course notable for a nonocclusive thrombus in the right posterior tibial, peroneal, gastrocnemial, and lesser saphenous veins, s/p Xarelto.  The patient is now pending left TKA, scheduled for 06/16/2021.  Since her last visit, she has done well and been without symptoms of angina or decompensation.  She remains active at baseline with her limiting factor at this time being the left knee.  ? ? ?Home Medications  ?  ?Prior to Admission medications   ?Medication Sig Start Date End Date Taking? Authorizing Provider  ?ADVAIR DISKUS 100-50 MCG/DOSE AEPB inhale ONE PUFF into THE lungs IN THE MORNING AND IN THE EVENING ?Patient taking differently: Inhale 1 puff into the lungs 2 (two) times daily. 05/16/20   Dettinger, Fransisca Kaufmann, MD  ?albuterol (VENTOLIN HFA) 108 (90 Base) MCG/ACT inhaler INHALE TWO PUFFS INTO THE LUNGS EVERY 6 HOURS AS NEEDED FOR WHEEZING OR SHORTNESS OF BREATH 06/10/20   Dettinger, Fransisca Kaufmann, MD  ?aspirin EC 81 MG tablet Take 81 mg by mouth daily. Swallow whole.    [provider]  ?atorvastatin (LIPITOR) 40 MG tablet TAKE 1 TABLET BY MOUTH DAILY AT [facility] 12/22/20   Satira Sark, MD  ?benzonatate (TESSALON) 100 MG capsule Take 1 capsule (100 mg total) by mouth every 8 (eight) hours. ?Patient not taking: Reported on 06/02/2021 04/06/21   Kathe Becton R, PA-C  ?carvedilol (COREG) 3.125 MG tablet TAKE 1 TABLET BY MOUTH TWICE DAILY WITH A MEAL 11/14/20   Leonie Man, MD  ?diclofenac Sodium (VOLTAREN) 1 % GEL Apply 2 g topically 4 (four) times daily. ?Patient taking differently: Apply 2 g topically 2 (two) times daily as needed  (knee pain). 04/14/20   Dettinger, Fransisca Kaufmann, MD  ?Lacosamide (VIMPAT) 150 MG TABS Take 300 mg by mouth 2 (two) times daily. 11/02/19   [provider]  ?losartan (COZAAR) 25 MG tablet TAKE 1 TABLET BY MOUTH EVERY DAY 05/15/21   Leonie Man, MD  ?methocarbamol (ROBAXIN) 500 MG tablet TAKE 1 TABLET BY MOUTH EVERY 8 HOURS AS NEEDED FOR FOR MUSCLE SPASMS 01/30/21   [name], MD  ?nitroGLYCERIN (NITROSTAT) 0.4 MG SL tablet Place 1 tablet (0.4 mg total) under the tongue every 5 (five) minutes as needed for chest pain. (NEEDS TO BE SEEN BEFORE NEXT REFILL) 04/12/21   Dettinger, Fransisca Kaufmann, MD  ?omeprazole (PRILOSEC OTC) 20 MG tablet Take 20 mg by mouth 2 (two) times daily.    [provider]  ?rivaroxaban (XARELTO) 20 MG TABS tablet Take 1 tablet (20 mg total) by mouth daily with supper. ?Patient not taking: Reported on 04/06/2021 02/16/21  [name], MD  ?SPIRIVA HANDIHALER 18 MCG inhalation capsule INHALE 1 PUFF BY MOUTH DAILY ?Patient taking differently: Place 18 mcg into inhaler and inhale daily. 11/14/20   Dettinger, Fransisca Kaufmann, MD  ?zonisamide (ZONEGRAN) 100 MG capsule Take 200 mg by mouth at bedtime. 07/18/20   [provider]  ? ? ?Physical Exam  ?  ?Vital Signs:  Inola W Rutan does not have vital signs available for review today. ? ?Given telephonic nature of communication, physical exam is limited. ?AAOx3. NAD. Normal affect.  Speech and respirations are unlabored. ? ?Accessory Clinical Findings  ?  ?None ? ?Assessment & Plan  ?  ?1.  Preoperative Cardiovascular Risk Assessment: The patient affirms she has been doing well without any new cardiac symptoms. They are able to achieve > 4 METS without cardiac limitations. RCRI: low risk for noncardiac surgery.  Therefore, based on ACC/AHA guidelines, the patient would be at acceptable risk for the planned procedure without further cardiovascular testing. The patient was advised that if she develops new symptoms prior to surgery to contact our  office to arrange for a follow-up visit, and she verbalized understanding. ? ? ?A copy of this note will be routed to requesting surgeon. ? ?Time:   ?Today, I have spent 5 minutes with the patient with

## 2021-06-09 NOTE — Anesthesia Preprocedure Evaluation (Addendum)
Anesthesia Evaluation  ?Patient identified by MRN, date of birth, ID band ?Patient awake ? ? ? ?Reviewed: ?Allergy & Precautions, NPO status , Patient's Chart, lab work & pertinent test results ? ?History of Anesthesia Complications ?Negative for: history of anesthetic complications ? ?Airway ?Mallampati: III ? ?TM Distance: >3 FB ?Neck ROM: Full ? ? ? Dental ? ?(+) Edentulous Upper, Edentulous Lower ?  ?Pulmonary ?asthma , sleep apnea , COPD,  COPD inhaler, former smoker,  ?  ?breath sounds clear to auscultation ? ? ? ? ? ? Cardiovascular ?hypertension, Pt. on medications and Pt. on home beta blockers ?(-) angina+ CAD, + Past MI and + Cardiac Stents  ?(-) CHF (-) dysrhythmias  ?Rhythm:Regular  ??1. Left ventricular ejection fraction, by visual estimation, is 55 to  ?60%. The left ventricle has normal function. There is no left ventricular  ?hypertrophy.  ??2. Left ventricular diastolic parameters are consistent with Grade I  ?diastolic dysfunction (impaired relaxation).  ??3. The left ventricle has no regional wall motion abnormalities.  ??4. Global right ventricle has normal systolic function.The right  ?ventricular size is normal. No increase in right ventricular wall  ?thickness.  ??5. Left atrial size was normal.  ??6. Right atrial size was normal.  ??7. Presence of pericardial fat pad.  ??8. Trivial pericardial effusion is present.  ??9. The mitral valve is grossly normal. Trivial mitral valve  ?regurgitation.  ?10. The tricuspid valve is grossly normal.  ?11. The aortic valve is grossly normal. Aortic valve regurgitation is not  ?visualized. No evidence of aortic valve sclerosis or stenosis.  ?12. The pulmonic valve was grossly normal. Pulmonic valve regurgitation is  ?not visualized.  ?13. There is mild dilatation of the ascending aorta measuring 40 mm.  ?14. TR signal is inadequate for assessing pulmonary artery systolic  ?pressure.  ?15. The inferior vena cava is normal in  size with greater than 50%  ?respiratory variability, suggesting right atrial pressure of 3 mmHg.  ?16. No prior Echocardiogram.  ?17. The average left ventricular global longitudinal strain is -16.3 %.  ? ?? Prox RCA lesion is 20% stenosed. ?? Ost Cx to Prox Cx lesion is 20% stenosed. ?? 1st Diag lesion is 70% stenosed. ?? Mid LAD lesion is 80% stenosed. ?? A drug-eluting stent was successfully placed using a SYNERGY XD 2.50X28. ?? Post intervention, there is a 0% residual stenosis. ?  ?1. Severe mid LAD stenosis.  ?2. Successful PTCA/DES x 1 mid LAD ?3. Moderately severe stenosis in the ostium of the moderate caliber diagonal branch.  ?4. Mild non-obstructive disease in the RCA and Circumflex ?? ?Recommendations: DAPT with ASA and Brilinta for one year. High intensity statin and beta blocker.  ? ?  ?Neuro/Psych ? Headaches, Seizures -, Well Controlled,  negative psych ROS  ? GI/Hepatic ?Neg liver ROS, GERD  Medicated and Controlled,  ?Endo/Other  ?negative endocrine ROS ? Renal/GU ?negative Renal ROSLab Results ?     Component                Value               Date                 ?     CREATININE               0.65                06/08/2021           ?  ? ?  ?  Musculoskeletal ? ?(+) Arthritis ,  ? Abdominal ?  ?Peds ? Hematology ?negative hematology ROS ?(+) Lab Results ?     Component                Value               Date                 ?     WBC                      8.4                 06/08/2021           ?     HGB                      13.0                06/08/2021           ?     HCT                      39.9                06/08/2021           ?     MCV                      95.0                06/08/2021           ?     PLT                      217                 06/08/2021           ?   ?Anesthesia Other Findings ? ? Reproductive/Obstetrics ? ?  ? ? ? ? ? ? ? ? ? ? ? ? ? ?  ?  ? ? ? ? ? ? ?Please refer to progress note 06/09/21 for Anesthesia Chart Review. Junie Bame, DNP, CRNA, NP-C ?Short  Stay/Anesthesia ? ?Anesthesia Physical ?Anesthesia Plan ? ?ASA: 3 ? ?Anesthesia Plan: General  ? ?Post-op Pain Management: Regional block*  ? ?Induction: Intravenous ? ?PONV Risk Score and Plan: 3 and Ondansetron and Dexamethasone ? ?Airway Management Planned: LMA and Oral ETT ? ?Additional Equipment: None ? ?Intra-op Plan:  ? ?Post-operative Plan: Extubation in OR ? ?Informed Consent: I have reviewed the patients History and Physical, chart, labs and discussed the procedure including the risks, benefits and alternatives for the proposed anesthesia with the patient or authorized representative who has indicated his/her understanding and acceptance.  ? ? ? ?Dental advisory given ? ?Plan Discussed with: CRNA ? ?Anesthesia Plan Comments:   ? ? ? ? ? ? ?Anesthesia Quick Evaluation ? ?

## 2021-06-09 NOTE — Progress Notes (Signed)
Anesthesia Chart Review:  ? ? Case: [id number] Date/Time: 06/16/21 0715  ? Procedure: LEFT TOTAL KNEE ARTHROPLASTY (Left: Knee)  ? Anesthesia type: General  ? Pre-op diagnosis: OSTEOARTHRITIS LEFT KNEE  ? Location: [facility] OR ROOM 06 / [place] [facility]  ? Surgeons: [name], MD  ? ?  ? ?DISCUSSION: ?Courtney Mcconnell is a 66 yo female with a PMHx of CAD s/p DES to LAD 01/2019, HTN, COPD, former smoker,  HLD and OSA.  ? ?She is followed by cardiologist Dr. Domenic Polite for which she was last seen for surgical clearance of right TKA on 12/22/2020.  Per note, "Preoperative cardiac evaluation in a 66 year old woman with a history of CAD, hypertension, and hyperlipidemia.  She is being considered for right total knee arthroplasty with Dr. Lorin Mercy.  RCRI cardiac risk calculator indicates class II, 0.9% chance of major adverse cardiac event in the perioperative setting.  She underwent ischemic testing in August of this year which was low risk.  She should be able to proceed with planned surgery at relatively low risk without further cardiac testing." ? ?Post R TKA patient had DVT and was treated with Xarelto which she is no longer on.  ? ?Seen in the ED 04/06/21 for L back flank pain and had full workup. Troponin normal and EKG normal.  ? ?Former smoker. COPD maintained on Spiriva and Advair. ?  ? ?VS:  ? ?  06/08/2021  ? 10:11 AM 06/08/2021  ?  8:02 AM 05/11/2021  ? 10:24 AM  ?Vitals with BMI  ?Height '5\' 6"'$  '5\' 6"'$  '5\' 6"'$   ?Weight 226 lbs 3 oz 226 lbs 3 oz 225 lbs  ?BMI 36.53 36.53 36.33  ?Systolic 237 628   ?Diastolic 88 78   ?Pulse 77 62   ? ? ?PROVIDERS: ?Dettinger, Fransisca Kaufmann, MD ? ? ?LABS:  ?Preop labs reviewed and unremarkable. No diagnosis of DM 2, however A1C has been elevated in the past, 6.5 on 10/27/20. ? ?EKG: ?06/08/21: ?NSR 61 BPM, incomplete RBBB ? ?CV: ? ?Lexiscan Myoview 10/12/2020: ?  Findings are consistent with no significant ischemia. The study is low risk. ?  No ST deviation was noted. ?  Defect 1: There is a small defect with mild  reduction in uptake present in the apical anteroseptal location(s) that is fixed. There is normal wall motion in the defect area. Consistent with artifact caused by breast attenuation. ?  Left ventricular function is normal. End diastolic cavity size is normal. End systolic cavity size is normal. ?  ?Low risk study with LVEF 66%.  Breast attenuation artifact noted without significant ischemic territories. ?  ?Cath and PCI 02/11/2019: ?1. Severe mid LAD stenosis.  ?2. Successful PTCA/DES x 1 mid LAD ?3. Moderately severe stenosis in the ostium of the moderate caliber diagonal branch.  ?4. Mild non-obstructive disease in the RCA and Circumflex ?  ?Recommendations: DAPT with ASA and Brilinta for one year. High intensity statin and beta blocker.  ?  ?TTE 02/11/2019: ? 1. Left ventricular ejection fraction, by visual estimation, is 55 to  ?60%. The left ventricle has normal function. There is no left ventricular  ?hypertrophy.  ? 2. Left ventricular diastolic parameters are consistent with Grade I  ?diastolic dysfunction (impaired relaxation).  ? 3. The left ventricle has no regional wall motion abnormalities.  ? 4. Global right ventricle has normal systolic function.The right  ?ventricular size is normal. No increase in right ventricular wall  ?thickness.  ? 5. Left atrial size was normal.  ?  6. Right atrial size was normal.  ? 7. Presence of pericardial fat pad.  ? 8. Trivial pericardial effusion is present.  ? 9. The mitral valve is grossly normal. Trivial mitral valve  ?regurgitation.  ?10. The tricuspid valve is grossly normal.  ?11. The aortic valve is grossly normal. Aortic valve regurgitation is not  ?visualized. No evidence of aortic valve sclerosis or stenosis.  ?12. The pulmonic valve was grossly normal. Pulmonic valve regurgitation is  ?not visualized.  ?13. There is mild dilatation of the ascending aorta measuring 40 mm.  ?14. TR signal is inadequate for assessing pulmonary artery systolic  ?pressure.  ?15.  The inferior vena cava is normal in size with greater than 50%  ?respiratory variability, suggesting right atrial pressure of 3 mmHg.  ?16. No prior Echocardiogram.  ?17. The average left ventricular global longitudinal strain is -16.3 %.  ? ?Past Medical History:  ?Diagnosis Date  ? Asthma   ? Bursitis   ? Hip  ? CAD (coronary artery disease) 02/11/2019  ? PCI/DES x1 to mLAD, focal ostial 70% lesion in 1st diag  ? COPD (chronic obstructive pulmonary disease) ([place])   ? Essential hypertension   ? GERD (gastroesophageal reflux disease)   ? Headache(784.0)   ? Hyperlipidemia   ? NSTEMI (non-ST elevated myocardial infarction) ([place]) 02/11/2019  ? Pneumonia   ? Seizure ([place])   ? Sleep apnea   ? ? ?Past Surgical History:  ?Procedure Laterality Date  ? ABDOMINAL HYSTERECTOMY    ? APPENDECTOMY    ? CATARACT EXTRACTION Bilateral   ? CHOLECYSTECTOMY    ? CORONARY STENT INTERVENTION N/A 02/11/2019  ? Procedure: CORONARY STENT INTERVENTION;  Surgeon: Burnell Blanks, MD;  Location: [place] CV LAB;; mid LAD 70% -- DES PCI (Synergy DES 2.5 x 28 --2.8 mm)  ? heal spur    ? right  ? INTRAVASCULAR PRESSURE WIRE/FFR STUDY N/A 02/11/2019  ? Procedure: INTRAVASCULAR PRESSURE WIRE/FFR STUDY;  Surgeon: Burnell Blanks, MD;  Location: [place] [facility];  Service: Cardiovascular;  Laterality: N/A;  ? LASER PHOTO ABLATION Right 08/27/2013  ? Procedure: LASER PHOTO ABLATION;  Surgeon: Hayden Pedro, MD;  Location: [place];  Service: Ophthalmology;  Laterality: Right;  Headscope laser AND Endolaser  ? LEFT HEART CATH AND CORONARY ANGIOGRAPHY N/A 02/11/2019  ? Procedure: LEFT HEART CATH AND CORONARY ANGIOGRAPHY;  Surgeon: Burnell Blanks, MD;  Location: [place] INVASIVE CV LAB;;; prox RCA 20%. Ost-prox Cx 20%. ostial D1 70% & mLAD 70% => (DES PCI of LAD)  ? MEMBRANE PEEL Right 08/27/2013  ? Procedure: MEMBRANE PEEL;  Surgeon: Hayden Pedro, MD;  Location: [place];  Service: Ophthalmology;  Laterality: Right;  ? PARS PLANA  VITRECTOMY Right 08/27/2013  ? Procedure: PARS PLANA VITRECTOMY WITH 25 GAUGE;  Surgeon: Hayden Pedro, MD;  Location: [place];  Service: Ophthalmology;  Laterality: Right;  ? TOTAL KNEE ARTHROPLASTY Right 01/09/2021  ? Procedure: RIGHT TOTAL KNEE ARTHROPLASTY;  Surgeon: [name], MD;  Location: [place];  Service: Orthopedics;  Laterality: Right;  Needs RNFA  ? ? ?MEDICATIONS: ?No current facility-administered medications for this encounter.  ? ? ADVAIR DISKUS 100-50 MCG/DOSE AEPB  ? albuterol (VENTOLIN HFA) 108 (90 Base) MCG/ACT inhaler  ? aspirin EC 81 MG tablet  ? atorvastatin (LIPITOR) 40 MG tablet  ? carvedilol (COREG) 3.125 MG tablet  ? diclofenac Sodium (VOLTAREN) 1 % GEL  ? Lacosamide (VIMPAT) 150 MG TABS  ? losartan (COZAAR) 25 MG tablet  ?  methocarbamol (ROBAXIN) 500 MG tablet  ? nitroGLYCERIN (NITROSTAT) 0.4 MG SL tablet  ? omeprazole (PRILOSEC OTC) 20 MG tablet  ? SPIRIVA HANDIHALER 18 MCG inhalation capsule  ? zonisamide (ZONEGRAN) 100 MG capsule  ? benzonatate (TESSALON) 100 MG capsule  ? rivaroxaban (XARELTO) 20 MG TABS tablet  ? ? ? ?Junie Bame, DNP, CRNA, NP-C ?Short Stay/Anesthesia ? ? ? ? ? ?

## 2021-06-13 NOTE — H&P (Signed)
TOTAL KNEE ADMISSION H&P ? ?Patient is being admitted for left total knee arthroplasty. ? ?Subjective: ? ?Chief Complaint:left knee pain. ? ?66 year old white female with a history of end-stage DJD left knee and pain comes in for preop evaluation.  States that symptoms unchanged from previous visit.  She is wanting to proceed with left total knee replacement as scheduled.  Today history and physical performed.  Review of systems negative.  Patient understands what is expected from procedure since she had right knee done previously.  We do not have an updated cardiac clearance.  I asked our surgery scheduler to send one out to her cardiologist ? ?HPI: Courtney Mcconnell, 66 y.o. female, has a history of pain and functional disability in the left knee due to arthritis and has failed non-surgical conservative treatments for greater than 12 weeks to includeNSAID's and/or analgesics, corticosteriod injections, use of assistive devices, and activity modification.  Onset of symptoms was gradual, starting 10 years ago with gradually worsening course since that time.   Patient currently rates pain in the left knee(s) at 10 out of 10 with activity. Patient has night pain, worsening of pain with activity and weight bearing, pain that interferes with activities of daily living, pain with passive range of motion, crepitus, and joint swelling.  Patient has evidence of subchondral cysts, periarticular osteophytes, and joint space narrowing by imaging studies. There is no active infection. ? ?Patient Active Problem List  ? Diagnosis Date Noted  ? Unilateral primary osteoarthritis, left knee 05/11/2021  ? DVT (deep venous thrombosis) ([place]) 01/19/2021  ? S/P TKR (total knee replacement), right 01/09/2021  ? Rheumatoid arthritis with rheumatoid factor of multiple sites without organ or systems involvement ([place]) 12/02/2020  ? Coronary artery disease involving native coronary artery of native heart with angina pectoris ([place]) 05/22/2019  ?  Hyperlipidemia with target LDL less than 70 -> CAD 02/12/2019  ? Presence of drug coated stent in LAD coronary artery 02/11/2019  ? History of non-ST elevation myocardial infarction (NSTEMI) 02/10/2019  ? Cerebral infarction, remote, resolved 06/27/2018  ? Pulmonary nodules 12/08/2015  ? Sleep apnea 04/06/2015  ? Pulmonary emphysema ([place]) 03/02/2015  ? Smoker 02/16/2015  ? Seizure disorder ([place]) 01/21/2015  ? Focal epilepsy with impairment of consciousness ([place]) 12/13/2014  ? Essential hypertension 04/18/2014  ? Preretinal fibrosis, 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  ? Tobacco use disorder 07/19/2012  ? ?Past Medical History:  ?Diagnosis Date  ? Asthma   ? Bursitis   ? Hip  ? CAD (coronary artery disease) 02/11/2019  ? PCI/DES x1 to mLAD, focal ostial 70% lesion in 1st diag  ? COPD (chronic obstructive pulmonary disease) ([place])   ? Essential hypertension   ? GERD (gastroesophageal reflux disease)   ? Headache(784.0)   ? Hyperlipidemia   ? NSTEMI (non-ST elevated myocardial infarction) ([place]) 02/11/2019  ? Pneumonia   ? Seizure ([place])   ? Sleep apnea   ?  ?Past Surgical History:  ?Procedure Laterality Date  ? ABDOMINAL HYSTERECTOMY    ? APPENDECTOMY    ? CATARACT EXTRACTION Bilateral   ? CHOLECYSTECTOMY    ? CORONARY STENT INTERVENTION N/A 02/11/2019  ? Procedure: CORONARY STENT INTERVENTION;  Surgeon: Burnell Blanks, MD;  Location: [place] [facility];; mid LAD 70% -- DES PCI (Synergy DES 2.5 x 28 --2.8 mm)  ? heal spur    ? right  ? INTRAVASCULAR PRESSURE WIRE/FFR STUDY N/A 02/11/2019  ? Procedure: INTRAVASCULAR PRESSURE WIRE/FFR STUDY;  Surgeon: Burnell Blanks, MD;  Location: [facility]  INVASIVE [facility];  Service: Cardiovascular;  Laterality: N/A;  ? LASER PHOTO ABLATION Right 08/27/2013  ? Procedure: LASER PHOTO ABLATION;  Surgeon: Hayden Pedro, MD;  Location: [place];  Service: Ophthalmology;  Laterality: Right;  Headscope laser AND Endolaser  ? LEFT HEART CATH AND CORONARY ANGIOGRAPHY N/A 02/11/2019  ? Procedure: LEFT  HEART CATH AND CORONARY ANGIOGRAPHY;  Surgeon: Burnell Blanks, MD;  Location: [place] INVASIVE CV LAB;;; prox RCA 20%. Ost-prox Cx 20%. ostial D1 70% & mLAD 70% => (DES PCI of LAD)  ? MEMBRANE PEEL Right 08/27/2013  ? Procedure: MEMBRANE PEEL;  Surgeon: Hayden Pedro, MD;  Location: [place];  Service: Ophthalmology;  Laterality: Right;  ? PARS PLANA VITRECTOMY Right 08/27/2013  ? Procedure: PARS PLANA VITRECTOMY WITH 25 GAUGE;  Surgeon: Hayden Pedro, MD;  Location: [place];  Service: Ophthalmology;  Laterality: Right;  ? TOTAL KNEE ARTHROPLASTY Right 01/09/2021  ? Procedure: RIGHT TOTAL KNEE ARTHROPLASTY;  Surgeon: [name], MD;  Location: [place];  Service: Orthopedics;  Laterality: Right;  Needs RNFA  ?  ?No current facility-administered medications for this encounter.  ? ?Current Outpatient Medications  ?Medication Sig Dispense Refill Last Dose  ? ADVAIR DISKUS 100-50 MCG/DOSE AEPB inhale ONE PUFF into THE lungs IN THE MORNING AND IN THE EVENING (Patient taking differently: Inhale 1 puff into the lungs 2 (two) times daily.) 60 each 2   ? albuterol (VENTOLIN HFA) 108 (90 Base) MCG/ACT inhaler INHALE TWO PUFFS INTO THE LUNGS EVERY 6 HOURS AS NEEDED FOR WHEEZING OR SHORTNESS OF BREATH 18 g 0   ? aspirin EC 81 MG tablet Take 81 mg by mouth daily. Swallow whole.     ? atorvastatin (LIPITOR) 40 MG tablet TAKE 1 TABLET BY MOUTH DAILY AT 6PM 90 tablet 3   ? carvedilol (COREG) 3.125 MG tablet TAKE 1 TABLET BY MOUTH TWICE DAILY WITH A MEAL 60 tablet 11   ? diclofenac Sodium (VOLTAREN) 1 % GEL Apply 2 g topically 4 (four) times daily. (Patient taking differently: Apply 2 g topically 2 (two) times daily as needed (knee pain).) 350 g 3   ? Lacosamide (VIMPAT) 150 MG TABS Take 300 mg by mouth 2 (two) times daily.     ? losartan (COZAAR) 25 MG tablet TAKE 1 TABLET BY MOUTH EVERY DAY 60 tablet 5   ? methocarbamol (ROBAXIN) 500 MG tablet TAKE 1 TABLET BY MOUTH EVERY 8 HOURS AS NEEDED FOR FOR MUSCLE SPASMS 30 tablet 1   ?  nitroGLYCERIN (NITROSTAT) 0.4 MG SL tablet Place 1 tablet (0.4 mg total) under the tongue every 5 (five) minutes as needed for chest pain. (NEEDS TO BE SEEN BEFORE NEXT REFILL) 30 tablet 0   ? omeprazole (PRILOSEC OTC) 20 MG tablet Take 20 mg by mouth 2 (two) times daily.     ? SPIRIVA HANDIHALER 18 MCG inhalation capsule INHALE 1 PUFF BY MOUTH DAILY (Patient taking differently: Place 18 mcg into inhaler and inhale daily.) 90 capsule 0   ? zonisamide (ZONEGRAN) 100 MG capsule Take 200 mg by mouth at bedtime.     ? benzonatate (TESSALON) 100 MG capsule Take 1 capsule (100 mg total) by mouth every 8 (eight) hours. (Patient not taking: Reported on 06/02/2021) 21 capsule 0 Not Taking  ? rivaroxaban (XARELTO) 20 MG TABS tablet Take 1 tablet (20 mg total) by mouth daily with supper. (Patient not taking: Reported on 04/06/2021) 15 tablet 0 Not Taking  ? ?Allergies  ?Allergen Reactions  ?  Penicillins Hives  ? Bee Venom Swelling  ? Prednisone Other (See Comments)  ?  Patient did not like how it made her feel, caused jittery feeling.   ? Augmentin [Amoxicillin-Pot Clavulanate] Rash  ? Doxycycline Hyclate Itching and Rash  ?  ?Social History  ? ?Tobacco Use  ? Smoking status: Former  ?  Packs/day: 0.25  ?  Years: 42.00  ?  Pack years: 10.50  ?  Types: Cigarettes  ?  Quit date: 03/20/2014  ?  Years since quitting: 7.2  ? Smokeless tobacco: Never  ? Tobacco comments:  ?  01/21/15 restarted smoking 4 mos ago  ?Substance Use Topics  ? Alcohol use: No  ?  Alcohol/week: 0.0 standard drinks  ?  ?Family History  ?Problem Relation Age of Onset  ? COPD Mother   ? Diabetes Father   ? Hypertension Father   ? Hyperlipidemia Father   ? Cancer Sister   ?     Bone Cancer  ? Diabetes Brother   ? Cancer Brother   ? Cancer Paternal Uncle   ? Stroke Paternal Grandmother   ? Healthy Daughter   ? Healthy Son   ? Colon cancer Neg Hx   ?  ? ?Review of Systems  ?Constitutional:  Positive for activity change.  ?HENT: Negative.    ?Respiratory: Negative.     ?Cardiovascular: Negative.   ?Gastrointestinal: Negative.   ?Genitourinary: Negative.   ?Musculoskeletal:  Positive for gait problem and joint swelling.  ? ?Objective: ? ?Physical Exam ?HENT:  ?   Head:

## 2021-06-16 ENCOUNTER — Encounter (HOSPITAL_COMMUNITY)
Admission: RE | Disposition: A | Discharge status: Home Health | Payer: Self-pay | Source: Home / Self Care | Attending: Orthopaedic Surgery

## 2021-06-16 ENCOUNTER — Ambulatory Visit (HOSPITAL_COMMUNITY): Payer: Medicare Other | Admitting: Anesthesiology

## 2021-06-16 ENCOUNTER — Observation Stay (HOSPITAL_COMMUNITY): Payer: Medicare Other

## 2021-06-16 ENCOUNTER — Ambulatory Visit (HOSPITAL_BASED_OUTPATIENT_CLINIC_OR_DEPARTMENT_OTHER): Payer: Medicare Other | Admitting: Anesthesiology

## 2021-06-16 ENCOUNTER — Other Ambulatory Visit: Payer: Self-pay

## 2021-06-16 ENCOUNTER — Encounter (HOSPITAL_COMMUNITY): Payer: Self-pay | Admitting: Orthopaedic Surgery

## 2021-06-16 ENCOUNTER — Inpatient Hospital Stay (HOSPITAL_COMMUNITY)
Admission: RE | Admit: 2021-06-16 | Discharge: 2021-06-19 | DRG: 470 | Disposition: A | Discharge status: Home Health | Payer: Medicare Other | Attending: Orthopaedic Surgery | Admitting: Orthopaedic Surgery

## 2021-06-16 DIAGNOSIS — Z808 Family history of malignant neoplasm of other organs or systems: Secondary | ICD-10-CM

## 2021-06-16 DIAGNOSIS — Z833 Family history of diabetes mellitus: Secondary | ICD-10-CM

## 2021-06-16 DIAGNOSIS — I10 Essential (primary) hypertension: Secondary | ICD-10-CM

## 2021-06-16 DIAGNOSIS — Z7951 Long term (current) use of inhaled steroids: Secondary | ICD-10-CM | POA: Diagnosis not present

## 2021-06-16 DIAGNOSIS — M1712 Unilateral primary osteoarthritis, left knee: Secondary | ICD-10-CM | POA: Diagnosis not present

## 2021-06-16 DIAGNOSIS — I2510 Atherosclerotic heart disease of native coronary artery without angina pectoris: Secondary | ICD-10-CM

## 2021-06-16 DIAGNOSIS — I252 Old myocardial infarction: Secondary | ICD-10-CM

## 2021-06-16 DIAGNOSIS — Z7982 Long term (current) use of aspirin: Secondary | ICD-10-CM

## 2021-06-16 DIAGNOSIS — Z83438 Family history of other disorder of lipoprotein metabolism and other lipidemia: Secondary | ICD-10-CM | POA: Diagnosis not present

## 2021-06-16 DIAGNOSIS — Z8673 Personal history of transient ischemic attack (TIA), and cerebral infarction without residual deficits: Secondary | ICD-10-CM

## 2021-06-16 DIAGNOSIS — Z881 Allergy status to other antibiotic agents status: Secondary | ICD-10-CM | POA: Diagnosis not present

## 2021-06-16 DIAGNOSIS — Z880 Allergy status to penicillin: Secondary | ICD-10-CM | POA: Diagnosis not present

## 2021-06-16 DIAGNOSIS — G40409 Other generalized epilepsy and epileptic syndromes, not intractable, without status epilepticus: Secondary | ICD-10-CM | POA: Diagnosis present

## 2021-06-16 DIAGNOSIS — Z955 Presence of coronary angioplasty implant and graft: Secondary | ICD-10-CM

## 2021-06-16 DIAGNOSIS — Z825 Family history of asthma and other chronic lower respiratory diseases: Secondary | ICD-10-CM

## 2021-06-16 DIAGNOSIS — E785 Hyperlipidemia, unspecified: Secondary | ICD-10-CM | POA: Diagnosis present

## 2021-06-16 DIAGNOSIS — J439 Emphysema, unspecified: Secondary | ICD-10-CM | POA: Diagnosis present

## 2021-06-16 DIAGNOSIS — M0579 Rheumatoid arthritis with rheumatoid factor of multiple sites without organ or systems involvement: Secondary | ICD-10-CM | POA: Diagnosis not present

## 2021-06-16 DIAGNOSIS — Z96651 Presence of right artificial knee joint: Secondary | ICD-10-CM | POA: Diagnosis present

## 2021-06-16 DIAGNOSIS — Z86718 Personal history of other venous thrombosis and embolism: Secondary | ICD-10-CM | POA: Diagnosis not present

## 2021-06-16 DIAGNOSIS — Z79899 Other long term (current) drug therapy: Secondary | ICD-10-CM

## 2021-06-16 DIAGNOSIS — G8918 Other acute postprocedural pain: Secondary | ICD-10-CM | POA: Diagnosis not present

## 2021-06-16 DIAGNOSIS — Z01818 Encounter for other preprocedural examination: Secondary | ICD-10-CM

## 2021-06-16 DIAGNOSIS — K219 Gastro-esophageal reflux disease without esophagitis: Secondary | ICD-10-CM | POA: Diagnosis not present

## 2021-06-16 DIAGNOSIS — Z87891 Personal history of nicotine dependence: Secondary | ICD-10-CM

## 2021-06-16 DIAGNOSIS — Z471 Aftercare following joint replacement surgery: Secondary | ICD-10-CM | POA: Diagnosis not present

## 2021-06-16 DIAGNOSIS — Z823 Family history of stroke: Secondary | ICD-10-CM

## 2021-06-16 DIAGNOSIS — Z8249 Family history of ischemic heart disease and other diseases of the circulatory system: Secondary | ICD-10-CM | POA: Diagnosis not present

## 2021-06-16 DIAGNOSIS — R519 Headache, unspecified: Secondary | ICD-10-CM | POA: Diagnosis present

## 2021-06-16 DIAGNOSIS — Z91030 Bee allergy status: Secondary | ICD-10-CM

## 2021-06-16 DIAGNOSIS — G4733 Obstructive sleep apnea (adult) (pediatric): Secondary | ICD-10-CM | POA: Diagnosis present

## 2021-06-16 DIAGNOSIS — R569 Unspecified convulsions: Secondary | ICD-10-CM | POA: Diagnosis not present

## 2021-06-16 DIAGNOSIS — Z96652 Presence of left artificial knee joint: Secondary | ICD-10-CM | POA: Diagnosis not present

## 2021-06-16 HISTORY — PX: TOTAL KNEE ARTHROPLASTY: SHX125

## 2021-06-16 SURGERY — ARTHROPLASTY, KNEE, TOTAL
Anesthesia: Regional | Site: Knee | Laterality: Left

## 2021-06-16 MED ORDER — DEXAMETHASONE SODIUM PHOSPHATE 10 MG/ML IJ SOLN
INTRAMUSCULAR | Status: AC
  Filled 2021-06-16: qty 1

## 2021-06-16 MED ORDER — CHLORHEXIDINE GLUCONATE 0.12 % MT SOLN
15.0000 mL | Freq: Once | OROMUCOSAL | Status: AC
  Administered 2021-06-16: 15 mL via OROMUCOSAL
  Filled 2021-06-16: qty 15

## 2021-06-16 MED ORDER — MIDAZOLAM HCL 5 MG/5ML IJ SOLN
INTRAMUSCULAR | Status: DC | PRN
  Administered 2021-06-16: 1 mg via INTRAVENOUS

## 2021-06-16 MED ORDER — DOCUSATE SODIUM 100 MG PO CAPS
100.0000 mg | ORAL_CAPSULE | Freq: Two times a day (BID) | ORAL | Status: DC
  Administered 2021-06-16 – 2021-06-19 (×6): 100 mg via ORAL
  Filled 2021-06-16 (×7): qty 1

## 2021-06-16 MED ORDER — FENTANYL CITRATE (PF) 250 MCG/5ML IJ SOLN
INTRAMUSCULAR | Status: AC
  Filled 2021-06-16: qty 5

## 2021-06-16 MED ORDER — SODIUM CHLORIDE 0.9 % IV SOLN: INTRAVENOUS | Status: DC

## 2021-06-16 MED ORDER — ONDANSETRON HCL 4 MG/2ML IJ SOLN
INTRAMUSCULAR | Status: DC | PRN
Start: 2021-06-16 — End: 2021-06-16
  Administered 2021-06-16: 4 mg via INTRAVENOUS

## 2021-06-16 MED ORDER — DEXAMETHASONE SODIUM PHOSPHATE 10 MG/ML IJ SOLN
INTRAMUSCULAR | Status: DC | PRN
  Administered 2021-06-16: 10 mg via INTRAVENOUS

## 2021-06-16 MED ORDER — ORAL CARE MOUTH RINSE: 15.0000 mL | Freq: Once | OROMUCOSAL | Status: AC

## 2021-06-16 MED ORDER — RIVAROXABAN 10 MG PO TABS
10.0000 mg | ORAL_TABLET | Freq: Every day | ORAL | Status: DC
  Administered 2021-06-17 – 2021-06-19 (×3): 10 mg via ORAL
  Filled 2021-06-16 (×4): qty 1

## 2021-06-16 MED ORDER — PHENOL 1.4 % MT LIQD: 1.0000 | OROMUCOSAL | Status: DC | PRN

## 2021-06-16 MED ORDER — ZONISAMIDE 100 MG PO CAPS
200.0000 mg | ORAL_CAPSULE | Freq: Every day | ORAL | Status: DC
  Administered 2021-06-16 – 2021-06-18 (×3): 200 mg via ORAL
  Filled 2021-06-16 (×4): qty 2

## 2021-06-16 MED ORDER — ALBUTEROL SULFATE (2.5 MG/3ML) 0.083% IN NEBU: 3.0000 mL | INHALATION_SOLUTION | Freq: Four times a day (QID) | RESPIRATORY_TRACT | Status: DC | PRN

## 2021-06-16 MED ORDER — VANCOMYCIN HCL IN DEXTROSE 1-5 GM/200ML-% IV SOLN
1000.0000 mg | INTRAVENOUS | Status: AC
  Administered 2021-06-16: 1000 mg via INTRAVENOUS
  Filled 2021-06-16: qty 200

## 2021-06-16 MED ORDER — HYDROMORPHONE HCL 1 MG/ML IJ SOLN
0.5000 mg | INTRAMUSCULAR | Status: DC | PRN
  Administered 2021-06-17 (×2): 0.5 mg via INTRAVENOUS
  Filled 2021-06-16 (×2): qty 0.5

## 2021-06-16 MED ORDER — METOCLOPRAMIDE HCL 5 MG/ML IJ SOLN: 5.0000 mg | Freq: Three times a day (TID) | INTRAMUSCULAR | Status: DC | PRN

## 2021-06-16 MED ORDER — OXYCODONE-ACETAMINOPHEN 5-325 MG PO TABS: 1.0000 | ORAL_TABLET | Freq: Four times a day (QID) | ORAL | 0 refills | Status: DC | PRN

## 2021-06-16 MED ORDER — FENTANYL CITRATE (PF) 100 MCG/2ML IJ SOLN
INTRAMUSCULAR | Status: AC
  Filled 2021-06-16: qty 2

## 2021-06-16 MED ORDER — POLYETHYLENE GLYCOL 3350 17 G PO PACK: 17.0000 g | PACK | Freq: Every day | ORAL | Status: DC | PRN

## 2021-06-16 MED ORDER — ONDANSETRON HCL 4 MG/2ML IJ SOLN
INTRAMUSCULAR | Status: AC
  Filled 2021-06-16: qty 2

## 2021-06-16 MED ORDER — OMEPRAZOLE MAGNESIUM 20 MG PO TBEC: 20.0000 mg | DELAYED_RELEASE_TABLET | Freq: Two times a day (BID) | ORAL | Status: DC

## 2021-06-16 MED ORDER — ACETAMINOPHEN 10 MG/ML IV SOLN
INTRAVENOUS | Status: DC | PRN
  Administered 2021-06-16: 1000 mg via INTRAVENOUS

## 2021-06-16 MED ORDER — LIDOCAINE 2% (20 MG/ML) 5 ML SYRINGE
INTRAMUSCULAR | Status: DC | PRN
  Administered 2021-06-16: 60 mg via INTRAVENOUS

## 2021-06-16 MED ORDER — METHOCARBAMOL 500 MG PO TABS
500.0000 mg | ORAL_TABLET | Freq: Four times a day (QID) | ORAL | Status: DC | PRN
  Administered 2021-06-16 – 2021-06-19 (×6): 500 mg via ORAL
  Filled 2021-06-16 (×5): qty 1

## 2021-06-16 MED ORDER — LACOSAMIDE 200 MG PO TABS
300.0000 mg | ORAL_TABLET | Freq: Two times a day (BID) | ORAL | Status: DC
Start: 2021-06-16 — End: 2021-06-17
  Administered 2021-06-16 – 2021-06-17 (×3): 300 mg via ORAL
  Filled 2021-06-16 (×3): qty 2

## 2021-06-16 MED ORDER — BUPIVACAINE HCL (PF) 0.25 % IJ SOLN
INTRAMUSCULAR | Status: DC | PRN
  Administered 2021-06-16: 20 mL

## 2021-06-16 MED ORDER — SODIUM CHLORIDE 0.9 % IR SOLN
Status: DC | PRN
  Administered 2021-06-16: 3000 mL

## 2021-06-16 MED ORDER — MOMETASONE FURO-FORMOTEROL FUM 100-5 MCG/ACT IN AERO
2.0000 | INHALATION_SPRAY | Freq: Two times a day (BID) | RESPIRATORY_TRACT | Status: DC
  Administered 2021-06-16 – 2021-06-19 (×6): 2 via RESPIRATORY_TRACT
  Filled 2021-06-16: qty 8.8

## 2021-06-16 MED ORDER — PHENYLEPHRINE 80 MCG/ML (10ML) SYRINGE FOR IV PUSH (FOR BLOOD PRESSURE SUPPORT)
PREFILLED_SYRINGE | INTRAVENOUS | Status: AC
  Filled 2021-06-16: qty 10

## 2021-06-16 MED ORDER — METHOCARBAMOL 500 MG PO TABS
ORAL_TABLET | ORAL | Status: AC
  Filled 2021-06-16: qty 1

## 2021-06-16 MED ORDER — RIVAROXABAN 10 MG PO TABS: 10.0000 mg | ORAL_TABLET | Freq: Every day | ORAL | 0 refills | Status: DC

## 2021-06-16 MED ORDER — TIOTROPIUM BROMIDE MONOHYDRATE 18 MCG IN CAPS: 18.0000 ug | ORAL_CAPSULE | Freq: Every day | RESPIRATORY_TRACT | Status: DC

## 2021-06-16 MED ORDER — FENTANYL CITRATE (PF) 250 MCG/5ML IJ SOLN
INTRAMUSCULAR | Status: DC | PRN
  Administered 2021-06-16 (×2): 25 ug via INTRAVENOUS
  Administered 2021-06-16: 20 ug via INTRAVENOUS
  Administered 2021-06-16 (×2): 50 ug via INTRAVENOUS
  Administered 2021-06-16: 10 ug via INTRAVENOUS
  Administered 2021-06-16: 20 ug via INTRAVENOUS
  Administered 2021-06-16: 50 ug via INTRAVENOUS

## 2021-06-16 MED ORDER — METOCLOPRAMIDE HCL 5 MG PO TABS: 5.0000 mg | ORAL_TABLET | Freq: Three times a day (TID) | ORAL | Status: DC | PRN

## 2021-06-16 MED ORDER — PANTOPRAZOLE SODIUM 40 MG PO TBEC
40.0000 mg | DELAYED_RELEASE_TABLET | Freq: Two times a day (BID) | ORAL | Status: DC
  Administered 2021-06-16 – 2021-06-19 (×6): 40 mg via ORAL
  Filled 2021-06-16 (×6): qty 1

## 2021-06-16 MED ORDER — OXYCODONE HCL 5 MG/5ML PO SOLN: 5.0000 mg | Freq: Once | ORAL | Status: DC | PRN

## 2021-06-16 MED ORDER — ACETAMINOPHEN 500 MG PO TABS: 1000.0000 mg | ORAL_TABLET | Freq: Once | ORAL | Status: DC | PRN

## 2021-06-16 MED ORDER — SUGAMMADEX SODIUM 200 MG/2ML IV SOLN
INTRAVENOUS | Status: DC | PRN
  Administered 2021-06-16: 205 mg via INTRAVENOUS

## 2021-06-16 MED ORDER — METHOCARBAMOL 500 MG PO TABS: 500.0000 mg | ORAL_TABLET | Freq: Four times a day (QID) | ORAL | 0 refills | Status: DC | PRN

## 2021-06-16 MED ORDER — FENTANYL CITRATE (PF) 100 MCG/2ML IJ SOLN
25.0000 ug | INTRAMUSCULAR | Status: DC | PRN
  Administered 2021-06-16 (×2): 50 ug via INTRAVENOUS

## 2021-06-16 MED ORDER — METHOCARBAMOL 1000 MG/10ML IJ SOLN
500.0000 mg | Freq: Four times a day (QID) | INTRAVENOUS | Status: DC | PRN
  Filled 2021-06-16: qty 5

## 2021-06-16 MED ORDER — PHENYLEPHRINE HCL (PRESSORS) 10 MG/ML IV SOLN
INTRAVENOUS | Status: DC | PRN
  Administered 2021-06-16: 160 ug via INTRAVENOUS
  Administered 2021-06-16: 80 ug via INTRAVENOUS
  Administered 2021-06-16: 240 ug via INTRAVENOUS
  Administered 2021-06-16 (×2): 80 ug via INTRAVENOUS

## 2021-06-16 MED ORDER — BUPIVACAINE HCL (PF) 0.25 % IJ SOLN
INTRAMUSCULAR | Status: AC
  Filled 2021-06-16: qty 30

## 2021-06-16 MED ORDER — ACETAMINOPHEN 325 MG PO TABS
325.0000 mg | ORAL_TABLET | Freq: Four times a day (QID) | ORAL | Status: DC | PRN
  Administered 2021-06-16 – 2021-06-18 (×3): 650 mg via ORAL
  Filled 2021-06-16 (×3): qty 2

## 2021-06-16 MED ORDER — BUPIVACAINE LIPOSOME 1.3 % IJ SUSP
INTRAMUSCULAR | Status: DC | PRN
  Administered 2021-06-16: 20 mL

## 2021-06-16 MED ORDER — ACETAMINOPHEN 10 MG/ML IV SOLN: 1000.0000 mg | Freq: Once | INTRAVENOUS | Status: DC | PRN

## 2021-06-16 MED ORDER — UMECLIDINIUM BROMIDE 62.5 MCG/ACT IN AEPB
1.0000 | INHALATION_SPRAY | Freq: Every day | RESPIRATORY_TRACT | Status: DC
  Administered 2021-06-16 – 2021-06-19 (×4): 1 via RESPIRATORY_TRACT
  Filled 2021-06-16: qty 7

## 2021-06-16 MED ORDER — BUPIVACAINE LIPOSOME 1.3 % IJ SUSP
INTRAMUSCULAR | Status: AC
  Filled 2021-06-16: qty 20

## 2021-06-16 MED ORDER — MENTHOL 3 MG MT LOZG: 1.0000 | LOZENGE | OROMUCOSAL | Status: DC | PRN

## 2021-06-16 MED ORDER — PROPOFOL 10 MG/ML IV BOLUS
INTRAVENOUS | Status: DC | PRN
  Administered 2021-06-16: 140 mg via INTRAVENOUS

## 2021-06-16 MED ORDER — ACETAMINOPHEN 160 MG/5ML PO SOLN: 1000.0000 mg | Freq: Once | ORAL | Status: DC | PRN

## 2021-06-16 MED ORDER — LOSARTAN POTASSIUM 50 MG PO TABS
25.0000 mg | ORAL_TABLET | Freq: Every day | ORAL | Status: DC
Start: 2021-06-17 — End: 2021-06-19
  Administered 2021-06-17 – 2021-06-19 (×3): 25 mg via ORAL
  Filled 2021-06-16 (×4): qty 1

## 2021-06-16 MED ORDER — OXYCODONE HCL 5 MG PO TABS: 5.0000 mg | ORAL_TABLET | Freq: Once | ORAL | Status: DC | PRN

## 2021-06-16 MED ORDER — 0.9 % SODIUM CHLORIDE (POUR BTL) OPTIME
TOPICAL | Status: DC | PRN
  Administered 2021-06-16: 1000 mL

## 2021-06-16 MED ORDER — LACTATED RINGERS IV SOLN
INTRAVENOUS | Status: DC
Start: 2021-06-16 — End: 2021-06-16

## 2021-06-16 MED ORDER — CARVEDILOL 3.125 MG PO TABS
3.1250 mg | ORAL_TABLET | Freq: Two times a day (BID) | ORAL | Status: DC
  Administered 2021-06-16 – 2021-06-19 (×5): 3.125 mg via ORAL
  Filled 2021-06-16 (×6): qty 1

## 2021-06-16 MED ORDER — BUPIVACAINE LIPOSOME 1.3 % IJ SUSP: 20.0000 mL | Freq: Once | INTRAMUSCULAR | Status: DC

## 2021-06-16 MED ORDER — BUPIVACAINE-EPINEPHRINE (PF) 0.5% -1:200000 IJ SOLN
INTRAMUSCULAR | Status: DC | PRN
  Administered 2021-06-16: 20 mL via PERINEURAL

## 2021-06-16 MED ORDER — OXYCODONE HCL 5 MG PO TABS
5.0000 mg | ORAL_TABLET | ORAL | Status: DC | PRN
  Administered 2021-06-16 – 2021-06-17 (×3): 10 mg via ORAL
  Administered 2021-06-18: 5 mg via ORAL
  Administered 2021-06-18: 10 mg via ORAL
  Administered 2021-06-19: 5 mg via ORAL
  Administered 2021-06-19: 10 mg via ORAL
  Filled 2021-06-16: qty 1
  Filled 2021-06-16 (×3): qty 2
  Filled 2021-06-16: qty 1
  Filled 2021-06-16 (×2): qty 2

## 2021-06-16 MED ORDER — ONDANSETRON HCL 4 MG PO TABS: 4.0000 mg | ORAL_TABLET | Freq: Four times a day (QID) | ORAL | Status: DC | PRN

## 2021-06-16 MED ORDER — ONDANSETRON HCL 4 MG/2ML IJ SOLN: 4.0000 mg | Freq: Four times a day (QID) | INTRAMUSCULAR | Status: DC | PRN

## 2021-06-16 MED ORDER — ROCURONIUM BROMIDE 10 MG/ML (PF) SYRINGE
PREFILLED_SYRINGE | INTRAVENOUS | Status: DC | PRN
  Administered 2021-06-16: 70 mg via INTRAVENOUS

## 2021-06-16 MED ORDER — ATORVASTATIN CALCIUM 40 MG PO TABS
40.0000 mg | ORAL_TABLET | Freq: Every day | ORAL | Status: DC
Start: 2021-06-16 — End: 2021-06-19
  Administered 2021-06-16 – 2021-06-19 (×4): 40 mg via ORAL
  Filled 2021-06-16 (×4): qty 1

## 2021-06-16 MED ORDER — MIDAZOLAM HCL 2 MG/2ML IJ SOLN
INTRAMUSCULAR | Status: AC
  Filled 2021-06-16: qty 2

## 2021-06-16 MED ORDER — ACETAMINOPHEN 10 MG/ML IV SOLN
INTRAVENOUS | Status: AC
  Filled 2021-06-16: qty 100

## 2021-06-16 MED ORDER — NITROGLYCERIN 0.4 MG SL SUBL: 0.4000 mg | SUBLINGUAL_TABLET | SUBLINGUAL | Status: DC | PRN

## 2021-06-16 SURGICAL SUPPLY — 67 items
ATTUNE PS FEM LT SZ 4 CEM KNEE (Femur) ×1 IMPLANT
ATTUNE PSRP INSR SZ4 5 KNEE (Insert) ×1 IMPLANT
BAG COUNTER SPONGE SURGICOUNT (BAG) ×2 IMPLANT
BANDAGE ESMARK 6X9 LF (GAUZE/BANDAGES/DRESSINGS) ×1 IMPLANT
BASEPLATE TIBIAL ROTATING SZ 4 (Knees) ×1 IMPLANT
BLADE SAGITTAL 25.0X1.19X90 (BLADE) ×2 IMPLANT
BLADE SAW SGTL 13X75X1.27 (BLADE) ×2 IMPLANT
BNDG ELASTIC 4X5.8 VLCR NS LF (GAUZE/BANDAGES/DRESSINGS) ×2 IMPLANT
BNDG ELASTIC 4X5.8 VLCR STR LF (GAUZE/BANDAGES/DRESSINGS) ×2 IMPLANT
BNDG ELASTIC 6X10 VLCR STRL LF (GAUZE/BANDAGES/DRESSINGS) ×2 IMPLANT
BNDG ESMARK 6X9 LF (GAUZE/BANDAGES/DRESSINGS) ×2
BOWL SMART MIX CTS (DISPOSABLE) ×2 IMPLANT
CEMENT HV SMART SET (Cement) ×4 IMPLANT
COVER SURGICAL LIGHT HANDLE (MISCELLANEOUS) ×2 IMPLANT
CUFF TOURN SGL QUICK 34 (TOURNIQUET CUFF) ×2
CUFF TOURN SGL QUICK 42 (TOURNIQUET CUFF) IMPLANT
CUFF TRNQT CYL 34X4.125X (TOURNIQUET CUFF) ×1 IMPLANT
DRAPE ORTHO SPLIT 77X108 STRL (DRAPES) ×4
DRAPE SURG ORHT 6 SPLT 77X108 (DRAPES) ×2 IMPLANT
DRAPE U-SHAPE 47X51 STRL (DRAPES) ×2 IMPLANT
DRSG AQUACEL AG ADV 3.5X10 (GAUZE/BANDAGES/DRESSINGS) ×1 IMPLANT
DRSG PAD ABDOMINAL 8X10 ST (GAUZE/BANDAGES/DRESSINGS) ×2 IMPLANT
DURAPREP 26ML APPLICATOR (WOUND CARE) ×4 IMPLANT
ELECT REM PT RETURN 9FT ADLT (ELECTROSURGICAL) ×2
ELECTRODE REM PT RTRN 9FT ADLT (ELECTROSURGICAL) ×1 IMPLANT
FACESHIELD WRAPAROUND (MASK) ×4 IMPLANT
FACESHIELD WRAPAROUND OR TEAM (MASK) ×2 IMPLANT
GAUZE XEROFORM 5X9 LF (GAUZE/BANDAGES/DRESSINGS) ×2 IMPLANT
GLOVE BIOGEL PI IND STRL 8 (GLOVE) ×2 IMPLANT
GLOVE BIOGEL PI INDICATOR 8 (GLOVE) ×2
GLOVE ORTHO TXT STRL SZ7.5 (GLOVE) ×4 IMPLANT
GOWN STRL REUS W/ TWL LRG LVL3 (GOWN DISPOSABLE) ×1 IMPLANT
GOWN STRL REUS W/ TWL XL LVL3 (GOWN DISPOSABLE) ×1 IMPLANT
GOWN STRL REUS W/TWL 2XL LVL3 (GOWN DISPOSABLE) ×2 IMPLANT
GOWN STRL REUS W/TWL LRG LVL3 (GOWN DISPOSABLE) ×2
GOWN STRL REUS W/TWL XL LVL3 (GOWN DISPOSABLE) ×2
HANDPIECE INTERPULSE COAX TIP (DISPOSABLE) ×2
IMMOBILIZER KNEE 22 UNIV (SOFTGOODS) ×2 IMPLANT
KIT BASIN OR (CUSTOM PROCEDURE TRAY) ×2 IMPLANT
KIT TURNOVER KIT B (KITS) ×2 IMPLANT
MANIFOLD NEPTUNE II (INSTRUMENTS) ×2 IMPLANT
MARKER SKIN DUAL TIP RULER LAB (MISCELLANEOUS) ×2 IMPLANT
NDL 18GX1X1/2 (RX/OR ONLY) (NEEDLE) ×1 IMPLANT
NDL HYPO 25GX1X1/2 BEV (NEEDLE) ×1 IMPLANT
NEEDLE 18GX1X1/2 (RX/OR ONLY) (NEEDLE) ×2 IMPLANT
NEEDLE HYPO 25GX1X1/2 BEV (NEEDLE) ×2 IMPLANT
NS IRRIG 1000ML POUR BTL (IV SOLUTION) ×2 IMPLANT
PACK TOTAL JOINT (CUSTOM PROCEDURE TRAY) ×2 IMPLANT
PAD ABD 8X10 STRL (GAUZE/BANDAGES/DRESSINGS) ×1 IMPLANT
PAD ARMBOARD 7.5X6 YLW CONV (MISCELLANEOUS) ×4 IMPLANT
PADDING CAST COTTON 6X4 STRL (CAST SUPPLIES) ×2 IMPLANT
PATELLA MEDIAL ATTUN 35MM KNEE (Knees) ×1 IMPLANT
SET HNDPC FAN SPRY TIP SCT (DISPOSABLE) ×1 IMPLANT
STAPLER VISISTAT 35W (STAPLE) IMPLANT
SUCTION FRAZIER HANDLE 10FR (MISCELLANEOUS) ×2
SUCTION TUBE FRAZIER 10FR DISP (MISCELLANEOUS) ×1 IMPLANT
SUT VIC AB 0 CT1 27 (SUTURE) ×2
SUT VIC AB 0 CT1 27XBRD ANBCTR (SUTURE) ×1 IMPLANT
SUT VIC AB 1 CTX 36 (SUTURE) ×4
SUT VIC AB 1 CTX36XBRD ANBCTR (SUTURE) ×2 IMPLANT
SUT VIC AB 2-0 CT1 27 (SUTURE) ×4
SUT VIC AB 2-0 CT1 TAPERPNT 27 (SUTURE) ×2 IMPLANT
SYR 50ML LL SCALE MARK (SYRINGE) ×2 IMPLANT
SYR CONTROL 10ML LL (SYRINGE) ×2 IMPLANT
TOWEL GREEN STERILE (TOWEL DISPOSABLE) ×2 IMPLANT
TOWEL GREEN STERILE FF (TOWEL DISPOSABLE) ×2 IMPLANT
TRAY CATH 16FR W/PLASTIC CATH (SET/KITS/TRAYS/PACK) IMPLANT

## 2021-06-16 NOTE — Transfer of Care (Signed)
Immediate Anesthesia Transfer of Care Note ? ?Patient: Barba Solt [place] ? ?Procedure(s) Performed: LEFT TOTAL KNEE ARTHROPLASTY (Left: Knee) ? ?Patient Location: PACU ? ?Anesthesia Type:GA combined with regional for post-op pain ? ?Level of Consciousness: awake, alert  and oriented ? ?Airway & Oxygen Therapy: Patient Spontanous Breathing and Patient connected to nasal cannula oxygen ? ?Post-op Assessment: Report given to RN, Post -op Vital signs reviewed and stable, Patient moving all extremities X 4 and Patient able to stick tongue midline ? ?Post vital signs: Reviewed ? ?Last Vitals:  ?Vitals Value Taken Time  ?BP 193/78 06/16/21 0945  ?Temp 97.8   ?Pulse 68 06/16/21 0947  ?Resp 14 06/16/21 0947  ?SpO2 100 % 06/16/21 0947  ?Vitals shown include unvalidated device data. ? ?Last Pain:  ?Vitals:  ? 06/16/21 0605  ?TempSrc:   ?PainSc: 0-No pain  ?   ? ?  ? ?Complications: No notable events documented. ?

## 2021-06-16 NOTE — Anesthesia Procedure Notes (Signed)
Anesthesia Regional Block: Adductor canal block  ? ?Pre-Anesthetic Checklist: , timeout performed,  Correct Patient, Correct Site, Correct Laterality,  Correct Procedure, Correct Position, site marked,  Risks and benefits discussed,  Surgical consent,  Pre-op evaluation,  At surgeon's request and post-op pain management ? ?Laterality: Left and Lower ? ?Prep: chloraprep     ?  ?Needles:  ?Injection technique: Single-shot ? ?  ? ? ?Needle Length: 9cm  ?Needle Gauge: 22  ? ? ? ?Additional Needles: ?Arrow? StimuQuik? ECHO Echogenic Stimulating PNB Needle ? ?Procedures:,,,, ultrasound used (permanent image in chart),,    ?Narrative:  ?Start time: 06/16/2021 7:20 AM ?End time: 06/16/2021 7:24 AM ?Injection made incrementally with aspirations every 5 mL. ? ?Performed by: Personally  ?Anesthesiologist: Oleta Mouse, MD ? ? ? ? ?

## 2021-06-16 NOTE — Progress Notes (Signed)
Orthopedic Tech Progress Note ?Patient Details:  ?Courtney Mcconnell ?[birth year] ?[id number] ? ?CPM Left Knee ?CPM Left Knee: On ?Left Knee Flexion (Degrees): 65 ?Left Knee Extension (Degrees): 0 ?Additional Comments: Unable to tolerate anything above 65 degrees ? ?Post Interventions ?Patient Tolerated: Well ? ?Vernona Rieger ?06/16/2021, 11:03 AM ? ?

## 2021-06-16 NOTE — Evaluation (Signed)
Physical Therapy Evaluation ?Patient Details ?Name: Courtney Mcconnell ?MRN: [id number] ?DOB: Jun 15, 1955 ?Today's Date: 06/16/2021 ? ?History of Present Illness ? Pt adm 5/5 for lt TKR. PMH - Rt TKR, seizure, OA, RA, NSTEMI, sleep apnea  ?Clinical Impression ? Pt admitted with above diagnosis and presents to PT with functional limitations due to deficits listed below (See PT problem list). Pt needs skilled PT to maximize independence and safety to allow discharge to home with family.  ?   ?   ? ?Recommendations for follow up therapy are one component of a multi-disciplinary discharge planning process, led by the attending physician.  Recommendations may be updated based on patient status, additional functional criteria and insurance authorization. ? ?Follow Up Recommendations Follow physician's recommendations for discharge plan and follow up therapies ? ?  ?Assistance Recommended at Discharge Intermittent Supervision/Assistance  ?Patient can return home with the following ? A little help with walking and/or transfers;Help with stairs or ramp for entrance;A little help with bathing/dressing/bathroom;Assist for transportation ? ?  ?Equipment Recommendations None recommended by PT  ?Recommendations for Other Services ?    ?  ?Functional Status Assessment Patient has had a recent decline in their functional status and demonstrates the ability to make significant improvements in function in a reasonable and predictable amount of time.  ? ?  ?Precautions / Restrictions Precautions ?Precautions: Fall;Knee ?Required Braces or Orthoses: Knee Immobilizer - Left ?Knee Immobilizer - Left: On except when in CPM  ? ?  ? ?Mobility ? Bed Mobility ?Overal bed mobility: Needs Assistance ?Bed Mobility: Supine to Sit ?  ?  ?Supine to sit: Min assist, HOB elevated ?  ?  ?General bed mobility comments: Assist to elevate trunk into sitting ?  ? ?Transfers ?Overall transfer level: Needs assistance ?Equipment used: Rolling walker (2  wheels) ?Transfers: Sit to/from Stand, Bed to chair/wheelchair/BSC ?Sit to Stand: Min assist ?  ?Step pivot transfers: Min assist ?  ?  ?  ?General transfer comment: Assist to bring hips up. Less assist with each transfer. Verbal cues for hand placement. Bed to recliner to bsc to recliner ?  ? ?Ambulation/Gait ?Ambulation/Gait assistance: Min guard ?Gait Distance (Feet): 4 Feet (forward/backward) ?Assistive device: Rolling walker (2 wheels) ?Gait Pattern/deviations: Step-to pattern, Decreased step length - right, Decreased stance time - left ?Gait velocity: decr ?Gait velocity interpretation: <1.31 ft/sec, indicative of household ambulator ?  ?General Gait Details: Assist for safety. Distance limited by multiple transfers and toileting needs prior to amb ? ?Stairs ?  ?  ?  ?  ?  ? ?Wheelchair Mobility ?  ? ?Modified Rankin (Stroke Patients Only) ?  ? ?  ? ?Balance Overall balance assessment: Mild deficits observed, not formally tested ?  ?  ?  ?  ?  ?  ?  ?  ?  ?  ?  ?  ?  ?  ?  ?  ?  ?  ?   ? ? ? ?Pertinent Vitals/Pain Pain Assessment ?Pain Assessment: 0-10 ?Pain Score: 5  ?Pain Location: lt knee ?Pain Descriptors / Indicators: Guarding, Grimacing ?Pain Intervention(s): Limited activity within patient's tolerance, Monitored during session, Repositioned  ? ? ?Home Living Family/patient expects to be discharged to:: Private residence ?Living Arrangements: Children ?Available Help at Discharge: Family;Available 24 hours/day ?Type of Home: House ?Home Access: Stairs to enter ?Entrance Stairs-Rails: None ?Entrance Stairs-Number of Steps: 3 ?  ?Home Layout: Two level;Able to live on main level with bedroom/bathroom ?Home Equipment: Wheelchair - manual;Shower seat;Grab bars - tub/shower;Grab  bars - toilet;Cane - single point;BSC/3in1;Rolling Walker (2 wheels) ?   ?  ?Prior Function Prior Level of Function : Independent/Modified Independent ?  ?  ?  ?  ?  ?  ?Mobility Comments: Using cane recently due to lt knee pain ?  ?   ? ? ?Hand Dominance  ?   ? ?  ?Extremity/Trunk Assessment  ? Upper Extremity Assessment ?Upper Extremity Assessment: Overall WFL for tasks assessed ?  ? ?Lower Extremity Assessment ?Lower Extremity Assessment: LLE deficits/detail ?LLE Deficits / Details: Fair quad set. Knee AAROM 10-60 degrees in sitting ?  ? ?   ?Communication  ? Communication: No difficulties  ?Cognition Arousal/Alertness: Awake/alert ?Behavior During Therapy: [facility] for tasks assessed/performed ?Overall Cognitive Status: Within Functional Limits for tasks assessed ?  ?  ?  ?  ?  ?  ?  ?  ?  ?  ?  ?  ?  ?  ?  ?  ?  ?  ?  ? ?  ?General Comments   ? ?  ?Exercises Total Joint Exercises ?Quad Sets: AROM, Left, 5 reps, Supine ?Knee Flexion: AAROM, 5 reps, Seated ?Goniometric ROM: 10-60  ? ?Assessment/Plan  ?  ?PT Assessment Patient needs continued PT services  ?PT Problem List Decreased strength;Decreased range of motion;Decreased mobility;Pain ? ?   ?  ?PT Treatment Interventions DME instruction;Gait training;Stair training;Functional mobility training;Therapeutic activities;Therapeutic exercise;Patient/family education   ? ?PT Goals (Current goals can be found in the Care Plan section)  ?Acute Rehab PT Goals ?Patient Stated Goal: return to a more active lifestyle ?PT Goal Formulation: With patient ?Time For Goal Achievement: 06/20/21 ?Potential to Achieve Goals: Good ? ?  ?Frequency 7X/week ?  ? ? ?Co-evaluation   ?  ?  ?  ?  ? ? ?  ?AM-PAC PT "6 Clicks" Mobility  ?Outcome Measure Help needed turning from your back to your side while in a flat bed without using bedrails?: A Little ?Help needed moving from lying on your back to sitting on the side of a flat bed without using bedrails?: A Little ?Help needed moving to and from a bed to a chair (including a wheelchair)?: A Little ?Help needed standing up from a chair using your arms (e.g., wheelchair or bedside chair)?: A Little ?Help needed to walk in hospital room?: A Little ?Help needed climbing 3-5  steps with a railing? : Total ?6 Click Score: 16 ? ?  ?End of Session Equipment Utilized During Treatment: Gait belt ?Activity Tolerance: Patient tolerated treatment well ?Patient left: in chair;with call bell/phone within reach;with chair alarm set ?Nurse Communication: Mobility status ?PT Visit Diagnosis: Other abnormalities of gait and mobility (R26.89);Pain ?Pain - Right/Left: Left ?Pain - part of body: Knee ?  ? ?Time: 6144-3154 ?PT Time Calculation (min) (ACUTE ONLY): 40 min ? ? ?Charges:   PT Evaluation ?$PT Eval Moderate Complexity: 1 Mod ?PT Treatments ?$Gait Training: 8-22 mins ?  ?   ? ? ?[facility] PT ?Acute Rehabilitation Services ?Office [phone] ? ? ?Shary Decamp [facility] ?06/16/2021, 6:44 PM ? ?

## 2021-06-16 NOTE — Anesthesia Postprocedure Evaluation (Signed)
Anesthesia Post Note ? ?Patient: Courtney Mcconnell ? ?Procedure(s) Performed: LEFT TOTAL KNEE ARTHROPLASTY (Left: Knee) ? ?  ? ?Patient location during evaluation: PACU ?Anesthesia Type: Regional and General ?Level of consciousness: awake and alert ?Pain management: pain level controlled ?Vital Signs Assessment: post-procedure vital signs reviewed and stable ?Respiratory status: spontaneous breathing, nonlabored ventilation, respiratory function stable and patient connected to nasal cannula oxygen ?Cardiovascular status: blood pressure returned to baseline and stable ?Postop Assessment: no apparent nausea or vomiting ?Anesthetic complications: no ? ? ?No notable events documented. ? ?Last Vitals:  ?Vitals:  ? 06/16/21 1030 06/16/21 1045  ?BP: (!) 155/82 (!) 164/71  ?Pulse: 62 64  ?Resp: 12 14  ?Temp:  36.5 ?C  ?SpO2: 97% 100%  ?  ?Last Pain:  ?Vitals:  ? 06/16/21 1045  ?TempSrc:   ?PainSc: 0-No pain  ? ? ?  ?  ?  ?  ?  ?  ? ?Basil Blakesley ? ? ? ? ?

## 2021-06-16 NOTE — Discharge Instructions (Addendum)
INSTRUCTIONS AFTER JOINT REPLACEMENT  ? ?Remove items at home which could result in a fall. This includes throw rugs or furniture in walking pathways ?ICE to the affected joint every three hours while awake for 30 minutes at a time, for at least the first 3-5 days, and then as needed for pain and swelling.  Continue to use ice for pain and swelling. You may notice swelling that will progress down to the foot and ankle.  This is normal after surgery.  Elevate your leg when you are not up walking on it.   ?Continue to use the breathing machine you got in the hospital (incentive spirometer) which will help keep your temperature down.  It is common for your temperature to cycle up and down following surgery, especially at night when you are not up moving around and exerting yourself.  The breathing machine keeps your lungs expanded and your temperature down. ? ? ?DIET:  As you were doing prior to hospitalization, we recommend a well-balanced diet. ? ?DRESSING / WOUND CARE / SHOWERING ? ?Your dressing is water proof and you can leave it on until your return visit with Dr. Lorin Mercy. If the dressing comes off you can use gauze and tape over the staples.  ? ?ACTIVITY ? ?Increase activity slowly as tolerated, but follow the weight bearing instructions below.   ?No driving for 6 weeks or until further direction given by your physician.  You cannot drive while taking narcotics.  ?No lifting or carrying greater than 10 lbs. until further directed by your surgeon. ?Avoid periods of inactivity such as sitting longer than an hour when not asleep. This helps prevent blood clots.  ?You may return to work once you are authorized by your doctor.  ? ? ? ?WEIGHT BEARING  ? ?Weight bearing as tolerated with assist device (walker, cane, etc) as directed, use it as long as suggested by your surgeon or therapist, typically at least 4-6 weeks. ? ? ?EXERCISES ? ?Results after joint replacement surgery are often greatly improved when you follow  the exercise, range of motion and muscle strengthening exercises prescribed by your doctor. Safety measures are also important to protect the joint from further injury. Any time any of these exercises cause you to have increased pain or swelling, decrease what you are doing until you are comfortable again and then slowly increase them. If you have problems or questions, call your caregiver or physical therapist for advice.  ? ?Rehabilitation is important following a joint replacement. After just a few days of immobilization, the muscles of the leg can become weakened and shrink (atrophy).  These exercises are designed to build up the tone and strength of the thigh and leg muscles and to improve motion. Often times heat used for twenty to thirty minutes before working out will loosen up your tissues and help with improving the range of motion but do not use heat for the first two weeks following surgery (sometimes heat can increase post-operative swelling).  ? ?These exercises can be done on a training (exercise) mat, on the floor, on a table or on a bed. Use whatever works the best and is most comfortable for you.    Use music or television while you are exercising so that the exercises are a pleasant break in your day. This will make your life better with the exercises acting as a break in your routine that you can look forward to.   Perform all exercises about fifteen times, three times per day  or as directed.  You should exercise both the operative leg and the other leg as well. ? ?Exercises include: ?  ?Quad Sets - Tighten up the muscle on the front of the thigh (Quad) and hold for 5-10 seconds.   ?Straight Leg Raises - With your knee straight (if you were given a brace, keep it on), lift the leg to 60 degrees, hold for 3 seconds, and slowly lower the leg.  Perform this exercise against resistance later as your leg gets stronger.  ?Leg Slides: Lying on your back, slowly slide your foot toward your buttocks,  bending your knee up off the floor (only go as far as is comfortable). Then slowly slide your foot back down until your leg is flat on the floor again.  ?Angel Wings: Lying on your back spread your legs to the side as far apart as you can without causing discomfort.  ?Hamstring Strength:  Lying on your back, push your heel against the floor with your leg straight by tightening up the muscles of your buttocks.  Repeat, but this time bend your knee to a comfortable angle, and push your heel against the floor.  You may put a pillow under the heel to make it more comfortable if necessary.  ? ?A rehabilitation program following joint replacement surgery can speed recovery and prevent re-injury in the future due to weakened muscles. Contact your doctor or a physical therapist for more information on knee rehabilitation.  ? ? ?CONSTIPATION ? ?Constipation is defined medically as fewer than three stools per week and severe constipation as less than one stool per week.  Even if you have a regular bowel pattern at home, your normal regimen is likely to be disrupted due to multiple reasons following surgery.  Combination of anesthesia, postoperative narcotics, change in appetite and fluid intake all can affect your bowels.  ? ?YOU MUST use at least one of the following options; they are listed in order of increasing strength to get the job done.  They are all available over the counter, and you may need to use some, POSSIBLY even all of these options:   ? ?Drink plenty of fluids (prune juice may be helpful) and high fiber foods ?Colace 100 mg by mouth twice a day  ?Senokot for constipation as directed and as needed Dulcolax (bisacodyl), take with full glass of water  ?Miralax (polyethylene glycol) once or twice a day as needed. ? ?If you have tried all these things and are unable to have a bowel movement in the first 3-4 days after surgery call either your surgeon or your primary doctor.   ? ?If you experience loose stools or  diarrhea, hold the medications until you stool forms back up.  If your symptoms do not get better within 1 week or if they get worse, check with your doctor.  If you experience "the worst abdominal pain ever" or develop nausea or vomiting, please contact the office immediately for further recommendations for treatment. ? ? ?ITCHING:  If you experience itching with your medications, try taking only a single pain pill, or even half a pain pill at a time.  You can also use Benadryl over the counter for itching or also to help with sleep.  ? ?TED HOSE STOCKINGS:  Use stockings on both legs until for at least 2 weeks or as directed by physician office. They may be removed at night for sleeping. ? ?MEDICATIONS:  See your medication summary on the ?After Visit Summary? that nursing  will review with you.  You may have some home medications which will be placed on hold until you complete the course of blood thinner medication.  It is important for you to complete the blood thinner medication as prescribed. ? ?PRECAUTIONS:  If you experience chest pain or shortness of breath - call 911 immediately for transfer to the hospital emergency department.  ? ?If you develop a fever greater that 101 F, purulent drainage from wound, increased redness or drainage from wound, foul odor from the wound/dressing, or calf pain - CONTACT YOUR SURGEON.   ?                                                ?FOLLOW-UP APPOINTMENTS:  If you do not already have a post-op appointment, please call the office for an appointment to be seen by your surgeon.  Guidelines for how soon to be seen are listed in your ?After Visit Summary?, but are typically between 1-4 weeks after surgery. ? ?OTHER INSTRUCTIONS:  ? ?Knee Replacement:  Do not place pillow under knee, focus on keeping the knee straight while resting. CPM instructions: 0-90 degrees, 2 hours in the morning, 2 hours in the afternoon, and 2 hours in the evening. Place foam block, curve side up under  heel at all times except when in CPM or when walking.  DO NOT modify, tear, cut, or change the foam block in any way. ? ?POST-OPERATIVE OPIOID TAPER INSTRUCTIONS: ?It is important to wean off of your opioi

## 2021-06-16 NOTE — Op Note (Signed)
Preop diagnosis: Left knee primary osteoarthritis ? ?Postop diagnosis same ? ?Procedure: Left total knee arthroplasty. ? ?Surgeon: Rodell Perna, MD ? ?Assistant: Benjiman Core, PA-C medically necessary and present for the entire procedure ? ?Anesthesia General orotracheal plus Marcaine and Exparel 20+20.  EBL 100 cc ? ?Tourniquet 300 x 1 hour. ? ?Implants:Implants ? ?CEMENT HV SMART SET - [id number] ? ?Inventory Item: CEMENT HV SMART SET Serial no.:  Model/Cat no.: [id number]  ?Implant name: CEMENT HV SMART SET - [id number] Laterality: Left Area: Knee  ?Manufacturer: Dewitt [employer]es Date of Manufacture:    ?Action: Implanted Number Used: 2   ?Device Identifier:  Device Identifier Type:    ? ?PATELLA MEDIAL ATTUN 35MM KNEE - [id number] ? ?Inventory Item: PATELLA MEDIAL ATTUN 35MM KNEE Serial no.:  Model/Cat no.: [id number]  ?Implant name: PATELLA MEDIAL ATTUN 35MM KNEE - [id number] Laterality: Left Area: Knee  ?Manufacturer: Dewitt Hoes Date of Manufacture:    ?Action: Implanted Number Used: 1   ?Device Identifier:  Device Identifier Type:    ? ?ATTUNE PS FEM LT SZ 4 CEM KNEE - [id number] ? ?Inventory Item: ATTUNE PS FEM LT SZ 4 CEM KNEE Serial no.:  Model/Cat no.: [id number]  ?Implant name: ATTUNE PS FEM LT SZ 4 CEM KNEE - [id number] Laterality: Left Area: Knee  ?Manufacturer: Dewitt Hoes Date of Manufacture:    ?Action: Implanted Number Used: 1   ?Device Identifier:  Device Identifier Type:    ? ?ATTUNE PSRP INSR SZ4 5MM KNEE - [id number] ? ?Inventory Item: ATTUNE PSRP INSR SZ4 5MM KNEE Serial no.:  Model/Cat no.: [id number]  ?Implant name: ATTUNE PSRP INSR SZ4 5MM KNEE - [id number] Laterality: Left Area: Knee  ?Manufacturer: Dewitt Hoes Date of Manufacture:    ?Action: Implanted Number Used: 1   ?Device Identifier:  Device Identifier Type:    ? ?BASEPLATE TIBIAL ROTATING SZ 4 - [id number] ? ?Inventory Item: BASEPLATE TIBIAL ROTATING SZ 4 Serial no.:  Model/Cat no.: [id number]  ?Implant name: BASEPLATE TIBIAL  ROTATING SZ 4 - [id number] Laterality: Left Area: Knee  ?Manufacturer: Dewitt Hoes Date of Manufacture:    ?Action: Implanted Number Used: 1   ?Device Identifier:  Device Identifier Type:    ?Procedure: After induction of preoperative block general anesthesia proximal thigh tourniquet lateral post heel bump standard prepping and draping was performed for total knee arthroplasty sterile skin marker Betadine Steri-Drape impervious stockinette Coban timeout procedure IV TXA.  Leg was wrapped in Esmarch tourniquet inflated midline incision medial parapatellar incision patella was everted cut 10 mm.  She was identical to the opposite knee that was done last year which was size 4 femur size 4 tibia and 35 mm patella.  Distal cut was made 10 mm on the femur to 10 off the tibia and she did not quite come out in full extension even after removal of posterior osteophytes primarily in the medial side.  Marginal osteophytes were removed on both sides she had eburnated bone consistent with severe osteoarthritis.  We came back and took additional 2 mm off the tibia which allowed the 5 mm spacer to allow full extension and there is good flexion-extension balance.  Schedule been made trials were inserted.  Pulse lavage back to mixing of the cement tibia cemented first followed by femur.  5 mm permanent poly was inserted knee came out full extension and the patella with 3 peg design was held with the clamp.  While cement was setting up Exparel Marcaine was injected for postoperative analgesia.  When the cement was  hardened 15 minutes tourniquet deflated. ? ?Hemostasis was obtained.  All excessive cement had been removed.  There was good collateral balance good flexion extension.  Standard layered closure with #1 Vicryl then the quad tendon repair and medial capsule colotomy.  2-0 Vicryl subtenons tissue skin staple closure postop dressing and knee immobilizer.  Ice machine was added at the patient's request. ?

## 2021-06-16 NOTE — Care Plan (Signed)
Ortho Bundle Case Management Note ? ?Patient Details  ?Name: Courtney Mcconnell ?MRN: [id number] ?Date of Birth: May 28, 1955 ? ?                ?[facility] RNCM in person meeting with patient and her daughter during her H&P appointment. She is planning to have a left total knee arthroplasty with Dr. Lorin Mercy on 06/16/21. She is an Ortho bundle patient through [facility] and is agreeable to case management. She will be staying with her daughter after surgery and discharge home, who will be assisting her. She has a RW and 3in1/BSC for home use. No DME needed. She would like an ice man ice machine this time to help with pain and swelling. Will make Dr. Lorin Mercy aware. Anticipate HHPT will be needed after a short hospital stay. Referral made to [facility] after choice provided. Patient would like to go to [facility] in [place] when appropriate. CM will assist with getting this scheduled. Reviewed post op care instructions and copy provided to patient. Will continue to follow for needs. ? ? ?DME Arranged:   (Daughter reports she has a RW and 3in1/BSC) ?DME Agency:    ? ?HH Arranged:  PT ?[place] Agency:  [place] ? ?Additional Comments: ?Please contact me with any questions of if this plan should need to change. ? ?Jamse Arn, RN, BSN, [employer]  [phone] ?06/16/2021, 3:36 PM ?  ?

## 2021-06-16 NOTE — Anesthesia Procedure Notes (Signed)
Procedure Name: Intubation ?Date/Time: 06/16/2021 7:40 AM ?Performed by: Maude Leriche, CRNA ?Pre-anesthesia Checklist: Patient identified, Emergency Drugs available, Suction available and Patient being monitored ?Patient Re-evaluated:Patient Re-evaluated prior to induction ?Oxygen Delivery Method: Circle system utilized ?Preoxygenation: Pre-oxygenation with 100% oxygen ?Induction Type: IV induction ?Ventilation: Mask ventilation without difficulty ?Laryngoscope Size: Sabra Heck and 2 ?Grade View: Grade I ?Tube type: Oral ?Tube size: 7.0 mm ?Number of attempts: 1 ?Airway Equipment and Method: Stylet and Oral airway ?Placement Confirmation: ETT inserted through vocal cords under direct vision, positive ETCO2 and breath sounds checked- equal and bilateral ?Secured at: 21 cm ?Tube secured with: Tape ?Dental Injury: Teeth and Oropharynx as per pre-operative assessment  ? ? ? ? ?

## 2021-06-16 NOTE — Interval H&P Note (Signed)
History and Physical Interval Note: ? ?06/16/2021 ?7:27 AM ? ?Courtney Mcconnell  has presented today for surgery, with the diagnosis of OSTEOARTHRITIS LEFT KNEE.  The various methods of treatment have been discussed with the patient and family. After consideration of risks, benefits and other options for treatment, the patient has consented to  Procedure(s): ?LEFT TOTAL KNEE ARTHROPLASTY (Left) as a surgical intervention.  The patient's history has been reviewed, patient examined, no change in status, stable for surgery.  I have reviewed the patient's chart and labs.  Questions were answered to the patient's satisfaction.   ? ? ?[name] ? ? ?

## 2021-06-16 NOTE — Plan of Care (Signed)

## 2021-06-17 DIAGNOSIS — M1712 Unilateral primary osteoarthritis, left knee: Secondary | ICD-10-CM

## 2021-06-17 DIAGNOSIS — R569 Unspecified convulsions: Secondary | ICD-10-CM

## 2021-06-17 LAB — BASIC METABOLIC PANEL
Anion gap: 6 (ref 5–15)
BUN: 8 mg/dL (ref 8–23)
CO2: 24 mmol/L (ref 22–32)
Calcium: 8.4 mg/dL — ABNORMAL LOW (ref 8.9–10.3)
Chloride: 107 mmol/L (ref 98–111)
Creatinine, Ser: 0.93 mg/dL (ref 0.44–1.00)
GFR, Estimated: >60 mL/min (ref >60)
Glucose, Bld: 160 mg/dL — ABNORMAL HIGH (ref 70–99)
Potassium: 3.6 mmol/L (ref 3.5–5.1)
Sodium: 137 mmol/L (ref 135–145)

## 2021-06-17 LAB — CBC
HCT: 31.8 % — ABNORMAL LOW (ref 36.0–46.0)
Hemoglobin: 10.7 g/dL — ABNORMAL LOW (ref 12.0–15.0)
MCH: 31.6 pg (ref 26.0–34.0)
MCHC: 33.6 g/dL (ref 30.0–36.0)
MCV: 93.8 fL (ref 80.0–100.0)
Platelets: 192 10*3/uL (ref 150–400)
RBC: 3.39 MIL/uL — ABNORMAL LOW (ref 3.87–5.11)
RDW: 13.8 % (ref 11.5–15.5)
WBC: 8.8 10*3/uL (ref 4.0–10.5)
nRBC: 0 % (ref 0.0–0.2)

## 2021-06-17 MED ORDER — SODIUM CHLORIDE 0.9 % IV SOLN
300.0000 mg | Freq: Two times a day (BID) | INTRAVENOUS | Status: DC
  Administered 2021-06-17 – 2021-06-18 (×2): 300 mg via INTRAVENOUS
  Filled 2021-06-17 (×3): qty 30

## 2021-06-17 MED ORDER — ALUM & MAG HYDROXIDE-SIMETH 200-200-20 MG/5ML PO SUSP
15.0000 mL | Freq: Four times a day (QID) | ORAL | Status: DC | PRN
  Filled 2021-06-17: qty 30

## 2021-06-17 MED ORDER — LORAZEPAM 2 MG/ML IJ SOLN
0.5000 mg | Freq: Once | INTRAMUSCULAR | Status: AC
  Administered 2021-06-17: 0.5 mg via INTRAVENOUS
  Filled 2021-06-17: qty 1

## 2021-06-17 MED ORDER — ALUM & MAG HYDROXIDE-SIMETH 200-200-20 MG/5ML PO SUSP
30.0000 mL | Freq: Four times a day (QID) | ORAL | Status: DC | PRN
  Administered 2021-06-17 (×2): 30 mL via ORAL
  Filled 2021-06-17: qty 30

## 2021-06-17 NOTE — Care Management Obs Status (Signed)
MEDICARE OBSERVATION STATUS NOTIFICATION ? ? ?Patient Details  ?Name: Courtney Mcconnell ?MRN: [id number] ?Date of Birth: 1955/10/15 ? ? ?Medicare Observation Status Notification Given:  Yes ? ? ? ?Bartholomew Crews, RN ?06/17/2021, 2:31 PM ?

## 2021-06-17 NOTE — Consult Note (Signed)
Consultation Note ? ? ?Patient: Courtney Mcconnell XNT:[id number] DOB: 1955-12-26 PCP: Dettinger, Fransisca Kaufmann, MD ?DOA: 06/16/2021 ?DOS: the patient was seen and examined on 06/17/2021 ?Primary service: [name], MD ? ?Referring physician: Dr. Basil Dess, MD ?Reason for consult: repeated seizure ? ?Assessment/Plan: ?66 year old with history of epilepsy, CVA who is s/p total left knee arthroplasty yesterday who had 3 back to back recurrent general tonic-clonic seizures tonight.   ?She is on 2 different antiepileptics which are Vimpat and zonisamide.  She has only had slight changes in the timing of her regimen here while inpatient and family denies any missed doses.  She has also been on minimal amount of opioid postoperatively.  No electrolyte abnormalities ?Not convincing that slight changes in timing of her antiepileptics and low doses of opioids could cause her to have recurrent seizures.  Discussed with Dr. Louanne Skye that she would require neurology consult for further work-up and evaluation. ?Patient was more alert at the end of my evaluation and becoming more agitated and attempting to out of bed. Will give 0.'5mg'$  of ativan once and updated RN that neurology will be following. ? ? ?TRH will sign off at present, please call us again when needed. ? ?HPI: Courtney Mcconnell is a 66 y.o. female with past medical history of epilepsy on Vimpat and zonisamide, CAD s/p PCI, CVA who hospitalist is being consulted by orthopedic for repeated seizures this evening. ? ?Patient was admitted yesterday and is s/p left total knee arthroplasty.  Son at bedside provides history as patient is postictal on my evaluation.   ?He witnessed patient had 3-4 general tonic-clonic seizures with a few minutes of break in between without returning to baseline. ? ?He reports that normally she is on a strict time regimen with her antiepileptics and takes it at 6 AM and 6 PM.  However she had to take her dose yesterday at 5 AM prior to her surgery and did  not get her evening dose until 10 PM.  Denies any missed doses.  Her last seizure was at least a year ago and she is followed by neurology at [facility].  ?She has been receiving minimal amounts of opioids postoperatively including oxycodone IR and IV Dilaudid. ? ?Review of Systems: unable to review all systems due to the inability of the patient to answer questions. ?Past Medical History:  ?Diagnosis Date  ? Asthma   ? Bursitis   ? Hip  ? CAD (coronary artery disease) 02/11/2019  ? PCI/DES x1 to mLAD, focal ostial 70% lesion in 1st diag  ? COPD (chronic obstructive pulmonary disease) ([place])   ? Essential hypertension   ? GERD (gastroesophageal reflux disease)   ? Headache(784.0)   ? Hyperlipidemia   ? NSTEMI (non-ST elevated myocardial infarction) ([place]) 02/11/2019  ? Pneumonia   ? Seizure ([place])   ? Sleep apnea   ? ?Past Surgical History:  ?Procedure Laterality Date  ? ABDOMINAL HYSTERECTOMY    ? APPENDECTOMY    ? CATARACT EXTRACTION Bilateral   ? CHOLECYSTECTOMY    ? CORONARY STENT INTERVENTION N/A 02/11/2019  ? Procedure: CORONARY STENT INTERVENTION;  Surgeon: Burnell Blanks, MD;  Location: [place] [facility];; mid LAD 70% -- DES PCI (Synergy DES 2.5 x 28 --2.8 mm)  ? heal spur    ? right  ? INTRAVASCULAR PRESSURE WIRE/FFR STUDY N/A 02/11/2019  ? Procedure: INTRAVASCULAR PRESSURE WIRE/FFR STUDY;  Surgeon: Burnell Blanks, MD;  Location: [place] [facility];  Service: Cardiovascular;  Laterality:  N/A;  ? LASER PHOTO ABLATION Right 08/27/2013  ? Procedure: LASER PHOTO ABLATION;  Surgeon: Hayden Pedro, MD;  Location: [place];  Service: Ophthalmology;  Laterality: Right;  Headscope laser AND Endolaser  ? LEFT HEART CATH AND CORONARY ANGIOGRAPHY N/A 02/11/2019  ? Procedure: LEFT HEART CATH AND CORONARY ANGIOGRAPHY;  Surgeon: Burnell Blanks, MD;  Location: [place] INVASIVE [facility];;; prox RCA 20%. Ost-prox Cx 20%. ostial D1 70% & mLAD 70% => (DES PCI of LAD)  ? MEMBRANE PEEL Right 08/27/2013  ?  Procedure: MEMBRANE PEEL;  Surgeon: Hayden Pedro, MD;  Location: [place];  Service: Ophthalmology;  Laterality: Right;  ? PARS PLANA VITRECTOMY Right 08/27/2013  ? Procedure: PARS PLANA VITRECTOMY WITH 25 GAUGE;  Surgeon: Hayden Pedro, MD;  Location: [place];  Service: Ophthalmology;  Laterality: Right;  ? TOTAL KNEE ARTHROPLASTY Right 01/09/2021  ? Procedure: RIGHT TOTAL KNEE ARTHROPLASTY;  Surgeon: [name], MD;  Location: [place];  Service: Orthopedics;  Laterality: Right;  Needs RNFA  ? ?Social History:  reports that she quit smoking about 7 years ago. Her smoking use included cigarettes. She has a 10.50 pack-year smoking history. She has never used smokeless tobacco. She reports that she does not drink alcohol and does not use drugs. ? ?Allergies  ?Allergen Reactions  ? Penicillins Hives  ? Bee Venom Swelling  ? Prednisone Other (See Comments)  ?  Patient did not like how it made her feel, caused jittery feeling.   ? Augmentin [Amoxicillin-Pot Clavulanate] Rash  ? Doxycycline Hyclate Itching and Rash  ? ? ?Family History  ?Problem Relation Age of Onset  ? COPD Mother   ? Diabetes Father   ? Hypertension Father   ? Hyperlipidemia Father   ? Cancer Sister   ?     Bone Cancer  ? Diabetes Brother   ? Cancer Brother   ? Cancer Paternal Uncle   ? Stroke Paternal Grandmother   ? Healthy Daughter   ? Healthy Son   ? Colon cancer Neg Hx   ? ? ?Prior to Admission medications   ?Medication Sig Start Date End Date Taking? Authorizing Provider  ?acetaminophen (TYLENOL) 500 MG tablet Take 1,500 mg by mouth every 6 (six) hours as needed.   Yes [provider]  ?ADVAIR DISKUS 100-50 MCG/DOSE AEPB inhale ONE PUFF into THE lungs IN THE MORNING AND IN THE EVENING ?Patient taking differently: Inhale 1 puff into the lungs 2 (two) times daily. 05/16/20  Yes Dettinger, Fransisca Kaufmann, MD  ?aspirin EC 81 MG tablet Take 81 mg by mouth daily. Swallow whole.   Yes [provider]  ?atorvastatin (LIPITOR) 40 MG tablet TAKE 1  TABLET BY MOUTH DAILY AT 6PM 12/22/20  Yes Satira Sark, MD  ?carvedilol (COREG) 3.125 MG tablet TAKE 1 TABLET BY MOUTH TWICE DAILY WITH A MEAL 11/14/20  Yes Leonie Man, MD  ?Lacosamide (VIMPAT) 150 MG TABS Take 300 mg by mouth 2 (two) times daily. 11/02/19  Yes [provider]  ?losartan (COZAAR) 25 MG tablet TAKE 1 TABLET BY MOUTH EVERY DAY 05/15/21  Yes Leonie Man, MD  ?omeprazole (PRILOSEC OTC) 20 MG tablet Take 20 mg by mouth 2 (two) times daily.   Yes [provider]  ?oxyCODONE-acetaminophen (PERCOCET/ROXICET) 5-325 MG tablet Take 1-2 tablets by mouth every 6 (six) hours as needed for severe pain. 06/16/21  Yes Lanae Crumbly, PA-C  ?rivaroxaban (XARELTO) 10 MG TABS tablet Take 1 tablet (10 mg  total) by mouth daily. MUST TAKE 4-5 WEEKS POSTOP FOR DVT (BLOOD CLOT) PROPHYLAXIS 06/16/21  Yes Lanae Crumbly, PA-C  ?SPIRIVA HANDIHALER 18 MCG inhalation capsule INHALE 1 PUFF BY MOUTH DAILY ?Patient taking differently: Place 18 mcg into inhaler and inhale daily. 11/14/20  Yes Dettinger, Fransisca Kaufmann, MD  ?zonisamide (ZONEGRAN) 100 MG capsule Take 200 mg by mouth at bedtime. 07/18/20  Yes [provider]  ?albuterol (VENTOLIN HFA) 108 (90 Base) MCG/ACT inhaler INHALE TWO PUFFS INTO THE LUNGS EVERY 6 HOURS AS NEEDED FOR WHEEZING OR SHORTNESS OF BREATH 06/10/20   Dettinger, Fransisca Kaufmann, MD  ?benzonatate (TESSALON) 100 MG capsule Take 1 capsule (100 mg total) by mouth every 8 (eight) hours. ?Patient not taking: Reported on 06/02/2021 04/06/21   Tonye Pearson, PA-C  ?diclofenac Sodium (VOLTAREN) 1 % GEL Apply 2 g topically 4 (four) times daily. ?Patient taking differently: Apply 2 g topically 2 (two) times daily as needed (knee pain). 04/14/20   Dettinger, Fransisca Kaufmann, MD  ?methocarbamol (ROBAXIN) 500 MG tablet TAKE 1 TABLET BY MOUTH EVERY 8 HOURS AS NEEDED FOR FOR MUSCLE SPASMS 01/30/21   [name], MD  ?methocarbamol (ROBAXIN) 500 MG tablet Take 1 tablet (500 mg total) by mouth every 6 (six)  hours as needed for muscle spasms. 06/16/21   Lanae Crumbly, PA-C  ?nitroGLYCERIN (NITROSTAT) 0.4 MG SL tablet Place 1 tablet (0.4 mg total) under the tongue every 5 (five) minutes as needed for chest pain. (NE

## 2021-06-17 NOTE — Progress Notes (Signed)
Physical Therapy Treatment ?Patient Details ?Name: Courtney Mcconnell ?MRN: [id number] ?DOB: 09-Nov-1955 ?Today's Date: 06/17/2021 ? ? ?History of Present Illness Pt adm 5/5 for lt TKR. PMH - Rt TKR, seizure, OA, RA, NSTEMI, sleep apnea ? ?  ?PT Comments  ? ? Pt up in recliner with son in room on entry. Pt reports she was just up to the bathroom and that went well. Agreeable to seated exercise and ambulation with therapy. Pt able to progress 10 feet past ambulation this morning and she and son are happy with progress. Pt and family hopeful for discharge tomorrow. Pt will need stair training prior to discharge. ?   ?Recommendations for follow up therapy are one component of a multi-disciplinary discharge planning process, led by the attending physician.  Recommendations may be updated based on patient status, additional functional criteria and insurance authorization. ? ?Follow Up Recommendations ? Follow physician's recommendations for discharge plan and follow up therapies ?  ?  ?Assistance Recommended at Discharge Intermittent Supervision/Assistance  ?Patient can return home with the following A little help with walking and/or transfers;Help with stairs or ramp for entrance;A little help with bathing/dressing/bathroom;Assist for transportation ?  ?Equipment Recommendations ? None recommended by PT  ?  ?   ?Precautions / Restrictions Precautions ?Precautions: Fall;Knee ?Required Braces or Orthoses: Knee Immobilizer - Left ?Knee Immobilizer - Left: On except when in CPM ?Restrictions ?Weight Bearing Restrictions: Yes  ?  ? ?Mobility ? Bed Mobility ?  ?  ?  ?  ?  ?  ?  ?General bed mobility comments: OOB in recliner ?  ? ?Transfers ?Overall transfer level: Needs assistance ?Equipment used: Rolling walker (2 wheels) ?Transfers: Sit to/from Stand, Bed to chair/wheelchair/BSC ?Sit to Stand: Min assist, +2 physical assistance ?  ?  ?  ?  ?  ?General transfer comment: Son present and wanting to assist pt to standing, encourage pt  to try on her own and she was able to power up needing min A for bringing L UE to RW, and steady ?  ? ?Ambulation/Gait ?Ambulation/Gait assistance: Min guard ?Gait Distance (Feet): 55 Feet (with family chair follow for encouragement and progression of gait) ?Assistive device: Rolling walker (2 wheels) ?Gait Pattern/deviations: Step-to pattern, Decreased step length - right, Decreased stance time - left, Shuffle ?Gait velocity: decr ?Gait velocity interpretation: <1.31 ft/sec, indicative of household ambulator ?  ?General Gait Details: min guard for safety, encouraging progression to step through gait pattern, pt admits it has been a long time since she was able to. Pt able to step with toes of contralateral foot slightly in front ? ? ? ?  ? ? ?  ?Balance Overall balance assessment: Mild deficits observed, not formally tested ?  ?  ?  ?  ?  ?  ?  ?  ?  ?  ?  ?  ?  ?  ?  ?  ?  ?  ?  ? ?  ?Cognition Arousal/Alertness: Awake/alert ?Behavior During Therapy: [facility] for tasks assessed/performed ?Overall Cognitive Status: Within Functional Limits for tasks assessed ?  ?  ?  ?  ?  ?  ?  ?  ?  ?  ?  ?  ?  ?  ?  ?  ?  ?  ?  ? ?  ?Exercises Total Joint Exercises ?Ankle Circles/Pumps: AROM, Both, 10 reps, Supine ?Straight Leg Raises: AAROM, Seated, 5 reps, Left ?Long Arc Quad: AAROM, Left, 5 reps, Seated ?Knee Flexion: AAROM, 5 reps, Seated ? ?  ?  General Comments General comments (skin integrity, edema, etc.): son present during session, helpful with encouragement ?  ?  ? ?Pertinent Vitals/Pain Pain Assessment ?Pain Assessment: 0-10 ?Pain Score: 5  ?Pain Location: lt knee ?Pain Descriptors / Indicators: Guarding, Grimacing ?Pain Intervention(s): Limited activity within patient's tolerance, Monitored during session, Repositioned  ? ? ? ?PT Goals (current goals can now be found in the care plan section) Acute Rehab PT Goals ?Patient Stated Goal: return to a more active lifestyle ?PT Goal Formulation: With patient ?Time For Goal  Achievement: 06/20/21 ?Potential to Achieve Goals: Good ?Progress towards PT goals: Progressing toward goals ? ?  ?Frequency ? ? ? 7X/week ? ? ? ?  ?PT Plan Current plan remains appropriate  ? ? ?   ?AM-PAC PT "6 Clicks" Mobility   ?Outcome Measure ? Help needed turning from your back to your side while in a flat bed without using bedrails?: None ?Help needed moving from lying on your back to sitting on the side of a flat bed without using bedrails?: A Little ?Help needed moving to and from a bed to a chair (including a wheelchair)?: A Little ?Help needed standing up from a chair using your arms (e.g., wheelchair or bedside chair)?: A Little ?Help needed to walk in hospital room?: A Little ?Help needed climbing 3-5 steps with a railing? : Total ?6 Click Score: 17 ? ?  ?End of Session Equipment Utilized During Treatment: Gait belt ?Activity Tolerance: Patient tolerated treatment well ?Patient left: in chair;with call bell/phone within reach;with chair alarm set ?Nurse Communication: Mobility status ?PT Visit Diagnosis: Other abnormalities of gait and mobility (R26.89);Pain ?Pain - Right/Left: Left ?Pain - part of body: Knee ?  ? ? ?Time: 7253-6644 ?PT Time Calculation (min) (ACUTE ONLY): 31 min ? ?Charges:  $Gait Training: 8-22 mins ?$Therapeutic Exercise: 8-22 mins ?$Therapeutic Activity: 8-22 mins          ?          ? ?Savannah Morford B. Migdalia Dk PT, DPT ?Acute Rehabilitation Services ?Please use secure chat or  ?Call Office [phone] ? ? ? ?Bailey Mech Fleet ?06/17/2021, 3:04 PM ? ?

## 2021-06-17 NOTE — Progress Notes (Signed)
? ? ? ?  Subjective: ?1 Day Post-Op Procedure(s) (LRB): ?LEFT TOTAL KNEE ARTHROPLASTY (Left) ?Awake, alert and oriented x 4. OOB to a recliner.  ?Voiding without difficulty.  ? ?Patient reports pain as moderate to severe.   ? ?Objective:  ? ?VITALS:  Temp:  [98 ?F (36.7 ?C)-98.6 ?F (37 ?C)] 98 ?F (36.7 ?C) (05/06 0741) ?Pulse Rate:  [70-75] 73 (05/06 0741) ?Resp:  [17-20] 17 (05/06 0741) ?BP: (114-160)/(51-83) 147/73 (05/06 0741) ?SpO2:  [97 %-100 %] 100 % (05/06 0836) ? ?Neurologically intact ?ABD soft ?Neurovascular intact ?Sensation intact distally ?Intact pulses distally ?Dorsiflexion/Plantar flexion intact ?Incision: dressing C/D/I ? ? ?LABS ?Recent Labs  ?  06/17/21 ?0229  ?HGB 10.7*  ?WBC 8.8  ?PLT 192  ? ?Recent Labs  ?  06/17/21 ?0229  ?NA 137  ?K 3.6  ?CL 107  ?CO2 24  ?BUN 8  ?CREATININE 0.93  ?GLUCOSE 160*  ? ?No results for input(s): LABPT, INR in the last 72 hours. ? ? ?Assessment/Plan: ?1 Day Post-Op Procedure(s) (LRB): ?LEFT TOTAL KNEE ARTHROPLASTY (Left) ? ?Advance diet ?Up with therapy ?D/C IV fluids ?Partipate with PT and OT, she has COPD, is experiencing significant post op pain ?Check on her progress tomorrow to decide on status for planning post hospitalization.  ? ?Basil Dess ?06/17/2021, 1:14 PM Patient ID: Courtney Mcconnell, female   DOB: Oct 20, 1955, 66 y.o.   MRN: [id number] ? ?

## 2021-06-17 NOTE — Progress Notes (Signed)
Physical Therapy Treatment ?Patient Details ?Name: Courtney Mcconnell ?MRN: [id number] ?DOB: 29-Jul-1955 ?Today's Date: 06/17/2021 ? ? ?History of Present Illness Pt adm 5/5 for lt TKR. PMH - Rt TKR, seizure, OA, RA, NSTEMI, sleep apnea ? ?  ?PT Comments  ? ? Pt is progressing towards goals. She increased gait distance and completed supine HEP training. Pt's daughter present and assisting with transfers and peri care. 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 Pt with shuffling gait and would likely have great difficulty performing stair training today. Pt also reports she does not feel capable of going home yet. Will continue to follow acutely for mobility progression. ?  ?Recommendations for follow up therapy are one component of a multi-disciplinary discharge planning process, led by the attending physician.  Recommendations may be updated based on patient status, additional functional criteria and insurance authorization. ? ?Follow Up Recommendations ? Follow physician's recommendations for discharge plan and follow up therapies ?  ?  ?Assistance Recommended at Discharge Intermittent Supervision/Assistance  ?Patient can return home with the following A little help with walking and/or transfers;Help with stairs or ramp for entrance;A little help with bathing/dressing/bathroom;Assist for transportation ?  ?Equipment Recommendations ? None recommended by PT  ?  ?Recommendations for Other Services   ? ? ?  ?Precautions / Restrictions Precautions ?Precautions: Fall;Knee ?Required Braces or Orthoses: Knee Immobilizer - Left ?Knee Immobilizer - Left: On except when in CPM ?Restrictions ?Weight Bearing Restrictions: Yes  ?  ? ?Mobility ? Bed Mobility ?Overal bed mobility: Needs Assistance ?Bed Mobility: Supine to Sit ?  ?  ?Supine to sit: HOB elevated, Supervision ?  ?  ?General bed mobility comments: able to progress to EOB w/o assist. HOB elevated with use of bedrails ?  ? ?Transfers ?Overall transfer level: Needs  assistance ?Equipment used: Rolling walker (2 wheels) ?Transfers: Sit to/from Stand, Bed to chair/wheelchair/BSC ?Sit to Stand: Mod assist ?  ?Step pivot transfers: Min guard ?  ?  ?  ?General transfer comment: Daughter present and assisted pt into standing via HHA with pt pulling up on daughters arms. This is her prefered method and one they used at home with past sx. close min guard for pivot transfer to [facility] ?  ? ?Ambulation/Gait ?Ambulation/Gait assistance: Min guard ?Gait Distance (Feet): 45 Feet (with chair to follow to increase distance) ?Assistive device: Rolling walker (2 wheels) ?Gait Pattern/deviations: Step-to pattern, Decreased step length - right, Decreased stance time - left, Shuffle ?Gait velocity: decr ?Gait velocity interpretation: <1.31 ft/sec, indicative of household ambulator ?  ?General Gait Details: min guard for safety with daughter pushing chiar behind to promote increased distance. Pt with report of fatigue, dizziness, and pain at the end of gait training. ? ? ?Stairs ?  ?  ?  ?  ?  ? ? ?Wheelchair Mobility ?  ? ?Modified Rankin (Stroke Patients Only) ?  ? ? ?  ?Balance Overall balance assessment: Mild deficits observed, not formally tested ?  ?  ?  ?  ?  ?  ?  ?  ?  ?  ?  ?  ?  ?  ?  ?  ?  ?  ?  ? ?  ?Cognition Arousal/Alertness: Awake/alert ?Behavior During Therapy: [facility] for tasks assessed/performed ?Overall Cognitive Status: Within Functional Limits for tasks assessed ?  ?  ?  ?  ?  ?  ?  ?  ?  ?  ?  ?  ?  ?  ?  ?  ?  ?  ?  ? ?  ?  Exercises Total Joint Exercises ?Ankle Circles/Pumps: AROM, Both, 10 reps, Supine ?Towel Squeeze: AROM, Left, 5 reps, Supine ?Short Arc Quad: AROM, Left, 5 reps, Supine ?Heel Slides: AROM, Left, 5 reps, Supine ?Hip ABduction/ADduction: AROM, Left, 5 reps, Supine ?Knee Flexion: AAROM, 5 reps, Seated ? ?  ?General Comments General comments (skin integrity, edema, etc.): daughter present and very helpful throughout session. 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 ?  ?   ? ?Pertinent Vitals/Pain Pain Assessment ?Pain Assessment: 0-10 ?Pain Score: 4  ?Pain Location: lt knee ?Pain Descriptors / Indicators: Guarding, Grimacing ?Pain Intervention(s): Monitored during session, Limited activity within patient's tolerance, Repositioned  ? ? ?Home Living   ?  ?  ?  ?  ?  ?  ?  ?  ?  ?   ?  ?Prior Function    ?  ?  ?   ? ?PT Goals (current goals can now be found in the care plan section) Acute Rehab PT Goals ?Patient Stated Goal: return to a more active lifestyle ?PT Goal Formulation: With patient ?Time For Goal Achievement: 06/20/21 ?Potential to Achieve Goals: Good ?Progress towards PT goals: Progressing toward goals ? ?  ?Frequency ? ? ? 7X/week ? ? ? ?  ?PT Plan Current plan remains appropriate  ? ? ?Co-evaluation   ?  ?  ?  ?  ? ?  ?AM-PAC PT "6 Clicks" Mobility   ?Outcome Measure ? Help needed turning from your back to your side while in a flat bed without using bedrails?: None ?Help needed moving from lying on your back to sitting on the side of a flat bed without using bedrails?: A Little ?Help needed moving to and from a bed to a chair (including a wheelchair)?: A Little ?Help needed standing up from a chair using your arms (e.g., wheelchair or bedside chair)?: A Little ?Help needed to walk in hospital room?: A Little ?Help needed climbing 3-5 steps with a railing? : Total ?6 Click Score: 17 ? ?  ?End of Session Equipment Utilized During Treatment: Gait belt ?Activity Tolerance: Patient tolerated treatment well ?Patient left: in chair;with call bell/phone within reach;with chair alarm set ?Nurse Communication: Mobility status ?PT Visit Diagnosis: Other abnormalities of gait and mobility (R26.89);Pain ?Pain - Right/Left: Left ?Pain - part of body: Knee ?  ? ? ?Time: 1040-1120 ?PT Time Calculation (min) (ACUTE ONLY): 40 min ? ?Charges:  $Gait Training: 8-22 mins ?$Therapeutic Exercise: 8-22 mins ?$Therapeutic Activity: 8-22 mins          ?          ? ?Benjiman Core, PTA ?Acute  Rehab ? ?Allena Katz ?06/17/2021, 12:34 PM ? ?

## 2021-06-17 NOTE — Consult Note (Signed)
?                    NEURO HOSPITALIST CONSULT NOTE  ? ?Requestig physician: Dr. Louanne Skye ? ?Reason for Consult: Breakthrough seizure ? ?History obtained from:  Orthopaedics Attending, RN, Son and Chart    ? ?HPI:                                                                                                                                         ? Courtney Mcconnell is an 66 y.o. female with a history of epilepsy who is on the Orthopaedics service, s/p left TKA yesterday, also with a PMHx of CAD, COPD, HTN, HLD, headaches and sleep apnea, who had 3 breakthrough seizures this evening. She did miss her morning dose of Vimpat at home on the day of admission (Friday), but has received all of her scheduled doses as an inpatient.  ? ?Her home anticonvulsant regimen consists of Zonegran 200 mg qhs and Vimpat 300 mg BID.  ? ?RN note reviewed: "This patient had 3 seizures back to back tonight. Rapid Response was called after the first seizure and Dr. Basil Dess was notified immediately. Dr. Louanne Skye came to the patient's bedside and consulted the Hospitalist and Neurology. Both the Hopitalist and Neuro were present at the patient's bedside. Patient's vital signs remained stable. Patient's son at bedside. Post-evaluation: Patient became agitated and restless and stated pulling at cords and trying to get out of bed. The Hospitalist was informed and Ativan 0.'5mg'$  (one dose) was ordered and given. Patient is now resting and being monitored by this nurse and staff. Patient's son is still at bedside and given support." ? ? ?Past Medical History:  ?Diagnosis Date  ? Asthma   ? Bursitis   ? Hip  ? CAD (coronary artery disease) 02/11/2019  ? PCI/DES x1 to mLAD, focal ostial 70% lesion in 1st diag  ? COPD (chronic obstructive pulmonary disease) ([place])   ? Essential hypertension   ? GERD (gastroesophageal reflux disease)   ? Headache(784.0)   ? Hyperlipidemia   ? NSTEMI (non-ST elevated myocardial infarction) ([place]) 02/11/2019  ? Pneumonia   ?  Seizure ([place])   ? Sleep apnea   ? ? ?Past Surgical History:  ?Procedure Laterality Date  ? ABDOMINAL HYSTERECTOMY    ? APPENDECTOMY    ? CATARACT EXTRACTION Bilateral   ? CHOLECYSTECTOMY    ? CORONARY STENT INTERVENTION N/A 02/11/2019  ? Procedure: CORONARY STENT INTERVENTION;  Surgeon: Burnell Blanks, MD;  Location: [place] [facility];; mid LAD 70% -- DES PCI (Synergy DES 2.5 x 28 --2.8 mm)  ? heal spur    ? right  ? INTRAVASCULAR PRESSURE WIRE/FFR STUDY N/A 02/11/2019  ? Procedure: INTRAVASCULAR PRESSURE WIRE/FFR STUDY;  Surgeon: Burnell Blanks, MD;  Location: [place] [facility];  Service: Cardiovascular;  Laterality: N/A;  ? LASER PHOTO ABLATION Right  08/27/2013  ? Procedure: LASER PHOTO ABLATION;  Surgeon: Hayden Pedro, MD;  Location: [place];  Service: Ophthalmology;  Laterality: Right;  Headscope laser AND Endolaser  ? LEFT HEART CATH AND CORONARY ANGIOGRAPHY N/A 02/11/2019  ? Procedure: LEFT HEART CATH AND CORONARY ANGIOGRAPHY;  Surgeon: Burnell Blanks, MD;  Location: [place] IN[facility]AS[facility];;; prox RCA 20%. Ost-prox Cx 20%. ostial D1 70% & mLAD 70% => (DES PCI of LAD)  ? MEMBRANE PEEL Right 08/27/2013  ? Procedure: MEMBRANE PEEL;  Surgeon: Hayden Pedro, MD;  Location: [place];  Service: Ophthalmology;  Laterality: Right;  ? PARS PLANA VITRECTOMY Right 08/27/2013  ? Procedure: PARS PLANA VITRECTOMY WITH 25 GAUGE;  Surgeon: Hayden Pedro, MD;  Location: [place];  Service: Ophthalmology;  Laterality: Right;  ? TOTAL KNEE ARTHROPLASTY Right 01/09/2021  ? Procedure: RIGHT TOTAL KNEE ARTHROPLASTY;  Surgeon: [name], MD;  Location: [place];  Service: Orthopedics;  Laterality: Right;  Needs RNFA  ? ? ?Family History  ?Problem Relation Age of Onset  ? COPD Mother   ? Diabetes Father   ? Hypertension Father   ? Hyperlipidemia Father   ? Cancer Sister   ?     Bone Cancer  ? Diabetes Brother   ? Cancer Brother   ? Cancer Paternal Uncle   ? Stroke Paternal Grandmother   ? Healthy Daughter   ?  Healthy Son   ? Colon cancer Neg Hx   ?          ? ?Social History:  reports that she quit smoking about 7 years ago. Her smoking use included cigarettes. She has a 10.50 pack-year smoking history. She has never used smokeless tobacco. She reports that she does not drink alcohol and does not use drugs. ? ?Allergies  ?Allergen Reactions  ? Penicillins Hives  ? Bee Venom Swelling  ? Prednisone Other (See Comments)  ?  Patient did not like how it made her feel, caused jittery feeling.   ? Augmentin [Amoxicillin-Pot Clavulanate] Rash  ? Doxycycline Hyclate Itching and Rash  ? ? ?MEDICATIONS:                                                                                                                     ?Prior to Admission:  ?Medications Prior to Admission  ?Medication Sig Dispense Refill Last Dose  ? acetaminophen (TYLENOL) 500 MG tablet Take 1,500 mg by mouth every 6 (six) hours as needed.   06/16/2021 at 0330  ? ADVAIR DISKUS 100-50 MCG/DOSE AEPB inhale ONE PUFF into THE lungs IN THE MORNING AND IN THE EVENING (Patient taking differently: Inhale 1 puff into the lungs 2 (two) times daily.) 60 each 2 06/15/2021  ? aspirin EC 81 MG tablet Take 81 mg by mouth daily. Swallow whole.   06/08/2021  ? atorvastatin (LIPITOR) 40 MG tablet TAKE 1 TABLET BY MOUTH DAILY AT 6PM 90 tablet 3 06/15/2021  ? carvedilol (COREG) 3.125 MG tablet TAKE 1 TABLET BY MOUTH  TWICE DAILY WITH A MEAL 60 tablet 11 06/16/2021 at 0330  ? Lacosamide (VIMPAT) 150 MG TABS Take 300 mg by mouth 2 (two) times daily.   06/16/2021 at 0330  ? losartan (COZAAR) 25 MG tablet TAKE 1 TABLET BY MOUTH EVERY DAY 60 tablet 5 06/16/2021 at 0330  ? omeprazole (PRILOSEC OTC) 20 MG tablet Take 20 mg by mouth 2 (two) times daily.   06/16/2021 at 0330  ? SPIRIVA HANDIHALER 18 MCG inhalation capsule INHALE 1 PUFF BY MOUTH DAILY (Patient taking differently: Place 18 mcg into inhaler and inhale daily.) 90 capsule 0 Past Week  ? zonisamide (ZONEGRAN) 100 MG capsule Take 200 mg by mouth at  bedtime.   06/15/2021  ? albuterol (VENTOLIN HFA) 108 (90 Base) MCG/ACT inhaler INHALE TWO PUFFS INTO THE LUNGS EVERY 6 HOURS AS NEEDED FOR WHEEZING OR SHORTNESS OF BREATH 18 g 0 Unknown  ? benzonatate (TESSALON) 100 MG capsule Take 1 capsule (100 mg total) by mouth every 8 (eight) hours. (Patient not taking: Reported on 06/02/2021) 21 capsule 0 Not Taking  ? diclofenac Sodium (VOLTAREN) 1 % GEL Apply 2 g topically 4 (four) times daily. (Patient taking differently: Apply 2 g topically 2 (two) times daily as needed (knee pain).) 350 g 3 Unknown  ? methocarbamol (ROBAXIN) 500 MG tablet TAKE 1 TABLET BY MOUTH EVERY 8 HOURS AS NEEDED FOR FOR MUSCLE SPASMS 30 tablet 1 Unknown  ? nitroGLYCERIN (NITROSTAT) 0.4 MG SL tablet Place 1 tablet (0.4 mg total) under the tongue every 5 (five) minutes as needed for chest pain. (NEEDS TO BE SEEN BEFORE NEXT REFILL) 30 tablet 0 Unknown  ? rivaroxaban (XARELTO) 20 MG TABS tablet Take 1 tablet (20 mg total) by mouth daily with supper. (Patient not taking: Reported on 04/06/2021) 15 tablet 0 Not Taking  ? ?Scheduled: ? atorvastatin  40 mg Oral Daily  ? carvedilol  3.125 mg Oral BID WC  ? docusate sodium  100 mg Oral BID  ? losartan  25 mg Oral Daily  ? mometasone-formoterol  2 puff Inhalation BID  ? pantoprazole  40 mg Oral BID  ? rivaroxaban  10 mg Oral Q breakfast  ? umeclidinium bromide  1 puff Inhalation Daily  ? zonisamide  200 mg Oral QHS  ? ?Continuous: ? sodium chloride 85 mL/hr at 06/16/21 1357  ? lacosamide (VIMPAT) IV    ? methocarbamol (ROBAXIN) IV    ? ? ? ?ROS:                                                                                                                                       ?Unable to obtain due to postictal state.  ? ? ?Blood pressure 134/68, pulse 83, temperature 98.3 ?F (36.8 ?C), resp. rate 18, height '5\' 6"'$  (1.676 m), weight 102.5 kg, SpO2 100 %. ? ? ?General Examination:                                                                                                       ? ?  Physical Exam  ?HEENT-  [place]/AT    ?Lungs- Respirations unlabored ?Extremities- Brace on LLE s/p left TKA ? ? ?Neurological Examination ?Mental Status: Postictal with drowsiness, minimal

## 2021-06-17 NOTE — Progress Notes (Signed)
Patient ID: Courtney Mcconnell, female   DOB: [birth year], 66 y.o.   MRN: [id number] ?66 year old female with history of previous CVA, NSTelevation MI, CAD with seizure disorder. ?Has focal epilepsy with impairment of consciousness historyl HTN. ?Nursing has called and I have seen her. ?Presently she is obtunded and not responding to speech or touch, Breathing deep breaths.  ?Oxygen saturation is 100% on 3 liters pnc. ?Previous left total knee replacment POD #1. ?She has open airway and there is no obstruction to her breathing. ?Family concerned mainly due to missing one dose of her antiseizure med while NPO for ?Surgery yesterday and chang in the normal schedule of her meds, normally taken at 6 am and 6 pm.  ?POD#1 Left TKR ?Multiple seizures tonight, may be due to decreased antiseizure medication levels. ?Plan: I discussed the patient the [facility] and she will help [place] to manage this condition ?This evening.  ?

## 2021-06-18 ENCOUNTER — Observation Stay (HOSPITAL_COMMUNITY): Payer: Medicare Other

## 2021-06-18 DIAGNOSIS — M0579 Rheumatoid arthritis with rheumatoid factor of multiple sites without organ or systems involvement: Secondary | ICD-10-CM | POA: Diagnosis present

## 2021-06-18 DIAGNOSIS — R569 Unspecified convulsions: Secondary | ICD-10-CM | POA: Diagnosis not present

## 2021-06-18 DIAGNOSIS — Z8249 Family history of ischemic heart disease and other diseases of the circulatory system: Secondary | ICD-10-CM | POA: Diagnosis not present

## 2021-06-18 DIAGNOSIS — Z881 Allergy status to other antibiotic agents status: Secondary | ICD-10-CM | POA: Diagnosis not present

## 2021-06-18 DIAGNOSIS — I2510 Atherosclerotic heart disease of native coronary artery without angina pectoris: Secondary | ICD-10-CM | POA: Diagnosis present

## 2021-06-18 DIAGNOSIS — Z808 Family history of malignant neoplasm of other organs or systems: Secondary | ICD-10-CM | POA: Diagnosis not present

## 2021-06-18 DIAGNOSIS — Z825 Family history of asthma and other chronic lower respiratory diseases: Secondary | ICD-10-CM | POA: Diagnosis not present

## 2021-06-18 DIAGNOSIS — Z823 Family history of stroke: Secondary | ICD-10-CM | POA: Diagnosis not present

## 2021-06-18 DIAGNOSIS — K219 Gastro-esophageal reflux disease without esophagitis: Secondary | ICD-10-CM | POA: Diagnosis present

## 2021-06-18 DIAGNOSIS — M1712 Unilateral primary osteoarthritis, left knee: Secondary | ICD-10-CM | POA: Diagnosis present

## 2021-06-18 DIAGNOSIS — E785 Hyperlipidemia, unspecified: Secondary | ICD-10-CM | POA: Diagnosis present

## 2021-06-18 DIAGNOSIS — Z7951 Long term (current) use of inhaled steroids: Secondary | ICD-10-CM | POA: Diagnosis not present

## 2021-06-18 DIAGNOSIS — Z880 Allergy status to penicillin: Secondary | ICD-10-CM | POA: Diagnosis not present

## 2021-06-18 DIAGNOSIS — I252 Old myocardial infarction: Secondary | ICD-10-CM | POA: Diagnosis not present

## 2021-06-18 DIAGNOSIS — Z86718 Personal history of other venous thrombosis and embolism: Secondary | ICD-10-CM | POA: Diagnosis not present

## 2021-06-18 DIAGNOSIS — Z79899 Other long term (current) drug therapy: Secondary | ICD-10-CM | POA: Diagnosis not present

## 2021-06-18 DIAGNOSIS — Z7982 Long term (current) use of aspirin: Secondary | ICD-10-CM | POA: Diagnosis not present

## 2021-06-18 DIAGNOSIS — Z833 Family history of diabetes mellitus: Secondary | ICD-10-CM | POA: Diagnosis not present

## 2021-06-18 DIAGNOSIS — I10 Essential (primary) hypertension: Secondary | ICD-10-CM | POA: Diagnosis present

## 2021-06-18 DIAGNOSIS — Z955 Presence of coronary angioplasty implant and graft: Secondary | ICD-10-CM | POA: Diagnosis not present

## 2021-06-18 DIAGNOSIS — Z8673 Personal history of transient ischemic attack (TIA), and cerebral infarction without residual deficits: Secondary | ICD-10-CM | POA: Diagnosis not present

## 2021-06-18 DIAGNOSIS — G40409 Other generalized epilepsy and epileptic syndromes, not intractable, without status epilepticus: Secondary | ICD-10-CM | POA: Diagnosis present

## 2021-06-18 DIAGNOSIS — Z96651 Presence of right artificial knee joint: Secondary | ICD-10-CM | POA: Diagnosis present

## 2021-06-18 DIAGNOSIS — Z83438 Family history of other disorder of lipoprotein metabolism and other lipidemia: Secondary | ICD-10-CM | POA: Diagnosis not present

## 2021-06-18 DIAGNOSIS — J439 Emphysema, unspecified: Secondary | ICD-10-CM | POA: Diagnosis present

## 2021-06-18 LAB — CBC
HCT: 32.8 % — ABNORMAL LOW (ref 36.0–46.0)
Hemoglobin: 10.6 g/dL — ABNORMAL LOW (ref 12.0–15.0)
MCH: 30.8 pg (ref 26.0–34.0)
MCHC: 32.3 g/dL (ref 30.0–36.0)
MCV: 95.3 fL (ref 80.0–100.0)
Platelets: 174 10*3/uL (ref 150–400)
RBC: 3.44 MIL/uL — ABNORMAL LOW (ref 3.87–5.11)
RDW: 13.9 % (ref 11.5–15.5)
WBC: 11.5 10*3/uL — ABNORMAL HIGH (ref 4.0–10.5)
nRBC: 0 % (ref 0.0–0.2)

## 2021-06-18 MED ORDER — LACOSAMIDE 50 MG PO TABS
300.0000 mg | ORAL_TABLET | Freq: Two times a day (BID) | ORAL | Status: DC
  Administered 2021-06-18 – 2021-06-19 (×2): 300 mg via ORAL
  Filled 2021-06-18 (×2): qty 2

## 2021-06-18 NOTE — Progress Notes (Signed)
Physical Therapy Treatment ?Patient Details ?Name: Courtney Mcconnell ?MRN: [id number] ?DOB: 29-Nov-1955 ?Today's Date: 06/18/2021 ? ? ?History of Present Illness Pt adm 5/5 for lt TKR. PMH - Rt TKR, seizure, OA, RA, NSTEMI, sleep apnea ? ?  ?PT Comments  ? ? Pt continues to report increased pain and stiffness, stating "no one understands what my body feels like after having those seizures." Declining ambulation but agreeable to transfer to [facility]. She required min assist bed mobility, mod assist sit to stand, and min assist SPT. RW used for transfer. Pt supine in bed at end of session. Ice machine applied L knee. ?   ?Recommendations for follow up therapy are one component of a multi-disciplinary discharge planning process, led by the attending physician.  Recommendations may be updated based on patient status, additional functional criteria and insurance authorization. ? ?Follow Up Recommendations ? Follow physician's recommendations for discharge plan and follow up therapies ?  ?  ?Assistance Recommended at Discharge Intermittent Supervision/Assistance  ?Patient can return home with the following A little help with walking and/or transfers;Help with stairs or ramp for entrance;A little help with bathing/dressing/bathroom;Assist for transportation ?  ?Equipment Recommendations ? None recommended by PT  ?  ?Recommendations for Other Services   ? ? ?  ?Precautions / Restrictions Precautions ?Precautions: Fall;Knee;Other (comment) ?Precaution Comments: seizure ?Required Braces or Orthoses: Knee Immobilizer - Left ?Knee Immobilizer - Left: On except when in CPM  ?  ? ?Mobility ? Bed Mobility ?Overal bed mobility: Needs Assistance ?Bed Mobility: Supine to Sit, Sit to Supine ?  ?  ?Supine to sit: Min assist, HOB elevated ?Sit to supine: Min assist ?  ?General bed mobility comments: increased time, +rail, assist with LLE in/out of bed ?  ? ?Transfers ?Overall transfer level: Needs assistance ?Equipment used: Rolling walker (2  wheels) ?Transfers: Sit to/from Stand, Bed to chair/wheelchair/BSC ?Sit to Stand: Mod assist ?  ?Step pivot transfers: Min assist ?  ?  ?  ?General transfer comment: increased time to power up and stabilize balance ?  ? ?Ambulation/Gait ?  ?  ?  ?  ?  ?  ?  ?  ? ? ?Stairs ?  ?  ?  ?  ?  ? ? ?Wheelchair Mobility ?  ? ?Modified Rankin (Stroke Patients Only) ?  ? ? ?  ?Balance Overall balance assessment: Needs assistance ?Sitting-balance support: No upper extremity supported, Feet supported ?Sitting balance-Leahy Scale: Fair ?  ?  ?Standing balance support: Bilateral upper extremity supported, During functional activity, Reliant on assistive device for balance ?Standing balance-Leahy Scale: Poor ?  ?  ?  ?  ?  ?  ?  ?  ?  ?  ?  ?  ?  ? ?  ?Cognition Arousal/Alertness: Awake/alert ?Behavior During Therapy: [facility] for tasks assessed/performed ?Overall Cognitive Status: Within Functional Limits for tasks assessed ?  ?  ?  ?  ?  ?  ?  ?  ?  ?  ?  ?  ?  ?  ?  ?  ?  ?  ?  ? ?  ?Exercises Total Joint Exercises ?Ankle Circles/Pumps: AROM, Both, 10 reps, Supine ?Quad Sets: AROM, Left, 10 reps, Supine ?Heel Slides: AAROM, Left, 10 reps, Supine ?Hip ABduction/ADduction: AAROM, Left, 10 reps, Supine ?Goniometric ROM: 10-45 ? ?  ?General Comments General comments (skin integrity, edema, etc.): Pt reporting feeling stiff and painful all over following seizures. ?  ?  ? ?Pertinent Vitals/Pain Pain Assessment ?Pain Assessment: 0-10 ?Pain  Score: 9  ?Pain Location: L knee ?Pain Descriptors / Indicators: Grimacing, Guarding, Moaning ?Pain Intervention(s): Limited activity within patient's tolerance, Monitored during session, Repositioned, Ice applied  ? ? ?Home Living   ?  ?  ?  ?  ?  ?  ?  ?  ?  ?   ?  ?Prior Function    ?  ?  ?   ? ?PT Goals (current goals can now be found in the care plan section) Acute Rehab PT Goals ?Patient Stated Goal: home ?Progress towards PT goals: Progressing toward goals ? ?  ?Frequency ? ? ? 7X/week ? ? ? ?   ?PT Plan Current plan remains appropriate  ? ? ?Co-evaluation   ?  ?  ?  ?  ? ?  ?AM-PAC PT "6 Clicks" Mobility   ?Outcome Measure ? Help needed turning from your back to your side while in a flat bed without using bedrails?: None ?Help needed moving from lying on your back to sitting on the side of a flat bed without using bedrails?: A Little ?Help needed moving to and from a bed to a chair (including a wheelchair)?: A Little ?Help needed standing up from a chair using your arms (e.g., wheelchair or bedside chair)?: A Lot ?Help needed to walk in hospital room?: A Little ?Help needed climbing 3-5 steps with a railing? : Total ?6 Click Score: 16 ? ?  ?End of Session Equipment Utilized During Treatment: Gait belt ?Activity Tolerance: Patient limited by pain;Patient limited by fatigue ?Patient left: in bed;with call bell/phone within reach;with family/visitor present ?Nurse Communication: Mobility status ?PT Visit Diagnosis: Other abnormalities of gait and mobility (R26.89);Pain ?Pain - Right/Left: Left ?Pain - part of body: Knee ?  ? ? ?Time: 6073-7106 ?PT Time Calculation (min) (ACUTE ONLY): 20 min ? ?Charges:  $Therapeutic Exercise: 8-22 mins ?$Therapeutic Activity: 8-22 mins          ?          ? ?Lorrin Goodell, PT  ?Office # [phone] ?Pager [phone] ? ? ? ?[name] ?06/18/2021, 1:24 PM ? ?

## 2021-06-18 NOTE — Plan of Care (Signed)
  Problem: Education: Goal: Knowledge of General Education information will improve Description Including pain rating scale, medication(s)/side effects and non-pharmacologic comfort measures Outcome: Progressing   Problem: Health Behavior/Discharge Planning: Goal: Ability to manage health-related needs will improve Outcome: Progressing   

## 2021-06-18 NOTE — Plan of Care (Signed)

## 2021-06-18 NOTE — Progress Notes (Signed)
? ? ? ?  Subjective: ?2 Days Post-Op Procedure(s) (LRB): ?LEFT TOTAL KNEE ARTHROPLASTY (Left) ?3 epiliptic gran mal seizures yesterday with post ictal lethargy last night when seen. ?[facility] saw and felt that there was very little insighting cause in light of just  ?One dose of antiseizure medication being missed day of surgery and having received her usual meds yesterday daily and low use of narcotics.  ? ? ?Patient reports pain as moderate.   ? ?Objective:  ? ?VITALS:  Temp:  [98.3 ?F (36.8 ?C)-98.9 ?F (37.2 ?C)] 98.3 ?F (36.8 ?C) (05/07 4967) ?Pulse Rate:  [75-83] 77 (05/07 0723) ?Resp:  [18] 18 (05/06 1955) ?BP: (102-134)/(58-98) 126/98 (05/07 0723) ?SpO2:  [99 %-100 %] 100 % (05/07 0723) ? ?Neurologically intact ?ABD soft ?Neurovascular intact ?Sensation intact distally ?Intact pulses distally ?Dorsiflexion/Plantar flexion intact ?Incision: dressing C/D/I ? ? ?LABS ?Recent Labs  ?  06/17/21 ?0229 06/18/21 ?0208  ?HGB 10.7* 10.6*  ?WBC 8.8 11.5*  ?PLT 192 174  ? ?Recent Labs  ?  06/17/21 ?0229  ?NA 137  ?K 3.6  ?CL 107  ?CO2 24  ?BUN 8  ?CREATININE 0.93  ?GLUCOSE 160*  ? ?No results for input(s): LABPT, INR in the last 72 hours. ? ? ?Assessment/Plan: ?2 Days Post-Op Procedure(s) (LRB): ?LEFT TOTAL KNEE ARTHROPLASTY (Left) ? ?Advance diet ?Up with therapy ?She is much more alert this morning and seems to be her self again. ?EEG was done with no acute changes noted.  ?Neurology to assist [place] with her seizures and their treatment, will need to keep  ?Until she is stable concerning this and her capacity to ambulate. ? ?Basil Dess ?06/18/2021, 1:01 PM Patient ID: Courtney Mcconnell, female   DOB: [birth year], 66 y.o.   MRN: [id number] ? ?

## 2021-06-18 NOTE — Progress Notes (Signed)
Subjective: ?Courtney Mcconnell is back to baseline.  I discussed with her, she is apparently fairly well controlled from a seizure perspective as an outpatient and has been on this dose regimen for a long time.  She took her dose yesterday morning around 3:30 AM, and then had a seizure around 9 PM. ? ?Exam: ?Vitals:  ? 06/17/21 1955 06/18/21 0723  ?BP: 134/68 (!) 126/98  ?Pulse: 83 77  ?Resp: 18   ?Temp: 98.3 ?F (36.8 ?C) 98.3 ?F (36.8 ?C)  ?SpO2: 100% 100%  ? ?Gen: In bed, NAD ?Resp: non-labored breathing, no acute distress ?Abd: soft, nt ? ?Neuro: ?MS: Awake, alert, oriented ?CN: EOMI, face symmetric ?Motor: No drift in upper extremities, able to wiggle toes bilaterally ?Sensory: Intact to light touch ? ?Impression: 66 year old female with breakthrough seizure in the setting of long duration between doses.  Given that she was so well controlled previously, I would not make any changes to her antiepileptic regimen at this time.  I have changed her timing to better reflect how she takes them at home. ? ?No further recommendations at this time, please call with further questions or concerns. ? ?Recommendations: ?1) changed Vimpat to oral tablets 300 mg at 6 AM and 6 PM ?2) continue zonisamide 200 mg at bedtime ?3) please call with further questions or concerns. ? ?Roland Rack, MD ?[facility] ?[phone] ? ?If 7pm- 7am, please page neurology on call as listed in [place]. ? ?

## 2021-06-18 NOTE — Procedures (Signed)
Patient Name: Sharmin Foulk Massmann  ?MRN: [id number]  ?Epilepsy Attending: Lora Havens  ?Referring Physician/Provider: Kerney Elbe, MD ?Date: 06/18/2021 ?Duration: 22.27 mins ? ?Patient history: 66 year old female with a history of epilepsy, on Vimpat and Zonegran, admitted to Orthopaedics yesterday for left TKA, which she underwent on the same day. This evening, the patient experienced 3 back to back breakthrough seizures that resolved with 0.5 mg IV Ativan. EEG to evaluate for seizure. ? ?Level of alertness: Awake ? ?AEDs during EEG study: ZNS ? ?Technical aspects: This EEG study was done with scalp electrodes positioned according to the 10-20 International system of electrode placement. Electrical activity was acquired at a sampling rate of '500Hz'$  and reviewed with a high frequency filter of '70Hz'$  and a low frequency filter of '1Hz'$ . EEG data were recorded continuously and digitally stored.  ? ?Description: The posterior dominant rhythm consists of 8-9 Hz activity of moderate voltage (25-35 uV) seen predominantly in posterior head regions, symmetric and reactive to eye opening and eye closing. EEG showed intermittent generalized 3 to 6 Hz theta-delta slowing. Hyperventilation and photic stimulation were not performed.    ? ?ABNORMALITY ?- Intermittent slow, generalized ? ?IMPRESSION: ?This study is suggestive of mild diffuse encephalopathy, nonspecific etiology. No seizures or epileptiform discharges were seen throughout the recording. ? ?Lora Havens  ? ?

## 2021-06-18 NOTE — Progress Notes (Signed)
PT Cancellation Note ? ?Patient Details ?Name: Courtney Mcconnell ?MRN: [id number] ?DOB: 1955/07/20 ? ? ?Cancelled Treatment:    Reason Eval/Treat Not Completed: Patient at procedure or test/unavailable. Pt with seizures overnight. Currently being setup for EEG. PT to re-attempt. ? ? ?[name] ?06/18/2021, 8:27 AM ? ?Lorrin Goodell, PT  ?Office # [phone] ?Pager [phone] ? ?

## 2021-06-18 NOTE — Progress Notes (Signed)
This patient had 3 seizures back to back tonight. Rapid Response was called after the first seizure and Dr. Basil Dess was notified immediately. Dr. Louanne Skye came to the patient's bedside and consulted the Hospitalist and Neurology. Both the Hopitalist and Neuro were present at the patient's bedside. Patient's vital signs remained stable. Patient's son at bedside ? ?Post-evaluation: Patient became agitated and restless and stated pulling at cords and trying to get out of bed. The Hospitalist was informed and Ativan 0.'5mg'$  (one dose) was ordered and given. ? ?Patient is now resting and being monitored by this nurse and staff. Patient's son is still at bedside and given support. ?

## 2021-06-18 NOTE — Progress Notes (Signed)
EEG complete - results pending 

## 2021-06-18 NOTE — Progress Notes (Signed)
Physical Therapy Treatment ?Patient Details ?Name: Courtney Mcconnell ?MRN: [id number] ?DOB: [birth year] ?Today's Date: 06/18/2021 ? ? ?History of Present Illness Pt adm 5/5 for lt TKR. PMH - Rt TKR, seizure, OA, RA, NSTEMI, sleep apnea ? ?  ?PT Comments  ? ? Pt with witnessed seizures overnight. Underwent EEG this AM. Pt reports not feeling well, stating "my whole body is sore." Declining mobility but agreeable to exercises. Pt performed LE exercises supine. Placed in CPM at end of session. ?   ?Recommendations for follow up therapy are one component of a multi-disciplinary discharge planning process, led by the attending physician.  Recommendations may be updated based on patient status, additional functional criteria and insurance authorization. ? ?Follow Up Recommendations ? Follow physician's recommendations for discharge plan and follow up therapies ?  ?  ?Assistance Recommended at Discharge Intermittent Supervision/Assistance  ?Patient can return home with the following A little help with walking and/or transfers;Help with stairs or ramp for entrance;A little help with bathing/dressing/bathroom;Assist for transportation ?  ?Equipment Recommendations ? None recommended by PT  ?  ?Recommendations for Other Services   ? ? ?  ?Precautions / Restrictions Precautions ?Precautions: Fall;Knee;Other (comment) ?Precaution Comments: seizure ?Required Braces or Orthoses: Knee Immobilizer - Left ?Knee Immobilizer - Left: On except when in CPM  ?  ? ?Mobility ? Bed Mobility ?  ?  ?  ?  ?  ?  ?  ?  ?  ? ?Transfers ?  ?  ?  ?  ?  ?  ?  ?  ?  ?  ?  ? ?Ambulation/Gait ?  ?  ?  ?  ?  ?  ?  ?  ? ? ?Stairs ?  ?  ?  ?  ?  ? ? ?Wheelchair Mobility ?  ? ?Modified Rankin (Stroke Patients Only) ?  ? ? ?  ?Balance   ?  ?  ?  ?  ?  ?  ?  ?  ?  ?  ?  ?  ?  ?  ?  ?  ?  ?  ?  ? ?  ?Cognition Arousal/Alertness: Awake/alert ?Behavior During Therapy: [facility] for tasks assessed/performed ?Overall Cognitive Status: Within Functional Limits for tasks  assessed ?  ?  ?  ?  ?  ?  ?  ?  ?  ?  ?  ?  ?  ?  ?  ?  ?  ?  ?  ? ?  ?Exercises Total Joint Exercises ?Ankle Circles/Pumps: AROM, Both, 10 reps, Supine ?Quad Sets: AROM, Left, 10 reps, Supine ?Heel Slides: AAROM, Left, 10 reps, Supine ?Hip ABduction/ADduction: AAROM, Left, 10 reps, Supine ?Goniometric ROM: 10-45 ? ?  ?General Comments   ?  ?  ? ?Pertinent Vitals/Pain Pain Assessment ?Pain Assessment: 0-10 ?Pain Score: 9  ?Pain Location: L knee ?Pain Descriptors / Indicators: Grimacing, Guarding, Moaning ?Pain Intervention(s): Limited activity within patient's tolerance, Monitored during session, Repositioned  ? ? ?Home Living   ?  ?  ?  ?  ?  ?  ?  ?  ?  ?   ?  ?Prior Function    ?  ?  ?   ? ?PT Goals (current goals can now be found in the care plan section) Acute Rehab PT Goals ?Patient Stated Goal: home ?Progress towards PT goals: Not progressing toward goals - comment (seizures overnight) ? ?  ?Frequency ? ? ? 7X/week ? ? ? ?  ?PT Plan Current plan remains  appropriate  ? ? ?Co-evaluation   ?  ?  ?  ?  ? ?  ?AM-PAC PT "6 Clicks" Mobility   ?Outcome Measure ? Help needed turning from your back to your side while in a flat bed without using bedrails?: None ?Help needed moving from lying on your back to sitting on the side of a flat bed without using bedrails?: A Little ?Help needed moving to and from a bed to a chair (including a wheelchair)?: A Little ?Help needed standing up from a chair using your arms (e.g., wheelchair or bedside chair)?: A Little ?Help needed to walk in hospital room?: A Little ?Help needed climbing 3-5 steps with a railing? : Total ?6 Click Score: 17 ? ?  ?End of Session   ?Activity Tolerance: Patient limited by pain;Patient limited by fatigue ?Patient left: in bed;in CPM;with call bell/phone within reach;with family/visitor present ?Nurse Communication: Mobility status ?PT Visit Diagnosis: Other abnormalities of gait and mobility (R26.89);Pain ?Pain - Right/Left: Left ?Pain - part of body:  Knee ?  ? ? ?Time: 0942-1000 ?PT Time Calculation (min) (ACUTE ONLY): 18 min ? ?Charges:  $Therapeutic Exercise: 8-22 mins          ?          ? ?Lorrin Goodell, PT  ?Office # [phone] ?Pager [phone] ? ? ? ?[name] ?06/18/2021, 10:26 AM ? ?

## 2021-06-19 ENCOUNTER — Encounter (HOSPITAL_COMMUNITY): Payer: Self-pay | Admitting: Orthopaedic Surgery

## 2021-06-19 NOTE — Progress Notes (Addendum)
Patient ID: Courtney Mcconnell, female   DOB: 1955-05-05, 66 y.o.   MRN: [id number] ? ? ?Subjective: ?3 Days Post-Op Procedure(s) (LRB): ?LEFT TOTAL KNEE ARTHROPLASTY (Left) ?Patient reports pain as mild and moderate.   ? ?Objective: ?Vital signs in last 24 hours: ?Temp:  [97.8 ?F (36.6 ?C)-98.8 ?F (37.1 ?C)] 98.3 ?F (36.8 ?C) (05/08 0734) ?Pulse Rate:  [68-80] 70 (05/08 0734) ?Resp:  [15-20] 15 (05/08 0734) ?BP: (115-137)/(54-67) 115/62 (05/08 0734) ?SpO2:  [97 %-100 %] 100 % (05/08 0858) ? ?Intake/Output from previous day: ?05/07 0701 - 05/08 0700 ?In: 240 [P.O.:240] ?Out: 2500 [Urine:2500] ?Intake/Output this shift: ?No intake/output data recorded. ? ?Recent Labs  ?  06/17/21 ?0229 06/18/21 ?0208  ?HGB 10.7* 10.6*  ? ?Recent Labs  ?  06/17/21 ?0229 06/18/21 ?0208  ?WBC 8.8 11.5*  ?RBC 3.39* 3.44*  ?HCT 31.8* 32.8*  ?PLT 192 174  ? ?Recent Labs  ?  06/17/21 ?0229  ?NA 137  ?K 3.6  ?CL 107  ?CO2 24  ?BUN 8  ?CREATININE 0.93  ?GLUCOSE 160*  ?CALCIUM 8.4*  ? ?No results for input(s): LABPT, INR in the last 72 hours. ? ?Neurologically intact ?EEG adult ? ?Result Date: 06/18/2021 ?Lora Havens, MD     06/18/2021 10:42 AM Patient Name: Courtney Mcconnell MRN: [id number] Epilepsy Attending: Lora Havens Referring Physician/Provider: Kerney Elbe, MD Date: 06/18/2021 Duration: 22.27 mins Patient history: 66 year old female with a history of epilepsy, on Vimpat and Zonegran, admitted to Orthopaedics yesterday for left TKA, which she underwent on the same day. This evening, the patient experienced 3 back to back breakthrough seizures that resolved with 0.5 mg IV Ativan. EEG to evaluate for seizure. Level of alertness: Awake AEDs during EEG study: ZNS Technical aspects: This EEG study was done with scalp electrodes positioned according to the 10-20 International system of electrode placement. Electrical activity was acquired at a sampling rate of '500Hz'$  and reviewed with a high frequency filter of '70Hz'$  and a low frequency filter of  '1Hz'$ . EEG data were recorded continuously and digitally stored. Description: The posterior dominant rhythm consists of 8-9 Hz activity of moderate voltage (25-35 uV) seen predominantly in posterior head regions, symmetric and reactive to eye opening and eye closing. EEG showed intermittent generalized 3 to 6 Hz theta-delta slowing. Hyperventilation and photic stimulation were not performed.   ABNORMALITY - Intermittent slow, generalized IMPRESSION: This study is suggestive of mild diffuse encephalopathy, nonspecific etiology. No seizures or epileptiform discharges were seen throughout the recording. Priyanka Barbra Sarks   ? ?Assessment/Plan: ?3 Days Post-Op Procedure(s) (LRB): ?LEFT TOTAL KNEE ARTHROPLASTY (Left) ?Plan:    epilepsy and EEG above reviewed.  Doing stairs this afternoon. No change in medication form admission with addition of percocet for pain, robaxin and Xarelto since she has Hx of DVT.  Dressing changed office one week. Discharge after she does stairs with PT today.  ? ?[name] ?06/19/2021, 10:06 AM ? ? ? ? ? ?

## 2021-06-19 NOTE — TOC Transition Note (Signed)
Transition of Care (TOC) - CM/SW Discharge Note ? ? ?Patient Details  ?Name: Courtney Mcconnell ?MRN: [id number] ?Date of Birth: 05-01-1955 ? ?Transition of Care (TOC) CM/SW Contact:  ?Bartholomew Crews, RN ?Phone Number: [phone] ?06/19/2021, 10:39 AM ? ? ?Clinical Narrative:    ? ?[facility] to transition home today. [facility] notified. No further TOC needs identified at this time.  ? ?Final next level of care: [place] ?Barriers to Discharge: No Barriers Identified ? ? ?Patient Goals and CMS Choice ?  ?  ?  ? ?Discharge Placement ?  ?           ?  ?  ?  ?  ? ?Discharge Plan and Services ?  ?  ?           ?DME Arranged:  (Daughter reports she has a RW and 3in1/BSC) ?  ?  ?  ?  ?HH Arranged: PT ?[place] Agency: [place] ?Date HH Agency Contacted: 06/19/21 ?Time [place]: 3295 ?Representative spoke with at [place]: Marjory Lies ? ?Social Determinants of Health (SDOH) Interventions ?  ? ? ?Readmission Risk Interventions ?   ? View : No data to display.  ?  ?  ?  ? ? ? ? ? ?

## 2021-06-19 NOTE — Progress Notes (Signed)
Physical Therapy Treatment ?Patient Details ?Name: Courtney Mcconnell ?MRN: [id number] ?DOB: 1955/06/02 ?Today's Date: 06/19/2021 ? ? ?History of Present Illness Pt adm 5/5 for lt TKR. PMH - Rt TKR, seizure, OA, RA, NSTEMI, sleep apnea ? ?  ?PT Comments  ? ? Stair training complete. Pt required min assist ascend/descend 5 steps with 2 rails. Pt's son present. Pt and son verbalize understanding. All questions answered. Pt returned to room in recliner.  ?   ?Recommendations for follow up therapy are one component of a multi-disciplinary discharge planning process, led by the attending physician.  Recommendations may be updated based on patient status, additional functional criteria and insurance authorization. ? ?Follow Up Recommendations ? Follow physician's recommendations for discharge plan and follow up therapies ?  ?  ?Assistance Recommended at Discharge Intermittent Supervision/Assistance  ?Patient can return home with the following A little help with walking and/or transfers;Help with stairs or ramp for entrance;A little help with bathing/dressing/bathroom;Assist for transportation ?  ?Equipment Recommendations ? None recommended by PT  ?  ?Recommendations for Other Services   ? ? ?  ?Precautions / Restrictions Precautions ?Precautions: Fall;Knee;Other (comment) ?Precaution Comments: seizure ?Required Braces or Orthoses: Knee Immobilizer - Left ?Knee Immobilizer - Left: On except when in CPM  ?  ? ?Mobility ? Bed Mobility ?Overal bed mobility: Needs Assistance ?Bed Mobility: Supine to Sit ?  ?  ?Supine to sit: Supervision, HOB elevated ?  ?  ?General bed mobility comments: Pt in recliner. ?  ? ?Transfers ?Overall transfer level: Needs assistance ?Equipment used: Rolling walker (2 wheels) ?Transfers: Sit to/from Stand ?Sit to Stand: Min assist ?  ?Step pivot transfers: Min assist ?  ?  ?  ?General transfer comment: assist to power up, increased time ?  ? ?Ambulation/Gait ?Ambulation/Gait assistance: Min guard ?Gait  Distance (Feet): 150 Feet ?Assistive device: Rolling walker (2 wheels) ?Gait Pattern/deviations: Step-to pattern, Step-through pattern, Decreased stride length, Decreased weight shift to left, Antalgic ?Gait velocity: decreased ?Gait velocity interpretation: <1.31 ft/sec, indicative of household ambulator ?  ?General Gait Details: Good gait progression. Min guard assist for safety. Cues for posture. ? ? ?Stairs ?Stairs: Yes ?Stairs assistance: Min assist ?Stair Management: Two rails, Step to pattern, Forwards ?Number of Stairs: 5 ?General stair comments: Pt to have +2 assist, one person on each side, to ascend 3 steps in house. Educated on technique for 2-person assist in place of rails. Pt also educated on ascend/descend steps with RW verbally and through demonstration. Handout provided. Son present for stair training session. ? ? ?Wheelchair Mobility ?  ? ?Modified Rankin (Stroke Patients Only) ?  ? ? ?  ?Balance Overall balance assessment: Needs assistance ?Sitting-balance support: No upper extremity supported, Feet supported ?Sitting balance-Leahy Scale: Good ?  ?  ?Standing balance support: Bilateral upper extremity supported, During functional activity, Reliant on assistive device for balance ?Standing balance-Leahy Scale: Poor ?  ?  ?  ?  ?  ?  ?  ?  ?  ?  ?  ?  ?  ? ?  ?Cognition Arousal/Alertness: Awake/alert ?Behavior During Therapy: [facility] for tasks assessed/performed ?Overall Cognitive Status: Within Functional Limits for tasks assessed ?  ?  ?  ?  ?  ?  ?  ?  ?  ?  ?  ?  ?  ?  ?  ?  ?  ?  ?  ? ?  ?Exercises   ? ?  ?General Comments General comments (skin integrity, edema, etc.): Resting HR in 80s.  HR into 100s with mobility. SpO2 stable on RA. ?  ?  ? ?Pertinent Vitals/Pain Pain Assessment ?Pain Assessment: Faces ?Pain Score: 6  ?Faces Pain Scale: Hurts little more ?Pain Location: L knee ?Pain Descriptors / Indicators: Grimacing, Guarding, Discomfort ?Pain Intervention(s): Monitored during session   ? ? ?Home Living   ?  ?  ?  ?  ?  ?  ?  ?  ?  ?   ?  ?Prior Function    ?  ?  ?   ? ?PT Goals (current goals can now be found in the care plan section) Acute Rehab PT Goals ?Patient Stated Goal: home ?Progress towards PT goals: Progressing toward goals ? ?  ?Frequency ? ? ? 7X/week ? ? ? ?  ?PT Plan Current plan remains appropriate  ? ? ?Co-evaluation   ?  ?  ?  ?  ? ?  ?AM-PAC PT "6 Clicks" Mobility   ?Outcome Measure ? Help needed turning from your back to your side while in a flat bed without using bedrails?: None ?Help needed moving from lying on your back to sitting on the side of a flat bed without using bedrails?: A Little ?Help needed moving to and from a bed to a chair (including a wheelchair)?: A Little ?Help needed standing up from a chair using your arms (e.g., wheelchair or bedside chair)?: A Little ?Help needed to walk in hospital room?: A Little ?Help needed climbing 3-5 steps with a railing? : A Lot ?6 Click Score: 18 ? ?  ?End of Session Equipment Utilized During Treatment: Gait belt;Left knee immobilizer ?Activity Tolerance: Patient tolerated treatment well ?Patient left: in chair;with call bell/phone within reach;with family/visitor present ?Nurse Communication: Mobility status ?PT Visit Diagnosis: Other abnormalities of gait and mobility (R26.89);Pain ?Pain - Right/Left: Left ?Pain - part of body: Knee ?  ? ? ?Time: 3734-2876 ?PT Time Calculation (min) (ACUTE ONLY): 15 min ? ?Charges:  $Gait Training: 8-22 mins          ?          ? ?Lorrin Goodell, PT  ?Office # [phone] ?Pager [phone] ? ? ? ?[name] ?06/19/2021, 11:06 AM ? ?

## 2021-06-19 NOTE — Progress Notes (Signed)
Physical Therapy Treatment ?Patient Details ?Name: Courtney Mcconnell ?MRN: [id number] ?DOB: [birth year] ?Today's Date: 06/19/2021 ? ? ?History of Present Illness Pt adm 5/5 for lt TKR. PMH - Rt TKR, seizure, OA, RA, NSTEMI, sleep apnea ? ?  ?PT Comments  ? ? Pt received in bed, in good spirits and eager to participate in therapy. Pt assisted to [facility] prior to hallway ambulation. She required supervision bed mobility, min assist transfers, and min guard assist ambulation 150' with RW. KI in place for entirety of session. Verbally educated on stairs. Pt and son report stairs are wide enough for pt to ascend with one person on each side. Gait belt provided for home. Pt in recliner with feet elevated at end of session.  ?   ?Recommendations for follow up therapy are one component of a multi-disciplinary discharge planning process, led by the attending physician.  Recommendations may be updated based on patient status, additional functional criteria and insurance authorization. ? ?Follow Up Recommendations ? Follow physician's recommendations for discharge plan and follow up therapies ?  ?  ?Assistance Recommended at Discharge Intermittent Supervision/Assistance  ?Patient can return home with the following A little help with walking and/or transfers;Help with stairs or ramp for entrance;A little help with bathing/dressing/bathroom;Assist for transportation ?  ?Equipment Recommendations ? None recommended by PT  ?  ?Recommendations for Other Services   ? ? ?  ?Precautions / Restrictions Precautions ?Precautions: Fall;Knee;Other (comment) ?Precaution Comments: seizure ?Required Braces or Orthoses: Knee Immobilizer - Left ?Knee Immobilizer - Left: On except when in CPM  ?  ? ?Mobility ? Bed Mobility ?Overal bed mobility: Needs Assistance ?Bed Mobility: Supine to Sit ?  ?  ?Supine to sit: Supervision, HOB elevated ?  ?  ?General bed mobility comments: +rail ?  ? ?Transfers ?Overall transfer level: Needs assistance ?Equipment used:  Rolling walker (2 wheels) ?Transfers: Sit to/from Stand, Bed to chair/wheelchair/BSC ?Sit to Stand: Min assist ?  ?Step pivot transfers: Min assist ?  ?  ?  ?General transfer comment: assist to power up, increased time ?  ? ?Ambulation/Gait ?Ambulation/Gait assistance: Min guard ?Gait Distance (Feet): 150 Feet ?Assistive device: Rolling walker (2 wheels) ?Gait Pattern/deviations: Step-to pattern, Step-through pattern, Decreased stride length, Decreased weight shift to left, Antalgic ?Gait velocity: decreased ?Gait velocity interpretation: <1.31 ft/sec, indicative of household ambulator ?  ?General Gait Details: Good gait progression. Min guard assist for safety. Cues for posture. ? ? ?Stairs ?Stairs:  (verbally educated on stairs. Pt and son report stairs are wide enough for pt to to up with one person on each side.) ?  ?  ?  ?  ? ? ?Wheelchair Mobility ?  ? ?Modified Rankin (Stroke Patients Only) ?  ? ? ?  ?Balance Overall balance assessment: Needs assistance ?Sitting-balance support: No upper extremity supported, Feet supported ?Sitting balance-Leahy Scale: Good ?  ?  ?Standing balance support: Bilateral upper extremity supported, During functional activity, Reliant on assistive device for balance ?Standing balance-Leahy Scale: Poor ?  ?  ?  ?  ?  ?  ?  ?  ?  ?  ?  ?  ?  ? ?  ?Cognition Arousal/Alertness: Awake/alert ?Behavior During Therapy: [facility] for tasks assessed/performed ?Overall Cognitive Status: Within Functional Limits for tasks assessed ?  ?  ?  ?  ?  ?  ?  ?  ?  ?  ?  ?  ?  ?  ?  ?  ?  ?  ?  ? ?  ?  Exercises   ? ?  ?General Comments General comments (skin integrity, edema, etc.): Resting HR in 80s. HR into 100s with mobility. SpO2 stable on RA. ?  ?  ? ?Pertinent Vitals/Pain Pain Assessment ?Pain Assessment: 0-10 ?Pain Score: 6  ?Pain Location: L knee ?Pain Descriptors / Indicators: Grimacing, Guarding, Discomfort ?Pain Intervention(s): Monitored during session, Repositioned  ? ? ?Home Living   ?  ?  ?  ?   ?  ?  ?  ?  ?  ?   ?  ?Prior Function    ?  ?  ?   ? ?PT Goals (current goals can now be found in the care plan section) Acute Rehab PT Goals ?Patient Stated Goal: home ?Progress towards PT goals: Progressing toward goals ? ?  ?Frequency ? ? ? 7X/week ? ? ? ?  ?PT Plan Current plan remains appropriate  ? ? ?Co-evaluation   ?  ?  ?  ?  ? ?  ?AM-PAC PT "6 Clicks" Mobility   ?Outcome Measure ? Help needed turning from your back to your side while in a flat bed without using bedrails?: None ?Help needed moving from lying on your back to sitting on the side of a flat bed without using bedrails?: A Little ?Help needed moving to and from a bed to a chair (including a wheelchair)?: A Little ?Help needed standing up from a chair using your arms (e.g., wheelchair or bedside chair)?: A Little ?Help needed to walk in hospital room?: A Little ?Help needed climbing 3-5 steps with a railing? : A Lot ?6 Click Score: 18 ? ?  ?End of Session Equipment Utilized During Treatment: Gait belt;Left knee immobilizer ?Activity Tolerance: Patient tolerated treatment well ?Patient left: in chair;with call bell/phone within reach;with family/visitor present ?Nurse Communication: Mobility status ?PT Visit Diagnosis: Other abnormalities of gait and mobility (R26.89);Pain ?Pain - Right/Left: Left ?Pain - part of body: Knee ?  ? ? ?Time: 1914-7829 ?PT Time Calculation (min) (ACUTE ONLY): 26 min ? ?Charges:  $Gait Training: 23-37 mins          ?          ? ?Courtney Mcconnell, PT  ?Office # [phone] ?Pager [phone] ? ? ? ?Courtney Mcconnell ?06/19/2021, 9:12 AM ? ?

## 2021-06-20 DIAGNOSIS — M0579 Rheumatoid arthritis with rheumatoid factor of multiple sites without organ or systems involvement: Secondary | ICD-10-CM | POA: Diagnosis not present

## 2021-06-20 DIAGNOSIS — Z955 Presence of coronary angioplasty implant and graft: Secondary | ICD-10-CM | POA: Diagnosis not present

## 2021-06-20 DIAGNOSIS — Z9049 Acquired absence of other specified parts of digestive tract: Secondary | ICD-10-CM | POA: Diagnosis not present

## 2021-06-20 DIAGNOSIS — Z7951 Long term (current) use of inhaled steroids: Secondary | ICD-10-CM | POA: Diagnosis not present

## 2021-06-20 DIAGNOSIS — G4730 Sleep apnea, unspecified: Secondary | ICD-10-CM | POA: Diagnosis not present

## 2021-06-20 DIAGNOSIS — I10 Essential (primary) hypertension: Secondary | ICD-10-CM | POA: Diagnosis not present

## 2021-06-20 DIAGNOSIS — E119 Type 2 diabetes mellitus without complications: Secondary | ICD-10-CM | POA: Diagnosis not present

## 2021-06-20 DIAGNOSIS — I2510 Atherosclerotic heart disease of native coronary artery without angina pectoris: Secondary | ICD-10-CM | POA: Diagnosis not present

## 2021-06-20 DIAGNOSIS — E785 Hyperlipidemia, unspecified: Secondary | ICD-10-CM | POA: Diagnosis not present

## 2021-06-20 DIAGNOSIS — I252 Old myocardial infarction: Secondary | ICD-10-CM | POA: Diagnosis not present

## 2021-06-20 DIAGNOSIS — Z86718 Personal history of other venous thrombosis and embolism: Secondary | ICD-10-CM | POA: Diagnosis not present

## 2021-06-20 DIAGNOSIS — J439 Emphysema, unspecified: Secondary | ICD-10-CM | POA: Diagnosis not present

## 2021-06-20 DIAGNOSIS — Z96653 Presence of artificial knee joint, bilateral: Secondary | ICD-10-CM | POA: Diagnosis not present

## 2021-06-20 DIAGNOSIS — Z8673 Personal history of transient ischemic attack (TIA), and cerebral infarction without residual deficits: Secondary | ICD-10-CM | POA: Diagnosis not present

## 2021-06-20 DIAGNOSIS — Z471 Aftercare following joint replacement surgery: Secondary | ICD-10-CM | POA: Diagnosis not present

## 2021-06-20 DIAGNOSIS — Z87891 Personal history of nicotine dependence: Secondary | ICD-10-CM | POA: Diagnosis not present

## 2021-06-20 DIAGNOSIS — Z9181 History of falling: Secondary | ICD-10-CM | POA: Diagnosis not present

## 2021-06-20 DIAGNOSIS — K219 Gastro-esophageal reflux disease without esophagitis: Secondary | ICD-10-CM | POA: Diagnosis not present

## 2021-06-20 DIAGNOSIS — Z7901 Long term (current) use of anticoagulants: Secondary | ICD-10-CM | POA: Diagnosis not present

## 2021-06-21 ENCOUNTER — Telehealth: Payer: Self-pay | Admitting: *Deleted

## 2021-06-21 NOTE — Telephone Encounter (Signed)
Ortho bundle D/C call completed. 

## 2021-06-22 DIAGNOSIS — E785 Hyperlipidemia, unspecified: Secondary | ICD-10-CM | POA: Diagnosis not present

## 2021-06-22 DIAGNOSIS — Z87891 Personal history of nicotine dependence: Secondary | ICD-10-CM | POA: Diagnosis not present

## 2021-06-22 DIAGNOSIS — Z96653 Presence of artificial knee joint, bilateral: Secondary | ICD-10-CM | POA: Diagnosis not present

## 2021-06-22 DIAGNOSIS — Z86718 Personal history of other venous thrombosis and embolism: Secondary | ICD-10-CM | POA: Diagnosis not present

## 2021-06-22 DIAGNOSIS — Z7901 Long term (current) use of anticoagulants: Secondary | ICD-10-CM | POA: Diagnosis not present

## 2021-06-22 DIAGNOSIS — I2510 Atherosclerotic heart disease of native coronary artery without angina pectoris: Secondary | ICD-10-CM | POA: Diagnosis not present

## 2021-06-22 DIAGNOSIS — Z8673 Personal history of transient ischemic attack (TIA), and cerebral infarction without residual deficits: Secondary | ICD-10-CM | POA: Diagnosis not present

## 2021-06-22 DIAGNOSIS — Z471 Aftercare following joint replacement surgery: Secondary | ICD-10-CM | POA: Diagnosis not present

## 2021-06-22 DIAGNOSIS — Z9181 History of falling: Secondary | ICD-10-CM | POA: Diagnosis not present

## 2021-06-22 DIAGNOSIS — I252 Old myocardial infarction: Secondary | ICD-10-CM | POA: Diagnosis not present

## 2021-06-22 DIAGNOSIS — Z9049 Acquired absence of other specified parts of digestive tract: Secondary | ICD-10-CM | POA: Diagnosis not present

## 2021-06-22 DIAGNOSIS — E119 Type 2 diabetes mellitus without complications: Secondary | ICD-10-CM | POA: Diagnosis not present

## 2021-06-22 DIAGNOSIS — Z955 Presence of coronary angioplasty implant and graft: Secondary | ICD-10-CM | POA: Diagnosis not present

## 2021-06-22 DIAGNOSIS — M0579 Rheumatoid arthritis with rheumatoid factor of multiple sites without organ or systems involvement: Secondary | ICD-10-CM | POA: Diagnosis not present

## 2021-06-22 DIAGNOSIS — I10 Essential (primary) hypertension: Secondary | ICD-10-CM | POA: Diagnosis not present

## 2021-06-22 DIAGNOSIS — K219 Gastro-esophageal reflux disease without esophagitis: Secondary | ICD-10-CM | POA: Diagnosis not present

## 2021-06-22 DIAGNOSIS — J439 Emphysema, unspecified: Secondary | ICD-10-CM | POA: Diagnosis not present

## 2021-06-22 DIAGNOSIS — Z7951 Long term (current) use of inhaled steroids: Secondary | ICD-10-CM | POA: Diagnosis not present

## 2021-06-22 DIAGNOSIS — G4730 Sleep apnea, unspecified: Secondary | ICD-10-CM | POA: Diagnosis not present

## 2021-06-24 ENCOUNTER — Other Ambulatory Visit: Payer: Self-pay | Admitting: Family Medicine

## 2021-06-24 DIAGNOSIS — Z8673 Personal history of transient ischemic attack (TIA), and cerebral infarction without residual deficits: Secondary | ICD-10-CM | POA: Diagnosis not present

## 2021-06-24 DIAGNOSIS — E119 Type 2 diabetes mellitus without complications: Secondary | ICD-10-CM | POA: Diagnosis not present

## 2021-06-24 DIAGNOSIS — Z96653 Presence of artificial knee joint, bilateral: Secondary | ICD-10-CM | POA: Diagnosis not present

## 2021-06-24 DIAGNOSIS — I2510 Atherosclerotic heart disease of native coronary artery without angina pectoris: Secondary | ICD-10-CM | POA: Diagnosis not present

## 2021-06-24 DIAGNOSIS — Z87891 Personal history of nicotine dependence: Secondary | ICD-10-CM | POA: Diagnosis not present

## 2021-06-24 DIAGNOSIS — Z7901 Long term (current) use of anticoagulants: Secondary | ICD-10-CM | POA: Diagnosis not present

## 2021-06-24 DIAGNOSIS — I252 Old myocardial infarction: Secondary | ICD-10-CM | POA: Diagnosis not present

## 2021-06-24 DIAGNOSIS — M0579 Rheumatoid arthritis with rheumatoid factor of multiple sites without organ or systems involvement: Secondary | ICD-10-CM | POA: Diagnosis not present

## 2021-06-24 DIAGNOSIS — Z955 Presence of coronary angioplasty implant and graft: Secondary | ICD-10-CM | POA: Diagnosis not present

## 2021-06-24 DIAGNOSIS — K219 Gastro-esophageal reflux disease without esophagitis: Secondary | ICD-10-CM | POA: Diagnosis not present

## 2021-06-24 DIAGNOSIS — Z86718 Personal history of other venous thrombosis and embolism: Secondary | ICD-10-CM | POA: Diagnosis not present

## 2021-06-24 DIAGNOSIS — J439 Emphysema, unspecified: Secondary | ICD-10-CM | POA: Diagnosis not present

## 2021-06-24 DIAGNOSIS — E785 Hyperlipidemia, unspecified: Secondary | ICD-10-CM | POA: Diagnosis not present

## 2021-06-24 DIAGNOSIS — I10 Essential (primary) hypertension: Secondary | ICD-10-CM | POA: Diagnosis not present

## 2021-06-24 DIAGNOSIS — G4730 Sleep apnea, unspecified: Secondary | ICD-10-CM | POA: Diagnosis not present

## 2021-06-24 DIAGNOSIS — Z9049 Acquired absence of other specified parts of digestive tract: Secondary | ICD-10-CM | POA: Diagnosis not present

## 2021-06-24 DIAGNOSIS — Z9181 History of falling: Secondary | ICD-10-CM | POA: Diagnosis not present

## 2021-06-24 DIAGNOSIS — Z7951 Long term (current) use of inhaled steroids: Secondary | ICD-10-CM | POA: Diagnosis not present

## 2021-06-24 DIAGNOSIS — Z471 Aftercare following joint replacement surgery: Secondary | ICD-10-CM | POA: Diagnosis not present

## 2021-06-26 DIAGNOSIS — Z9181 History of falling: Secondary | ICD-10-CM | POA: Diagnosis not present

## 2021-06-26 DIAGNOSIS — I10 Essential (primary) hypertension: Secondary | ICD-10-CM | POA: Diagnosis not present

## 2021-06-26 DIAGNOSIS — Z471 Aftercare following joint replacement surgery: Secondary | ICD-10-CM | POA: Diagnosis not present

## 2021-06-26 DIAGNOSIS — Z96653 Presence of artificial knee joint, bilateral: Secondary | ICD-10-CM | POA: Diagnosis not present

## 2021-06-26 DIAGNOSIS — Z8673 Personal history of transient ischemic attack (TIA), and cerebral infarction without residual deficits: Secondary | ICD-10-CM | POA: Diagnosis not present

## 2021-06-26 DIAGNOSIS — E119 Type 2 diabetes mellitus without complications: Secondary | ICD-10-CM | POA: Diagnosis not present

## 2021-06-26 DIAGNOSIS — Z86718 Personal history of other venous thrombosis and embolism: Secondary | ICD-10-CM | POA: Diagnosis not present

## 2021-06-26 DIAGNOSIS — Z87891 Personal history of nicotine dependence: Secondary | ICD-10-CM | POA: Diagnosis not present

## 2021-06-26 DIAGNOSIS — K219 Gastro-esophageal reflux disease without esophagitis: Secondary | ICD-10-CM | POA: Diagnosis not present

## 2021-06-26 DIAGNOSIS — I252 Old myocardial infarction: Secondary | ICD-10-CM | POA: Diagnosis not present

## 2021-06-26 DIAGNOSIS — Z7951 Long term (current) use of inhaled steroids: Secondary | ICD-10-CM | POA: Diagnosis not present

## 2021-06-26 DIAGNOSIS — E785 Hyperlipidemia, unspecified: Secondary | ICD-10-CM | POA: Diagnosis not present

## 2021-06-26 DIAGNOSIS — J439 Emphysema, unspecified: Secondary | ICD-10-CM | POA: Diagnosis not present

## 2021-06-26 DIAGNOSIS — Z7901 Long term (current) use of anticoagulants: Secondary | ICD-10-CM | POA: Diagnosis not present

## 2021-06-26 DIAGNOSIS — M0579 Rheumatoid arthritis with rheumatoid factor of multiple sites without organ or systems involvement: Secondary | ICD-10-CM | POA: Diagnosis not present

## 2021-06-26 DIAGNOSIS — Z955 Presence of coronary angioplasty implant and graft: Secondary | ICD-10-CM | POA: Diagnosis not present

## 2021-06-26 DIAGNOSIS — G4730 Sleep apnea, unspecified: Secondary | ICD-10-CM | POA: Diagnosis not present

## 2021-06-26 DIAGNOSIS — Z9049 Acquired absence of other specified parts of digestive tract: Secondary | ICD-10-CM | POA: Diagnosis not present

## 2021-06-26 DIAGNOSIS — I2510 Atherosclerotic heart disease of native coronary artery without angina pectoris: Secondary | ICD-10-CM | POA: Diagnosis not present

## 2021-06-28 DIAGNOSIS — I10 Essential (primary) hypertension: Secondary | ICD-10-CM | POA: Diagnosis not present

## 2021-06-28 DIAGNOSIS — Z7951 Long term (current) use of inhaled steroids: Secondary | ICD-10-CM | POA: Diagnosis not present

## 2021-06-28 DIAGNOSIS — Z87891 Personal history of nicotine dependence: Secondary | ICD-10-CM | POA: Diagnosis not present

## 2021-06-28 DIAGNOSIS — Z8673 Personal history of transient ischemic attack (TIA), and cerebral infarction without residual deficits: Secondary | ICD-10-CM | POA: Diagnosis not present

## 2021-06-28 DIAGNOSIS — I252 Old myocardial infarction: Secondary | ICD-10-CM | POA: Diagnosis not present

## 2021-06-28 DIAGNOSIS — M0579 Rheumatoid arthritis with rheumatoid factor of multiple sites without organ or systems involvement: Secondary | ICD-10-CM | POA: Diagnosis not present

## 2021-06-28 DIAGNOSIS — Z96653 Presence of artificial knee joint, bilateral: Secondary | ICD-10-CM | POA: Diagnosis not present

## 2021-06-28 DIAGNOSIS — J439 Emphysema, unspecified: Secondary | ICD-10-CM | POA: Diagnosis not present

## 2021-06-28 DIAGNOSIS — Z86718 Personal history of other venous thrombosis and embolism: Secondary | ICD-10-CM | POA: Diagnosis not present

## 2021-06-28 DIAGNOSIS — I2510 Atherosclerotic heart disease of native coronary artery without angina pectoris: Secondary | ICD-10-CM | POA: Diagnosis not present

## 2021-06-28 DIAGNOSIS — Z471 Aftercare following joint replacement surgery: Secondary | ICD-10-CM | POA: Diagnosis not present

## 2021-06-28 DIAGNOSIS — G4730 Sleep apnea, unspecified: Secondary | ICD-10-CM | POA: Diagnosis not present

## 2021-06-28 DIAGNOSIS — E785 Hyperlipidemia, unspecified: Secondary | ICD-10-CM | POA: Diagnosis not present

## 2021-06-28 DIAGNOSIS — Z9181 History of falling: Secondary | ICD-10-CM | POA: Diagnosis not present

## 2021-06-28 DIAGNOSIS — K219 Gastro-esophageal reflux disease without esophagitis: Secondary | ICD-10-CM | POA: Diagnosis not present

## 2021-06-28 DIAGNOSIS — Z7901 Long term (current) use of anticoagulants: Secondary | ICD-10-CM | POA: Diagnosis not present

## 2021-06-28 DIAGNOSIS — E119 Type 2 diabetes mellitus without complications: Secondary | ICD-10-CM | POA: Diagnosis not present

## 2021-06-28 DIAGNOSIS — Z955 Presence of coronary angioplasty implant and graft: Secondary | ICD-10-CM | POA: Diagnosis not present

## 2021-06-28 DIAGNOSIS — Z9049 Acquired absence of other specified parts of digestive tract: Secondary | ICD-10-CM | POA: Diagnosis not present

## 2021-06-29 ENCOUNTER — Ambulatory Visit (INDEPENDENT_AMBULATORY_CARE_PROVIDER_SITE_OTHER): Payer: Medicare Other | Admitting: Orthopaedic Surgery

## 2021-06-29 ENCOUNTER — Ambulatory Visit (INDEPENDENT_AMBULATORY_CARE_PROVIDER_SITE_OTHER): Payer: Medicare Other

## 2021-06-29 ENCOUNTER — Encounter: Payer: Self-pay | Admitting: Orthopaedic Surgery

## 2021-06-29 VITALS — Ht 66.0 in | Wt 226.0 lb

## 2021-06-29 DIAGNOSIS — Z96652 Presence of left artificial knee joint: Secondary | ICD-10-CM | POA: Diagnosis not present

## 2021-06-29 MED ORDER — OXYCODONE-ACETAMINOPHEN 5-325 MG PO TABS
1.0000 | ORAL_TABLET | Freq: Four times a day (QID) | ORAL | 0 refills | Status: DC | PRN
Start: 2021-06-29 — End: 2021-10-11

## 2021-06-29 NOTE — Progress Notes (Signed)
Post-Op Visit Note   Patient: Courtney Mcconnell           Date of Birth: Dec 15, 1955           MRN: [id number] Visit Date: 06/29/2021 PCP: Dettinger, Fransisca Kaufmann, MD   Assessment & Plan: Post left total knee arthroplasty.  She is ready to start outpatient therapy [facility] for left total knee arthroplasty.  Staples harvested today.  X-rays show good position alignment.  Recheck 5 weeks.  Patient is 20 degree extension lag.  She lacks 10 degrees reaching full extension passively and needs to work on getting full extension.  Previous total knee arthroplasty opposite right knee has good extension.  Flexion is good quad strength and extension are 2 major she will have to work hard on.  Recheck 5 weeks.  Pain medication renewed.  Chief Complaint:  Chief Complaint  Patient presents with   Left Knee - Routine Post Op    06/16/2021 Left TKA   Visit Diagnoses:  1. S/P total knee arthroplasty, left     Plan: ROV 5 wks  Follow-Up Instructions: Return in about 5 weeks (around 08/03/2021).   Orders:  Orders Placed This Encounter  Procedures   XR Knee 1-2 Views Left   Ambulatory referral to Physical Therapy   No orders of the defined types were placed in this encounter.   Imaging: No results found.  PMFS History: Patient Active Problem List   Diagnosis Date Noted   Arthritis of left knee 06/16/2021   Unilateral primary osteoarthritis, left knee 05/11/2021   DVT (deep venous thrombosis) ([place]) 01/19/2021   S/P TKR (total knee replacement), right 01/09/2021   Rheumatoid arthritis with rheumatoid factor of multiple sites without organ or systems involvement ([place]) 12/02/2020   Coronary artery disease involving native coronary artery of native heart with angina pectoris ([place]) 05/22/2019   Hyperlipidemia with target LDL less than 70 -> CAD 02/12/2019   Presence of drug coated stent in LAD coronary artery 02/11/2019   History of non-ST elevation myocardial infarction (NSTEMI) 02/10/2019    Cerebral infarction, remote, resolved 06/27/2018   Pulmonary nodules 12/08/2015   Sleep apnea 04/06/2015   Pulmonary emphysema ([place]) 03/02/2015   Smoker 02/16/2015   Seizure disorder ([place]) 01/21/2015   Focal epilepsy with impairment of consciousness ([place]) 12/13/2014   Essential hypertension 04/18/2014   Preretinal fibrosis, 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   Tobacco use disorder 07/19/2012   Past Medical History:  Diagnosis Date   Asthma    Bursitis    Hip   CAD (coronary artery disease) 02/11/2019   PCI/DES x1 to mLAD, focal ostial 70% lesion in 1st diag   COPD (chronic obstructive pulmonary disease) ([place])    Essential hypertension    GERD (gastroesophageal reflux disease)    Headache(784.0)    Hyperlipidemia    NSTEMI (non-ST elevated myocardial infarction) ([place]) 02/11/2019   Pneumonia    Seizure ([place])    Sleep apnea     Family History  Problem Relation Age of Onset   COPD Mother    Diabetes Father    Hypertension Father    Hyperlipidemia Father    Cancer Sister        Bone Cancer   Diabetes Brother    Cancer Brother    Cancer Paternal Uncle    Stroke Paternal Grandmother    Healthy Daughter    Healthy Son    Colon cancer Neg Hx     Past Surgical History:  Procedure Laterality Date  ABDOMINAL HYSTERECTOMY     APPENDECTOMY     CATARACT EXTRACTION Bilateral    CHOLECYSTECTOMY     CORONARY STENT INTERVENTION N/A 02/11/2019   Procedure: CORONARY STENT INTERVENTION;  Surgeon: Burnell Blanks, MD;  Location: [place] [facility];; mid LAD 70% -- DES PCI (Synergy DES 2.5 x 28 --2.8 mm)   heal spur     right   INTRAVASCULAR PRESSURE WIRE/FFR STUDY N/A 02/11/2019   Procedure: INTRAVASCULAR PRESSURE WIRE/FFR STUDY;  Surgeon: Burnell Blanks, MD;  Location: [place] [facility];  Service: Cardiovascular;  Laterality: N/A;   LASER PHOTO ABLATION Right 08/27/2013   Procedure: LASER PHOTO ABLATION;  Surgeon: Hayden Pedro, MD;  Location: [place];  Service: Ophthalmology;   Laterality: Right;  Headscope laser AND Endolaser   LEFT HEART CATH AND CORONARY ANGIOGRAPHY N/A 02/11/2019   Procedure: LEFT HEART CATH AND CORONARY ANGIOGRAPHY;  Surgeon: Burnell Blanks, MD;  Location: [place] [facility];;; prox RCA 20%. Ost-prox Cx 20%. ostial D1 70% & mLAD 70% => (DES PCI of LAD)   MEMBRANE PEEL Right 08/27/2013   Procedure: MEMBRANE PEEL;  Surgeon: Hayden Pedro, MD;  Location: [place];  Service: Ophthalmology;  Laterality: Right;   PARS PLANA VITRECTOMY Right 08/27/2013   Procedure: PARS PLANA VITRECTOMY WITH 25 GAUGE;  Surgeon: Hayden Pedro, MD;  Location: [place];  Service: Ophthalmology;  Laterality: Right;   TOTAL KNEE ARTHROPLASTY Right 01/09/2021   Procedure: RIGHT TOTAL KNEE ARTHROPLASTY;  Surgeon: [name], MD;  Location: [facility];  Service: Orthopedics;  Laterality: Right;  Needs RNFA   TOTAL KNEE ARTHROPLASTY Left 06/16/2021   Procedure: LEFT TOTAL KNEE ARTHROPLASTY;  Surgeon: [name], MD;  Location: [place];  Service: Orthopedics;  Laterality: Left;   Social History   Occupational History   Occupation: Airline pilot: [employer]  Tobacco Use   Smoking status: Former    Packs/day: 0.25    Years: 42.00    Pack years: 10.50    Types: Cigarettes    Quit date: 03/20/2014    Years since quitting: 7.2   Smokeless tobacco: Never   Tobacco comments:    01/21/15 restarted smoking 4 mos ago  Vaping Use   Vaping Use: Never used  Substance and Sexual Activity   Alcohol use: No    Alcohol/week: 0.0 standard drinks   Drug use: No   Sexual activity: Not on file

## 2021-06-30 ENCOUNTER — Telehealth: Payer: Self-pay | Admitting: Orthopaedic Surgery

## 2021-06-30 DIAGNOSIS — Z7901 Long term (current) use of anticoagulants: Secondary | ICD-10-CM | POA: Diagnosis not present

## 2021-06-30 DIAGNOSIS — I10 Essential (primary) hypertension: Secondary | ICD-10-CM | POA: Diagnosis not present

## 2021-06-30 DIAGNOSIS — Z96653 Presence of artificial knee joint, bilateral: Secondary | ICD-10-CM | POA: Diagnosis not present

## 2021-06-30 DIAGNOSIS — I252 Old myocardial infarction: Secondary | ICD-10-CM | POA: Diagnosis not present

## 2021-06-30 DIAGNOSIS — Z955 Presence of coronary angioplasty implant and graft: Secondary | ICD-10-CM | POA: Diagnosis not present

## 2021-06-30 DIAGNOSIS — M0579 Rheumatoid arthritis with rheumatoid factor of multiple sites without organ or systems involvement: Secondary | ICD-10-CM | POA: Diagnosis not present

## 2021-06-30 DIAGNOSIS — K219 Gastro-esophageal reflux disease without esophagitis: Secondary | ICD-10-CM | POA: Diagnosis not present

## 2021-06-30 DIAGNOSIS — E785 Hyperlipidemia, unspecified: Secondary | ICD-10-CM | POA: Diagnosis not present

## 2021-06-30 DIAGNOSIS — I2510 Atherosclerotic heart disease of native coronary artery without angina pectoris: Secondary | ICD-10-CM | POA: Diagnosis not present

## 2021-06-30 DIAGNOSIS — Z87891 Personal history of nicotine dependence: Secondary | ICD-10-CM | POA: Diagnosis not present

## 2021-06-30 DIAGNOSIS — E119 Type 2 diabetes mellitus without complications: Secondary | ICD-10-CM | POA: Diagnosis not present

## 2021-06-30 DIAGNOSIS — Z7951 Long term (current) use of inhaled steroids: Secondary | ICD-10-CM | POA: Diagnosis not present

## 2021-06-30 DIAGNOSIS — Z9049 Acquired absence of other specified parts of digestive tract: Secondary | ICD-10-CM | POA: Diagnosis not present

## 2021-06-30 DIAGNOSIS — J439 Emphysema, unspecified: Secondary | ICD-10-CM | POA: Diagnosis not present

## 2021-06-30 DIAGNOSIS — G4730 Sleep apnea, unspecified: Secondary | ICD-10-CM | POA: Diagnosis not present

## 2021-06-30 DIAGNOSIS — Z86718 Personal history of other venous thrombosis and embolism: Secondary | ICD-10-CM | POA: Diagnosis not present

## 2021-06-30 DIAGNOSIS — Z9181 History of falling: Secondary | ICD-10-CM | POA: Diagnosis not present

## 2021-06-30 DIAGNOSIS — Z471 Aftercare following joint replacement surgery: Secondary | ICD-10-CM | POA: Diagnosis not present

## 2021-06-30 DIAGNOSIS — Z8673 Personal history of transient ischemic attack (TIA), and cerebral infarction without residual deficits: Secondary | ICD-10-CM | POA: Diagnosis not present

## 2021-06-30 NOTE — Telephone Encounter (Signed)
Magda Paganini from [facility] called stating that outpatient pt did not receive faxed orders for outpatient pt. Please fax orders to [phone].

## 2021-06-30 NOTE — Discharge Summary (Signed)
Patient ID: Courtney Mcconnell MRN: [id number] DOB/AGE: 22-Dec-1955 66 y.o.  Admit date: 06/16/2021 Discharge date: 06/19/2021  Admission Diagnoses:  Principal Problem:   Arthritis of left knee   Discharge Diagnoses:  Principal Problem:   Arthritis of left knee  status post Procedure(s): LEFT TOTAL KNEE ARTHROPLASTY  Past Medical History:  Diagnosis Date   Asthma    Bursitis    Hip   CAD (coronary artery disease) 02/11/2019   PCI/DES x1 to mLAD, focal ostial 70% lesion in 1st diag   COPD (chronic obstructive pulmonary disease) ([place])    Essential hypertension    GERD (gastroesophageal reflux disease)    Headache(784.0)    Hyperlipidemia    NSTEMI (non-ST elevated myocardial infarction) ([place]) 02/11/2019   Pneumonia    Seizure ([place])    Sleep apnea     Surgeries: Procedure(s): LEFT TOTAL KNEE ARTHROPLASTY on 06/16/2021   Consultants:   Discharged Condition: Improved  Hospital Course: Courtney Mcconnell is an 66 y.o. female who was admitted 06/16/2021 for operative treatment of Arthritis of left knee. Patient failed conservative treatments (please see the history and physical for the specifics) and had severe unremitting pain that affects sleep, daily activities and work/hobbies. After pre-op clearance, the patient was taken to the operating room on 06/16/2021 and underwent  Procedure(s): LEFT TOTAL KNEE ARTHROPLASTY.    Patient was given perioperative antibiotics:  Anti-infectives (From admission, onward)    Start     Dose/Rate Route Frequency Ordered Stop   06/16/21 0600  vancomycin (VANCOCIN) IVPB 1000 mg/200 mL premix        1,000 mg 200 mL/hr over 60 Minutes Intravenous On call to O.R. 06/16/21 9892 06/16/21 0741        Patient was given sequential compression devices and early ambulation to prevent DVT.   Patient benefited maximally from hospital stay and there were no complications. At the time of discharge, the patient was urinating/moving their bowels without  difficulty, tolerating a regular diet, pain is controlled with oral pain medications and they have been cleared by PT/OT.   Recent vital signs: No data found.   Recent laboratory studies: No results for input(s): WBC, HGB, HCT, PLT, NA, K, CL, CO2, BUN, CREATININE, GLUCOSE, INR, CALCIUM in the last 72 hours.  Invalid input(s): PT, 2   Discharge Medications:   Allergies as of 06/19/2021       Reactions   Penicillins Hives   Bee Venom Swelling   Prednisone Other (See Comments)   Patient did not like how it made her feel, caused jittery feeling.    Augmentin [amoxicillin-pot Clavulanate] Rash   Doxycycline Hyclate Itching, Rash        Medication List     STOP taking these medications    acetaminophen 500 MG tablet Commonly known as: TYLENOL   aspirin EC 81 MG tablet   benzonatate 100 MG capsule Commonly known as: TESSALON       TAKE these medications    Advair Diskus 100-50 MCG/ACT Aepb Generic drug: fluticasone-salmeterol inhale ONE PUFF into THE lungs IN THE MORNING AND IN THE EVENING What changed: See the new instructions.   albuterol 108 (90 Base) MCG/ACT inhaler Commonly known as: Ventolin HFA INHALE TWO PUFFS INTO THE LUNGS EVERY 6 HOURS AS NEEDED FOR WHEEZING OR SHORTNESS OF BREATH   atorvastatin 40 MG tablet Commonly known as: LIPITOR TAKE 1 TABLET BY MOUTH DAILY AT 6PM   carvedilol 3.125 MG tablet Commonly known as: COREG TAKE 1 TABLET BY  MOUTH TWICE DAILY WITH A MEAL   diclofenac Sodium 1 % Gel Commonly known as: Voltaren Apply 2 g topically 4 (four) times daily. What changed:  when to take this reasons to take this   losartan 25 MG tablet Commonly known as: COZAAR TAKE 1 TABLET BY MOUTH EVERY DAY   methocarbamol 500 MG tablet Commonly known as: ROBAXIN Take 1 tablet (500 mg total) by mouth every 6 (six) hours as needed for muscle spasms. What changed: See the new instructions.   omeprazole 20 MG tablet Commonly known as: PRILOSEC  OTC Take 20 mg by mouth 2 (two) times daily.   rivaroxaban 10 MG Tabs tablet Commonly known as: XARELTO Take 1 tablet (10 mg total) by mouth daily. MUST TAKE 4-5 WEEKS POSTOP FOR DVT (BLOOD CLOT) PROPHYLAXIS What changed:  medication strength how much to take when to take this additional instructions   Spiriva HandiHaler 18 MCG inhalation capsule Generic drug: tiotropium INHALE 1 PUFF BY MOUTH DAILY What changed: See the new instructions.   Vimpat 150 MG Tabs Generic drug: Lacosamide Take 300 mg by mouth 2 (two) times daily.   zonisamide 100 MG capsule Commonly known as: ZONEGRAN Take 200 mg by mouth at bedtime.        Diagnostic Studies: DG Chest 2 View  Result Date: 06/08/2021 CLINICAL DATA:  Preoperative evaluation EXAM: CHEST - 2 VIEW COMPARISON:  Chest radiograph 04/06/2021 FINDINGS: The heart size and mediastinal contours are within normal limits. Both lungs are clear. The visualized skeletal structures are unremarkable. IMPRESSION: No active cardiopulmonary disease. Electronically Signed   By: Lovey Newcomer M.D.   On: 06/08/2021 16:09   DG Knee Left Port  Result Date: 06/16/2021 CLINICAL DATA:  Postop total knee replacement EXAM: PORTABLE LEFT KNEE - 1-2 VIEW COMPARISON:  04/11/2020 FINDINGS: Total knee arthroplasty. Components appear well positioned. No radiographically detectable complication. Small amount of fluid in air in the joint as expected. IMPRESSION: Good appearance following total knee arthroplasty. Electronically Signed   By: Nelson Chimes M.D.   On: 06/16/2021 13:04   EEG adult  Result Date: 06/18/2021 Lora Havens, MD     06/18/2021 10:42 AM Patient Name: Courtney Mcconnell MRN: [id number] Epilepsy Attending: Lora Havens Referring Physician/Provider: Kerney Elbe, MD Date: 06/18/2021 Duration: 22.27 mins Patient history: 66 year old female with a history of epilepsy, on Vimpat and Zonegran, admitted to Orthopaedics yesterday for left TKA, which she underwent  on the same day. This evening, the patient experienced 3 back to back breakthrough seizures that resolved with 0.5 mg IV Ativan. EEG to evaluate for seizure. Level of alertness: Awake AEDs during EEG study: ZNS Technical aspects: This EEG study was done with scalp electrodes positioned according to the 10-20 International system of electrode placement. Electrical activity was acquired at a sampling rate of '500Hz'$  and reviewed with a high frequency filter of '70Hz'$  and a low frequency filter of '1Hz'$ . EEG data were recorded continuously and digitally stored. Description: The posterior dominant rhythm consists of 8-9 Hz activity of moderate voltage (25-35 uV) seen predominantly in posterior head regions, symmetric and reactive to eye opening and eye closing. EEG showed intermittent generalized 3 to 6 Hz theta-delta slowing. Hyperventilation and photic stimulation were not performed.   ABNORMALITY - Intermittent slow, generalized IMPRESSION: This study is suggestive of mild diffuse encephalopathy, nonspecific etiology. No seizures or epileptiform discharges were seen throughout the recording. Priyanka Barbra Sarks   XR Knee 1-2 Views Left  Result Date: 06/29/2021 Standing  AP both knees lateral left knee shows well-positioned total knee arthroplasty.  No loosening of the side. Staples left knee are noted. Impression: Satisfactory left total knee arthroplasty      Follow-up Information     [name], MD. Go on 06/29/2021.   Specialty: Orthopedic Surgery Why: at 9:30 am in [place] office for your first post op appointment Contact information: [place] [address] [phone]         Health, [place] Follow up.   Specialty: [place] Why: home health PT services will be provided by [place] information: [address] [place] [address] [place] [address] [phone]                 Discharge Plan:  discharge to home  Disposition:      Signed: Benjiman Core  06/30/2021, 9:52 AM

## 2021-06-30 NOTE — Telephone Encounter (Signed)
Referral faxed

## 2021-07-03 NOTE — Telephone Encounter (Signed)
All info faxed.

## 2021-07-03 NOTE — Telephone Encounter (Signed)
[facility] received referral but they would like some more information like her demographics, insurance information and some notes about previous visits dealing with referral. They would like that faxed to [phone]

## 2021-07-05 ENCOUNTER — Telehealth: Payer: Self-pay | Admitting: *Deleted

## 2021-07-05 DIAGNOSIS — R262 Difficulty in walking, not elsewhere classified: Secondary | ICD-10-CM | POA: Diagnosis not present

## 2021-07-05 DIAGNOSIS — M25562 Pain in left knee: Secondary | ICD-10-CM | POA: Diagnosis not present

## 2021-07-05 DIAGNOSIS — Z96652 Presence of left artificial knee joint: Secondary | ICD-10-CM | POA: Diagnosis not present

## 2021-07-05 DIAGNOSIS — M25662 Stiffness of left knee, not elsewhere classified: Secondary | ICD-10-CM | POA: Diagnosis not present

## 2021-07-05 NOTE — Telephone Encounter (Signed)
Ortho bundle 14 day call completed. No new needs.

## 2021-07-07 DIAGNOSIS — M25562 Pain in left knee: Secondary | ICD-10-CM | POA: Diagnosis not present

## 2021-07-07 DIAGNOSIS — R262 Difficulty in walking, not elsewhere classified: Secondary | ICD-10-CM | POA: Diagnosis not present

## 2021-07-07 DIAGNOSIS — Z96652 Presence of left artificial knee joint: Secondary | ICD-10-CM | POA: Diagnosis not present

## 2021-07-07 DIAGNOSIS — M25662 Stiffness of left knee, not elsewhere classified: Secondary | ICD-10-CM | POA: Diagnosis not present

## 2021-07-12 DIAGNOSIS — Z96652 Presence of left artificial knee joint: Secondary | ICD-10-CM | POA: Diagnosis not present

## 2021-07-12 DIAGNOSIS — M25562 Pain in left knee: Secondary | ICD-10-CM | POA: Diagnosis not present

## 2021-07-12 DIAGNOSIS — R262 Difficulty in walking, not elsewhere classified: Secondary | ICD-10-CM | POA: Diagnosis not present

## 2021-07-12 DIAGNOSIS — M25662 Stiffness of left knee, not elsewhere classified: Secondary | ICD-10-CM | POA: Diagnosis not present

## 2021-07-14 DIAGNOSIS — M25562 Pain in left knee: Secondary | ICD-10-CM | POA: Diagnosis not present

## 2021-07-14 DIAGNOSIS — R262 Difficulty in walking, not elsewhere classified: Secondary | ICD-10-CM | POA: Diagnosis not present

## 2021-07-14 DIAGNOSIS — Z96652 Presence of left artificial knee joint: Secondary | ICD-10-CM | POA: Diagnosis not present

## 2021-07-14 DIAGNOSIS — M25662 Stiffness of left knee, not elsewhere classified: Secondary | ICD-10-CM | POA: Diagnosis not present

## 2021-07-19 ENCOUNTER — Other Ambulatory Visit: Payer: Self-pay | Admitting: Surgery

## 2021-07-19 DIAGNOSIS — R262 Difficulty in walking, not elsewhere classified: Secondary | ICD-10-CM | POA: Diagnosis not present

## 2021-07-19 DIAGNOSIS — Z96652 Presence of left artificial knee joint: Secondary | ICD-10-CM | POA: Diagnosis not present

## 2021-07-19 DIAGNOSIS — M25562 Pain in left knee: Secondary | ICD-10-CM | POA: Diagnosis not present

## 2021-07-19 DIAGNOSIS — M25662 Stiffness of left knee, not elsewhere classified: Secondary | ICD-10-CM | POA: Diagnosis not present

## 2021-07-20 ENCOUNTER — Telehealth: Payer: Self-pay | Admitting: *Deleted

## 2021-07-20 ENCOUNTER — Other Ambulatory Visit: Payer: Self-pay | Admitting: Orthopaedic Surgery

## 2021-07-20 NOTE — Telephone Encounter (Signed)
Ortho bundle 30 day call completed. °

## 2021-07-21 DIAGNOSIS — R262 Difficulty in walking, not elsewhere classified: Secondary | ICD-10-CM | POA: Diagnosis not present

## 2021-07-21 DIAGNOSIS — M25662 Stiffness of left knee, not elsewhere classified: Secondary | ICD-10-CM | POA: Diagnosis not present

## 2021-07-21 DIAGNOSIS — M25562 Pain in left knee: Secondary | ICD-10-CM | POA: Diagnosis not present

## 2021-07-21 DIAGNOSIS — Z96652 Presence of left artificial knee joint: Secondary | ICD-10-CM | POA: Diagnosis not present

## 2021-07-26 DIAGNOSIS — Z96652 Presence of left artificial knee joint: Secondary | ICD-10-CM | POA: Diagnosis not present

## 2021-07-26 DIAGNOSIS — M25662 Stiffness of left knee, not elsewhere classified: Secondary | ICD-10-CM | POA: Diagnosis not present

## 2021-07-26 DIAGNOSIS — M25562 Pain in left knee: Secondary | ICD-10-CM | POA: Diagnosis not present

## 2021-07-26 DIAGNOSIS — R262 Difficulty in walking, not elsewhere classified: Secondary | ICD-10-CM | POA: Diagnosis not present

## 2021-07-28 ENCOUNTER — Other Ambulatory Visit: Payer: Self-pay | Admitting: Surgery

## 2021-07-28 DIAGNOSIS — R262 Difficulty in walking, not elsewhere classified: Secondary | ICD-10-CM | POA: Diagnosis not present

## 2021-07-28 DIAGNOSIS — M25662 Stiffness of left knee, not elsewhere classified: Secondary | ICD-10-CM | POA: Diagnosis not present

## 2021-07-28 DIAGNOSIS — Z96652 Presence of left artificial knee joint: Secondary | ICD-10-CM | POA: Diagnosis not present

## 2021-07-28 DIAGNOSIS — M25562 Pain in left knee: Secondary | ICD-10-CM | POA: Diagnosis not present

## 2021-07-28 MED ORDER — HYDROCODONE-ACETAMINOPHEN 7.5-325 MG PO TABS: 1.0000 | ORAL_TABLET | Freq: Four times a day (QID) | ORAL | 0 refills | Status: DC | PRN

## 2021-08-02 DIAGNOSIS — R262 Difficulty in walking, not elsewhere classified: Secondary | ICD-10-CM | POA: Diagnosis not present

## 2021-08-02 DIAGNOSIS — M25562 Pain in left knee: Secondary | ICD-10-CM | POA: Diagnosis not present

## 2021-08-02 DIAGNOSIS — Z96652 Presence of left artificial knee joint: Secondary | ICD-10-CM | POA: Diagnosis not present

## 2021-08-02 DIAGNOSIS — M25662 Stiffness of left knee, not elsewhere classified: Secondary | ICD-10-CM | POA: Diagnosis not present

## 2021-08-03 ENCOUNTER — Ambulatory Visit (INDEPENDENT_AMBULATORY_CARE_PROVIDER_SITE_OTHER): Payer: Medicare Other | Admitting: Orthopaedic Surgery

## 2021-08-03 DIAGNOSIS — Z96652 Presence of left artificial knee joint: Secondary | ICD-10-CM | POA: Insufficient documentation

## 2021-08-04 DIAGNOSIS — Z96652 Presence of left artificial knee joint: Secondary | ICD-10-CM | POA: Diagnosis not present

## 2021-08-04 DIAGNOSIS — M25662 Stiffness of left knee, not elsewhere classified: Secondary | ICD-10-CM | POA: Diagnosis not present

## 2021-08-04 DIAGNOSIS — M25562 Pain in left knee: Secondary | ICD-10-CM | POA: Diagnosis not present

## 2021-08-04 DIAGNOSIS — R262 Difficulty in walking, not elsewhere classified: Secondary | ICD-10-CM | POA: Diagnosis not present

## 2021-08-09 DIAGNOSIS — M25662 Stiffness of left knee, not elsewhere classified: Secondary | ICD-10-CM | POA: Diagnosis not present

## 2021-08-09 DIAGNOSIS — Z96652 Presence of left artificial knee joint: Secondary | ICD-10-CM | POA: Diagnosis not present

## 2021-08-09 DIAGNOSIS — M25562 Pain in left knee: Secondary | ICD-10-CM | POA: Diagnosis not present

## 2021-08-09 DIAGNOSIS — R262 Difficulty in walking, not elsewhere classified: Secondary | ICD-10-CM | POA: Diagnosis not present

## 2021-08-10 ENCOUNTER — Other Ambulatory Visit: Payer: Self-pay | Admitting: Family Medicine

## 2021-08-10 DIAGNOSIS — J209 Acute bronchitis, unspecified: Secondary | ICD-10-CM

## 2021-08-11 DIAGNOSIS — Z96652 Presence of left artificial knee joint: Secondary | ICD-10-CM | POA: Diagnosis not present

## 2021-08-11 DIAGNOSIS — R262 Difficulty in walking, not elsewhere classified: Secondary | ICD-10-CM | POA: Diagnosis not present

## 2021-08-11 DIAGNOSIS — M25662 Stiffness of left knee, not elsewhere classified: Secondary | ICD-10-CM | POA: Diagnosis not present

## 2021-08-11 DIAGNOSIS — M25562 Pain in left knee: Secondary | ICD-10-CM | POA: Diagnosis not present

## 2021-08-16 DIAGNOSIS — M25662 Stiffness of left knee, not elsewhere classified: Secondary | ICD-10-CM | POA: Diagnosis not present

## 2021-08-16 DIAGNOSIS — R262 Difficulty in walking, not elsewhere classified: Secondary | ICD-10-CM | POA: Diagnosis not present

## 2021-08-16 DIAGNOSIS — Z96652 Presence of left artificial knee joint: Secondary | ICD-10-CM | POA: Diagnosis not present

## 2021-08-16 DIAGNOSIS — M25562 Pain in left knee: Secondary | ICD-10-CM | POA: Diagnosis not present

## 2021-08-18 DIAGNOSIS — M25562 Pain in left knee: Secondary | ICD-10-CM | POA: Diagnosis not present

## 2021-08-18 DIAGNOSIS — R262 Difficulty in walking, not elsewhere classified: Secondary | ICD-10-CM | POA: Diagnosis not present

## 2021-08-18 DIAGNOSIS — Z96652 Presence of left artificial knee joint: Secondary | ICD-10-CM | POA: Diagnosis not present

## 2021-08-18 DIAGNOSIS — M25662 Stiffness of left knee, not elsewhere classified: Secondary | ICD-10-CM | POA: Diagnosis not present

## 2021-08-23 DIAGNOSIS — R262 Difficulty in walking, not elsewhere classified: Secondary | ICD-10-CM | POA: Diagnosis not present

## 2021-08-23 DIAGNOSIS — M25562 Pain in left knee: Secondary | ICD-10-CM | POA: Diagnosis not present

## 2021-08-23 DIAGNOSIS — Z96652 Presence of left artificial knee joint: Secondary | ICD-10-CM | POA: Diagnosis not present

## 2021-08-23 DIAGNOSIS — M25662 Stiffness of left knee, not elsewhere classified: Secondary | ICD-10-CM | POA: Diagnosis not present

## 2021-08-24 ENCOUNTER — Other Ambulatory Visit: Payer: Self-pay

## 2021-08-24 ENCOUNTER — Encounter (HOSPITAL_COMMUNITY): Payer: Self-pay

## 2021-08-24 ENCOUNTER — Emergency Department (HOSPITAL_COMMUNITY): Payer: Medicare Other

## 2021-08-24 ENCOUNTER — Emergency Department (HOSPITAL_COMMUNITY)
Admission: EM | Admit: 2021-08-24 | Discharge: 2021-08-24 | Disposition: A | Discharge status: Home / Self Care | Payer: Medicare Other | Attending: Emergency Medicine | Admitting: Emergency Medicine

## 2021-08-24 DIAGNOSIS — I2510 Atherosclerotic heart disease of native coronary artery without angina pectoris: Secondary | ICD-10-CM | POA: Diagnosis not present

## 2021-08-24 DIAGNOSIS — E876 Hypokalemia: Secondary | ICD-10-CM | POA: Insufficient documentation

## 2021-08-24 DIAGNOSIS — I214 Non-ST elevation (NSTEMI) myocardial infarction: Secondary | ICD-10-CM | POA: Insufficient documentation

## 2021-08-24 DIAGNOSIS — F17200 Nicotine dependence, unspecified, uncomplicated: Secondary | ICD-10-CM | POA: Diagnosis not present

## 2021-08-24 DIAGNOSIS — J441 Chronic obstructive pulmonary disease with (acute) exacerbation: Secondary | ICD-10-CM | POA: Diagnosis not present

## 2021-08-24 DIAGNOSIS — J449 Chronic obstructive pulmonary disease, unspecified: Secondary | ICD-10-CM | POA: Diagnosis not present

## 2021-08-24 DIAGNOSIS — G40909 Epilepsy, unspecified, not intractable, without status epilepticus: Secondary | ICD-10-CM | POA: Insufficient documentation

## 2021-08-24 DIAGNOSIS — Z20822 Contact with and (suspected) exposure to covid-19: Secondary | ICD-10-CM | POA: Insufficient documentation

## 2021-08-24 DIAGNOSIS — R059 Cough, unspecified: Secondary | ICD-10-CM | POA: Diagnosis not present

## 2021-08-24 DIAGNOSIS — I10 Essential (primary) hypertension: Secondary | ICD-10-CM | POA: Insufficient documentation

## 2021-08-24 DIAGNOSIS — Z79899 Other long term (current) drug therapy: Secondary | ICD-10-CM | POA: Diagnosis not present

## 2021-08-24 DIAGNOSIS — Z7951 Long term (current) use of inhaled steroids: Secondary | ICD-10-CM | POA: Insufficient documentation

## 2021-08-24 DIAGNOSIS — Z7901 Long term (current) use of anticoagulants: Secondary | ICD-10-CM | POA: Diagnosis not present

## 2021-08-24 DIAGNOSIS — I252 Old myocardial infarction: Secondary | ICD-10-CM

## 2021-08-24 LAB — CBC WITH DIFFERENTIAL/PLATELET
Abs Immature Granulocytes: 0.02 10*3/uL (ref 0.00–0.07)
Basophils Absolute: 0 10*3/uL (ref 0.0–0.1)
Basophils Relative: 1 %
Eosinophils Absolute: 0.1 10*3/uL (ref 0.0–0.5)
Eosinophils Relative: 1 %
HCT: 39.7 % (ref 36.0–46.0)
Hemoglobin: 12.7 g/dL (ref 12.0–15.0)
Immature Granulocytes: 0 %
Lymphocytes Relative: 34 %
Lymphs Abs: 2.3 10*3/uL (ref 0.7–4.0)
MCH: 31.2 pg (ref 26.0–34.0)
MCHC: 32 g/dL (ref 30.0–36.0)
MCV: 97.5 fL (ref 80.0–100.0)
Monocytes Absolute: 0.7 10*3/uL (ref 0.1–1.0)
Monocytes Relative: 11 %
Neutro Abs: 3.7 10*3/uL (ref 1.7–7.7)
Neutrophils Relative %: 53 %
Platelets: 208 10*3/uL (ref 150–400)
RBC: 4.07 MIL/uL (ref 3.87–5.11)
RDW: 13.5 % (ref 11.5–15.5)
WBC: 6.9 10*3/uL (ref 4.0–10.5)
nRBC: 0 % (ref 0.0–0.2)

## 2021-08-24 LAB — BASIC METABOLIC PANEL
Anion gap: 6 (ref 5–15)
BUN: 11 mg/dL (ref 8–23)
CO2: 26 mmol/L (ref 22–32)
Calcium: 8.6 mg/dL — ABNORMAL LOW (ref 8.9–10.3)
Chloride: 104 mmol/L (ref 98–111)
Creatinine, Ser: 0.78 mg/dL (ref 0.44–1.00)
GFR, Estimated: >60 mL/min (ref >60)
Glucose, Bld: 113 mg/dL — ABNORMAL HIGH (ref 70–99)
Potassium: 3.1 mmol/L — ABNORMAL LOW (ref 3.5–5.1)
Sodium: 136 mmol/L (ref 135–145)

## 2021-08-24 LAB — SARS CORONAVIRUS 2 BY RT PCR: SARS Coronavirus 2 by RT PCR: NEGATIVE

## 2021-08-24 MED ORDER — CETIRIZINE HCL 10 MG PO TABS: 10.0000 mg | ORAL_TABLET | Freq: Every day | ORAL | 0 refills | Status: AC

## 2021-08-24 MED ORDER — PREDNISONE 20 MG PO TABS: 40.0000 mg | ORAL_TABLET | Freq: Every day | ORAL | 0 refills | Status: AC

## 2021-08-24 MED ORDER — PREDNISONE 20 MG PO TABS
40.0000 mg | ORAL_TABLET | Freq: Once | ORAL | Status: AC
  Administered 2021-08-24: 40 mg via ORAL
  Filled 2021-08-24: qty 2

## 2021-08-24 MED ORDER — IPRATROPIUM-ALBUTEROL 0.5-2.5 (3) MG/3ML IN SOLN
3.0000 mL | Freq: Once | RESPIRATORY_TRACT | Status: AC
  Administered 2021-08-24: 3 mL via RESPIRATORY_TRACT
  Filled 2021-08-24: qty 3

## 2021-08-24 MED ORDER — AZITHROMYCIN 250 MG PO TABS: 500.0000 mg | ORAL_TABLET | Freq: Every day | ORAL | 0 refills | Status: AC

## 2021-08-24 NOTE — ED Notes (Signed)
Dc instructions and scripts reviewed with pt no questions or concerns at this time. Will follow up with pcp.  

## 2021-08-24 NOTE — ED Triage Notes (Signed)
Pt presents to ED with complaints of productive cough x 2 days, states also has pain when she takes a deep breath in on the left side.

## 2021-08-24 NOTE — Discharge Instructions (Signed)
3 prescriptions have been sent to your pharmacy, they are as follows: Azithromycin-an antibiotic.  Take 2 tabs daily for the next 4 days.  Always take with plenty of food and water. Prednisone-a steroid.  meant to reduce inflammation and open up your lungs.  Take 2 tabs daily for the next 5 days.  Always take with plenty of food and water. Zyrtec-a decongestant.  Take 1 tablet daily for the next 1 to 2 weeks.  Continue to rest, take your Advair, use your albuterol as needed, and stay hydrated.  Follow-up with your PCP within the next 2 to 3 days for reevaluation continue medical management.  Return to the ED for new or worsening symptoms as discussed.

## 2021-08-24 NOTE — ED Provider Notes (Signed)
[facility] EMERGENCY DEPARTMENT Provider Note   CSN: [id number] Arrival date & time: 08/24/21  1245     History  Chief Complaint  Patient presents with  . Cough    Courtney Mcconnell is a 66 y.o. female with Hx of COPD, essential HTN, hyperlipidemia, CAD with stent placement, seizure disorder, chronic tobacco use, prior DVT.  Presenting with sudden onset non-bloody cough and congestion 2 days ago.  Denies fevers, but endorses possible chills.  Not on O2 at home.  Mild discomfort with normal breathing, but denies shortness of breath.  Endorses some nausea that started yesterday, but no vomiting.  Nausea is very mild and comes and goes.  Also with decreased appetite.  No recent contact with people of similar symptomology.  Denies neck stiffness, sore throat, dysphagia, headache, ear pain, vision changes, chest pain, abdominal pain.  Without leg swelling, tenderness, warmth, or erythema.  On Xarelto.  The history is provided by the patient and medical records.  Cough      Home Medications Prior to Admission medications   Medication Sig Start Date End Date Taking? Authorizing Provider  ADVAIR DISKUS 100-50 MCG/DOSE AEPB inhale ONE PUFF into THE lungs IN THE MORNING AND IN THE EVENING Patient taking differently: Inhale 1 puff into the lungs 2 (two) times daily. 05/16/20   Dettinger, Elige Radon, MD  albuterol (VENTOLIN HFA) 108 (90 Base) MCG/ACT inhaler INHALE TWO PUFFS INTO THE LUNGS EVERY 6 HOURS AS NEEDED FOR WHEEZING OR SHORTNESS OF BREATH 06/10/20   Dettinger, Elige Radon, MD  atorvastatin (LIPITOR) 40 MG tablet TAKE 1 TABLET BY MOUTH DAILY AT [facility] 12/22/20   Jonelle Sidle, MD  carvedilol (COREG) 3.125 MG tablet TAKE 1 TABLET BY MOUTH TWICE DAILY WITH A MEAL 11/14/20   Marykay Lex, MD  diclofenac Sodium (VOLTAREN) 1 % GEL Apply 2 g topically 4 (four) times daily. Patient taking differently: Apply 2 g topically 2 (two) times daily as needed (knee pain). 04/14/20   Dettinger, Elige Radon, MD   HYDROcodone-acetaminophen (NORCO) 7.5-325 MG tablet Take 1 tablet by mouth every 6 (six) hours as needed for moderate pain. 07/28/21   Naida Sleight, PA-C  Lacosamide (VIMPAT) 150 MG TABS Take 300 mg by mouth 2 (two) times daily. 11/02/19   [provider]  losartan (COZAAR) 25 MG tablet TAKE 1 TABLET BY MOUTH EVERY DAY 05/15/21   Marykay Lex, MD  methocarbamol (ROBAXIN) 500 MG tablet Take 1 tablet (500 mg total) by mouth every 8 (eight) hours as needed for muscle spasms. 07/24/21   Naida Sleight, PA-C  nitroGLYCERIN (NITROSTAT) 0.4 MG SL tablet PLACE ONE TABLET UNDER THE TONGUE EVERY FIVE MINUTES AS NEEDED FOR CHEST PAIN 06/26/21   Dettinger, Elige Radon, MD  omeprazole (PRILOSEC OTC) 20 MG tablet Take 20 mg by mouth 2 (two) times daily.    [provider]  oxyCODONE-acetaminophen (PERCOCET/ROXICET) 5-325 MG tablet Take 1-2 tablets by mouth every 6 (six) hours as needed for severe pain. 06/29/21   Eldred Manges, MD  rivaroxaban (XARELTO) 10 MG TABS tablet Take 1 tablet (10 mg total) by mouth daily. MUST TAKE 4-5 WEEKS POSTOP FOR DVT (BLOOD CLOT) PROPHYLAXIS 06/16/21   Naida Sleight, PA-C  tiotropium (SPIRIVA HANDIHALER) 18 MCG inhalation capsule Place 1 capsule (18 mcg total) into inhaler and inhale daily. (NEEDS TO BE SEEN BEFORE NEXT REFILL) 08/10/21   Dettinger, Elige Radon, MD  zonisamide (ZONEGRAN) 100 MG capsule Take 200 mg by mouth at bedtime.  07/18/20   [provider]      Allergies    Penicillins, Bee venom, Prednisone, Augmentin [amoxicillin-pot clavulanate], and Doxycycline hyclate    Review of Systems   Review of Systems  Respiratory:  Positive for cough.     Physical Exam Updated Vital Signs BP (!) 152/95 (BP Location: Right Arm)   Pulse 65   Temp 98.1 F (36.7 C) (Oral)   Resp 20   Ht 5\' 6"  (1.676 m)   Wt 104.3 kg   SpO2 98%   BMI 37.12 kg/m  Physical Exam Vitals and nursing note reviewed.  Constitutional:      General: She is not in acute  distress.    Appearance: She is well-developed. She is not ill-appearing, toxic-appearing or diaphoretic.  HENT:     Head: Normocephalic and atraumatic.     Right Ear: External ear normal.     Left Ear: External ear normal.     Nose: Rhinorrhea present. Rhinorrhea is clear.     Right Turbinates: Enlarged.     Left Turbinates: Enlarged.     Mouth/Throat:     Lips: Pink.     Mouth: Mucous membranes are moist.     Tongue: Tongue does not deviate from midline.     Palate: No mass.     Pharynx: Oropharynx is clear. Uvula midline. No pharyngeal swelling, oropharyngeal exudate, posterior oropharyngeal erythema or uvula swelling.     Tonsils: No tonsillar exudate or tonsillar abscesses.  Eyes:     General: No scleral icterus.    Conjunctiva/sclera: Conjunctivae normal.  Neck:     Comments: Very supple on exam Cardiovascular:     Rate and Rhythm: Normal rate and regular rhythm.     Pulses: Normal pulses.     Heart sounds: No murmur heard. Pulmonary:     Effort: Pulmonary effort is normal. No respiratory distress.     Breath sounds: No stridor. Rhonchi present.     Comments: Bilateral rales, left worse than the right Chest:     Chest wall: No tenderness.  Abdominal:     Palpations: Abdomen is soft.     Tenderness: There is no abdominal tenderness.  Musculoskeletal:        General: No swelling or tenderness.     Cervical back: Neck supple. No rigidity or tenderness.     Right lower leg: No edema.     Left lower leg: No edema.  Lymphadenopathy:     Cervical: No cervical adenopathy.  Skin:    General: Skin is warm and dry.     Capillary Refill: Capillary refill takes less than 2 seconds.     Coloration: Skin is not jaundiced or pale.     Findings: No erythema.  Neurological:     Mental Status: She is alert and oriented to person, place, and time.  Psychiatric:        Mood and Affect: Mood normal.    ED Results / Procedures / Treatments   Labs (all labs ordered are listed, but  only abnormal results are displayed) Labs Reviewed  BASIC METABOLIC PANEL - Abnormal; Notable for the following components:      Result Value   Potassium 3.1 (*)    Glucose, Bld 113 (*)    Calcium 8.6 (*)    All other components within normal limits  SARS CORONAVIRUS 2 BY RT PCR  CBC WITH DIFFERENTIAL/PLATELET    EKG EKG Interpretation  Date/Time:  Thursday August 24 2021 14:51:43 EDT Ventricular  Rate:  59 PR Interval:  196 QRS Duration: 107 QT Interval:  440 QTC Calculation: 436 R Axis:   -27 Text Interpretation: Sinus rhythm Borderline left axis deviation RSR' in V1 or V2, right VCD or RVH Borderline T abnormalities, anterior leads No significant change since prior 4/23 Confirmed by Meridee Score [phone]) on 08/24/2021 3:05:39 PM  Radiology DG Chest 2 View  Result Date: 08/24/2021 CLINICAL DATA:  COPD, cough EXAM: CHEST - 2 VIEW COMPARISON:  06/08/2021 FINDINGS: Similar parenchymal scarring and mild hyperinflation compatible with background COPD/emphysema. No superimposed acute airspace process, pneumonia, collapse or consolidation. Negative for edema, effusion or pneumothorax. Trachea midline. Normal heart size and vascularity. Degenerative changes of the spine. Remote cholecystectomy. IMPRESSION: Stable exam.  No active chest disease. Electronically Signed   By: Judie Petit.  Shick M.D.   On: 08/24/2021 14:43    Procedures Procedures    Medications Ordered in ED Medications  ipratropium-albuterol (DUONEB) 0.5-2.5 (3) MG/3ML nebulizer solution 3 mL (has no administration in time range)    ED Course/ Medical Decision Making/ A&P                           Medical Decision Making Amount and/or Complexity of Data Reviewed Labs: ordered. Radiology: ordered.  Risk OTC drugs. Prescription drug management.   66 y.o. female presents to the ED for concern of Cough   This involves an extensive number of treatment options, and is a complaint that carries with it a high risk of  complications and morbidity.  The emergent differential diagnosis prior to evaluation includes, but is not limited to: COPD exacerbation, viral pharyngitis, strep pharyngitis, allergic rhinitis, pneumonia  This is not an exhaustive differential.   Past Medical History / Co-morbidities / Social History: Hx of  COPD, essential HTN, hyperlipidemia, CAD with stent placement, seizure disorder, chronic tobacco use, prior DVT Social Determinants of Health include chronic tobacco use, for which cessation counseling was provided.  Additional History:  Internal and external records from outside source obtained and reviewed including ED visits  Lab Tests: I ordered, and personally interpreted labs.  The pertinent results include:   CBC: No leukocytosis CMP/BMP: Mild hypokalemia 3.1 Covid: Negative  Imaging Studies: I ordered imaging studies including CXR.   I independently visualized and interpreted imaging which showed no evidence of pneumothorax or pneumonia I agree with the radiologist interpretation.  ED Course: Pt well-appearing on exam.  Nonseptic, not ill-appearing in NAD.  AAOx4, sitting comfortably.  Presented to the ED with 2 days of cough, orthopnea, and increased sputum production, concerning for exacerbation of COPD.  Upon arrival, the patient was satting well (Sp02 98 on room air). Other vital signs were stable.  Patient was evaluated with pulse oximetry. CXR negative for pneumonia or pneumothorax.  Also not indicative of pleural effusion or pulmonary edema.  Basic labs drawn, no leukocytosis. EKG: normal sinus rhythm, unchanged from previous tracings.  No clinical suggestion of DVT.  Patient is not tachycardic, without hemoptysis, no clinical evidence of DVT, satting at 98-100% on room air, low suspicion for pulmonary embolism at this time.  Does not appear fluid overloaded, without evidence of lower extremity edema.  Rhonchi appreciated on exam, CXR negative for acute findings.  No recent  trauma, low suspicion for acute rib fracture.  Does not appear cyanotic, not using pursed lip exhalation, no accessory muscle use.  Does not appear to be in respiratory distress.  Ventilation unnecessary at this time.  Clinical  exam not suggestive of acute pharyngitis.  No meningismus, neck very supple on exam, afebrile, low suspicion for meningitis at this time.  Plan to proceed with DuoNeb and prednisone and reassess. Upon reevaluation, patient states she feels much better and feels ready to go home.  Patient having mild symptoms overall and has maintained her O2 saturation above 98% on room air.  Plan to proceed with outpatient antibiotic treatment, decongestant, and short course of prednisone.  Recommend close follow-up with PCP for reevaluation.  Patient satisfied with today's encounter, states she has enough of her inhalers at home.  In NAD and in good condition at time of discharge.  Disposition: After consideration the patient's encounter today, I do not feel today's workup suggests an emergent condition requiring admission or immediate intervention beyond what has been performed at this time.  Safe for discharge; instructed to return immediately for worsening symptoms, change in symptoms or any other concerns.  I have reviewed the patients home medicines and have made adjustments as needed.  Discussed course of treatment with the patient, whom demonstrated understanding.  Patient in agreement and has no further questions.    I discussed this case with my attending physician Dr. Criss Alvine, who agreed with the proposed treatment course and cosigned this note including patient's presenting symptoms, physical exam, and planned diagnostics and interventions.  Attending physician stated agreement with plan or made changes to plan which were implemented.     This chart was dictated using voice recognition software.  Despite best efforts to proofread, errors can occur which can change the documentation  meaning.          Final Clinical Impression(s) / ED Diagnoses Final diagnoses:  COPD with acute exacerbation (HCC)  Tobacco use disorder  Seizure disorder (HCC)  History of non-ST elevation myocardial infarction (NSTEMI)  Essential hypertension    Rx / DC Orders ED Discharge Orders          Ordered    azithromycin (ZITHROMAX) 250 MG tablet  Daily        08/24/21 1627    cetirizine (ZYRTEC ALLERGY) 10 MG tablet  Daily        08/24/21 1627    predniSONE (DELTASONE) 20 MG tablet  Daily        08/24/21 1627              Cecil Cobbs, Cordelia Poche 08/27/21 0902    Pricilla Loveless, MD 08/28/21 [phone]

## 2021-08-29 DIAGNOSIS — M25662 Stiffness of left knee, not elsewhere classified: Secondary | ICD-10-CM | POA: Diagnosis not present

## 2021-08-29 DIAGNOSIS — Z96652 Presence of left artificial knee joint: Secondary | ICD-10-CM | POA: Diagnosis not present

## 2021-08-29 DIAGNOSIS — M25562 Pain in left knee: Secondary | ICD-10-CM | POA: Diagnosis not present

## 2021-08-29 DIAGNOSIS — R262 Difficulty in walking, not elsewhere classified: Secondary | ICD-10-CM | POA: Diagnosis not present

## 2021-09-01 DIAGNOSIS — M25662 Stiffness of left knee, not elsewhere classified: Secondary | ICD-10-CM | POA: Diagnosis not present

## 2021-09-01 DIAGNOSIS — M25562 Pain in left knee: Secondary | ICD-10-CM | POA: Diagnosis not present

## 2021-09-01 DIAGNOSIS — R262 Difficulty in walking, not elsewhere classified: Secondary | ICD-10-CM | POA: Diagnosis not present

## 2021-09-01 DIAGNOSIS — Z96652 Presence of left artificial knee joint: Secondary | ICD-10-CM | POA: Diagnosis not present

## 2021-09-06 DIAGNOSIS — R262 Difficulty in walking, not elsewhere classified: Secondary | ICD-10-CM | POA: Diagnosis not present

## 2021-09-06 DIAGNOSIS — Z96652 Presence of left artificial knee joint: Secondary | ICD-10-CM | POA: Diagnosis not present

## 2021-09-06 DIAGNOSIS — M25662 Stiffness of left knee, not elsewhere classified: Secondary | ICD-10-CM | POA: Diagnosis not present

## 2021-09-06 DIAGNOSIS — M25562 Pain in left knee: Secondary | ICD-10-CM | POA: Diagnosis not present

## 2021-09-07 ENCOUNTER — Ambulatory Visit (INDEPENDENT_AMBULATORY_CARE_PROVIDER_SITE_OTHER): Payer: Medicare Other | Admitting: Orthopaedic Surgery

## 2021-09-07 DIAGNOSIS — Z96652 Presence of left artificial knee joint: Secondary | ICD-10-CM

## 2021-09-07 NOTE — Progress Notes (Signed)
Post-Op Visit Note   Patient: Courtney Mcconnell           Date of Birth: [birth year]           MRN: [id number] Visit Date: 09/07/2021 PCP: Dettinger, Fransisca Kaufmann, MD   Assessment & Plan: Post left total knee arthroplasty.  She has been doing prone positioning but only 5 pounds over the last 5 minutes and still is not out in extension her flexion is good her quad strength is good her ambulation is good but she still needs to get the last 10 degrees to get to full extension.  We went over again how to do home stretching.  She can follow-up with me in a month if she so desires.  Opposite knee doing well and flexion is good.  Chief Complaint:  Chief Complaint  Patient presents with   Left Knee - Routine Post Op    TKR 06/16/21   Visit Diagnoses:  1. S/P total knee arthroplasty, left     Plan: Continue home extension work.  Strength is improving flexion is good.  She is happy with results surgery.  Follow-Up Instructions: No follow-ups on file.   Orders:  No orders of the defined types were placed in this encounter.  No orders of the defined types were placed in this encounter.   Imaging: No results found.  PMFS History: Patient Active Problem List   Diagnosis Date Noted   S/P total knee arthroplasty, left 08/03/2021   Arthritis of left knee 06/16/2021   DVT (deep venous thrombosis) ([place]) 01/19/2021   S/P TKR (total knee replacement), right 01/09/2021   Rheumatoid arthritis with rheumatoid factor of multiple sites without organ or systems involvement ([place]) 12/02/2020   Coronary artery disease involving native coronary artery of native heart with angina pectoris ([place]) 05/22/2019   Hyperlipidemia with target LDL less than 70 -> CAD 02/12/2019   Presence of drug coated stent in LAD coronary artery 02/11/2019   History of non-ST elevation myocardial infarction (NSTEMI) 02/10/2019   Cerebral infarction, remote, resolved 06/27/2018   Pulmonary nodules 12/08/2015   Sleep apnea 04/06/2015    Pulmonary emphysema ([place]) 03/02/2015   Smoker 02/16/2015   Seizure disorder ([place]) 01/21/2015   Focal epilepsy with impairment of consciousness ([place]) 12/13/2014   Essential hypertension 04/18/2014   Preretinal fibrosis, 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   Tobacco use disorder 07/19/2012   Past Medical History:  Diagnosis Date   Asthma    Bursitis    Hip   CAD (coronary artery disease) 02/11/2019   PCI/DES x1 to mLAD, focal ostial 70% lesion in 1st diag   COPD (chronic obstructive pulmonary disease) ([place])    Essential hypertension    GERD (gastroesophageal reflux disease)    Headache(784.0)    Hyperlipidemia    NSTEMI (non-ST elevated myocardial infarction) ([place]) 02/11/2019   Pneumonia    Seizure ([place])    Sleep apnea     Family History  Problem Relation Age of Onset   COPD Mother    Diabetes Father    Hypertension Father    Hyperlipidemia Father    Cancer Sister        Bone Cancer   Diabetes Brother    Cancer Brother    Cancer Paternal Uncle    Stroke Paternal Grandmother    Healthy Daughter    Healthy Son    Colon cancer Neg Hx     Past Surgical History:  Procedure Laterality Date   ABDOMINAL HYSTERECTOMY  APPENDECTOMY     CATARACT EXTRACTION Bilateral    CHOLECYSTECTOMY     CORONARY STENT INTERVENTION N/A 02/11/2019   Procedure: CORONARY STENT INTERVENTION;  Surgeon: Burnell Blanks, MD;  Location: [facility];; mid LAD 70% -- DES PCI (Synergy DES 2.5 x 28 --2.8 mm)   heal spur     right   INTRAVASCULAR PRESSURE WIRE/FFR STUDY N/A 02/11/2019   Procedure: INTRAVASCULAR PRESSURE WIRE/FFR STUDY;  Surgeon: Burnell Blanks, MD;  Location: [facility];  Service: Cardiovascular;  Laterality: N/A;   LASER PHOTO ABLATION Right 08/27/2013   Procedure: LASER PHOTO ABLATION;  Surgeon: Hayden Pedro, MD;  Location: [facility];  Service: Ophthalmology;  Laterality: Right;  Headscope laser AND Endolaser   LEFT HEART CATH AND CORONARY ANGIOGRAPHY N/A  02/11/2019   Procedure: LEFT HEART CATH AND CORONARY ANGIOGRAPHY;  Surgeon: Burnell Blanks, MD;  Location: [facility];;; prox RCA 20%. Ost-prox Cx 20%. ostial D1 70% & mLAD 70% => (DES PCI of LAD)   MEMBRANE PEEL Right 08/27/2013   Procedure: MEMBRANE PEEL;  Surgeon: Hayden Pedro, MD;  Location: [facility];  Service: Ophthalmology;  Laterality: Right;   PARS PLANA VITRECTOMY Right 08/27/2013   Procedure: PARS PLANA VITRECTOMY WITH 25 GAUGE;  Surgeon: Hayden Pedro, MD;  Location: [facility];  Service: Ophthalmology;  Laterality: Right;   TOTAL KNEE ARTHROPLASTY Right 01/09/2021   Procedure: RIGHT TOTAL KNEE ARTHROPLASTY;  Surgeon: [name], MD;  Location: [facility];  Service: Orthopedics;  Laterality: Right;  Needs RNFA   TOTAL KNEE ARTHROPLASTY Left 06/16/2021   Procedure: LEFT TOTAL KNEE ARTHROPLASTY;  Surgeon: [name], MD;  Location: [facility];  Service: Orthopedics;  Laterality: Left;   Social History   Occupational History   Occupation: Airline pilot: [employer]  Tobacco Use   Smoking status: Former    Packs/day: 0.25    Years: 42.00    Total pack years: 10.50    Types: Cigarettes    Quit date: 03/20/2014    Years since quitting: 7.4   Smokeless tobacco: Never   Tobacco comments:    01/21/15 restarted smoking 4 mos ago  Vaping Use   Vaping Use: Never used  Substance and Sexual Activity   Alcohol use: No    Alcohol/week: 0.0 standard drinks of alcohol   Drug use: No   Sexual activity: Not on file

## 2021-09-08 DIAGNOSIS — M25562 Pain in left knee: Secondary | ICD-10-CM | POA: Diagnosis not present

## 2021-09-08 DIAGNOSIS — Z96652 Presence of left artificial knee joint: Secondary | ICD-10-CM | POA: Diagnosis not present

## 2021-09-08 DIAGNOSIS — R262 Difficulty in walking, not elsewhere classified: Secondary | ICD-10-CM | POA: Diagnosis not present

## 2021-09-08 DIAGNOSIS — M25662 Stiffness of left knee, not elsewhere classified: Secondary | ICD-10-CM | POA: Diagnosis not present

## 2021-09-10 ENCOUNTER — Telehealth: Payer: Self-pay | Admitting: Family Medicine

## 2021-09-10 DIAGNOSIS — J209 Acute bronchitis, unspecified: Secondary | ICD-10-CM

## 2021-09-11 ENCOUNTER — Encounter: Payer: Self-pay | Admitting: Family Medicine

## 2021-09-11 MED ORDER — SPIRIVA HANDIHALER 18 MCG IN CAPS: 18.0000 ug | ORAL_CAPSULE | Freq: Every day | RESPIRATORY_TRACT | 0 refills | Status: DC

## 2021-09-11 NOTE — Telephone Encounter (Signed)
CALLED PT TO SCHEDULE APPT, LM LETTER MAILED

## 2021-09-11 NOTE — Telephone Encounter (Signed)
Refill sent to pharmacy.   

## 2021-09-11 NOTE — Telephone Encounter (Signed)
Pt called back to schedule 10/11/2021 with Dettinger next available at 2:10.

## 2021-09-11 NOTE — Telephone Encounter (Signed)
Dettinger NTBS 30 days given 08/10/21

## 2021-09-11 NOTE — Addendum Note (Signed)
Addended by: Antonietta Barcelona D on: 09/11/2021 01:21 PM   Modules accepted: Orders

## 2021-09-13 DIAGNOSIS — M25662 Stiffness of left knee, not elsewhere classified: Secondary | ICD-10-CM | POA: Diagnosis not present

## 2021-09-13 DIAGNOSIS — R262 Difficulty in walking, not elsewhere classified: Secondary | ICD-10-CM | POA: Diagnosis not present

## 2021-09-13 DIAGNOSIS — Z96652 Presence of left artificial knee joint: Secondary | ICD-10-CM | POA: Diagnosis not present

## 2021-09-13 DIAGNOSIS — M25562 Pain in left knee: Secondary | ICD-10-CM | POA: Diagnosis not present

## 2021-09-20 DIAGNOSIS — Z79899 Other long term (current) drug therapy: Secondary | ICD-10-CM | POA: Diagnosis not present

## 2021-09-20 DIAGNOSIS — Z881 Allergy status to other antibiotic agents status: Secondary | ICD-10-CM | POA: Diagnosis not present

## 2021-09-20 DIAGNOSIS — Z888 Allergy status to other drugs, medicaments and biological substances status: Secondary | ICD-10-CM | POA: Diagnosis not present

## 2021-09-20 DIAGNOSIS — Z880 Allergy status to penicillin: Secondary | ICD-10-CM | POA: Diagnosis not present

## 2021-10-04 NOTE — Patient Instructions (Signed)
Our records indicate that you are due for your annual mammogram/breast imaging. While there is no way to prevent breast cancer, early detection provides the best opportunity for curing it. For women over the age of 40, the American Cancer Society recommends a yearly clinical breast exam and a yearly mammogram. These practices have saved thousands of lives. We need your help to ensure that you are receiving optimal medical care. Please call the imaging location that has done you previous mammograms. Please remember to list us as your primary care. This helps make sure we receive a report and can update your chart.  Below is the contact information for several local breast imaging centers. You may call the location that works best for you, and they will be happy to assistance in making you an appointment. You do not need an order for a regular screening mammogram. However, if you are having any problems or concerns with you breast area, please let your primary care provider know, and appropriate orders will be placed. Please let our office know if you have any questions or concerns. Or if you need information for another imaging center not on this list or outside of the area. We are commented to working with you on your health care journey.   The mobile unit/bus ([facility]) - they come twice a month to our location.  These appointments can be made through our office or by call The Breast Center  [facility]  [address] [place], [place] [address] Phone [phone]  [facility] Radiology Department  [address]  [place], [place] [address] [phone]  [facility])  [address] [place], [place] [address] [phone]  [facility] [place]  [address] [place] [address] [phone]  [facility]  [address], [address] [place] [address] [phone]  [facility] [place]  [address] [place], [place] [address] [phone]  [facility] [address] [place], [place] [address] [phone]  [facility] [address]  [address] [place] [address] [place], [place] [address] [phone]  [facility] [address] [place] [address] [phone]     

## 2021-10-09 ENCOUNTER — Other Ambulatory Visit: Payer: Self-pay | Admitting: Family Medicine

## 2021-10-09 DIAGNOSIS — J209 Acute bronchitis, unspecified: Secondary | ICD-10-CM

## 2021-10-11 ENCOUNTER — Encounter: Payer: Self-pay | Admitting: Family Medicine

## 2021-10-11 ENCOUNTER — Ambulatory Visit (INDEPENDENT_AMBULATORY_CARE_PROVIDER_SITE_OTHER): Payer: Medicare Other | Admitting: Family Medicine

## 2021-10-11 VITALS — BP 131/84 | HR 62 | Temp 98.2°F | Ht 66.0 in | Wt 220.0 lb

## 2021-10-11 DIAGNOSIS — I10 Essential (primary) hypertension: Secondary | ICD-10-CM | POA: Diagnosis not present

## 2021-10-11 DIAGNOSIS — M1712 Unilateral primary osteoarthritis, left knee: Secondary | ICD-10-CM | POA: Diagnosis not present

## 2021-10-11 DIAGNOSIS — Z1211 Encounter for screening for malignant neoplasm of colon: Secondary | ICD-10-CM

## 2021-10-11 DIAGNOSIS — I25119 Atherosclerotic heart disease of native coronary artery with unspecified angina pectoris: Secondary | ICD-10-CM

## 2021-10-11 DIAGNOSIS — E785 Hyperlipidemia, unspecified: Secondary | ICD-10-CM | POA: Diagnosis not present

## 2021-10-11 DIAGNOSIS — J439 Emphysema, unspecified: Secondary | ICD-10-CM | POA: Diagnosis not present

## 2021-10-11 DIAGNOSIS — G40909 Epilepsy, unspecified, not intractable, without status epilepticus: Secondary | ICD-10-CM

## 2021-10-11 MED ORDER — DICLOFENAC SODIUM 1 % EX GEL: 2.0000 g | Freq: Four times a day (QID) | CUTANEOUS | 3 refills | Status: DC

## 2021-10-11 MED ORDER — ALBUTEROL SULFATE HFA 108 (90 BASE) MCG/ACT IN AERS: 2.0000 | INHALATION_SPRAY | Freq: Four times a day (QID) | RESPIRATORY_TRACT | 5 refills | Status: DC | PRN

## 2021-10-11 MED ORDER — SPIRIVA HANDIHALER 18 MCG IN CAPS: ORAL_CAPSULE | RESPIRATORY_TRACT | 3 refills | Status: DC

## 2021-10-11 NOTE — Progress Notes (Signed)
BP 131/84   Pulse 62   Temp 98.2 F (36.8 C)   Ht _0  (1.676 m)   Wt 220 lb (99.8 kg)   SpO2 94%   BMI 35.51 kg/m    Subjective:   Patient ID: Courtney Mcconnell, female    DOB: November 09, 1955, 66 y.o.   MRN: [id number]  HPI: Courtney Mcconnell is a 66 y.o. female presenting on 10/11/2021 for Medical Management of Chronic Issues and Hyperlipidemia   HPI Hypertension Patient is currently on losartan and carvedilol, and their blood pressure today is 131/84. Patient denies any lightheadedness or dizziness. Patient denies headaches, blurred vision, chest pains, shortness of breath, or weakness. Denies any side effects from medication and is content with current medication.   Patient also has seizure disorder and is on medicine for it and has not had any seizure issues outside of her normal.  COPD and emphysema Patient is coming in for COPD recheck today.  sHe is currently on Advair and Spiriva and albuterol as needed.  sHe has a mild chronic cough but denies any major coughing spells or wheezing spells.  sHe has 1 nighttime symptoms per week and 1 daytime symptoms per week currently.   Hyperlipidemia and CAD Patient is coming in for recheck of his hyperlipidemia. The patient is currently taking atorvastatin. They deny any issues with myalgias or history of liver damage from it. They deny any focal numbness or weakness or chest pain.   Relevant past medical, surgical, family and social history reviewed and updated as indicated. Interim medical history since our last visit reviewed. Allergies and medications reviewed and updated.  Review of Systems  Constitutional:  Negative for chills and fever.  HENT:  Negative for congestion, ear discharge and ear pain.   Eyes:  Negative for redness and visual disturbance.  Respiratory:  Positive for wheezing (Rare). Negative for chest tightness and shortness of breath.   Cardiovascular:  Negative for chest pain and leg swelling.  Genitourinary:  Negative  for difficulty urinating and dysuria.  Musculoskeletal:  Negative for back pain and gait problem.  Skin:  Negative for rash.  Neurological:  Negative for dizziness, light-headedness and headaches.  Psychiatric/Behavioral:  Negative for agitation and behavioral problems.   All other systems reviewed and are negative.   Per HPI unless specifically indicated above   Allergies as of 10/11/2021       Reactions   Penicillins Hives   Bee Venom Swelling   Prednisone Other (See Comments)   Patient did not like how it made her feel, caused jittery feeling.    Augmentin [amoxicillin-pot Clavulanate] Rash   Doxycycline Hyclate Itching, Rash        Medication List        Accurate as of October 11, 2021  2:30 PM. If you have any questions, ask your nurse or doctor.          STOP taking these medications    HYDROcodone-acetaminophen 7.5-325 MG tablet Commonly known as: [place] by: Worthy Rancher, MD   oxyCODONE-acetaminophen 5-325 MG tablet Commonly known as: PERCOCET/ROXICET Stopped by: Worthy Rancher, MD   rivaroxaban 10 MG Tabs tablet Commonly known as: XARELTO Stopped by: Fransisca Kaufmann Chamia Schmutz, MD       TAKE these medications    Advair Diskus 100-50 MCG/ACT Aepb Generic drug: fluticasone-salmeterol inhale ONE PUFF into THE lungs IN THE MORNING AND IN THE EVENING What changed: See the new instructions.   albuterol 108 (90 Base) MCG/ACT inhaler  Commonly known as: Ventolin HFA Inhale 2 puffs into the lungs every 6 (six) hours as needed for wheezing or shortness of breath. INHALE TWO PUFFS INTO THE LUNGS EVERY 6 HOURS AS NEEDED FOR WHEEZING OR SHORTNESS OF BREATH What changed:  how much to take how to take this when to take this reasons to take this Changed by: Fransisca Kaufmann Burech Mcfarland, MD   atorvastatin 40 MG tablet Commonly known as: LIPITOR TAKE 1 TABLET BY MOUTH DAILY AT 6PM   carvedilol 3.125 MG tablet Commonly known as: COREG TAKE 1 TABLET BY MOUTH  TWICE DAILY WITH A MEAL   cetirizine 10 MG tablet Commonly known as: ZyrTEC Allergy Take 1 tablet (10 mg total) by mouth daily for 14 days.   diclofenac Sodium 1 % Gel Commonly known as: Voltaren Apply 2 g topically 4 (four) times daily. What changed:  when to take this reasons to take this   losartan 25 MG tablet Commonly known as: COZAAR TAKE 1 TABLET BY MOUTH EVERY DAY   methocarbamol 500 MG tablet Commonly known as: ROBAXIN Take 1 tablet (500 mg total) by mouth every 8 (eight) hours as needed for muscle spasms.   nitroGLYCERIN 0.4 MG SL tablet Commonly known as: NITROSTAT PLACE ONE TABLET UNDER THE TONGUE EVERY FIVE MINUTES AS NEEDED FOR CHEST PAIN   omeprazole 20 MG tablet Commonly known as: PRILOSEC OTC Take 20 mg by mouth 2 (two) times daily.   Spiriva HandiHaler 18 MCG inhalation capsule Generic drug: tiotropium place 1 CAPSULE into inhaler AND INHALE DAILY   Vimpat 150 MG Tabs Generic drug: Lacosamide Take 300 mg by mouth 2 (two) times daily.   zonisamide 100 MG capsule Commonly known as: ZONEGRAN Take 200 mg by mouth at bedtime.         Objective:   BP 131/84   Pulse 62   Temp 98.2 F (36.8 C)   Ht _0  (1.676 m)   Wt 220 lb (99.8 kg)   SpO2 94%   BMI 35.51 kg/m   Wt Readings from Last 3 Encounters:  10/11/21 220 lb (99.8 kg)  08/24/21 230 lb (104.3 kg)  06/29/21 226 lb (102.5 kg)    Physical Exam Vitals and nursing note reviewed.  Constitutional:      General: She is not in acute distress.    Appearance: She is well-developed. She is not diaphoretic.  Eyes:     Conjunctiva/sclera: Conjunctivae normal.  Cardiovascular:     Rate and Rhythm: Normal rate and regular rhythm.     Heart sounds: Normal heart sounds. No murmur heard. Pulmonary:     Effort: Pulmonary effort is normal. No respiratory distress.     Breath sounds: Normal breath sounds. No wheezing.  Musculoskeletal:        General: No swelling or tenderness. Normal range of  motion.  Skin:    General: Skin is warm and dry.     Findings: No rash.  Neurological:     Mental Status: She is alert and oriented to person, place, and time.     Coordination: Coordination normal.  Psychiatric:        Behavior: Behavior normal.       Assessment & Plan:   Problem List Items Addressed This Visit       Cardiovascular and Mediastinum   Essential hypertension - Primary (Chronic)   Relevant Orders   CMP14+EGFR   Lipid panel   Coronary artery disease involving native coronary artery of native heart with angina pectoris (  HCC) (Chronic)     Respiratory   Pulmonary emphysema (HCC)   Relevant Medications   albuterol (VENTOLIN HFA) 108 (90 Base) MCG/ACT inhaler   tiotropium (SPIRIVA HANDIHALER) 18 MCG inhalation capsule     Nervous and Auditory   Seizure disorder (HCC)     Other   Hyperlipidemia with target LDL less than 70 -> CAD (Chronic)   Relevant Orders   CMP14+EGFR   Lipid panel   Other Visit Diagnoses     Primary osteoarthritis of left knee       Relevant Medications   diclofenac Sodium (VOLTAREN) 1 % GEL   Colon cancer screening       Relevant Orders   Cologuard       Continue current medicine, no changes.  We will do blood work today. Follow up plan: Return in about 6 months (around 04/12/2022), or if symptoms worsen or fail to improve, for Hypertension and hyperlipidemia CAD.  Counseling provided for all of the vaccine components Orders Placed This Encounter  Procedures   [place] Micheal Murad, MD [facility] 10/11/2021, 2:30 PM

## 2021-10-12 LAB — CMP14+EGFR
ALT: 17 IU/L (ref 0–32)
AST: 30 IU/L (ref 0–40)
Albumin/Globulin Ratio: 1.5 (ref 1.2–2.2)
Albumin: 4.1 g/dL (ref 3.9–4.9)
Alkaline Phosphatase: 140 IU/L — ABNORMAL HIGH (ref 44–121)
BUN/Creatinine Ratio: 18 (ref 12–28)
BUN: 17 mg/dL (ref 8–27)
Bilirubin Total: 0.3 mg/dL (ref 0.0–1.2)
CO2: 22 mmol/L (ref 20–29)
Calcium: 9.1 mg/dL (ref 8.7–10.3)
Chloride: 106 mmol/L (ref 96–106)
Creatinine, Ser: 0.92 mg/dL (ref 0.57–1.00)
Globulin, Total: 2.8 g/dL (ref 1.5–4.5)
Glucose: 159 mg/dL — ABNORMAL HIGH (ref 70–99)
Potassium: 4.3 mmol/L (ref 3.5–5.2)
Sodium: 139 mmol/L (ref 134–144)
Total Protein: 6.9 g/dL (ref 6.0–8.5)
eGFR: 69 mL/min/{1.73_m2} (ref >59)

## 2021-10-12 LAB — LIPID PANEL
Chol/HDL Ratio: 2.5 ratio (ref 0.0–4.4)
Cholesterol, Total: 133 mg/dL (ref 100–199)
HDL: 53 mg/dL (ref >39)
LDL Chol Calc (NIH): 58 mg/dL (ref 0–99)
Triglycerides: 126 mg/dL (ref 0–149)
VLDL Cholesterol Cal: 22 mg/dL (ref 5–40)

## 2021-11-07 ENCOUNTER — Telehealth: Payer: Self-pay | Admitting: *Deleted

## 2021-11-07 NOTE — Telephone Encounter (Signed)
Attempted Ortho bundle 90 day call to patient; no answer and left VM requesting call back.

## 2021-11-08 ENCOUNTER — Other Ambulatory Visit: Payer: Self-pay | Admitting: Cardiology

## 2021-11-16 ENCOUNTER — Other Ambulatory Visit: Payer: Self-pay

## 2021-11-16 ENCOUNTER — Emergency Department (HOSPITAL_COMMUNITY)
Admission: EM | Admit: 2021-11-16 | Discharge: 2021-11-17 | Disposition: A | Discharge status: Home / Self Care | Payer: Medicare Other | Attending: Emergency Medicine | Admitting: Emergency Medicine

## 2021-11-16 ENCOUNTER — Emergency Department (HOSPITAL_COMMUNITY): Payer: Medicare Other

## 2021-11-16 ENCOUNTER — Encounter (HOSPITAL_COMMUNITY): Payer: Self-pay

## 2021-11-16 DIAGNOSIS — M7989 Other specified soft tissue disorders: Secondary | ICD-10-CM | POA: Diagnosis not present

## 2021-11-16 DIAGNOSIS — J45909 Unspecified asthma, uncomplicated: Secondary | ICD-10-CM | POA: Diagnosis not present

## 2021-11-16 DIAGNOSIS — Z96653 Presence of artificial knee joint, bilateral: Secondary | ICD-10-CM | POA: Insufficient documentation

## 2021-11-16 DIAGNOSIS — I2510 Atherosclerotic heart disease of native coronary artery without angina pectoris: Secondary | ICD-10-CM | POA: Insufficient documentation

## 2021-11-16 DIAGNOSIS — D72829 Elevated white blood cell count, unspecified: Secondary | ICD-10-CM | POA: Insufficient documentation

## 2021-11-16 DIAGNOSIS — M79641 Pain in right hand: Secondary | ICD-10-CM | POA: Diagnosis not present

## 2021-11-16 DIAGNOSIS — I10 Essential (primary) hypertension: Secondary | ICD-10-CM | POA: Insufficient documentation

## 2021-11-16 DIAGNOSIS — Z87891 Personal history of nicotine dependence: Secondary | ICD-10-CM | POA: Insufficient documentation

## 2021-11-16 DIAGNOSIS — J449 Chronic obstructive pulmonary disease, unspecified: Secondary | ICD-10-CM | POA: Insufficient documentation

## 2021-11-16 LAB — COMPREHENSIVE METABOLIC PANEL
ALT: 16 U/L (ref 0–44)
AST: 26 U/L (ref 15–41)
Albumin: 3.4 g/dL — ABNORMAL LOW (ref 3.5–5.0)
Alkaline Phosphatase: 124 U/L (ref 38–126)
Anion gap: 8 (ref 5–15)
BUN: 8 mg/dL (ref 8–23)
CO2: 26 mmol/L (ref 22–32)
Calcium: 8.7 mg/dL — ABNORMAL LOW (ref 8.9–10.3)
Chloride: 106 mmol/L (ref 98–111)
Creatinine, Ser: 0.82 mg/dL (ref 0.44–1.00)
GFR, Estimated: >60 mL/min (ref >60)
Glucose, Bld: 126 mg/dL — ABNORMAL HIGH (ref 70–99)
Potassium: 3.4 mmol/L — ABNORMAL LOW (ref 3.5–5.1)
Sodium: 140 mmol/L (ref 135–145)
Total Bilirubin: 0.6 mg/dL (ref 0.3–1.2)
Total Protein: 6.9 g/dL (ref 6.5–8.1)

## 2021-11-16 LAB — CBC
HCT: 37.5 % (ref 36.0–46.0)
Hemoglobin: 12.3 g/dL (ref 12.0–15.0)
MCH: 31.7 pg (ref 26.0–34.0)
MCHC: 32.8 g/dL (ref 30.0–36.0)
MCV: 96.6 fL (ref 80.0–100.0)
Platelets: 216 10*3/uL (ref 150–400)
RBC: 3.88 MIL/uL (ref 3.87–5.11)
RDW: 14 % (ref 11.5–15.5)
WBC: 7.5 10*3/uL (ref 4.0–10.5)
nRBC: 0 % (ref 0.0–0.2)

## 2021-11-16 NOTE — ED Provider Notes (Signed)
[facility] Emergency Department Provider Note MRN:  [id number]  Arrival date & time: 11/17/21     Chief Complaint   Hand Pain   History of Present Illness   Courtney Mcconnell is a 66 y.o. year-old female with a history of CAD, COPD presenting to the ED with chief complaint of hand pain.  Pain surrounding the first MCP of the right hand for the past week, slowly getting worse, becoming swollen and red and more more painful.  Denies fever, becoming difficult to use the hand.  No recent trauma to the hand or cuts or scrapes.  Review of Systems  A thorough review of systems was obtained and all systems are negative except as noted in the HPI and PMH.   Patient's Health History    Past Medical History:  Diagnosis Date   Asthma    Bursitis    Hip   CAD (coronary artery disease) 02/11/2019   PCI/DES x1 to mLAD, focal ostial 70% lesion in 1st diag   COPD (chronic obstructive pulmonary disease) (HCC)    Essential hypertension    GERD (gastroesophageal reflux disease)    Headache(784.0)    Hyperlipidemia    NSTEMI (non-ST elevated myocardial infarction) ([place]) 02/11/2019   Pneumonia    Seizure ([place])    Sleep apnea     Past Surgical History:  Procedure Laterality Date   ABDOMINAL HYSTERECTOMY     APPENDECTOMY     CATARACT EXTRACTION Bilateral    CHOLECYSTECTOMY     CORONARY STENT INTERVENTION N/A 02/11/2019   Procedure: CORONARY STENT INTERVENTION;  Surgeon: Burnell Blanks, MD;  Location: [facility];; mid LAD 70% -- DES PCI (Synergy DES 2.5 x 28 --2.8 mm)   heal spur     right   INTRAVASCULAR PRESSURE WIRE/FFR STUDY N/A 02/11/2019   Procedure: INTRAVASCULAR PRESSURE WIRE/FFR STUDY;  Surgeon: Burnell Blanks, MD;  Location: [place] [facility];  Service: Cardiovascular;  Laterality: N/A;   LASER PHOTO ABLATION Right 08/27/2013   Procedure: LASER PHOTO ABLATION;  Surgeon: Hayden Pedro, MD;  Location: [place];  Service: Ophthalmology;   Laterality: Right;  Headscope laser AND Endolaser   LEFT HEART CATH AND CORONARY ANGIOGRAPHY N/A 02/11/2019   Procedure: LEFT HEART CATH AND CORONARY ANGIOGRAPHY;  Surgeon: Burnell Blanks, MD;  Location: [place] INVASIVE CV LAB;;; prox RCA 20%. Ost-prox Cx 20%. ostial D1 70% & mLAD 70% => (DES PCI of LAD)   MEMBRANE PEEL Right 08/27/2013   Procedure: MEMBRANE PEEL;  Surgeon: Hayden Pedro, MD;  Location: [place];  Service: Ophthalmology;  Laterality: Right;   PARS PLANA VITRECTOMY Right 08/27/2013   Procedure: PARS PLANA VITRECTOMY WITH 25 GAUGE;  Surgeon: Hayden Pedro, MD;  Location: [place];  Service: Ophthalmology;  Laterality: Right;   TOTAL KNEE ARTHROPLASTY Right 01/09/2021   Procedure: RIGHT TOTAL KNEE ARTHROPLASTY;  Surgeon: [name], MD;  Location: [place];  Service: Orthopedics;  Laterality: Right;  Needs RNFA   TOTAL KNEE ARTHROPLASTY Left 06/16/2021   Procedure: LEFT TOTAL KNEE ARTHROPLASTY;  Surgeon: [name], MD;  Location: [place];  Service: Orthopedics;  Laterality: Left;    Family History  Problem Relation Age of Onset   COPD Mother    Diabetes Father    Hypertension Father    Hyperlipidemia Father    Cancer Sister        Bone Cancer   Diabetes Brother    Cancer Brother  Cancer Paternal Uncle    Stroke Paternal Grandmother    Healthy Daughter    Healthy Son    Colon cancer Neg Hx     Social History   Socioeconomic History   Marital status: Widowed    Spouse name: [name]   Number of children: 2   Years of education: GED   Highest education level: Not on file  Occupational History   Occupation: COOK    Employer: [employer]  Tobacco Use   Smoking status: Former    Packs/day: 0.25    Years: 42.00    Total pack years: 10.50    Types: Cigarettes    Quit date: 03/20/2014    Years since quitting: 7.6   Smokeless tobacco: Never   Tobacco comments:    01/21/15 restarted smoking 4 mos ago  Vaping Use   Vaping Use: Never used  Substance and  Sexual Activity   Alcohol use: No    Alcohol/week: 0.0 standard drinks of alcohol   Drug use: No   Sexual activity: Not on file  Other Topics Concern   Not on file  Social History Narrative   Patient lives at home with family.   Caffeine Use: 1 pot and a half a day   Social Determinants of Health   Financial Resource Strain: Low Risk  (02/08/2021)   Overall Financial Resource Strain (CARDIA)    Difficulty of Paying Living Expenses: Not very hard  Food Insecurity: No Food Insecurity (02/08/2021)   Hunger Vital Sign    Worried About Running Out of Food in the Last Year: Never true    Ran Out of Food in the Last Year: Never true  Transportation Needs: Unknown (02/08/2021)   PRAPARE - Hydrologist (Medical): Not on file    Lack of Transportation (Non-Medical): No  Physical Activity: Inactive (02/08/2021)   Exercise Vital Sign    Days of Exercise per Week: 0 days    Minutes of Exercise per Session: 0 min  Stress: No Stress Concern Present (02/08/2021)   [place]    Feeling of Stress : Only a little  Social Connections: Moderately Isolated (02/08/2021)   Social Connection and Isolation Panel [NHANES]    Frequency of Communication with Friends and Family: More than three times a week    Frequency of Social Gatherings with Friends and Family: Three times a week    Attends Religious Services: 1 to 4 times per year    Active Member of Clubs or Organizations: No    Attends Archivist Meetings: Never    Marital Status: Widowed  Intimate Partner Violence: Not At Risk (02/08/2021)   Humiliation, Afraid, Rape, and Kick questionnaire    Fear of Current or Ex-Partner: No    Emotionally Abused: No    Physically Abused: No    Sexually Abused: No     Physical Exam   Vitals:   11/16/21 2200 11/17/21 0024  BP: (!) 176/71 (!) 176/83  Pulse: (!) 56 61  Resp: 17 18  Temp:  97.6 F  (36.4 C)  SpO2: 98% 100%    CONSTITUTIONAL: Well-appearing, NAD NEURO/PSYCH:  Alert and oriented x 3, no focal deficits EYES:  eyes equal and reactive ENT/NECK:  no LAD, no JVD CARDIO: Regular rate, well-perfused, normal S1 and S2 PULM:  CTAB no wheezing or rhonchi GI/GU:  non-distended, non-tender MSK/SPINE:  No gross deformities SKIN: Edema and erythema surrounding the first  MCP of the right hand, fingers neurovascularly intact   *Additional and/or pertinent findings included in MDM below  Diagnostic and Interventional Summary    EKG Interpretation  Date/Time:    Ventricular Rate:    PR Interval:    QRS Duration:   QT Interval:    QTC Calculation:   R Axis:     Text Interpretation:         Labs Reviewed  COMPREHENSIVE METABOLIC PANEL - Abnormal; Notable for the following components:      Result Value   Potassium 3.4 (*)    Glucose, Bld 126 (*)    Calcium 8.7 (*)    Albumin 3.4 (*)    All other components within normal limits  SEDIMENTATION RATE - Abnormal; Notable for the following components:   Sed Rate 55 (*)    All other components within normal limits  CBC  URIC ACID  C-REACTIVE PROTEIN    DG Hand Complete Right  Final Result      Medications  cephALEXin (KEFLEX) capsule 500 mg (has no administration in time range)  predniSONE (DELTASONE) tablet 60 mg (has no administration in time range)     Procedures  /  Critical Care Ultrasound ED Soft Tissue  Date/Time: 11/17/2021 12:56 AM  Performed by: Maudie Flakes, MD Authorized by: Maudie Flakes, MD   Procedure details:    Indications: limb pain and evaluate for cellulitis     Transverse view:  Visualized   Longitudinal view:  Visualized   Images: archived   Location:    Location comment:  Right first MCP Comments:     Limited MSK ultrasound performed to evaluate for signs of joint effusion.  Trace fluid surrounding the MCP joint.   ED Course and Medical Decision Making  Initial Impression  and Ddx Differential diagnosis includes inflammatory arthritis such as gout versus septic arthritis which seems less likely given lack of fever.  External cellulitis also a consideration but the pain and pattern of the erythema is more suspicious for synovial source of either inflammation or infection.  Will discuss case with hand surgery.  Past medical/surgical history that increases complexity of ED encounter: None  Interpretation of Diagnostics I personally reviewed the hand x-ray and my interpretation is as follows: No obvious bony abnormality    Patient Reassessment and Ultimate Disposition/Management     Case discussed with Dr. Fredna Dow of hand surgery, recommending uric acid to see if this supports the notion of gout.  Arthritis is favored given the duration of illness and the lack of leukocytosis.  Uric acid is negative and so we will also cover for infection with Keflex.  Patient is made aware of the possibility of septic joint and will return if symptoms or not improving.  Patient management required discussion with the following services or consulting groups:  Hand Surgery  Complexity of Problems Addressed Acute illness or injury that poses threat of life of bodily function  Additional Data Reviewed and Analyzed Further history obtained from: Further history from spouse/family member  Additional Factors Impacting ED Encounter Risk Prescriptions  Barth Kirks. Sedonia Small, [place] [email or web address]  Final Clinical Impressions(s) / ED Diagnoses     ICD-10-CM   1. Pain of right hand  M79.641       ED Discharge Orders          Ordered    cephALEXin (KEFLEX) 500 MG capsule  3 times daily,   Status:  Discontinued  11/17/21 0053    predniSONE (DELTASONE) 20 MG tablet  Daily        11/17/21 0053    cephALEXin (KEFLEX) 500 MG capsule  4 times daily        11/17/21 0054             Discharge Instructions Discussed  with and Provided to Patient:     Discharge Instructions      You were evaluated in the Emergency Department and after careful evaluation, we did not find any emergent condition requiring admission or further testing in the hospital.  Your symptoms may be due to inflammation in the joint.  We are also considering infection in the joint but we feel this is less likely.  Take the prednisone steroid medication as directed to help with the redness and swelling.  This should treat the arthritis.  Take the Keflex antibiotic as directed to cover for infection.  Recommend calling the office for Dr. Fredna Dow for follow-up.  Please return to the Emergency Department if your symptoms are not improving over the next 3 to 5 days.  Thank you for allowing [place] to be a part of your care.        Maudie Flakes, MD 11/17/21 [phone]

## 2021-11-16 NOTE — ED Notes (Signed)
Pt ambulatory to room 1

## 2021-11-16 NOTE — ED Notes (Signed)
Tiffany with lab made aware that pt still needs blood drawn

## 2021-11-16 NOTE — ED Triage Notes (Signed)
Pt c/o redness & swelling to right hand that started a week ago. Pt reports she has been putting ice pack on hand but not getting better.Pt denies any injury.

## 2021-11-17 ENCOUNTER — Other Ambulatory Visit: Payer: Self-pay | Admitting: Cardiology

## 2021-11-17 DIAGNOSIS — M79641 Pain in right hand: Secondary | ICD-10-CM | POA: Diagnosis not present

## 2021-11-17 LAB — C-REACTIVE PROTEIN: CRP: 2.5 mg/dL — ABNORMAL HIGH (ref <1.0)

## 2021-11-17 LAB — URIC ACID: Uric Acid, Serum: 5.2 mg/dL (ref 2.5–7.1)

## 2021-11-17 LAB — SEDIMENTATION RATE: Sed Rate: 55 mm/hr — ABNORMAL HIGH (ref 0–22)

## 2021-11-17 MED ORDER — CEPHALEXIN 500 MG PO CAPS: 500.0000 mg | ORAL_CAPSULE | Freq: Three times a day (TID) | ORAL | 0 refills | Status: DC

## 2021-11-17 MED ORDER — CEPHALEXIN 500 MG PO CAPS
500.0000 mg | ORAL_CAPSULE | Freq: Once | ORAL | Status: AC
  Administered 2021-11-17: 500 mg via ORAL
  Filled 2021-11-17: qty 1

## 2021-11-17 MED ORDER — PREDNISONE 10 MG PO TABS
60.0000 mg | ORAL_TABLET | Freq: Once | ORAL | Status: AC
  Administered 2021-11-17: 60 mg via ORAL
  Filled 2021-11-17: qty 1

## 2021-11-17 MED ORDER — PREDNISONE 20 MG PO TABS: 40.0000 mg | ORAL_TABLET | Freq: Every day | ORAL | 0 refills | Status: AC

## 2021-11-17 MED ORDER — CEPHALEXIN 500 MG PO CAPS: 500.0000 mg | ORAL_CAPSULE | Freq: Four times a day (QID) | ORAL | 0 refills | Status: AC

## 2021-11-17 NOTE — Discharge Instructions (Signed)
You were evaluated in the Emergency Department and after careful evaluation, we did not find any emergent condition requiring admission or further testing in the hospital.  Your symptoms may be due to inflammation in the joint.  We are also considering infection in the joint but we feel this is less likely.  Take the prednisone steroid medication as directed to help with the redness and swelling.  This should treat the arthritis.  Take the Keflex antibiotic as directed to cover for infection.  Recommend calling the office for Dr. Fredna Dow for follow-up.  Please return to the Emergency Department if your symptoms are not improving over the next 3 to 5 days.  Thank you for allowing [place] to be a part of your care.

## 2021-11-22 ENCOUNTER — Telehealth: Payer: Self-pay

## 2021-11-22 NOTE — Telephone Encounter (Signed)
        Patient  visited [facility] on 10/6    Telephone encounter attempt :  1st  A HIPAA compliant voice message was left requesting a return call.  Instructed patient to call back    Courtney Mcconnell [facility] Guide, [place], Care Management  [phone] [address], [place], [place] [address] Phone: [phone] Email: [email or web address]       

## 2021-12-06 ENCOUNTER — Ambulatory Visit (INDEPENDENT_AMBULATORY_CARE_PROVIDER_SITE_OTHER): Payer: Medicare Other | Admitting: Family Medicine

## 2021-12-06 ENCOUNTER — Encounter: Payer: Self-pay | Admitting: Family Medicine

## 2021-12-06 VITALS — BP 132/72 | HR 60 | Temp 98.0°F | Ht 66.0 in | Wt 225.0 lb

## 2021-12-06 DIAGNOSIS — M25532 Pain in left wrist: Secondary | ICD-10-CM

## 2021-12-06 DIAGNOSIS — M79641 Pain in right hand: Secondary | ICD-10-CM | POA: Diagnosis not present

## 2021-12-06 MED ORDER — FLUTICASONE-SALMETEROL 100-50 MCG/ACT IN AEPB: 1.0000 | INHALATION_SPRAY | Freq: Two times a day (BID) | RESPIRATORY_TRACT | 3 refills | Status: DC

## 2021-12-06 MED ORDER — METHYLPREDNISOLONE ACETATE 80 MG/ML IJ SUSP: 80.0000 mg | Freq: Once | INTRAMUSCULAR | Status: DC

## 2021-12-06 MED ORDER — METHYLPREDNISOLONE ACETATE 40 MG/ML IJ SUSP
40.0000 mg | Freq: Once | INTRAMUSCULAR | Status: AC
  Administered 2021-12-06: 80 mg via INTRAMUSCULAR

## 2021-12-06 NOTE — Addendum Note (Signed)
Addended by: Alphonzo Dublin on: 12/06/2021 04:00 PM   Modules accepted: Orders

## 2021-12-06 NOTE — Progress Notes (Signed)
BP 132/72   Pulse 60   Temp 98 F (36.7 C)   Ht '5\' 6"'$  (1.676 m)   Wt 225 lb (102.1 kg)   SpO2 96%   BMI 36.32 kg/m    Subjective:   Patient ID: Courtney Mcconnell, female    DOB: February 16, 1955, 66 y.o.   MRN: [id number]  HPI: Courtney Mcconnell is a 66 y.o. female presenting on 12/06/2021 for Hand Pain (Bilateral. Started about 2 weeks ago. Swelling and pain. Went to [facility]. Had )   HPI Bilateral hand pain Patient is coming in for bilateral hand pain/ER follow-up.  She was in the ER about 2-1/2 weeks ago for evaluation and said that her right hand had a lot more swelling and redness especially in the MCP joints of the second and third digits.  She says that is calm down but now she has a lot of pain in the wrist on both sides.  She has been wearing some gloves to help with it.  She was told that it possibly looked like gout versus infection and was given an antibiotic and it has improved.  She has not been taking any anti-inflammatories.  She denies any fevers or chills but does have inflammation in the hands.  She denies any loss of strength or grip  Relevant past medical, surgical, family and social history reviewed and updated as indicated. Interim medical history since our last visit reviewed. Allergies and medications reviewed and updated.  Review of Systems  Constitutional:  Negative for chills and fever.  Eyes:  Negative for visual disturbance.  Respiratory:  Negative for chest tightness and shortness of breath.   Cardiovascular:  Negative for chest pain and leg swelling.  Musculoskeletal:  Positive for arthralgias and joint swelling. Negative for back pain, gait problem and myalgias.  Skin:  Negative for rash.  Neurological:  Negative for light-headedness and headaches.  Psychiatric/Behavioral:  Negative for agitation and behavioral problems.   All other systems reviewed and are negative.   Per HPI unless specifically indicated above   Allergies as of 12/06/2021       Reactions    Penicillins Hives   Bee Venom Swelling   Prednisone Other (See Comments)   Patient did not like how it made her feel, caused jittery feeling.    Augmentin [amoxicillin-pot Clavulanate] Rash   Doxycycline Hyclate Itching, Rash        Medication List        Accurate as of December 06, 2021  3:39 PM. If you have any questions, ask your nurse or doctor.          albuterol 108 (90 Base) MCG/ACT inhaler Commonly known as: Ventolin HFA Inhale 2 puffs into the lungs every 6 (six) hours as needed for wheezing or shortness of breath. INHALE TWO PUFFS INTO THE LUNGS EVERY 6 HOURS AS NEEDED FOR WHEEZING OR SHORTNESS OF BREATH   atorvastatin 40 MG tablet Commonly known as: LIPITOR TAKE 1 TABLET BY MOUTH DAILY AT 6PM   carvedilol 3.125 MG tablet Commonly known as: COREG TAKE 1 TABLET BY MOUTH TWICE DAILY WITH A MEAL   cetirizine 10 MG tablet Commonly known as: ZyrTEC Allergy Take 1 tablet (10 mg total) by mouth daily for 14 days.   diclofenac Sodium 1 % Gel Commonly known as: Voltaren Apply 2 g topically 4 (four) times daily.   fluticasone-salmeterol 100-50 MCG/ACT Aepb Commonly known as: ADVAIR Inhale 1 puff into the lungs 2 (two) times daily. What changed:  medication strength See the new instructions. Changed by: Fransisca Kaufmann Brinleigh Tew, MD   losartan 25 MG tablet Commonly known as: COZAAR TAKE 1 TABLET BY MOUTH EVERY DAY   methocarbamol 500 MG tablet Commonly known as: ROBAXIN Take 1 tablet (500 mg total) by mouth every 8 (eight) hours as needed for muscle spasms.   nitroGLYCERIN 0.4 MG SL tablet Commonly known as: NITROSTAT PLACE ONE TABLET UNDER THE TONGUE EVERY FIVE MINUTES AS NEEDED FOR CHEST PAIN   omeprazole 20 MG tablet Commonly known as: PRILOSEC OTC Take 20 mg by mouth 2 (two) times daily.   Spiriva HandiHaler 18 MCG inhalation capsule Generic drug: tiotropium place 1 CAPSULE into inhaler AND INHALE DAILY   Vimpat 150 MG Tabs Generic drug:  Lacosamide Take 300 mg by mouth 2 (two) times daily.   zonisamide 100 MG capsule Commonly known as: ZONEGRAN Take 200 mg by mouth at bedtime.         Objective:   BP 132/72   Pulse 60   Temp 98 F (36.7 C)   Ht '5\' 6"'$  (1.676 m)   Wt 225 lb (102.1 kg)   SpO2 96%   BMI 36.32 kg/m   Wt Readings from Last 3 Encounters:  12/06/21 225 lb (102.1 kg)  11/16/21 220 lb (99.8 kg)  10/11/21 220 lb (99.8 kg)    Physical Exam Vitals and nursing note reviewed.  Constitutional:      General: She is not in acute distress.    Appearance: She is well-developed. She is not diaphoretic.  Eyes:     Conjunctiva/sclera: Conjunctivae normal.  Musculoskeletal:     Right hand: Swelling and tenderness present. No bony tenderness. Decreased range of motion. Normal strength. Normal sensation. There is no disruption of two-point discrimination. Normal capillary refill.     Left hand: Swelling and tenderness present. No bony tenderness. Decreased range of motion. Normal strength. Normal sensation. There is no disruption of two-point discrimination. Normal capillary refill.  Skin:    General: Skin is warm and dry.     Findings: No bruising, erythema or rash.  Neurological:     Mental Status: She is alert and oriented to person, place, and time.     Coordination: Coordination normal.  Psychiatric:        Behavior: Behavior normal.       Assessment & Plan:   Problem List Items Addressed This Visit   None Visit Diagnoses     Right hand pain    -  Primary   Relevant Medications   methylPREDNISolone acetate (DEPO-MEDROL) injection 80 mg (Start on 12/06/2021  3:45 PM)   Left wrist pain       Relevant Medications   methylPREDNISolone acetate (DEPO-MEDROL) injection 80 mg (Start on 12/06/2021  3:45 PM)       Will give steroid for injection.  To help with inflammation.  Likely gout, if not improved will send to orthopedic Follow up plan: Return if symptoms worsen or fail to  improve.  Counseling provided for all of the vaccine components No orders of the defined types were placed in this encounter.   Caryl Pina, MD [facility] 12/06/2021, 3:39 PM

## 2021-12-12 ENCOUNTER — Other Ambulatory Visit: Payer: Self-pay | Admitting: Cardiology

## 2022-01-08 ENCOUNTER — Other Ambulatory Visit: Payer: Self-pay | Admitting: Cardiology

## 2022-01-15 ENCOUNTER — Ambulatory Visit: Payer: Medicare Other | Admitting: Nurse Practitioner

## 2022-01-18 ENCOUNTER — Other Ambulatory Visit: Payer: Self-pay | Admitting: Family Medicine

## 2022-02-11 ENCOUNTER — Other Ambulatory Visit: Payer: Self-pay | Admitting: Cardiology

## 2022-02-13 ENCOUNTER — Ambulatory Visit (INDEPENDENT_AMBULATORY_CARE_PROVIDER_SITE_OTHER): Payer: Medicare Other | Admitting: Nurse Practitioner

## 2022-02-13 ENCOUNTER — Encounter: Payer: Self-pay | Admitting: Nurse Practitioner

## 2022-02-13 VITALS — BP 168/79 | HR 66 | Temp 98.8°F | Ht 66.0 in | Wt 229.0 lb

## 2022-02-13 DIAGNOSIS — L280 Lichen simplex chronicus: Secondary | ICD-10-CM | POA: Diagnosis not present

## 2022-02-13 MED ORDER — MUPIROCIN 2 % EX OINT: 1.0000 | TOPICAL_OINTMENT | Freq: Two times a day (BID) | CUTANEOUS | 0 refills | Status: DC

## 2022-02-13 MED ORDER — CLINDAMYCIN HCL 300 MG PO CAPS: 300.0000 mg | ORAL_CAPSULE | Freq: Three times a day (TID) | ORAL | 0 refills | Status: DC

## 2022-02-13 NOTE — Progress Notes (Signed)
Acute Office Visit  Subjective:     Patient ID: Courtney Mcconnell, female    DOB: 06-15-1955, 67 y.o.   MRN: [id number]  Chief Complaint  Patient presents with   Rash    Under left breast     Rash This is a new problem. The current episode started in the past 7 days. The problem has been rapidly worsening since onset. Location: under left breast. The rash is characterized by dryness, redness and itchiness. It is unknown if there was an exposure to a precipitant. Pertinent negatives include no congestion, cough, fever, shortness of breath or sore throat. Past treatments include topical steroids. The treatment provided no relief.     Review of Systems  Constitutional: Negative.  Negative for chills and fever.  HENT: Negative.  Negative for congestion and sore throat.   Eyes: Negative.   Respiratory:  Negative for cough and shortness of breath.   Cardiovascular: Negative.   Genitourinary: Negative.   Skin:  Positive for itching and rash.  All other systems reviewed and are negative.       Objective:    BP (!) 168/79   Pulse 66   Temp 98.8 F (37.1 C)   Ht '5\' 6"'$  (1.676 m)   Wt 229 lb (103.9 kg)   SpO2 97%   BMI 36.96 kg/m    Physical Exam Vitals and nursing note reviewed.  Constitutional:      Appearance: Normal appearance.  HENT:     Right Ear: External ear normal.     Left Ear: External ear normal.     Nose: Nose normal. No congestion.     Mouth/Throat:     Mouth: Mucous membranes are moist.  Cardiovascular:     Rate and Rhythm: Normal rate and regular rhythm.     Pulses: Normal pulses.     Heart sounds: Normal heart sounds.  Pulmonary:     Effort: Pulmonary effort is normal.     Breath sounds: Normal breath sounds.  Skin:    General: Skin is warm.     Findings: Erythema and rash present.          Comments: Dry, itch raised rash  Neurological:     General: No focal deficit present.     Mental Status: She is alert and oriented to person, place, and  time.     No results found for any visits on 02/13/22.      Assessment & Plan:  Patient presents with left sided under breast rash for the past few weeks, patient has applied OTC topical steroids with no therapeutic effect. After assessment , rash does not appear to be shingles. Started patient on cleocin as the rash now looks like lichen simplex but clearly infected with rigorous scratching.   Problem List Items Addressed This Visit   None Visit Diagnoses     Lichen simplex chronicus    -  Primary   Relevant Medications   clindamycin (CLEOCIN) 300 MG capsule   mupirocin ointment (BACTROBAN) 2 %       Meds ordered this encounter  Medications   clindamycin (CLEOCIN) 300 MG capsule    Sig: Take 1 capsule (300 mg total) by mouth 3 (three) times daily.    Dispense:  21 capsule    Refill:  0    Order Specific Question:   Supervising Provider    Answer:   Claretta Fraise [[id number]]   mupirocin ointment (BACTROBAN) 2 %    Sig: Apply 1 Application  topically 2 (two) times daily.    Dispense:  10 g    Refill:  0    Order Specific Question:   Supervising Provider    Answer:   Claretta Fraise [[id number]]    Return if symptoms worsen or fail to improve, for rash.  Ivy Lynn, NP

## 2022-02-13 NOTE — Patient Instructions (Signed)
Rash, Adult A rash is a change in the color of your skin. A rash can also change the way your skin feels. There are many different conditions and factors that can cause a rash. Some rashes may disappear after a few days, but some may last for a few weeks. Common causes of rashes include: Viral infections, such as: Colds. Measles. Hand, foot, and mouth disease. Bacterial infections, such as: Scarlet fever. Impetigo. Fungal infections, such as Candida. Allergic reactions to food, medicines, or skin care products. Follow these instructions at home: The goal of treatment is to stop the itching and keep the rash from spreading. Pay attention to any changes in your symptoms. Follow these instructions to help with your condition: Medicine Take or apply over-the-counter and prescription medicines only as told by your health care provider. These may include: Corticosteroid creams to treat red or swollen skin. Anti-itch lotions. Oral allergy medicines (antihistamines). Oral corticosteroids for severe symptoms.  Skin care Apply cool compresses to the affected areas. Do not scratch or rub your skin. Avoid covering the rash. Make sure the rash is exposed to air as much as possible. Managing itching and discomfort Avoid hot showers or baths, which can make itching worse. A cold shower may help. Try taking a bath with: Epsom salts. Follow manufacturer instructions on the packaging. You can get these at your local pharmacy or grocery store. Baking soda. Pour a small amount into the bath as told by your health care provider. Colloidal oatmeal. Follow manufacturer instructions on the packaging. You can get this at your local pharmacy or grocery store. Try applying baking soda paste to your skin. Stir water into baking soda until it reaches a paste-like consistency. Try applying calamine lotion. This is an over-the-counter lotion that helps to relieve itchiness. Keep cool and out of the sun. Sweating  and being hot can make itching worse. General instructions  Rest as needed. Drink enough fluid to keep your urine pale yellow. Wear loose-fitting clothing. Avoid scented soaps, detergents, and perfumes. Use gentle soaps, detergents, perfumes, and other cosmetic products. Avoid any substance that causes your rash. Keep a journal to help track what causes your rash. Write down: What you eat. What cosmetic products you use. What you drink. What you wear. This includes jewelry. Keep all follow-up visits as told by your health care provider. This is important. Contact a health care provider if: You sweat at night. You lose weight. You urinate more than normal. You urinate less than normal, or you notice that your urine is a darker color than usual. You feel weak. You vomit. Your skin or the whites of your eyes look yellow (jaundice). Your skin: Tingles. Is numb. Your rash: Does not go away after several days. Gets worse. You are: Unusually thirsty. More tired than normal. You have: New symptoms. Pain in your abdomen. A fever. Diarrhea. Get help right away if you: Have a fever and your symptoms suddenly get worse. Develop confusion. Have a severe headache or a stiff neck. Have severe joint pains or stiffness. Have a seizure. Develop a rash that covers all or most of your body. The rash may or may not be painful. Develop blisters that: Are on top of the rash. Grow larger or grow together. Are painful. Are inside your nose or mouth. Develop a rash that: Looks like purple pinprick-sized spots all over your body. Has a "bull's eye" or looks like a target. Is not related to sun exposure, is red and painful, and causes   your skin to peel. Summary A rash is a change in the color of your skin. Some rashes disappear after a few days, but some may last for a few weeks. The goal of treatment is to stop the itching and keep the rash from spreading. Take or apply over-the-counter  and prescription medicines only as told by your health care provider. Contact a health care provider if you have new or worsening symptoms. Keep all follow-up visits as told by your health care provider. This is important. This information is not intended to replace advice given to you by your health care provider. Make sure you discuss any questions you have with your health care provider. Document Revised: 08/01/2021 Document Reviewed: 11/10/2020 Elsevier Patient Education  2023 [employer]  

## 2022-02-14 ENCOUNTER — Ambulatory Visit (INDEPENDENT_AMBULATORY_CARE_PROVIDER_SITE_OTHER): Payer: Medicare Other

## 2022-02-14 VITALS — Ht 66.0 in | Wt 229.0 lb

## 2022-02-14 DIAGNOSIS — Z0000 Encounter for general adult medical examination without abnormal findings: Secondary | ICD-10-CM

## 2022-02-14 NOTE — Patient Instructions (Addendum)
Courtney Mcconnell , Thank you for taking time to come for your Medicare Wellness Visit. I appreciate your ongoing commitment to your health goals. Please review the following plan we discussed and let me know if I can assist you in the future.   These are the goals we discussed:  Goals      Weight (lb) < 200 lb (90.7 kg)     Goal is to lose 20 lbs        This is a list of the screening recommended for you and due dates:  Health Maintenance  Topic Date Due   COVID-19 Vaccine (1) 03/02/2022*   Flu Shot  05/13/2022*   Zoster (Shingles) Vaccine (1 of 2) 05/16/2022*   Pneumonia Vaccine (1 - PCV) 10/12/2022*   Colon Cancer Screening  10/12/2022*   Mammogram  02/15/2023*   Medicare Annual Wellness Visit  02/15/2023   DTaP/Tdap/Td vaccine (3 - Td or Tdap) 08/11/2024   DEXA scan (bone density measurement)  Completed   Hepatitis C Screening: USPSTF Recommendation to screen - Ages 49-79 yo.  Completed   HPV Vaccine  Aged Out  *Topic was postponed. The date shown is not the original due date.    Advanced directives: We have a copy of your advanced directives available in your record should your provider ever need to access them.   Conditions/risks identified: Aim for 30 minutes of exercise or brisk walking, 6-8 glasses of water, and 5 servings of fruits and vegetables each day.   Next appointment: Follow up in one year for your annual wellness visit    Preventive Care 65 Years and Older, Female Preventive care refers to lifestyle choices and visits with your health care provider that can promote health and wellness. What does preventive care include? A yearly physical exam. This is also called an annual well check. Dental exams once or twice a year. Routine eye exams. Ask your health care provider how often you should have your eyes checked. Personal lifestyle choices, including: Daily care of your teeth and gums. Regular physical activity. Eating a healthy diet. Avoiding tobacco and drug  use. Limiting alcohol use. Practicing safe sex. Taking low-dose aspirin every day. Taking vitamin and mineral supplements as recommended by your health care provider. What happens during an annual well check? The services and screenings done by your health care provider during your annual well check will depend on your age, overall health, lifestyle risk factors, and family history of disease. Counseling  Your health care provider may ask you questions about your: Alcohol use. Tobacco use. Drug use. Emotional well-being. Home and relationship well-being. Sexual activity. Eating habits. History of falls. Memory and ability to understand (cognition). Work and work Statistician. Reproductive health. Screening  You may have the following tests or measurements: Height, weight, and BMI. Blood pressure. Lipid and cholesterol levels. These may be checked every 5 years, or more frequently if you are over 56 years old. Skin check. Lung cancer screening. You may have this screening every year starting at age 79 if you have a 30-pack-year history of smoking and currently smoke or have quit within the past 15 years. Fecal occult blood test (FOBT) of the stool. You may have this test every year starting at age 18. Flexible sigmoidoscopy or colonoscopy. You may have a sigmoidoscopy every 5 years or a colonoscopy every 10 years starting at age 70. Hepatitis C blood test. Hepatitis B blood test. Sexually transmitted disease (STD) testing. Diabetes screening. This is done by checking your  blood sugar (glucose) after you have not eaten for a while (fasting). You may have this done every 1-3 years. Bone density scan. This is done to screen for osteoporosis. You may have this done starting at age 35. Mammogram. This may be done every 1-2 years. Talk to your health care provider about how often you should have regular mammograms. Talk with your health care provider about your test results, treatment  options, and if necessary, the need for more tests. Vaccines  Your health care provider may recommend certain vaccines, such as: Influenza vaccine. This is recommended every year. Tetanus, diphtheria, and acellular pertussis (Tdap, Td) vaccine. You may need a Td booster every 10 years. Zoster vaccine. You may need this after age 4. Pneumococcal 13-valent conjugate (PCV13) vaccine. One dose is recommended after age 30. Pneumococcal polysaccharide (PPSV23) vaccine. One dose is recommended after age 52. Talk to your health care provider about which screenings and vaccines you need and how often you need them. This information is not intended to replace advice given to you by your health care provider. Make sure you discuss any questions you have with your health care provider. Document Released: 02/25/2015 Document Revised: 10/19/2015 Document Reviewed: 11/30/2014 Elsevier Interactive Patient Education  2017 [place] Prevention in the Home Falls can cause injuries. They can happen to people of all ages. There are many things you can do to make your home safe and to help prevent falls. What can I do on the outside of my home? Regularly fix the edges of walkways and driveways and fix any cracks. Remove anything that might make you trip as you walk through a door, such as a raised step or threshold. Trim any bushes or trees on the path to your home. Use bright outdoor lighting. Clear any walking paths of anything that might make someone trip, such as rocks or tools. Regularly check to see if handrails are loose or broken. Make sure that both sides of any steps have handrails. Any raised decks and porches should have guardrails on the edges. Have any leaves, snow, or ice cleared regularly. Use sand or salt on walking paths during winter. Clean up any spills in your garage right away. This includes oil or grease spills. What can I do in the bathroom? Use night lights. Install grab  bars by the toilet and in the tub and shower. Do not use towel bars as grab bars. Use non-skid mats or decals in the tub or shower. If you need to sit down in the shower, use a plastic, non-slip stool. Keep the floor dry. Clean up any water that spills on the floor as soon as it happens. Remove soap buildup in the tub or shower regularly. Attach bath mats securely with double-sided non-slip rug tape. Do not have throw rugs and other things on the floor that can make you trip. What can I do in the bedroom? Use night lights. Make sure that you have a light by your bed that is easy to reach. Do not use any sheets or blankets that are too big for your bed. They should not hang down onto the floor. Have a firm chair that has side arms. You can use this for support while you get dressed. Do not have throw rugs and other things on the floor that can make you trip. What can I do in the kitchen? Clean up any spills right away. Avoid walking on wet floors. Keep items that you use a lot in  easy-to-reach places. If you need to reach something above you, use a strong step stool that has a grab bar. Keep electrical cords out of the way. Do not use floor polish or wax that makes floors slippery. If you must use wax, use non-skid floor wax. Do not have throw rugs and other things on the floor that can make you trip. What can I do with my stairs? Do not leave any items on the stairs. Make sure that there are handrails on both sides of the stairs and use them. Fix handrails that are broken or loose. Make sure that handrails are as long as the stairways. Check any carpeting to make sure that it is firmly attached to the stairs. Fix any carpet that is loose or worn. Avoid having throw rugs at the top or bottom of the stairs. If you do have throw rugs, attach them to the floor with carpet tape. Make sure that you have a light switch at the top of the stairs and the bottom of the stairs. If you do not have them,  ask someone to add them for you. What else can I do to help prevent falls? Wear shoes that: Do not have high heels. Have rubber bottoms. Are comfortable and fit you well. Are closed at the toe. Do not wear sandals. If you use a stepladder: Make sure that it is fully opened. Do not climb a closed stepladder. Make sure that both sides of the stepladder are locked into place. Ask someone to hold it for you, if possible. Clearly mark and make sure that you can see: Any grab bars or handrails. First and last steps. Where the edge of each step is. Use tools that help you move around (mobility aids) if they are needed. These include: Canes. Walkers. Scooters. Crutches. Turn on the lights when you go into a dark area. Replace any light bulbs as soon as they burn out. Set up your furniture so you have a clear path. Avoid moving your furniture around. If any of your floors are uneven, fix them. If there are any pets around you, be aware of where they are. Review your medicines with your doctor. Some medicines can make you feel dizzy. This can increase your chance of falling. Ask your doctor what other things that you can do to help prevent falls. This information is not intended to replace advice given to you by your health care provider. Make sure you discuss any questions you have with your health care provider. Document Released: 11/25/2008 Document Revised: 07/07/2015 Document Reviewed: 03/05/2014 Elsevier Interactive Patient Education  2017 [employer].

## 2022-02-14 NOTE — Progress Notes (Signed)
Subjective:   Courtney Mcconnell is a 67 y.o. female who presents for Medicare Annual (Subsequent) preventive examination.  I connected with  Courtney Mcconnell on 02/14/22 by a audio enabled telemedicine application and verified that I am speaking with the correct person using two identifiers.  Patient Location: Home  Provider Location: Office/Clinic  I discussed the limitations of evaluation and management by telemedicine. The patient expressed understanding and agreed to proceed.  Review of Systems     Cardiac Risk Factors include: advanced age (>47mn, >>65women);dyslipidemia;obesity (BMI >30kg/m2);sedentary lifestyle     Objective:    Today's Vitals   02/14/22 1516  Weight: 229 lb (103.9 kg)  Height: _0  (1.676 m)   Body mass index is 36.96 kg/m.     02/14/2022    3:21 PM 08/24/2021    1:05 PM 06/17/2021    6:00 PM 06/16/2021    4:57 PM 06/16/2021    4:00 PM 06/08/2021    8:19 AM 04/06/2021   11:18 AM  Advanced Directives  Does Patient Have a Medical Advance Directive? Yes No No  No Yes No  Type of Advance Directive Living will;Healthcare Power of [place]  Does patient want to make changes to medical advance directive? No - Patient declined  No - Patient declined   No - Patient declined   Copy of [place] Chart? Yes - validated most recent copy scanned in chart (See row information)  No - copy requested   No - copy requested   Would patient like information on creating a medical advance directive?   No - Patient declined No - Patient declined   No - Patient declined    Current Medications (verified) Outpatient Encounter Medications as of 02/14/2022  Medication Sig   albuterol (VENTOLIN HFA) 108 (90 Base) MCG/ACT inhaler Inhale 2 puffs into the lungs every 6 (six) hours as needed for wheezing or shortness of breath. INHALE TWO PUFFS INTO THE LUNGS EVERY 6 HOURS AS NEEDED FOR WHEEZING OR SHORTNESS  OF BREATH   atorvastatin (LIPITOR) 40 MG tablet TAKE 1 TABLET BY MOUTH EVERY DAY AT SIX IN THE EVENING   carvedilol (COREG) 3.125 MG tablet TAKE 1 TABLET BY MOUTH TWICE DAILY WITH A MEAL   clindamycin (CLEOCIN) 300 MG capsule Take 1 capsule (300 mg total) by mouth 3 (three) times daily.   diclofenac Sodium (VOLTAREN) 1 % GEL Apply 2 g topically 4 (four) times daily.   fluticasone-salmeterol (ADVAIR) 100-50 MCG/ACT AEPB Inhale 1 puff into the lungs 2 (two) times daily.   Lacosamide (VIMPAT) 150 MG TABS Take 300 mg by mouth 2 (two) times daily.   losartan (COZAAR) 25 MG tablet TAKE 1 TABLET BY MOUTH EVERY DAY   mupirocin ointment (BACTROBAN) 2 % Apply 1 Application topically 2 (two) times daily.   nitroGLYCERIN (NITROSTAT) 0.4 MG SL tablet PLACE ONE TABLET UNDER THE TONGUE EVERY FIVE MINUTES AS NEEDED FOR CHEST PAIN   omeprazole (PRILOSEC OTC) 20 MG tablet Take 20 mg by mouth 2 (two) times daily.   tiotropium (SPIRIVA HANDIHALER) 18 MCG inhalation capsule place 1 CAPSULE into inhaler AND INHALE DAILY   zonisamide (ZONEGRAN) 100 MG capsule Take 200 mg by mouth at bedtime.   cetirizine (ZYRTEC ALLERGY) 10 MG tablet Take 1 tablet (10 mg total) by mouth daily for 14 days.   Facility-Administered Encounter Medications as of 02/14/2022  Medication   methylPREDNISolone acetate (  DEPO-MEDROL) injection 80 mg    Allergies (verified) Penicillins, Bee venom, Prednisone, Augmentin [amoxicillin-pot clavulanate], and Doxycycline hyclate   History: Past Medical History:  Diagnosis Date   Asthma    Bursitis    Hip   CAD (coronary artery disease) 02/11/2019   PCI/DES x1 to mLAD, focal ostial 70% lesion in 1st diag   COPD (chronic obstructive pulmonary disease) (HCC)    Essential hypertension    GERD (gastroesophageal reflux disease)    Headache(784.0)    Hyperlipidemia    NSTEMI (non-ST elevated myocardial infarction) ([place]) 02/11/2019   Pneumonia    Seizure ([place])    Sleep apnea    Past Surgical  History:  Procedure Laterality Date   ABDOMINAL HYSTERECTOMY     APPENDECTOMY     CATARACT EXTRACTION Bilateral    CHOLECYSTECTOMY     CORONARY STENT INTERVENTION N/A 02/11/2019   Procedure: CORONARY STENT INTERVENTION;  Surgeon: Burnell Blanks, MD;  Location: [facility];; mid LAD 70% -- DES PCI (Synergy DES 2.5 x 28 --2.8 mm)   heal spur     right   INTRAVASCULAR PRESSURE WIRE/FFR STUDY N/A 02/11/2019   Procedure: INTRAVASCULAR PRESSURE WIRE/FFR STUDY;  Surgeon: Burnell Blanks, MD;  Location: [place] [facility];  Service: Cardiovascular;  Laterality: N/A;   LASER PHOTO ABLATION Right 08/27/2013   Procedure: LASER PHOTO ABLATION;  Surgeon: Hayden Pedro, MD;  Location: [place];  Service: Ophthalmology;  Laterality: Right;  Headscope laser AND Endolaser   LEFT HEART CATH AND CORONARY ANGIOGRAPHY N/A 02/11/2019   Procedure: LEFT HEART CATH AND CORONARY ANGIOGRAPHY;  Surgeon: Burnell Blanks, MD;  Location: [place] [facility];;; prox RCA 20%. Ost-prox Cx 20%. ostial D1 70% & mLAD 70% => (DES PCI of LAD)   MEMBRANE PEEL Right 08/27/2013   Procedure: MEMBRANE PEEL;  Surgeon: Hayden Pedro, MD;  Location: [place];  Service: Ophthalmology;  Laterality: Right;   PARS PLANA VITRECTOMY Right 08/27/2013   Procedure: PARS PLANA VITRECTOMY WITH 25 GAUGE;  Surgeon: Hayden Pedro, MD;  Location: [place];  Service: Ophthalmology;  Laterality: Right;   TOTAL KNEE ARTHROPLASTY Right 01/09/2021   Procedure: RIGHT TOTAL KNEE ARTHROPLASTY;  Surgeon: [name], MD;  Location: [place];  Service: Orthopedics;  Laterality: Right;  Needs RNFA   TOTAL KNEE ARTHROPLASTY Left 06/16/2021   Procedure: LEFT TOTAL KNEE ARTHROPLASTY;  Surgeon: [name], MD;  Location: [place];  Service: Orthopedics;  Laterality: Left;   Family History  Problem Relation Age of Onset   COPD Mother    Diabetes Father    Hypertension Father    Hyperlipidemia Father    Cancer Sister        Bone Cancer    Diabetes Brother    Cancer Brother    Cancer Paternal Uncle    Stroke Paternal Grandmother    Healthy Daughter    Healthy Son    Colon cancer Neg Hx    Social History   Socioeconomic History   Marital status: Widowed    Spouse name: Courtney Mcconnell   Number of children: 2   Years of education: GED   Highest education level: Not on file  Occupational History   Occupation: COOK    Employer: [employer]  Tobacco Use   Smoking status: Former    Packs/day: 0.25    Years: 42.00    Total pack years: 10.50    Types: Cigarettes    Quit date: 03/20/2014  Years since quitting: 7.9   Smokeless tobacco: Never   Tobacco comments:    01/21/15 restarted smoking 4 mos ago  Vaping Use   Vaping Use: Never used  Substance and Sexual Activity   Alcohol use: No    Alcohol/week: 0.0 standard drinks of alcohol   Drug use: No   Sexual activity: Not on file  Other Topics Concern   Not on file  Social History Narrative   Patient lives at home with family.   Caffeine Use: 1 pot and a half a day   Social Determinants of Health   Financial Resource Strain: Low Risk  (02/14/2022)   Overall Financial Resource Strain (CARDIA)    Difficulty of Paying Living Expenses: Not hard at all  Food Insecurity: No Food Insecurity (02/14/2022)   Hunger Vital Sign    Worried About Running Out of Food in the Last Year: Never true    Ran Out of Food in the Last Year: Never true  Transportation Needs: No Transportation Needs (02/14/2022)   PRAPARE - Hydrologist (Medical): No    Lack of Transportation (Non-Medical): No  Physical Activity: Unknown (02/14/2022)   Exercise Vital Sign    Days of Exercise per Week: Not on file    Minutes of Exercise per Session: 0 min  Stress: No Stress Concern Present (02/14/2022)   [place]    Feeling of Stress : Not at all  Social Connections: Moderately Isolated (02/14/2022)   Social  Connection and Isolation Panel [NHANES]    Frequency of Communication with Friends and Family: More than three times a week    Frequency of Social Gatherings with Friends and Family: Three times a week    Attends Religious Services: 1 to 4 times per year    Active Member of Clubs or Organizations: No    Attends Archivist Meetings: Never    Marital Status: Widowed    Tobacco Counseling Counseling given: Not Answered Tobacco comments: 01/21/15 restarted smoking 4 mos ago   Clinical Intake:  Pre-visit preparation completed: Yes  Pain : No/denies pain  Diabetes: No  How often do you need to have someone help you when you read instructions, pamphlets, or other written materials from your doctor or pharmacy?: 1 - Never  Diabetic?No   Interpreter Needed?: No  Information entered by :: Denman George LPN   Activities of Daily Living    02/14/2022    3:21 PM 06/16/2021    4:00 PM  In your present state of health, do you have any difficulty performing the following activities:  Hearing? 0 0  Vision? 0 0  Difficulty concentrating or making decisions? 0 0  Walking or climbing stairs? 0 1  Dressing or bathing? 0 0  Doing errands, shopping? 0 0  Preparing Food and eating ? N   Using the Toilet? N   In the past six months, have you accidently leaked urine? N   Do you have problems with loss of bowel control? N   Managing your Medications? N   Managing your Finances? N   Housekeeping or managing your Housekeeping? N     Patient Care Team: Dettinger, Fransisca Kaufmann, MD as PCP - General (Family Medicine) Satira Sark, MD as PCP - Cardiology (Cardiology)  Indicate any recent Medical Services you may have received from other than [facility] providers in the past year (date may be approximate).     Assessment:  This is a routine wellness examination for Hanaa.  Hearing/Vision screen Hearing Screening - Comments:: Denies hearing difficulties   Vision Screening - Comments::  up to date with routine eye exams   Dietary issues and exercise activities discussed: Current Exercise Habits: The patient does not participate in regular exercise at present   Goals Addressed   None   Depression Screen    02/14/2022    3:20 PM 02/13/2022    9:27 AM 12/06/2021    3:20 PM 10/11/2021    2:11 PM 02/08/2021    4:36 PM 10/27/2020    8:52 AM 08/09/2020   10:39 AM  PHQ 2/9 Scores  PHQ - 2 Score 0 0 0 0 0 0 0  PHQ- 9 Score  0 0 1       Fall Risk    02/14/2022    3:19 PM 02/13/2022    9:27 AM 12/06/2021    3:20 PM 10/11/2021    2:11 PM 02/08/2021    4:36 PM  [place] in the past year? 0 0 0 0 0  Number falls in past yr: 0 0     Injury with Fall? 0 0     Risk for fall due to : No Fall Risks No Fall Risks     Follow up Falls evaluation completed;Education provided;Falls prevention discussed Education provided       FALL RISK PREVENTION PERTAINING TO THE HOME:  Any stairs in or around the home? No  If so, are there any without handrails? No  Home free of loose throw rugs in walkways, pet beds, electrical cords, etc? Yes  Adequate lighting in your home to reduce risk of falls? Yes   ASSISTIVE DEVICES UTILIZED TO PREVENT FALLS:  Life alert? No  Use of a cane, walker or w/c? No  Grab bars in the bathroom? Yes  Shower chair or bench in shower? No  Elevated toilet seat or a handicapped toilet? Yes   TIMED UP AND GO:  Was the test performed? No Telephonic visit   Cognitive Function:        02/14/2022    3:21 PM 02/08/2021    4:31 PM  6CIT Screen  What Year? 0 points 0 points  What month? 0 points 0 points  What time? 0 points 0 points  Count back from 20 0 points 0 points  Months in reverse 0 points 2 points  Repeat phrase 2 points 2 points  Total Score 2 points 4 points    Immunizations Immunization History  Administered Date(s) Administered   MMR 08/26/2007   Td 08/11/2007   Tdap 08/12/2014    TDAP status: Due, Education has been provided  regarding the importance of this vaccine. Advised may receive this vaccine at local pharmacy or Health Dept. Aware to provide a copy of the vaccination record if obtained from local pharmacy or Health Dept. Verbalized acceptance and understanding.  Flu Vaccine status: Declined, Education has been provided regarding the importance of this vaccine but patient still declined. Advised may receive this vaccine at local pharmacy or Health Dept. Aware to provide a copy of the vaccination record if obtained from local pharmacy or Health Dept. Verbalized acceptance and understanding.  Pneumococcal vaccine status: Declined,  Education has been provided regarding the importance of this vaccine but patient still declined. Advised may receive this vaccine at local pharmacy or Health Dept. Aware to provide a copy of the vaccination record if obtained from local pharmacy or Health Dept.  Verbalized acceptance and understanding.   Covid-19 vaccine status: Information provided on how to obtain vaccines.   Qualifies for Shingles Vaccine? Yes   Zostavax completed No   Shingrix Completed?: No.    Education has been provided regarding the importance of this vaccine. Patient has been advised to call insurance company to determine out of pocket expense if they have not yet received this vaccine. Advised may also receive vaccine at local pharmacy or Health Dept. Verbalized acceptance and understanding.  Screening Tests Health Maintenance  Topic Date Due   COVID-19 Vaccine (1) 03/02/2022 (Originally 10/06/1960)   INFLUENZA VACCINE  05/13/2022 (Originally 09/12/2021)   Zoster Vaccines- Shingrix (1 of 2) 05/16/2022 (Originally 10[birth year]1974)   Pneumonia Vaccine 92+ Years old (1 - PCV) 10/12/2022 (Originally 10/06/1961)   COLONOSCOPY (Pts 45-104yr Insurance coverage will need to be confirmed)  10/12/2022 (Originally 10/06/2000)   MAMMOGRAM  02/15/2023 (Originally 04/06/2021)   Medicare Annual Wellness (AWV)  02/15/2023    DTaP/Tdap/Td (3 - Td or Tdap) 08/11/2024   DEXA SCAN  Completed   Hepatitis C Screening  Completed   HPV VACCINES  Aged Out    Health Maintenance  There are no preventive care reminders to display for this patient.   Colorectal cancer screening: No longer required.   Mammogram status: No longer required due to patient declining .  Bone Density status: Completed 11/02/20. Results reflect: Bone density results: NORMAL. Repeat every 5 years.  Lung Cancer Screening: (Low Dose CT Chest recommended if Age 67-80years, 30 pack-year currently smoking OR have quit w/in 15years.) does not qualify.   Lung Cancer Screening Referral: n/a   Additional Screening:  Hepatitis C Screening: does qualify; Completed 09/15/20  Vision Screening: Recommended annual ophthalmology exams for early detection of glaucoma and other disorders of the eye. Is the patient up to date with their annual eye exam?  No  Who is the provider or what is the name of the office in which the patient attends annual eye exams? Patient plans to establish this year If pt is not established with a provider, would they like to be referred to a provider to establish care? No .   Dental Screening: Recommended annual dental exams for proper oral hygiene  Community Resource Referral / Chronic Care Management: CRR required this visit?  No   CCM required this visit?  No      Plan:     I have personally reviewed and noted the following in the patient's chart:   Medical and social history Use of alcohol, tobacco or illicit drugs  Current medications and supplements including opioid prescriptions. Patient is not currently taking opioid prescriptions. Functional ability and status Nutritional status Physical activity Advanced directives List of other physicians Hospitalizations, surgeries, and ER visits in previous 12 months Vitals Screenings to include cognitive, depression, and falls Referrals and appointments  In  addition, I have reviewed and discussed with patient certain preventive protocols, quality metrics, and best practice recommendations. A written personalized care plan for preventive services as well as general preventive health recommendations were provided to patient.     SVanetta Mulders [place]  15/07/2561  Due to this being a virtual visit, the after visit summary with patients personalized plan was offered to patient via mail or my-chart Patient would like to access on my-chart  Nurse Notes: No concerns

## 2022-02-27 ENCOUNTER — Encounter: Payer: Self-pay | Admitting: Family Medicine

## 2022-02-27 ENCOUNTER — Ambulatory Visit (INDEPENDENT_AMBULATORY_CARE_PROVIDER_SITE_OTHER): Payer: Medicare Other | Admitting: Family Medicine

## 2022-02-27 ENCOUNTER — Telehealth: Payer: Self-pay | Admitting: Family Medicine

## 2022-02-27 VITALS — BP 147/78 | HR 64 | Temp 97.4°F | Ht 66.0 in | Wt 226.0 lb

## 2022-02-27 DIAGNOSIS — M25511 Pain in right shoulder: Secondary | ICD-10-CM

## 2022-02-27 MED ORDER — NAPROXEN 500 MG PO TABS: 500.0000 mg | ORAL_TABLET | Freq: Two times a day (BID) | ORAL | 0 refills | Status: AC

## 2022-02-27 MED ORDER — PREDNISONE 20 MG PO TABS: 40.0000 mg | ORAL_TABLET | Freq: Every day | ORAL | 0 refills | Status: DC

## 2022-02-27 NOTE — Progress Notes (Signed)
Subjective:  Patient ID: Courtney Mcconnell, female    DOB: Jan 02, 1956, 67 y.o.   MRN: [id number]  Patient Care Team: Dettinger, Fransisca Kaufmann, MD as PCP - General (Family Medicine) Satira Sark, MD as PCP - Cardiology (Cardiology)   Chief Complaint:  Shoulder Pain (Right x 2 weeks but has gotten worse )   HPI: Courtney Mcconnell is a 67 y.o. female presenting on 02/27/2022 for Shoulder Pain (Right x 2 weeks but has gotten worse )   Shoulder Pain  The pain is present in the right shoulder. This is a new problem. Episode onset: 2 weeks ago. There has been no history of extremity trauma. The problem occurs intermittently. The problem has been waxing and waning. The quality of the pain is described as aching, burning and sharp. The pain is moderate. Associated symptoms include a limited range of motion and stiffness. Pertinent negatives include no fever, inability to bear weight, joint locking, joint swelling, numbness or tingling. The symptoms are aggravated by activity. Treatments tried: topical rubs. The treatment provided mild relief.    Relevant past medical, surgical, family, and social history reviewed and updated as indicated.  Allergies and medications reviewed and updated. Data reviewed: Chart in Epic.   Past Medical History:  Diagnosis Date   Asthma    Bursitis    Hip   CAD (coronary artery disease) 02/11/2019   PCI/DES x1 to mLAD, focal ostial 70% lesion in 1st diag   COPD (chronic obstructive pulmonary disease) (HCC)    Essential hypertension    GERD (gastroesophageal reflux disease)    Headache(784.0)    Hyperlipidemia    NSTEMI (non-ST elevated myocardial infarction) ([place]) 02/11/2019   Pneumonia    Seizure ([place])    Sleep apnea     Past Surgical History:  Procedure Laterality Date   ABDOMINAL HYSTERECTOMY     APPENDECTOMY     CATARACT EXTRACTION Bilateral    CHOLECYSTECTOMY     CORONARY STENT INTERVENTION N/A 02/11/2019   Procedure: CORONARY STENT INTERVENTION;   Surgeon: Burnell Blanks, MD;  Location: [facility];; mid LAD 70% -- DES PCI (Synergy DES 2.5 x 28 --2.8 mm)   heal spur     right   INTRAVASCULAR PRESSURE WIRE/FFR STUDY N/A 02/11/2019   Procedure: INTRAVASCULAR PRESSURE WIRE/FFR STUDY;  Surgeon: Burnell Blanks, MD;  Location: [facility];  Service: Cardiovascular;  Laterality: N/A;   LASER PHOTO ABLATION Right 08/27/2013   Procedure: LASER PHOTO ABLATION;  Surgeon: Hayden Pedro, MD;  Location: [facility];  Service: Ophthalmology;  Laterality: Right;  Headscope laser AND Endolaser   LEFT HEART CATH AND CORONARY ANGIOGRAPHY N/A 02/11/2019   Procedure: LEFT HEART CATH AND CORONARY ANGIOGRAPHY;  Surgeon: Burnell Blanks, MD;  Location: [facility];;; prox RCA 20%. Ost-prox Cx 20%. ostial D1 70% & mLAD 70% => (DES PCI of LAD)   MEMBRANE PEEL Right 08/27/2013   Procedure: MEMBRANE PEEL;  Surgeon: Hayden Pedro, MD;  Location: [facility];  Service: Ophthalmology;  Laterality: Right;   PARS PLANA VITRECTOMY Right 08/27/2013   Procedure: PARS PLANA VITRECTOMY WITH 25 GAUGE;  Surgeon: Hayden Pedro, MD;  Location: [facility];  Service: Ophthalmology;  Laterality: Right;   TOTAL KNEE ARTHROPLASTY Right 01/09/2021   Procedure: RIGHT TOTAL KNEE ARTHROPLASTY;  Surgeon: [name], MD;  Location: [facility];  Service: Orthopedics;  Laterality: Right;  Needs RNFA   TOTAL KNEE ARTHROPLASTY Left 06/16/2021  Procedure: LEFT TOTAL KNEE ARTHROPLASTY;  Surgeon: [name], MD;  Location: [facility];  Service: Orthopedics;  Laterality: Left;    Social History   Socioeconomic History   Marital status: Widowed    Spouse name: [name]   Number of children: 2   Years of education: GED   Highest education level: Not on file  Occupational History   Occupation: COOK    Employer: [employer]  Tobacco Use   Smoking status: Former    Packs/day: 0.25    Years: 42.00    Total pack years: 10.50    Types: Cigarettes    Quit  date: 03/20/2014    Years since quitting: 7.9   Smokeless tobacco: Never   Tobacco comments:    01/21/15 restarted smoking 4 mos ago  Vaping Use   Vaping Use: Never used  Substance and Sexual Activity   Alcohol use: No    Alcohol/week: 0.0 standard drinks of alcohol   Drug use: No   Sexual activity: Not on file  Other Topics Concern   Not on file  Social History Narrative   Patient lives at home with family.   Caffeine Use: 1 pot and a half a day   Social Determinants of Health   Financial Resource Strain: Low Risk  (02/14/2022)   Overall Financial Resource Strain (CARDIA)    Difficulty of Paying Living Expenses: Not hard at all  Food Insecurity: No Food Insecurity (02/14/2022)   Hunger Vital Sign    Worried About Running Out of Food in the Last Year: Never true    Ran Out of Food in the Last Year: Never true  Transportation Needs: No Transportation Needs (02/14/2022)   PRAPARE - Hydrologist (Medical): No    Lack of Transportation (Non-Medical): No  Physical Activity: Unknown (02/14/2022)   Exercise Vital Sign    Days of Exercise per Week: Not on file    Minutes of Exercise per Session: 0 min  Stress: No Stress Concern Present (02/14/2022)   [place]    Feeling of Stress : Not at all  Social Connections: Moderately Isolated (02/14/2022)   Social Connection and Isolation Panel [NHANES]    Frequency of Communication with Friends and Family: More than three times a week    Frequency of Social Gatherings with Friends and Family: Three times a week    Attends Religious Services: 1 to 4 times per year    Active Member of Clubs or Organizations: No    Attends Archivist Meetings: Never    Marital Status: Widowed  Intimate Partner Violence: Not At Risk (02/14/2022)   Humiliation, Afraid, Rape, and Kick questionnaire    Fear of Current or Ex-Partner: No    Emotionally Abused: No     Physically Abused: No    Sexually Abused: No    Outpatient Encounter Medications as of 02/27/2022  Medication Sig   albuterol (VENTOLIN HFA) 108 (90 Base) MCG/ACT inhaler Inhale 2 puffs into the lungs every 6 (six) hours as needed for wheezing or shortness of breath. INHALE TWO PUFFS INTO THE LUNGS EVERY 6 HOURS AS NEEDED FOR WHEEZING OR SHORTNESS OF BREATH   atorvastatin (LIPITOR) 40 MG tablet TAKE 1 TABLET BY MOUTH EVERY DAY AT SIX IN THE EVENING   carvedilol (COREG) 3.125 MG tablet TAKE 1 TABLET BY MOUTH TWICE DAILY WITH A MEAL   diclofenac Sodium (VOLTAREN) 1 % GEL Apply 2 g  topically 4 (four) times daily.   fluticasone-salmeterol (ADVAIR) 100-50 MCG/ACT AEPB Inhale 1 puff into the lungs 2 (two) times daily.   Lacosamide (VIMPAT) 150 MG TABS Take 300 mg by mouth 2 (two) times daily.   losartan (COZAAR) 25 MG tablet TAKE 1 TABLET BY MOUTH EVERY DAY   mupirocin ointment (BACTROBAN) 2 % Apply 1 Application topically 2 (two) times daily.   nitroGLYCERIN (NITROSTAT) 0.4 MG SL tablet PLACE ONE TABLET UNDER THE TONGUE EVERY FIVE MINUTES AS NEEDED FOR CHEST PAIN   omeprazole (PRILOSEC OTC) 20 MG tablet Take 20 mg by mouth 2 (two) times daily.   predniSONE (DELTASONE) 20 MG tablet Take 2 tablets (40 mg total) by mouth daily with breakfast for 5 days.   tiotropium (SPIRIVA HANDIHALER) 18 MCG inhalation capsule place 1 CAPSULE into inhaler AND INHALE DAILY   zonisamide (ZONEGRAN) 100 MG capsule Take 200 mg by mouth at bedtime.   cetirizine (ZYRTEC ALLERGY) 10 MG tablet Take 1 tablet (10 mg total) by mouth daily for 14 days.   [DISCONTINUED] clindamycin (CLEOCIN) 300 MG capsule Take 1 capsule (300 mg total) by mouth 3 (three) times daily.   [DISCONTINUED] methylPREDNISolone acetate (DEPO-MEDROL) injection 80 mg    No facility-administered encounter medications on file as of 02/27/2022.    Allergies  Allergen Reactions   Penicillins Hives   Bee Venom Swelling   Prednisone Other (See Comments)     Patient did not like how it made her feel, caused jittery feeling.    Augmentin [Amoxicillin-Pot Clavulanate] Rash   Doxycycline Hyclate Itching and Rash    Review of Systems  Constitutional:  Negative for activity change, appetite change, chills, diaphoresis, fatigue, fever and unexpected weight change.  HENT: Negative.    Eyes: Negative.   Respiratory:  Negative for cough, chest tightness and shortness of breath.   Cardiovascular:  Negative for chest pain, palpitations and leg swelling.  Gastrointestinal:  Negative for abdominal pain, blood in stool, constipation, diarrhea, nausea and vomiting.  Endocrine: Negative.   Genitourinary:  Negative for decreased urine volume, difficulty urinating, dysuria, frequency and urgency.  Musculoskeletal:  Positive for arthralgias, myalgias and stiffness. Negative for back pain, gait problem, joint swelling, neck pain and neck stiffness.  Skin: Negative.   Allergic/Immunologic: Negative.   Neurological:  Negative for dizziness, tingling, tremors, seizures, syncope, facial asymmetry, speech difficulty, weakness, light-headedness, numbness and headaches.  Hematological: Negative.   Psychiatric/Behavioral:  Negative for confusion, hallucinations, sleep disturbance and suicidal ideas.   All other systems reviewed and are negative.       Objective:  BP (!) 147/78   Pulse 64   Temp (!) 97.4 F (36.3 C) (Temporal)   Ht '5\' 6"'$  (1.676 m)   Wt 226 lb (102.5 kg)   SpO2 99%   BMI 36.48 kg/m    Wt Readings from Last 3 Encounters:  02/27/22 226 lb (102.5 kg)  02/14/22 229 lb (103.9 kg)  02/13/22 229 lb (103.9 kg)    Physical Exam Vitals and nursing note reviewed.  Constitutional:      General: She is not in acute distress.    Appearance: Normal appearance. She is obese. She is not ill-appearing, toxic-appearing or diaphoretic.  HENT:     Head: Normocephalic and atraumatic.     Mouth/Throat:     Mouth: Mucous membranes are moist.  Eyes:      Conjunctiva/sclera: Conjunctivae normal.     Pupils: Pupils are equal, round, and reactive to light.  Cardiovascular:  Rate and Rhythm: Normal rate and regular rhythm.     Pulses: Normal pulses.     Heart sounds: Normal heart sounds. No murmur heard.    No friction rub. No gallop.  Pulmonary:     Effort: Pulmonary effort is normal.     Breath sounds: Normal breath sounds.  Musculoskeletal:     Right shoulder: Tenderness present. No swelling, deformity, effusion, laceration, bony tenderness or crepitus. Decreased range of motion. Normal strength. Normal pulse.     Left shoulder: Normal.     Right upper arm: Normal.     Left upper arm: Normal.     Cervical back: Normal, normal range of motion and neck supple. No rigidity.     Comments: Right shoulder: positive Neer and Hawkins sign, negative empty can sign  Skin:    General: Skin is warm and dry.     Capillary Refill: Capillary refill takes less than 2 seconds.  Neurological:     General: No focal deficit present.     Mental Status: She is alert and oriented to person, place, and time.  Psychiatric:        Mood and Affect: Mood normal.        Behavior: Behavior normal.        Thought Content: Thought content normal.        Judgment: Judgment normal.     Results for orders placed or performed during the hospital encounter of 11/16/21  CBC  Result Value Ref Range   WBC 7.5 4.0 - 10.5 K/uL   RBC 3.88 3.87 - 5.11 MIL/uL   Hemoglobin 12.3 12.0 - 15.0 g/dL   HCT 37.5 36.0 - 46.0 %   MCV 96.6 80.0 - 100.0 fL   MCH 31.7 26.0 - 34.0 pg   MCHC 32.8 30.0 - 36.0 g/dL   RDW 14.0 11.5 - 15.5 %   Platelets 216 150 - 400 K/uL   nRBC 0.0 0.0 - 0.2 %  Comprehensive metabolic panel  Result Value Ref Range   Sodium 140 135 - 145 mmol/L   Potassium 3.4 (L) 3.5 - 5.1 mmol/L   Chloride 106 98 - 111 mmol/L   CO2 26 22 - 32 mmol/L   Glucose, Bld 126 (H) 70 - 99 mg/dL   BUN 8 8 - 23 mg/dL   Creatinine, Ser 0.82 0.44 - 1.00 mg/dL   Calcium  8.7 (L) 8.9 - 10.3 mg/dL   Total Protein 6.9 6.5 - 8.1 g/dL   Albumin 3.4 (L) 3.5 - 5.0 g/dL   AST 26 15 - 41 U/L   ALT 16 0 - 44 U/L   Alkaline Phosphatase 124 38 - 126 U/L   Total Bilirubin 0.6 0.3 - 1.2 mg/dL   GFR, Estimated >60 >60 mL/min   Anion gap 8 5 - 15  Sedimentation rate  Result Value Ref Range   Sed Rate 55 (H) 0 - 22 mm/hr  C-reactive protein  Result Value Ref Range   CRP 2.5 (H) <1.0 mg/dL  Uric acid  Result Value Ref Range   Uric Acid, Serum 5.2 2.5 - 7.1 mg/dL       Pertinent labs & imaging results that were available during my care of the patient were reviewed by me and considered in my medical decision making.  Assessment & Plan:  Laci was seen today for shoulder pain.  Diagnoses and all orders for this visit:  Acute pain of right shoulder No indications of frozen shoulder. Likely impingement syndrome. Will  burst with prednisone (pt states she can tolerate, not a true allergy) and refer to PT. Pt aware to report new, worsening, or persistent symptoms.  -     predniSONE (DELTASONE) 20 MG tablet; Take 2 tablets (40 mg total) by mouth daily with breakfast for 5 days. -     Ambulatory referral to Physical Therapy     Continue all other maintenance medications.  Follow up plan: Return if symptoms worsen or fail to improve.   Continue healthy lifestyle choices, including diet (rich in fruits, vegetables, and lean proteins, and low in salt and simple carbohydrates) and exercise (at least 30 minutes of moderate physical activity daily).  Educational handout given for shoulder pain  The above assessment and management plan was discussed with the patient. The patient verbalized understanding of and has agreed to the management plan. Patient is aware to call the clinic if they develop any new symptoms or if symptoms persist or worsen. Patient is aware when to return to the clinic for a follow-up visit. Patient educated on when it is appropriate to go to the  emergency department.   Monia Pouch, FNP-C [facility] [phone]

## 2022-02-27 NOTE — Addendum Note (Signed)
Addended by: Baruch Gouty on: 02/27/2022 11:18 AM   Modules accepted: Orders

## 2022-03-02 ENCOUNTER — Encounter: Payer: Self-pay | Admitting: Nurse Practitioner

## 2022-03-02 ENCOUNTER — Ambulatory Visit: Payer: 59 | Attending: Nurse Practitioner | Admitting: Nurse Practitioner

## 2022-03-02 VITALS — BP 150/79 | HR 64 | Ht 66.0 in | Wt 227.8 lb

## 2022-03-02 DIAGNOSIS — G4733 Obstructive sleep apnea (adult) (pediatric): Secondary | ICD-10-CM

## 2022-03-02 DIAGNOSIS — I10 Essential (primary) hypertension: Secondary | ICD-10-CM

## 2022-03-02 DIAGNOSIS — E669 Obesity, unspecified: Secondary | ICD-10-CM

## 2022-03-02 DIAGNOSIS — I2510 Atherosclerotic heart disease of native coronary artery without angina pectoris: Secondary | ICD-10-CM

## 2022-03-02 DIAGNOSIS — E785 Hyperlipidemia, unspecified: Secondary | ICD-10-CM

## 2022-03-02 NOTE — Progress Notes (Signed)
Cardiology Office Note:    Date:  03/02/2022  ID:  Courtney Mcconnell, DOB September 18, 1955, MRN [id number]  PCP:  Dettinger, Fransisca Kaufmann, MD   [facility] Prov[facility] Cardiologist:  Rozann Lesches, MD     Referring MD: Dettinger, Fransisca Kaufmann, MD   CC: Here for follow-up  History of Present Illness:    Courtney Mcconnell is a 67 y.o. female with a hx of the following:  CAD, s/p NSTEMI and s/p PCI/DES to mLAD in 2020 Hypertension Hyperlipidemia COPD, pulmonary nodules OSA History of seizures Rheumatoid arthritis  Had Myoview in August 2022 that was considered low risk.  Patient is a 67 year old female with past medical history as mentioned above.  Last seen by Christell Faith, PA-C on June 09, 2021 via virtual visit for preoperative cardiovascular risk assessment.  She was pending left TKA.  At that time, she reported feeling well and denied any decompensation or angina.  Was very active at her baseline.  She was able to achieve greater than 4 METS of activity without cardiac limitations.   Today she presents for follow-up.  She states she is doing well. Denies any chest pain, shortness of breath, palpitations, syncope, presyncope, dizziness, orthopnea, PND, swelling or significant weight changes, acute bleeding, or claudication. Retired Biomedical scientist. Enjoys cooking.   Past Medical History:  Diagnosis Date   Asthma    Bursitis    Hip   CAD (coronary artery disease) 02/11/2019   PCI/DES x1 to mLAD, focal ostial 70% lesion in 1st diag   COPD (chronic obstructive pulmonary disease) (HCC)    Essential hypertension    GERD (gastroesophageal reflux disease)    Headache(784.0)    Hyperlipidemia    NSTEMI (non-ST elevated myocardial infarction) ([place]) 02/11/2019   Pneumonia    Seizure ([place])    Sleep apnea     Past Surgical History:  Procedure Laterality Date   ABDOMINAL HYSTERECTOMY     APPENDECTOMY     CATARACT EXTRACTION Bilateral    CHOLECYSTECTOMY     CORONARY STENT INTERVENTION N/A  02/11/2019   Procedure: CORONARY STENT INTERVENTION;  Surgeon: Burnell Blanks, MD;  Location: [facility];; mid LAD 70% -- DES PCI (Synergy DES 2.5 x 28 --2.8 mm)   heal spur     right   INTRAVASCULAR PRESSURE WIRE/FFR STUDY N/A 02/11/2019   Procedure: INTRAVASCULAR PRESSURE WIRE/FFR STUDY;  Surgeon: Burnell Blanks, MD;  Location: [facility];  Service: Cardiovascular;  Laterality: N/A;   LASER PHOTO ABLATION Right 08/27/2013   Procedure: LASER PHOTO ABLATION;  Surgeon: Hayden Pedro, MD;  Location: [facility];  Service: Ophthalmology;  Laterality: Right;  Headscope laser AND Endolaser   LEFT HEART CATH AND CORONARY ANGIOGRAPHY N/A 02/11/2019   Procedure: LEFT HEART CATH AND CORONARY ANGIOGRAPHY;  Surgeon: Burnell Blanks, MD;  Location: [facility];;; prox RCA 20%. Ost-prox Cx 20%. ostial D1 70% & mLAD 70% => (DES PCI of LAD)   MEMBRANE PEEL Right 08/27/2013   Procedure: MEMBRANE PEEL;  Surgeon: Hayden Pedro, MD;  Location: [facility];  Service: Ophthalmology;  Laterality: Right;   PARS PLANA VITRECTOMY Right 08/27/2013   Procedure: PARS PLANA VITRECTOMY WITH 25 GAUGE;  Surgeon: Hayden Pedro, MD;  Location: [facility];  Service: Ophthalmology;  Laterality: Right;   TOTAL KNEE ARTHROPLASTY Right 01/09/2021   Procedure: RIGHT TOTAL KNEE ARTHROPLASTY;  Surgeon: [name], MD;  Location: [facility];  Service: Orthopedics;  Laterality: Right;  Needs RNFA  TOTAL KNEE ARTHROPLASTY Left 06/16/2021   Procedure: LEFT TOTAL KNEE ARTHROPLASTY;  Surgeon: [name], MD;  Location: [facility];  Service: Orthopedics;  Laterality: Left;    Current Medications: Current Meds  Medication Sig   albuterol (VENTOLIN HFA) 108 (90 Base) MCG/ACT inhaler Inhale 2 puffs into the lungs every 6 (six) hours as needed for wheezing or shortness of breath. INHALE TWO PUFFS INTO THE LUNGS EVERY 6 HOURS AS NEEDED FOR WHEEZING OR SHORTNESS OF BREATH   aspirin EC 81 MG tablet Take 81 mg by mouth  daily. Swallow whole.   atorvastatin (LIPITOR) 40 MG tablet TAKE 1 TABLET BY MOUTH EVERY DAY AT SIX IN THE EVENING   carvedilol (COREG) 3.125 MG tablet TAKE 1 TABLET BY MOUTH TWICE DAILY WITH A MEAL   cetirizine (ZYRTEC ALLERGY) 10 MG tablet Take 1 tablet (10 mg total) by mouth daily for 14 days.   diclofenac Sodium (VOLTAREN) 1 % GEL Apply 2 g topically 4 (four) times daily.   fluticasone-salmeterol (ADVAIR) 100-50 MCG/ACT AEPB Inhale 1 puff into the lungs 2 (two) times daily.   Lacosamide (VIMPAT) 150 MG TABS Take 300 mg by mouth 2 (two) times daily.   losartan (COZAAR) 25 MG tablet TAKE 1 TABLET BY MOUTH EVERY DAY   naproxen (NAPROSYN) 500 MG tablet Take 1 tablet (500 mg total) by mouth 2 (two) times daily with a meal for 14 days.   nitroGLYCERIN (NITROSTAT) 0.4 MG SL tablet PLACE ONE TABLET UNDER THE TONGUE EVERY FIVE MINUTES AS NEEDED FOR CHEST PAIN   omeprazole (PRILOSEC OTC) 20 MG tablet Take 20 mg by mouth 2 (two) times daily.   tiotropium (SPIRIVA HANDIHALER) 18 MCG inhalation capsule place 1 CAPSULE into inhaler AND INHALE DAILY   zonisamide (ZONEGRAN) 100 MG capsule Take 200 mg by mouth at bedtime.     Allergies:   Penicillins, Bee venom, Prednisone, Augmentin [amoxicillin-pot clavulanate], and Doxycycline hyclate   Social History   Socioeconomic History   Marital status: Widowed    Spouse name: [name]   Number of children: 2   Years of education: GED   Highest education level: Not on file  Occupational History   Occupation: COOK    Employer: [employer]  Tobacco Use   Smoking status: Former    Packs/day: 0.25    Years: 42.00    Total pack years: 10.50    Types: Cigarettes    Quit date: 03/20/2014    Years since quitting: 7.9   Smokeless tobacco: Never   Tobacco comments:    01/21/15 restarted smoking 4 mos ago  Vaping Use   Vaping Use: Never used  Substance and Sexual Activity   Alcohol use: No    Alcohol/week: 0.0 standard drinks of alcohol   Drug  use: No   Sexual activity: Not on file  Other Topics Concern   Not on file  Social History Narrative   Patient lives at home with family.   Caffeine Use: 1 pot and a half a day   Social Determinants of Health   Financial Resource Strain: Low Risk  (02/14/2022)   Overall Financial Resource Strain (CARDIA)    Difficulty of Paying Living Expenses: Not hard at all  Food Insecurity: No Food Insecurity (02/14/2022)   Hunger Vital Sign    Worried About Running Out of Food in the Last Year: Never true    Ran Out of Food in the Last Year: Never true  Transportation Needs: No Transportation Needs (02/14/2022)  PRAPARE - Hydrologist (Medical): No    Lack of Transportation (Non-Medical): No  Physical Activity: Unknown (02/14/2022)   Exercise Vital Sign    Days of Exercise per Week: Not on file    Minutes of Exercise per Session: 0 min  Stress: No Stress Concern Present (02/14/2022)   [place]    Feeling of Stress : Not at all  Social Connections: Moderately Isolated (02/14/2022)   Social Connection and Isolation Panel [NHANES]    Frequency of Communication with Friends and Family: More than three times a week    Frequency of Social Gatherings with Friends and Family: Three times a week    Attends Religious Services: 1 to 4 times per year    Active Member of Clubs or Organizations: No    Attends Archivist Meetings: Never    Marital Status: Widowed     Family History: The patient's family history includes COPD in her mother; Cancer in her brother, paternal uncle, and sister; Diabetes in her brother and father; Healthy in her daughter and son; Hyperlipidemia in her father; Hypertension in her father; Stroke in her paternal grandmother. There is no history of Colon cancer.  ROS:   Review of Systems  Constitutional: Negative.   HENT: Negative.    Eyes: Negative.   Respiratory: Negative.     Cardiovascular: Negative.   Gastrointestinal: Negative.   Genitourinary: Negative.   Musculoskeletal: Negative.   Skin: Negative.   Neurological: Negative.   Endo/Heme/Allergies: Negative.   Psychiatric/Behavioral: Negative.      Please see the history of present illness.    All other systems reviewed and are negative.  EKGs/Labs/Other Studies Reviewed:    The following studies were reviewed today:   EKG:  EKG is not ordered today.   Myoview on 10/12/2020:   Findings are consistent with no significant ischemia. The study is low risk.   No ST deviation was noted.   Defect 1: There is a small defect with mild reduction in uptake present in the apical anteroseptal location(s) that is fixed. There is normal wall motion in the defect area. Consistent with artifact caused by breast attenuation.   Left ventricular function is normal. End diastolic cavity size is normal. End systolic cavity size is normal.   Low risk study with LVEF 66%.  Breast attenuation artifact noted without significant ischemic territories.  Left heart cath on 02/11/2019:  Prox RCA lesion is 20% stenosed. Ost Cx to Prox Cx lesion is 20% stenosed. 1st Diag lesion is 70% stenosed. Mid LAD lesion is 80% stenosed. A drug-eluting stent was successfully placed using a SYNERGY XD 2.50X28. Post intervention, there is a 0% residual stenosis.   1. Severe mid LAD stenosis.  2. Successful PTCA/DES x 1 mid LAD 3. Moderately severe stenosis in the ostium of the moderate caliber diagonal branch.  4. Mild non-obstructive disease in the RCA and Circumflex   Recommendations: DAPT with ASA and Brilinta for one year. High intensity statin and beta blocker.     Echocardiogram on 02/11/2019:  1. Left ventricular ejection fraction, by visual estimation, is 55 to  60%. The left ventricle has normal function. There is no left ventricular  hypertrophy.   2. Left ventricular diastolic parameters are consistent with Grade I   diastolic dysfunction (impaired relaxation).   3. The left ventricle has no regional wall motion abnormalities.   4. Global right ventricle has normal systolic function.The right  ventricular size is normal. No increase in right ventricular wall  thickness.   5. Left atrial size was normal.   6. Right atrial size was normal.   7. Presence of pericardial fat pad.   8. Trivial pericardial effusion is present.   9. The mitral valve is grossly normal. Trivial mitral valve  regurgitation.  10. The tricuspid valve is grossly normal.  11. The aortic valve is grossly normal. Aortic valve regurgitation is not  visualized. No evidence of aortic valve sclerosis or stenosis.  12. The pulmonic valve was grossly normal. Pulmonic valve regurgitation is  not visualized.  13. There is mild dilatation of the ascending aorta measuring 40 mm.  14. TR signal is inadequate for assessing pulmonary artery systolic  pressure.  15. The inferior vena cava is normal in size with greater than 50%  respiratory variability, suggesting right atrial pressure of 3 mmHg.  16. No prior Echocardiogram.  17. The average left ventricular global longitudinal strain is -16.3 %.    Recent Labs: 11/16/2021: ALT 16; BUN 8; Creatinine, Ser 0.82; Hemoglobin 12.3; Platelets 216; Potassium 3.4; Sodium 140  Recent Lipid Panel    Component Value Date/Time   CHOL 133 10/11/2021 1440   TRIG 126 10/11/2021 1440   HDL 53 10/11/2021 1440   CHOLHDL 2.5 10/11/2021 1440   CHOLHDL 3.9 02/11/2019 0211   VLDL 12 02/11/2019 0211   LDLCALC 58 10/11/2021 1440   LDLDIRECT 151 (H) 07/06/2015 1604     Physical Exam:    VS:  BP (!) 150/79 (BP Location: Left Arm, Patient Position: Sitting, Cuff Size: Normal)   Pulse 64   Ht '5\' 6"'$  (1.676 m)   Wt 227 lb 12.8 oz (103.3 kg)   SpO2 100%   BMI 36.77 kg/m     Wt Readings from Last 3 Encounters:  03/02/22 227 lb 12.8 oz (103.3 kg)  02/27/22 226 lb (102.5 kg)  02/14/22 229 lb (103.9 kg)      GEN: Obese, 67 y.o. female in no acute distress HEENT: Normal NECK: No JVD; No carotid bruits CARDIAC: S1/S2, RRR, no murmurs, rubs, gallops; 2+ pulses RESPIRATORY:  Clear to auscultation without rales, wheezing or rhonchi  MUSCULOSKELETAL:  No edema; No deformity  SKIN: Warm and dry NEUROLOGIC:  Alert and oriented x 3 PSYCHIATRIC:  Normal affect   ASSESSMENT:    1. Coronary artery disease involving native heart without angina pectoris, unspecified vessel or lesion type   2. Hyperlipidemia, unspecified hyperlipidemia type   3. Hypertension, unspecified type   4. OSA (obstructive sleep apnea)   5. Obesity (BMI 30-39.9)    PLAN:    In order of problems listed above:  CAD, s/p NSTEMI and s/p PCI/DES to mLAD in 2020 Stable with no anginal symptoms. No indication for ischemic evaluation.  Continue aspirin, atorvastatin, carvedilol, losartan, and nitroglycerin as needed. Heart healthy diet and regular cardiovascular exercise encouraged.   HLD Lipid panel from 4 months ago revealed total cholesterol 133, triglycerides 126, HDL 53, and LDL 58.  Continue atorvastatin. Heart healthy diet and regular cardiovascular exercise encouraged.   HTN BP on arrival 140/80, repeat BP 150/79.  BP well-controlled at home. BP log given today and discussed to monitor BP at home at least 2 hours after medications and sitting for 5-10 minutes.  She will let me know if her SBP readings are greater than 130 consistently in 2 to 3 weeks time.  Continue current medication regimen.  Heart healthy diet and regular cardiovascular exercise encouraged.  OSA Encouraged continued compliance.  5. Obesity BMI today is 36.77. Weight loss via diet and exercise encouraged. Discussed the impact being overweight would have on cardiovascular risk.  6.  Disposition: Follow-up with Dr. Domenic Polite in 6 months or sooner if anything changes.  Medication Adjustments/Labs and Tests Ordered: Current medicines are reviewed at  length with the patient today.  Concerns regarding medicines are outlined above.  No orders of the defined types were placed in this encounter.  No orders of the defined types were placed in this encounter.   Patient Instructions  Medication Instructions:  Your physician recommends that you continue on your current medications as directed. Please refer to the Current Medication list given to you today.  Labwork: none  Testing/Procedures: none  Follow-Up: Your physician recommends that you schedule a follow-up appointment in: 6 months with Dr Domenic Polite  Any Other Special Instructions Will Be Listed Below (If Applicable). Your physician has requested that you regularly monitor and record your blood pressure readings at home. Please use the same machine at the same time of day to check your readings and record them to bring to your follow-up visit. [place] information below   If you need a refill on your cardiac medications before your next appointment, please call your pharmacy.   Signed, Finis Bud, NP  03/04/2022 10:11 PM    [place]

## 2022-03-02 NOTE — Patient Instructions (Addendum)
Medication Instructions:  Your physician recommends that you continue on your current medications as directed. Please refer to the Current Medication list given to you today.  Labwork: none  Testing/Procedures: none  Follow-Up: Your physician recommends that you schedule a follow-up appointment in: 6 months with Dr Domenic Polite  Any Other Special Instructions Will Be Listed Below (If Applicable). Your physician has requested that you regularly monitor and record your blood pressure readings at home. Please use the same machine at the same time of day to check your readings and record them to bring to your follow-up visit. [place] information below   If you need a refill on your cardiac medications before your next appointment, please call your pharmacy.

## 2022-03-04 ENCOUNTER — Encounter: Payer: Self-pay | Admitting: Nurse Practitioner

## 2022-03-10 ENCOUNTER — Other Ambulatory Visit: Payer: Self-pay | Admitting: Cardiology

## 2022-03-13 ENCOUNTER — Ambulatory Visit: Payer: 59 | Attending: Family Medicine | Admitting: Physical Therapy

## 2022-03-13 ENCOUNTER — Other Ambulatory Visit: Payer: Self-pay

## 2022-03-13 DIAGNOSIS — M25611 Stiffness of right shoulder, not elsewhere classified: Secondary | ICD-10-CM | POA: Diagnosis not present

## 2022-03-13 DIAGNOSIS — M25511 Pain in right shoulder: Secondary | ICD-10-CM | POA: Insufficient documentation

## 2022-03-13 NOTE — Therapy (Signed)
OUTPATIENT PHYSICAL THERAPY SHOULDER EVALUATION   Patient Name: Courtney Mcconnell MRN: [id number] DOB:Dec 31, 1955, 67 y.o., female Today's Date: 03/13/2022  END OF SESSION:  PT End of Session - 03/13/22 1539     Visit Number 1    Number of Visits 12    Date for PT Re-Evaluation 06/11/22    Authorization Type FOTO AT LEAST EVERY 5TH VISIT.  PROGRESS NOTE AT 10TH VISIT.  KX MODIFIER AFTER 15 VISITS.    PT Start Time 0312    PT Stop Time 0359    PT Time Calculation (min) 47 min    Activity Tolerance Patient tolerated treatment well    Behavior During Therapy [facility] for tasks assessed/performed             Past Medical History:  Diagnosis Date   Asthma    Bursitis    Hip   CAD (coronary artery disease) 02/11/2019   PCI/DES x1 to mLAD, focal ostial 70% lesion in 1st diag   COPD (chronic obstructive pulmonary disease) (HCC)    Essential hypertension    GERD (gastroesophageal reflux disease)    Headache(784.0)    Hyperlipidemia    NSTEMI (non-ST elevated myocardial infarction) ([place]) 02/11/2019   Pneumonia    Seizure ([place])    Sleep apnea    Past Surgical History:  Procedure Laterality Date   ABDOMINAL HYSTERECTOMY     APPENDECTOMY     CATARACT EXTRACTION Bilateral    CHOLECYSTECTOMY     CORONARY STENT INTERVENTION N/A 02/11/2019   Procedure: CORONARY STENT INTERVENTION;  Surgeon: Burnell Blanks, MD;  [facility];; mid LAD 70% -- DES PCI (Synergy DES 2.5 x 28 --2.8 mm)   heal spur     right   INTRAVASCULAR PRESSURE WIRE/FFR STUDY N/A 02/11/2019   Procedure: INTRAVASCULAR PRESSURE WIRE/FFR STUDY;  Surgeon: Burnell Blanks, MD;  Location: [place] [facility];  Service: Cardiovascular;  Laterality: N/A;   LASER PHOTO ABLATION Right 08/27/2013   Procedure: LASER PHOTO ABLATION;  Surgeon: Hayden Pedro, MD;  Location: [place];  Service: Ophthalmology;  Laterality: Right;  Headscope laser AND Endolaser   LEFT HEART CATH AND CORONARY ANGIOGRAPHY N/A  02/11/2019   Procedure: LEFT HEART CATH AND CORONARY ANGIOGRAPHY;  Surgeon: Burnell Blanks, MD;  Location: [place] INVASIVE CV LAB;;; prox RCA 20%. Ost-prox Cx 20%. ostial D1 70% & mLAD 70% => (DES PCI of LAD)   MEMBRANE PEEL Right 08/27/2013   Procedure: MEMBRANE PEEL;  Surgeon: Hayden Pedro, MD;  Location: [place];  Service: Ophthalmology;  Laterality: Right;   PARS PLANA VITRECTOMY Right 08/27/2013   Procedure: PARS PLANA VITRECTOMY WITH 25 GAUGE;  Surgeon: Hayden Pedro, MD;  Location: [place];  Service: Ophthalmology;  Laterality: Right;   TOTAL KNEE ARTHROPLASTY Right 01/09/2021   Procedure: RIGHT TOTAL KNEE ARTHROPLASTY;  Surgeon: [name], MD;  Location: [place];  Service: Orthopedics;  Laterality: Right;  Needs RNFA   TOTAL KNEE ARTHROPLASTY Left 06/16/2021   Procedure: LEFT TOTAL KNEE ARTHROPLASTY;  Surgeon: [name], MD;  Location: [place];  Service: Orthopedics;  Laterality: Left;   Patient Active Problem List   Diagnosis Date Noted   S/P total knee arthroplasty, left 08/03/2021   History of DVT (deep vein thrombosis) 01/19/2021   S/P TKR (total knee replacement), right 01/09/2021   Rheumatoid arthritis with rheumatoid factor of multiple sites without organ or systems involvement ([place]) 12/02/2020   Coronary artery disease involving native coronary artery  of native heart with angina pectoris ([place]) 05/22/2019   Hyperlipidemia with target LDL less than 70 -> CAD 02/12/2019   Presence of drug coated stent in LAD coronary artery 02/11/2019   History of non-ST elevation myocardial infarction (NSTEMI) 02/10/2019   Cerebral infarction, remote, resolved 06/27/2018   Pulmonary nodules 12/08/2015   Sleep apnea 04/06/2015   Pulmonary emphysema ([place]) 03/02/2015   Seizure disorder ([place]) 01/21/2015   Focal epilepsy with impairment of consciousness ([place]) 12/13/2014   Essential hypertension 04/18/2014   Tobacco use disorder 07/19/2012     REFERRING PROVIDER: Darla Lesches  REFERRING  DIAG: Acute pain of right shoulder.  THERAPY DIAG:  Acute pain of right shoulder  Stiffness of right shoulder, not elsewhere classified  Rationale for Evaluation and Treatment: Rehabilitation  ONSET DATE: ~3 weeks.  SUBJECTIVE:                                                                                                                                                                                      SUBJECTIVE STATEMENT: The patient presents to the clinic with c/o right shoulder pain that came on about three weeks ago for no apparent reason.  The pain has been quite intense and increases with movement.  She cannot sleep well due to pain.  Her pain is rated at an 8/10 and described as a throbbing and shooting pain.  She has used an OTC cream which helps some to decrease pain.  Hot showers feel good as well.    PERTINENT HISTORY: Bilateral TKA's, cardiac stents, H/A's, stents.  PAIN:  Are you having pain? Yes: NPRS scale: 8/10 Pain location: Right shoulder. Pain description: As above. Aggravating factors: Movement. Relieving factors: As above.  PRECAUTIONS: None  WEIGHT BEARING RESTRICTIONS: No  FALLS:  Has patient fallen in last 6 months? No  LIVING ENVIRONMENT: Lives with: lives with their family Lives in: House/apartment Has following equipment at home: None  OCCUPATION: Retired.  PLOF: Independent  PATIENT GOALS:Get out of pain.  NEXT MD VISIT:   OBJECTIVE:    PATIENT SURVEYS:  FOTO 24.   POSTURE: Guarded with rounded right shoulder.  UPPER EXTREMITY ROM:   Very limited active antigravity right shoulder movement due to pain.  Passively to 125 degrees of flexion, ER to 45 degrees and attempt at behind back to right hip.  UPPER EXTREMITY MMT:  Difficult to accurately due to pain but she was able to hold her right shoulder against gravity when brought there passively.  IR/ER with elbow near side is ~4-/5.   SHOULDER SPECIAL TESTS: Positive right  shoulder impingement test.  (-) Drop Arm test.  PALPATION:  Very tender to  palpation over patient's left bicipital groove and posterior cuff musculature.   TODAY'S TREATMENT:                                                                                                                                         DATE: IFC at 80-150 Hz on 40% scan x 20 minutes   PATIENT EDUCATION: Education details: As below. Person educated: Patient Education method: Customer service manager Education comprehension: verbalized understanding  HOME EXERCISE PROGRAM: HOME EXERCISE PROGRAM Created by Mali Joncarlo Friberg Jan 30th, 2024 View at [email or web address] using code: [email or web address] Total 3 Page 1 of 1 PECTORALIS CORNER STRETCH While standing at a corner of a wall, place your arms on the walls with elobws bent so that your upper arms are horizontal and your forearms are directed upwards as shown. Take one step forward towards the corner. Bend your front knee until a stretch is felt along the front of your chest and/or shoulders. Your arms should be pointed downward towards the ground. NOTE: Your legs should control the stretch by bending or straightening your front knee. Repeat 4 Times Hold 30 Seconds Complete 1 Set Perform 4 Times a Day Shoulder Flexion Wall Stretch Standing in front of the wall, walk your fingers up the wall until your arm is raised above your hand and you feel a slight stretch in the shoulder. Repeat 10 Times Hold 5 Seconds Complete 2 Sets Perform 4 Times a Day IR strap stretch Place the involved arm behind your back grasping one end of the a belt/towel.  Place the other end of the belt/towel over the opposite shoulder and use the uninvolved arm to pull the belt, moving the involved arm further up the back. Repeat 5 Times Hold 20 Seconds Perform 1 Times a Day  ASSESSMENT:  CLINICAL IMPRESSION: The patient presents to OPPT with c/o left shoulder pain that is severe with  movement.  The pain came on about three weeks ago for no apparent reason.  Pain refers to her left elbow.  She has limited range of motion and virtually no active antigravity movement due to pain.  Her sleep is disturbed.  She demonstrated a positive right shoulder Impingement test and a negative Drop Arm test.  She is very tender to palpation over her left bicipital groove and posterior cuff musculature.  Patient will benefit from skilled physical therapy intervention to address pain and deficits.  OBJECTIVE IMPAIRMENTS: decreased activity tolerance, decreased ROM, decreased strength, increased muscle spasms, and pain.   ACTIVITY LIMITATIONS: carrying, lifting, sleeping, and reach over head  PARTICIPATION LIMITATIONS: meal prep, cleaning, and laundry  REHAB POTENTIAL: Good  CLINICAL DECISION MAKING: Stable/uncomplicated  EVALUATION COMPLEXITY: Low   GOALS:  LONG TERM GOALS: Target date: 06/11/22  Ind with an HEP. Goal status: INITIAL  2.  Active right shoulder flexion to 145 degrees so the patient can easily reach overhead. Goal status: INITIAL  3.  Active ER to 70 degrees+ to allow for easily donning/doffing of apparel. Goal status: INITIAL  4.  Increase ROM so patient is able to reach behind back to L3. Goal status: INITIAL  5.  Perform ADL's with pain not > 3/10. Goal status: INITIAL   PLAN:  PT FREQUENCY: 1-2x/week  PT DURATION: 6 weeks  PLANNED INTERVENTIONS: Therapeutic exercises, Therapeutic activity, Neuromuscular re-education, Patient/Family education, Self Care, Dry Needling, Electrical stimulation, Cryotherapy, Moist heat, Vasopneumatic device, Ultrasound, Ionotophoresis '4mg'$ /ml Dexamethasone, and Manual therapy  PLAN FOR NEXT SESSION: Review HEP, combo e'stim/US, STW/M, seated UE Ranger, wall ladder, pulleys, supine cane exercises.   Audreanna Torrisi, [place], PT 03/13/2022, 4:03 PM

## 2022-03-20 ENCOUNTER — Encounter: Payer: Self-pay | Admitting: *Deleted

## 2022-03-20 ENCOUNTER — Ambulatory Visit: Payer: 59 | Attending: Family Medicine | Admitting: *Deleted

## 2022-03-20 DIAGNOSIS — M25511 Pain in right shoulder: Secondary | ICD-10-CM | POA: Insufficient documentation

## 2022-03-20 DIAGNOSIS — M25611 Stiffness of right shoulder, not elsewhere classified: Secondary | ICD-10-CM | POA: Diagnosis not present

## 2022-03-20 NOTE — Therapy (Signed)
OUTPATIENT PHYSICAL THERAPY SHOULDER EVALUATION   Patient Name: Courtney Mcconnell MRN: [id number] DOB:10/18/1955, 67 y.o., female Today's Date: 03/20/2022  END OF SESSION:  PT End of Session - 03/20/22 1518     Visit Number 2    Number of Visits 12    Date for PT Re-Evaluation 06/11/22    Authorization Type FOTO AT LEAST EVERY 5TH VISIT.  PROGRESS NOTE AT 10TH VISIT.  KX MODIFIER AFTER 15 VISITS.    PT Start Time 6378    PT Stop Time 1605    PT Time Calculation (min) 50 min             Past Medical History:  Diagnosis Date   Asthma    Bursitis    Hip   CAD (coronary artery disease) 02/11/2019   PCI/DES x1 to mLAD, focal ostial 70% lesion in 1st diag   COPD (chronic obstructive pulmonary disease) (HCC)    Essential hypertension    GERD (gastroesophageal reflux disease)    Headache(784.0)    Hyperlipidemia    NSTEMI (non-ST elevated myocardial infarction) ([place]) 02/11/2019   Pneumonia    Seizure ([place])    Sleep apnea    Past Surgical History:  Procedure Laterality Date   ABDOMINAL HYSTERECTOMY     APPENDECTOMY     CATARACT EXTRACTION Bilateral    CHOLECYSTECTOMY     CORONARY STENT INTERVENTION N/A 02/11/2019   Procedure: CORONARY STENT INTERVENTION;  Surgeon: Burnell Blanks, MD;  [facility];; mid LAD 70% -- DES PCI (Synergy DES 2.5 x 28 --2.8 mm)   heal spur     right   INTRAVASCULAR PRESSURE WIRE/FFR STUDY N/A 02/11/2019   Procedure: INTRAVASCULAR PRESSURE WIRE/FFR STUDY;  Surgeon: Burnell Blanks, MD;  Location: [place] [facility];  Service: Cardiovascular;  Laterality: N/A;   LASER PHOTO ABLATION Right 08/27/2013   Procedure: LASER PHOTO ABLATION;  Surgeon: Hayden Pedro, MD;  Location: [place];  Service: Ophthalmology;  Laterality: Right;  Headscope laser AND Endolaser   LEFT HEART CATH AND CORONARY ANGIOGRAPHY N/A 02/11/2019   Procedure: LEFT HEART CATH AND CORONARY ANGIOGRAPHY;  Surgeon: Burnell Blanks, MD;  Location: [place]  INVASIVE CV LAB;;; prox RCA 20%. Ost-prox Cx 20%. ostial D1 70% & mLAD 70% => (DES PCI of LAD)   MEMBRANE PEEL Right 08/27/2013   Procedure: MEMBRANE PEEL;  Surgeon: Hayden Pedro, MD;  Location: [place];  Service: Ophthalmology;  Laterality: Right;   PARS PLANA VITRECTOMY Right 08/27/2013   Procedure: PARS PLANA VITRECTOMY WITH 25 GAUGE;  Surgeon: Hayden Pedro, MD;  Location: [place];  Service: Ophthalmology;  Laterality: Right;   TOTAL KNEE ARTHROPLASTY Right 01/09/2021   Procedure: RIGHT TOTAL KNEE ARTHROPLASTY;  Surgeon: [name], MD;  Location: [place];  Service: Orthopedics;  Laterality: Right;  Needs RNFA   TOTAL KNEE ARTHROPLASTY Left 06/16/2021   Procedure: LEFT TOTAL KNEE ARTHROPLASTY;  Surgeon: [name], MD;  Location: [place];  Service: Orthopedics;  Laterality: Left;   Patient Active Problem List   Diagnosis Date Noted   S/P total knee arthroplasty, left 08/03/2021   History of DVT (deep vein thrombosis) 01/19/2021   S/P TKR (total knee replacement), right 01/09/2021   Rheumatoid arthritis with rheumatoid factor of multiple sites without organ or systems involvement ([place]) 12/02/2020   Coronary artery disease involving native coronary artery of native heart with angina pectoris ([place]) 05/22/2019   Hyperlipidemia with target LDL less than 70 -> CAD  02/12/2019   Presence of drug coated stent in LAD coronary artery 02/11/2019   History of non-ST elevation myocardial infarction (NSTEMI) 02/10/2019   Cerebral infarction, remote, resolved 06/27/2018   Pulmonary nodules 12/08/2015   Sleep apnea 04/06/2015   Pulmonary emphysema ([place]) 03/02/2015   Seizure disorder ([place]) 01/21/2015   Focal epilepsy with impairment of consciousness ([place]) 12/13/2014   Essential hypertension 04/18/2014   Tobacco use disorder 07/19/2012     REFERRING PROVIDER: Darla Lesches  REFERRING DIAG: Acute pain of right shoulder.  THERAPY DIAG:  Acute pain of right shoulder  Stiffness of right shoulder, not  elsewhere classified  Rationale for Evaluation and Treatment: Rehabilitation  ONSET DATE: ~3 weeks.  SUBJECTIVE:                                                                                                                                                                                      SUBJECTIVE STATEMENT: The patient   5/10 today. Doing a little better   PERTINENT HISTORY: Bilateral TKA's, cardiac stents, H/A's, stents.  PAIN:  Are you having pain? Yes: NPRS scale: 5/10 Pain location: Right shoulder. Pain description: As above. Aggravating factors: Movement. Relieving factors: As above.  PRECAUTIONS: None  WEIGHT BEARING RESTRICTIONS: No  FALLS:  Has patient fallen in last 6 months? No  LIVING ENVIRONMENT: Lives with: lives with their family Lives in: House/apartment Has following equipment at home: None  OCCUPATION: Retired.  PLOF: Independent  PATIENT GOALS:Get out of pain.  NEXT MD VISIT:   OBJECTIVE:    PATIENT SURVEYS:  FOTO 24.   POSTURE: Guarded with rounded right shoulder.  UPPER EXTREMITY ROM:   Very limited active antigravity right shoulder movement due to pain.  Passively to 125 degrees of flexion, ER to 45 degrees and attempt at behind back to right hip.  UPPER EXTREMITY MMT:  Difficult to accurately due to pain but she was able to hold her right shoulder against gravity when brought there passively.  IR/ER with elbow near side is ~4-/5.   SHOULDER SPECIAL TESTS: Positive right shoulder impingement test.  (-) Drop Arm test.  PALPATION:  Very tender to palpation over patient's left bicipital groove and posterior cuff musculature.   TODAY'S TREATMENT:  DATE:                                                    03-20-22                                     EXERCISE LOG  Exercise Repetitions and  Resistance Comments  UE ranger X 6 mins  flexion/extension,CW,CCW   Pulleys X5 mins                Blank cell = exercise not performed today  [place] combo x10 mins 1.5 w/cm2 to RT shldr anteriolateral aspect Discussed HEP   IFC at 80-150 Hz on 40% scan x 150 minutes with HMP   PATIENT EDUCATION: Education details: As below. Person educated: Patient Education method: Customer service manager Education comprehension: verbalized understanding  HOME EXERCISE PROGRAM: HOME EXERCISE PROGRAM Created by Mali Applegate Jan 30th, 2024 View at [email or web address] using code: [email or web address] Total 3 Page 1 of 1 PECTORALIS CORNER STRETCH While standing at a corner of a wall, place your arms on the walls with elobws bent so that your upper arms are horizontal and your forearms are directed upwards as shown. Take one step forward towards the corner. Bend your front knee until a stretch is felt along the front of your chest and/or shoulders. Your arms should be pointed downward towards the ground. NOTE: Your legs should control the stretch by bending or straightening your front knee. Repeat 4 Times Hold 30 Seconds Complete 1 Set Perform 4 Times a Day Shoulder Flexion Wall Stretch Standing in front of the wall, walk your fingers up the wall until your arm is raised above your hand and you feel a slight stretch in the shoulder. Repeat 10 Times Hold 5 Seconds Complete 2 Sets Perform 4 Times a Day IR strap stretch Place the involved arm behind your back grasping one end of the a belt/towel.  Place the other end of the belt/towel over the opposite shoulder and use the uninvolved arm to pull the belt, moving the involved arm further up the back. Repeat 5 Times Hold 20 Seconds Perform 1 Times a Day  ASSESSMENT:  CLINICAL IMPRESSION: Pt arrived doing fairly well with RT shldr and reports decreased pain. Pt did well with current AAROM exs f/b [place] combo and IFC with HMP. Pt felt good end of  session.   OBJECTIVE IMPAIRMENTS: decreased activity tolerance, decreased ROM, decreased strength, increased muscle spasms, and pain.   ACTIVITY LIMITATIONS: carrying, lifting, sleeping, and reach over head  PARTICIPATION LIMITATIONS: meal prep, cleaning, and laundry  REHAB POTENTIAL: Good  CLINICAL DECISION MAKING: Stable/uncomplicated  EVALUATION COMPLEXITY: Low   GOALS:  LONG TERM GOALS: Target date: 06/11/22  Ind with an HEP. Goal status: INITIAL  2.  Active right shoulder flexion to 145 degrees so the patient can easily reach overhead. Goal status: INITIAL  3.  Active ER to 70 degrees+ to allow for easily donning/doffing of apparel. Goal status: INITIAL  4.  Increase ROM so patient is able to reach behind back to L3. Goal status: INITIAL  5.  Perform ADL's with pain not > 3/10. Goal status: INITIAL   PLAN:  PT FREQUENCY: 1-2x/week  PT DURATION: 6 weeks  PLANNED INTERVENTIONS: Therapeutic exercises, Therapeutic activity, Neuromuscular  re-education, Patient/Family education, Self Care, Dry Needling, Electrical stimulation, Cryotherapy, Moist heat, Vasopneumatic device, Ultrasound, Ionotophoresis '4mg'$ /ml Dexamethasone, and Manual therapy  PLAN FOR NEXT SESSION: Review HEP, combo e'stim/US, STW/M, seated UE Ranger, wall ladder, pulleys, supine cane exercises.   Macel Yearsley,CHRIS, PTA 03/20/2022, 6:05 PM

## 2022-03-27 ENCOUNTER — Ambulatory Visit: Payer: 59 | Admitting: Physical Therapy

## 2022-03-27 DIAGNOSIS — M25511 Pain in right shoulder: Secondary | ICD-10-CM

## 2022-03-27 DIAGNOSIS — M25611 Stiffness of right shoulder, not elsewhere classified: Secondary | ICD-10-CM

## 2022-03-27 NOTE — Therapy (Addendum)
OUTPATIENT PHYSICAL THERAPY SHOULDER EVALUATION   Patient Name: Courtney Mcconnell MRN: [id number] DOB:[birth year], 67 y.o., female Today's Date: 03/27/2022  END OF SESSION:  PT End of Session - 03/27/22 1525     Visit Number 3    Number of Visits 12    Date for PT Re-Evaluation 06/11/22    Authorization Type FOTO AT LEAST EVERY 5TH VISIT.  PROGRESS NOTE AT 10TH VISIT.  KX MODIFIER AFTER 15 VISITS.    PT Start Time 0315    PT Stop Time 0402    PT Time Calculation (min) 47 min    Activity Tolerance Patient tolerated treatment well    Behavior During Therapy [facility] for tasks assessed/performed             Past Medical History:  Diagnosis Date   Asthma    Bursitis    Hip   CAD (coronary artery disease) 02/11/2019   PCI/DES x1 to mLAD, focal ostial 70% lesion in 1st diag   COPD (chronic obstructive pulmonary disease) (HCC)    Essential hypertension    GERD (gastroesophageal reflux disease)    Headache(784.0)    Hyperlipidemia    NSTEMI (non-ST elevated myocardial infarction) ([place]) 02/11/2019   Pneumonia    Seizure ([place])    Sleep apnea    Past Surgical History:  Procedure Laterality Date   ABDOMINAL HYSTERECTOMY     APPENDECTOMY     CATARACT EXTRACTION Bilateral    CHOLECYSTECTOMY     CORONARY STENT INTERVENTION N/A 02/11/2019   Procedure: CORONARY STENT INTERVENTION;  Surgeon: Burnell Blanks, MD;  [facility];; mid LAD 70% -- DES PCI (Synergy DES 2.5 x 28 --2.8 mm)   heal spur     right   INTRAVASCULAR PRESSURE WIRE/FFR STUDY N/A 02/11/2019   Procedure: INTRAVASCULAR PRESSURE WIRE/FFR STUDY;  Surgeon: Burnell Blanks, MD;  Location: [place] CV LAB;  Service: Cardiovascular;  Laterality: N/A;   LASER PHOTO ABLATION Right 08/27/2013   Procedure: LASER PHOTO ABLATION;  Surgeon: Hayden Pedro, MD;  Location: [place];  Service: Ophthalmology;  Laterality: Right;  Headscope laser AND Endolaser   LEFT HEART CATH AND CORONARY ANGIOGRAPHY N/A  02/11/2019   Procedure: LEFT HEART CATH AND CORONARY ANGIOGRAPHY;  Surgeon: Burnell Blanks, MD;  Location: [place] INVASIVE CV LAB;;; prox RCA 20%. Ost-prox Cx 20%. ostial D1 70% & mLAD 70% => (DES PCI of LAD)   MEMBRANE PEEL Right 08/27/2013   Procedure: MEMBRANE PEEL;  Surgeon: Hayden Pedro, MD;  Location: [place];  Service: Ophthalmology;  Laterality: Right;   PARS PLANA VITRECTOMY Right 08/27/2013   Procedure: PARS PLANA VITRECTOMY WITH 25 GAUGE;  Surgeon: Hayden Pedro, MD;  Location: [place];  Service: Ophthalmology;  Laterality: Right;   TOTAL KNEE ARTHROPLASTY Right 01/09/2021   Procedure: RIGHT TOTAL KNEE ARTHROPLASTY;  Surgeon: [name], MD;  Location: [place];  Service: Orthopedics;  Laterality: Right;  Needs RNFA   TOTAL KNEE ARTHROPLASTY Left 06/16/2021   Procedure: LEFT TOTAL KNEE ARTHROPLASTY;  Surgeon: [name], MD;  Location: [place];  Service: Orthopedics;  Laterality: Left;   Patient Active Problem List   Diagnosis Date Noted   S/P total knee arthroplasty, left 08/03/2021   History of DVT (deep vein thrombosis) 01/19/2021   S/P TKR (total knee replacement), right 01/09/2021   Rheumatoid arthritis with rheumatoid factor of multiple sites without organ or systems involvement ([place]) 12/02/2020   Coronary artery disease involving native coronary artery  of native heart with angina pectoris ([place]) 05/22/2019   Hyperlipidemia with target LDL less than 70 -> CAD 02/12/2019   Presence of drug coated stent in LAD coronary artery 02/11/2019   History of non-ST elevation myocardial infarction (NSTEMI) 02/10/2019   Cerebral infarction, remote, resolved 06/27/2018   Pulmonary nodules 12/08/2015   Sleep apnea 04/06/2015   Pulmonary emphysema ([place]) 03/02/2015   Seizure disorder ([place]) 01/21/2015   Focal epilepsy with impairment of consciousness ([place]) 12/13/2014   Essential hypertension 04/18/2014   Tobacco use disorder 07/19/2012     REFERRING PROVIDER: Darla Lesches  REFERRING  DIAG: Acute pain of right shoulder.  THERAPY DIAG:  Acute pain of right shoulder  Stiffness of right shoulder, not elsewhere classified  Rationale for Evaluation and Treatment: Rehabilitation  ONSET DATE: ~3 weeks.  SUBJECTIVE:                                                                                                                                                                                      SUBJECTIVE STATEMENT: Power went out last night and made shoulder hurt more.  Patient requesting heat and e'stim to begin treatment.   PERTINENT HISTORY: Bilateral TKA's, cardiac stents, H/A's, stents.  PAIN:  Are you having pain? Yes: NPRS scale: 6-7/10 Pain location: Right shoulder. Pain description: As above. Aggravating factors: Movement. Relieving factors: As above.  PRECAUTIONS: None  WEIGHT BEARING RESTRICTIONS: No  FALLS:  Has patient fallen in last 6 months? No  LIVING ENVIRONMENT: Lives with: lives with their family Lives in: House/apartment Has following equipment at home: None  OCCUPATION: Retired.  PLOF: Independent  PATIENT GOALS:Get out of pain.  NEXT MD VISIT:   OBJECTIVE:    PATIENT SURVEYS:  FOTO 24.   POSTURE: Guarded with rounded right shoulder.  UPPER EXTREMITY ROM:   Very limited active antigravity right shoulder movement due to pain.  Passively to 125 degrees of flexion, ER to 45 degrees and attempt at behind back to right hip.  UPPER EXTREMITY MMT:  Difficult to accurately due to pain but she was able to hold her right shoulder against gravity when brought there passively.  IR/ER with elbow near side is ~4-/5.   SHOULDER SPECIAL TESTS: Positive right shoulder impingement test.  (-) Drop Arm test.  PALPATION:  Very tender to palpation over patient's left bicipital groove and posterior cuff musculature.   TODAY'S TREATMENT:  DATE:                                                    03-27-22                                 Pre-mod at 80-150 Hz to patient's right anterior shoulder with HMP x 15 minutes f/b PROM x 23 minutes into patient's right shoulder flexion, ER and abduction.   PATIENT EDUCATION: Education details: As below. Person educated: Patient Education method: Customer service manager Education comprehension: verbalized understanding  HOME EXERCISE PROGRAM: [facility] by Mali Latavius Capizzi Feb 13th, 2024 View at [email or web address] using code: 73NARDE Total 1 Page 1 of 1 WAND EXTERNAL ROTATION - SUPINE ER Lie on your back holding a cane or wand with both hands.  On the affected side, place a small rolled up towel or pillow under your elbow. Maintain approx. 90 degree bend at the elbow with your arm approximately 30-45 degrees away from your side. GENTLE. PAIN-FREE Use your other arm to pull the wand/cane to rotate the affected arm back into a stretch. Hold and then return to starting position and then repeat. Repeat 10 Times Hold 15 Seconds Complete 1 Set Perform 4 Times a Day  ASSESSMENT:  CLINICAL IMPRESSION: Patient arriving today with increased pain which she attributes to losing power last night and being cold.  She feels he shoulder "locked-up."     OBJECTIVE IMPAIRMENTS: decreased activity tolerance, decreased ROM, decreased strength, increased muscle spasms, and pain.   ACTIVITY LIMITATIONS: carrying, lifting, sleeping, and reach over head  PARTICIPATION LIMITATIONS: meal prep, cleaning, and laundry  REHAB POTENTIAL: Good  CLINICAL DECISION MAKING: Stable/uncomplicated  EVALUATION COMPLEXITY: Low   GOALS:  LONG TERM GOALS: Target date: 06/11/22  Ind with an HEP. Goal status: INITIAL  2.  Active right shoulder flexion to 145 degrees so the patient can easily reach overhead. Goal status: INITIAL  3.   Active ER to 70 degrees+ to allow for easily donning/doffing of apparel. Goal status: INITIAL  4.  Increase ROM so patient is able to reach behind back to L3. Goal status: INITIAL  5.  Perform ADL's with pain not > 3/10. Goal status: INITIAL   PLAN:  PT FREQUENCY: 1-2x/week  PT DURATION: 6 weeks  PLANNED INTERVENTIONS: Therapeutic exercises, Therapeutic activity, Neuromuscular re-education, Patient/Family education, Self Care, Dry Needling, Electrical stimulation, Cryotherapy, Moist heat, Vasopneumatic device, Ultrasound, Ionotophoresis 2m/ml Dexamethasone, and Manual therapy  PLAN FOR NEXT SESSION: Review HEP, combo e'stim/US, STW/M, seated UE Ranger, wall ladder, pulleys, supine cane exercises.   Pinkie Manger, CMali PT 03/27/2022, 4:05 PM

## 2022-04-04 ENCOUNTER — Ambulatory Visit: Payer: 59

## 2022-04-04 DIAGNOSIS — M25611 Stiffness of right shoulder, not elsewhere classified: Secondary | ICD-10-CM

## 2022-04-04 DIAGNOSIS — M25511 Pain in right shoulder: Secondary | ICD-10-CM | POA: Diagnosis not present

## 2022-04-04 NOTE — Therapy (Signed)
OUTPATIENT PHYSICAL THERAPY SHOULDER TREATMENT   Patient Name: Courtney Mcconnell MRN: [id number] DOB:1955-09-07, 67 y.o., female Today's Date: 04/04/2022  END OF SESSION:  PT End of Session - 04/04/22 1350     Visit Number 4    Number of Visits 12    Date for PT Re-Evaluation 06/11/22    Authorization Type FOTO AT LEAST EVERY 5TH VISIT.  PROGRESS NOTE AT 10TH VISIT.  KX MODIFIER AFTER 15 VISITS.    PT Start Time [id number]    PT Stop Time 1433    PT Time Calculation (min) 48 min    Activity Tolerance Patient tolerated treatment well    Behavior During Therapy WFL for tasks assessed/performed             Past Medical History:  Diagnosis Date   Asthma    Bursitis    Hip   CAD (coronary artery disease) 02/11/2019   PCI/DES x1 to mLAD, focal ostial 70% lesion in 1st diag   COPD (chronic obstructive pulmonary disease) (HCC)    Essential hypertension    GERD (gastroesophageal reflux disease)    Headache(784.0)    Hyperlipidemia    NSTEMI (non-ST elevated myocardial infarction) ([place]) 02/11/2019   Pneumonia    Seizure ([place])    Sleep apnea    Past Surgical History:  Procedure Laterality Date   ABDOMINAL HYSTERECTOMY     APPENDECTOMY     CATARACT EXTRACTION Bilateral    CHOLECYSTECTOMY     CORONARY STENT INTERVENTION N/A 02/11/2019   Procedure: CORONARY STENT INTERVENTION;  Surgeon: Burnell Blanks, MD;  [facility];; mid LAD 70% -- DES PCI (Synergy DES 2.5 x 28 --2.8 mm)   heal spur     right   INTRAVASCULAR PRESSURE WIRE/FFR STUDY N/A 02/11/2019   Procedure: INTRAVASCULAR PRESSURE WIRE/FFR STUDY;  Surgeon: Burnell Blanks, MD;  Location: [place] CV LAB;  Service: Cardiovascular;  Laterality: N/A;   LASER PHOTO ABLATION Right 08/27/2013   Procedure: LASER PHOTO ABLATION;  Surgeon: Hayden Pedro, MD;  Location: [place];  Service: Ophthalmology;  Laterality: Right;  Headscope laser AND Endolaser   LEFT HEART CATH AND CORONARY ANGIOGRAPHY N/A  02/11/2019   Procedure: LEFT HEART CATH AND CORONARY ANGIOGRAPHY;  Surgeon: Burnell Blanks, MD;  Location: [place] INVASIVE CV LAB;;; prox RCA 20%. Ost-prox Cx 20%. ostial D1 70% & mLAD 70% => (DES PCI of LAD)   MEMBRANE PEEL Right 08/27/2013   Procedure: MEMBRANE PEEL;  Surgeon: Hayden Pedro, MD;  Location: [place];  Service: Ophthalmology;  Laterality: Right;   PARS PLANA VITRECTOMY Right 08/27/2013   Procedure: PARS PLANA VITRECTOMY WITH 25 GAUGE;  Surgeon: Hayden Pedro, MD;  Location: [place];  Service: Ophthalmology;  Laterality: Right;   TOTAL KNEE ARTHROPLASTY Right 01/09/2021   Procedure: RIGHT TOTAL KNEE ARTHROPLASTY;  Surgeon: [name], MD;  Location: [place];  Service: Orthopedics;  Laterality: Right;  Needs RNFA   TOTAL KNEE ARTHROPLASTY Left 06/16/2021   Procedure: LEFT TOTAL KNEE ARTHROPLASTY;  Surgeon: [name], MD;  Location: [place];  Service: Orthopedics;  Laterality: Left;   Patient Active Problem List   Diagnosis Date Noted   S/P total knee arthroplasty, left 08/03/2021   History of DVT (deep vein thrombosis) 01/19/2021   S/P TKR (total knee replacement), right 01/09/2021   Rheumatoid arthritis with rheumatoid factor of multiple sites without organ or systems involvement ([place]) 12/02/2020   Coronary artery disease involving native coronary artery  of native heart with angina pectoris ([place]) 05/22/2019   Hyperlipidemia with target LDL less than 70 -> CAD 02/12/2019   Presence of drug coated stent in LAD coronary artery 02/11/2019   History of non-ST elevation myocardial infarction (NSTEMI) 02/10/2019   Cerebral infarction, remote, resolved 06/27/2018   Pulmonary nodules 12/08/2015   Sleep apnea 04/06/2015   Pulmonary emphysema ([place]) 03/02/2015   Seizure disorder ([place]) 01/21/2015   Focal epilepsy with impairment of consciousness ([place]) 12/13/2014   Essential hypertension 04/18/2014   Tobacco use disorder 07/19/2012     REFERRING PROVIDER: Darla Lesches  REFERRING  DIAG: Acute pain of right shoulder.  THERAPY DIAG:  Acute pain of right shoulder  Stiffness of right shoulder, not elsewhere classified  Rationale for Evaluation and Treatment: Rehabilitation  ONSET DATE: ~3 weeks.  SUBJECTIVE:                                                                                                                                                                                      SUBJECTIVE STATEMENT: Patient reports that her shoulder is stiff today. She noticed that it felt good today, but she woke up this morning with this increased stiffness.   PERTINENT HISTORY: Bilateral TKA's, cardiac stents, H/A's, stents.  PAIN:  Are you having pain? Yes: NPRS scale: 6-7/10 Pain location: Right shoulder. Pain description: As above. Aggravating factors: Movement. Relieving factors: As above.  PRECAUTIONS: None  WEIGHT BEARING RESTRICTIONS: No  FALLS:  Has patient fallen in last 6 months? No  LIVING ENVIRONMENT: Lives with: lives with their family Lives in: House/apartment Has following equipment at home: None  OCCUPATION: Retired.  PLOF: Independent  PATIENT GOALS:Get out of pain.  NEXT MD VISIT:   OBJECTIVE:    PATIENT SURVEYS:  FOTO 24.   POSTURE: Guarded with rounded right shoulder.  UPPER EXTREMITY ROM:   Very limited active antigravity right shoulder movement due to pain.  Passively to 125 degrees of flexion, ER to 45 degrees and attempt at behind back to right hip.  UPPER EXTREMITY MMT:  Difficult to accurately due to pain but she was able to hold her right shoulder against gravity when brought there passively.  IR/ER with elbow near side is ~4-/5.  Grip strength:   Right: 5#  Left : 8#   SHOULDER SPECIAL TESTS: Positive right shoulder impingement test.  (-) Drop Arm test.  PALPATION:  Very tender to palpation over patient's left bicipital groove and posterior cuff musculature.   TODAY'S TREATMENT:  DATE:                                                                                        EXERCISE LOG  Exercise Repetitions and Resistance Comments  UBE X6 minutes @ 120 RPM   Pulleys  3 minutes    Ball roll out  30 reps    Scapular retraction  30 reps    AA ER with cane 2 minutes   Towel squeeze  2 minutes    Blank cell = exercise not performed today  Modalities: no redness or adverse reaction to today's modalities  Date:  Unattended Estim: Shoulder, pre mod @ 80-150 Hz, 15 mins, Pain Hot Pack: Shoulder, 15 mins, Pain  PATIENT EDUCATION: Education details: As below. Person educated: Patient Education method: Customer service manager Education comprehension: verbalized understanding  HOME EXERCISE PROGRAM: [place] by Mali Applegate Feb 13th, 2024 View at [email or web address] using code: 73NARDE Total 1 Page 1 of 1 WAND EXTERNAL ROTATION - SUPINE ER Lie on your back holding a cane or wand with both hands.  On the affected side, place a small rolled up towel or pillow under your elbow. Maintain approx. 90 degree bend at the elbow with your arm approximately 30-45 degrees away from your side. GENTLE. PAIN-FREE Use your other arm to pull the wand/cane to rotate the affected arm back into a stretch. Hold and then return to starting position and then repeat. Repeat 10 Times Hold 15 Seconds Complete 1 Set Perform 4 Times a Day  ASSESSMENT:  CLINICAL IMPRESSION: Patient was introduced to multiple new interventions for improved shoulder strength and mobility with moderate difficulty. She required moderate cueing with active assisted external rotation to isolate infraspinatus engagement. She experienced a mild increase in soreness with the pulleys, but this did not inhibit her ability to complete any of today's interventions. She  reported that her shoulder felt better upon the conclusion of treatment. She continues to require skilled physical therapy to address her remaining impairments to maximize her functional mobility.   OBJECTIVE IMPAIRMENTS: decreased activity tolerance, decreased ROM, decreased strength, increased muscle spasms, and pain.   ACTIVITY LIMITATIONS: carrying, lifting, sleeping, and reach over head  PARTICIPATION LIMITATIONS: meal prep, cleaning, and laundry  REHAB POTENTIAL: Good  CLINICAL DECISION MAKING: Stable/uncomplicated  EVALUATION COMPLEXITY: Low   GOALS:  LONG TERM GOALS: Target date: 06/11/22  Ind with an HEP. Goal status: INITIAL  2.  Active right shoulder flexion to 145 degrees so the patient can easily reach overhead. Goal status: INITIAL  3.  Active ER to 70 degrees+ to allow for easily donning/doffing of apparel. Goal status: INITIAL  4.  Increase ROM so patient is able to reach behind back to L3. Goal status: INITIAL  5.  Perform ADL's with pain not > 3/10. Goal status: INITIAL   PLAN:  PT FREQUENCY: 1-2x/week  PT DURATION: 6 weeks  PLANNED INTERVENTIONS: Therapeutic exercises, Therapeutic activity, Neuromuscular re-education, Patient/Family education, Self Care, Dry Needling, Electrical stimulation, Cryotherapy, Moist heat, Vasopneumatic device, Ultrasound, Ionotophoresis 56m/ml Dexamethasone, and Manual therapy  PLAN FOR NEXT SESSION: Review HEP, combo e'stim/US, STW/M, seated UE Ranger, wall  ladder, pulleys, supine cane exercises.   Darlin Coco, PT 04/04/2022, 3:02 PM

## 2022-04-10 ENCOUNTER — Ambulatory Visit: Payer: 59 | Admitting: *Deleted

## 2022-04-12 ENCOUNTER — Ambulatory Visit: Payer: 59 | Admitting: Family Medicine

## 2022-04-18 ENCOUNTER — Ambulatory Visit: Payer: 59 | Attending: Family Medicine

## 2022-04-18 DIAGNOSIS — M25511 Pain in right shoulder: Secondary | ICD-10-CM | POA: Diagnosis not present

## 2022-04-18 DIAGNOSIS — M25611 Stiffness of right shoulder, not elsewhere classified: Secondary | ICD-10-CM | POA: Diagnosis not present

## 2022-04-18 NOTE — Therapy (Signed)
OUTPATIENT PHYSICAL THERAPY SHOULDER TREATMENT   Patient Name: Courtney Mcconnell MRN: [id number] DOB:02/15/1955, 67 y.o., female Today's Date: 04/18/2022  END OF SESSION:  PT End of Session - 04/18/22 1446     Visit Number 5    Number of Visits 12    Date for PT Re-Evaluation 06/11/22    Authorization Type FOTO AT LEAST EVERY 5TH VISIT.  PROGRESS NOTE AT 10TH VISIT.  KX MODIFIER AFTER 15 VISITS.    PT Start Time [id number]    PT Stop Time 1430    PT Time Calculation (min) 45 min    Activity Tolerance Patient tolerated treatment well    Behavior During Therapy WFL for tasks assessed/performed              Past Medical History:  Diagnosis Date   Asthma    Bursitis    Hip   CAD (coronary artery disease) 02/11/2019   PCI/DES x1 to mLAD, focal ostial 70% lesion in 1st diag   COPD (chronic obstructive pulmonary disease) (HCC)    Essential hypertension    GERD (gastroesophageal reflux disease)    Headache(784.0)    Hyperlipidemia    NSTEMI (non-ST elevated myocardial infarction) ([place]) 02/11/2019   Pneumonia    Seizure ([place])    Sleep apnea    Past Surgical History:  Procedure Laterality Date   ABDOMINAL HYSTERECTOMY     APPENDECTOMY     CATARACT EXTRACTION Bilateral    CHOLECYSTECTOMY     CORONARY STENT INTERVENTION N/A 02/11/2019   Procedure: CORONARY STENT INTERVENTION;  Surgeon: Burnell Blanks, MD;  [facility];; mid LAD 70% -- DES PCI (Synergy DES 2.5 x 28 --2.8 mm)   heal spur     right   INTRAVASCULAR PRESSURE WIRE/FFR STUDY N/A 02/11/2019   Procedure: INTRAVASCULAR PRESSURE WIRE/FFR STUDY;  Surgeon: Burnell Blanks, MD;  Location: [place] [facility];  Service: Cardiovascular;  Laterality: N/A;   LASER PHOTO ABLATION Right 08/27/2013   Procedure: LASER PHOTO ABLATION;  Surgeon: Hayden Pedro, MD;  Location: [place];  Service: Ophthalmology;  Laterality: Right;  Headscope laser AND Endolaser   LEFT HEART CATH AND CORONARY ANGIOGRAPHY N/A  02/11/2019   Procedure: LEFT HEART CATH AND CORONARY ANGIOGRAPHY;  Surgeon: Burnell Blanks, MD;  Location: [place] [facility];;; prox RCA 20%. Ost-prox Cx 20%. ostial D1 70% & mLAD 70% => (DES PCI of LAD)   MEMBRANE PEEL Right 08/27/2013   Procedure: MEMBRANE PEEL;  Surgeon: Hayden Pedro, MD;  Location: [place];  Service: Ophthalmology;  Laterality: Right;   PARS PLANA VITRECTOMY Right 08/27/2013   Procedure: PARS PLANA VITRECTOMY WITH 25 GAUGE;  Surgeon: Hayden Pedro, MD;  Location: [place];  Service: Ophthalmology;  Laterality: Right;   TOTAL KNEE ARTHROPLASTY Right 01/09/2021   Procedure: RIGHT TOTAL KNEE ARTHROPLASTY;  Surgeon: [name], MD;  Location: [place];  Service: Orthopedics;  Laterality: Right;  Needs RNFA   TOTAL KNEE ARTHROPLASTY Left 06/16/2021   Procedure: LEFT TOTAL KNEE ARTHROPLASTY;  Surgeon: [name], MD;  Location: [place];  Service: Orthopedics;  Laterality: Left;   Patient Active Problem List   Diagnosis Date Noted   S/P total knee arthroplasty, left 08/03/2021   History of DVT (deep vein thrombosis) 01/19/2021   S/P TKR (total knee replacement), right 01/09/2021   Rheumatoid arthritis with rheumatoid factor of multiple sites without organ or systems involvement ([place]) 12/02/2020   Coronary artery disease involving native coronary  artery of native heart with angina pectoris ([place]) 05/22/2019   Hyperlipidemia with target LDL less than 70 -> CAD 02/12/2019   Presence of drug coated stent in LAD coronary artery 02/11/2019   History of non-ST elevation myocardial infarction (NSTEMI) 02/10/2019   Cerebral infarction, remote, resolved 06/27/2018   Pulmonary nodules 12/08/2015   Sleep apnea 04/06/2015   Pulmonary emphysema ([facility] [place]) 03/02/2015   Seizure disorder ([place]) 01/21/2015   Focal epilepsy with impairment of consciousness ([place]) 12/13/2014   Essential hypertension 04/18/2014   Tobacco use disorder 07/19/2012     REFERRING PROVIDER: Darla Lesches  REFERRING  DIAG: Acute pain of right shoulder.  THERAPY DIAG:  Acute pain of right shoulder  Stiffness of right shoulder, not elsewhere classified  Rationale for Evaluation and Treatment: Rehabilitation  ONSET DATE: ~3 weeks.  SUBJECTIVE:                                                                                                                                                                                      SUBJECTIVE STATEMENT: Patient reports that today is a bad day as she can hardly raise her arm today. She thinks it may be due to the weather.   PERTINENT HISTORY: Bilateral TKA's, cardiac stents, H/A's, stents.  PAIN:  Are you having pain? Yes: NPRS scale: 6-7/10 Pain location: Right shoulder. Pain description: As above. Aggravating factors: Movement. Relieving factors: As above.  PRECAUTIONS: None  WEIGHT BEARING RESTRICTIONS: No  FALLS:  Has patient fallen in last 6 months? No  LIVING ENVIRONMENT: Lives with: lives with their family Lives in: House/apartment Has following equipment at home: None  OCCUPATION: Retired.  PLOF: Independent  PATIENT GOALS:Get out of pain.  NEXT MD VISIT:   OBJECTIVE:    PATIENT SURVEYS:  FOTO 24.   POSTURE: Guarded with rounded right shoulder.  UPPER EXTREMITY ROM:   Very limited active antigravity right shoulder movement due to pain.  Passively to 125 degrees of flexion, ER to 45 degrees and attempt at behind back to right hip.  UPPER EXTREMITY MMT:  Difficult to accurately due to pain but she was able to hold her right shoulder against gravity when brought there passively.  IR/ER with elbow near side is ~4-/5.  Grip strength:   Right: 5#  Left : 8#   SHOULDER SPECIAL TESTS: Positive right shoulder impingement test.  (-) Drop Arm test.  PALPATION:  Very tender to palpation over patient's left bicipital groove and posterior cuff musculature.   TODAY'S TREATMENT:  DATE:                                                                                       3/6  EXERCISE LOG  Exercise Repetitions and Resistance Comments  UE ranger (seated)  2.5 minutes  Flexion  Scapular retraction  30 reps   Elbow flexion  20 reps            Blank cell = exercise not performed today  Manual Therapy Soft Tissue Mobilization: right rotator cuff, for reduced pain and tone   Modalities: no redness or adverse reaction to today's modalities  Date:  Unattended Estim: Shoulder, IFC @ 80-150 Hz w/ 40% scan, 15 mins, Pain and Tone Hot Pack: Shoulder, 15 mins, Pain and Tone                                   2/21 EXERCISE LOG  Exercise Repetitions and Resistance Comments  UBE X6 minutes @ 120 RPM   Pulleys  3 minutes    Ball roll out  30 reps    Scapular retraction  30 reps    AA ER with cane 2 minutes   Towel squeeze  2 minutes    Blank cell = exercise not performed today  Modalities: no redness or adverse reaction to today's modalities  Date:  Unattended Estim: Shoulder, pre mod @ 80-150 Hz, 15 mins, Pain Hot Pack: Shoulder, 15 mins, Pain  PATIENT EDUCATION: Education details: As below. Person educated: Patient Education method: Customer service manager Education comprehension: verbalized understanding  HOME EXERCISE PROGRAM: [place] by Mali Applegate Feb 13th, 2024 View at [email or web address] using code: 73NARDE Total 1 Page 1 of 1 WAND EXTERNAL ROTATION - SUPINE ER Lie on your back holding a cane or wand with both hands.  On the affected side, place a small rolled up towel or pillow under your elbow. Maintain approx. 90 degree bend at the elbow with your arm approximately 30-45 degrees away from your side. GENTLE. PAIN-FREE Use your other arm to pull the wand/cane to rotate the affected arm back into a stretch. Hold and then  return to starting position and then repeat. Repeat 10 Times Hold 15 Seconds Complete 1 Set Perform 4 Times a Day  ASSESSMENT:  CLINICAL IMPRESSION: Treatment focused on manual therapy for reduced pain.  Familiar interventions were attempted, but she was unable to complete these interventions due to increased right shoulder pain.  Manual therapy focused on soft tissue mobilization to her right infraspinatus and scapular stabilizers.  She reported feeling better after manual therapy and modalities at the conclusion of treatment.  Recommend that she continue with skilled physical therapy to address her remaining impairments to maximize her functional mobility.  OBJECTIVE IMPAIRMENTS: decreased activity tolerance, decreased ROM, decreased strength, increased muscle spasms, and pain.   ACTIVITY LIMITATIONS: carrying, lifting, sleeping, and reach over head  PARTICIPATION LIMITATIONS: meal prep, cleaning, and laundry  REHAB POTENTIAL: Good  CLINICAL DECISION MAKING: Stable/uncomplicated  EVALUATION COMPLEXITY: Low   GOALS:  LONG TERM GOALS: Target date: 06/11/22  Ind with  an HEP. Goal status: INITIAL  2.  Active right shoulder flexion to 145 degrees so the patient can easily reach overhead. Goal status: INITIAL  3.  Active ER to 70 degrees+ to allow for easily donning/doffing of apparel. Goal status: INITIAL  4.  Increase ROM so patient is able to reach behind back to L3. Goal status: INITIAL  5.  Perform ADL's with pain not > 3/10. Goal status: INITIAL   PLAN:  PT FREQUENCY: 1-2x/week  PT DURATION: 6 weeks  PLANNED INTERVENTIONS: Therapeutic exercises, Therapeutic activity, Neuromuscular re-education, Patient/Family education, Self Care, Dry Needling, Electrical stimulation, Cryotherapy, Moist heat, Vasopneumatic device, Ultrasound, Ionotophoresis '4mg'$ /ml Dexamethasone, and Manual therapy  PLAN FOR NEXT SESSION: Review HEP, combo e'stim/US, STW/M, seated UE Ranger, wall  ladder, pulleys, supine cane exercises.   Darlin Coco, PT 04/18/2022, 3:05 PM

## 2022-04-24 ENCOUNTER — Ambulatory Visit: Payer: 59 | Admitting: Physical Therapy

## 2022-04-24 ENCOUNTER — Encounter: Payer: Self-pay | Admitting: Physical Therapy

## 2022-04-24 DIAGNOSIS — M25611 Stiffness of right shoulder, not elsewhere classified: Secondary | ICD-10-CM

## 2022-04-24 DIAGNOSIS — M25511 Pain in right shoulder: Secondary | ICD-10-CM | POA: Diagnosis not present

## 2022-04-24 NOTE — Therapy (Signed)
OUTPATIENT PHYSICAL THERAPY SHOULDER TREATMENT   Patient Name: Courtney Mcconnell MRN: [id number] DOB:10/09/1955, 67 y.o., female Today's Date: 04/24/2022  END OF SESSION:  PT End of Session - 04/24/22 1332     Visit Number 6    Number of Visits 12    Date for PT Re-Evaluation 06/11/22    Authorization Type FOTO AT LEAST EVERY 5TH VISIT.  PROGRESS NOTE AT 10TH VISIT.  KX MODIFIER AFTER 15 VISITS.    PT Start Time [id number]    PT Stop Time 1347    PT Time Calculation (min) 51 min    Activity Tolerance Patient tolerated treatment well    Behavior During Therapy WFL for tasks assessed/performed              Past Medical History:  Diagnosis Date   Asthma    Bursitis    Hip   CAD (coronary artery disease) 02/11/2019   PCI/DES x1 to mLAD, focal ostial 70% lesion in 1st diag   COPD (chronic obstructive pulmonary disease) (HCC)    Essential hypertension    GERD (gastroesophageal reflux disease)    Headache(784.0)    Hyperlipidemia    NSTEMI (non-ST elevated myocardial infarction) ([place]) 02/11/2019   Pneumonia    Seizure ([place])    Sleep apnea    Past Surgical History:  Procedure Laterality Date   ABDOMINAL HYSTERECTOMY     APPENDECTOMY     CATARACT EXTRACTION Bilateral    CHOLECYSTECTOMY     CORONARY STENT INTERVENTION N/A 02/11/2019   Procedure: CORONARY STENT INTERVENTION;  Surgeon: Burnell Blanks, MD;  [facility];; mid LAD 70% -- DES PCI (Synergy DES 2.5 x 28 --2.8 mm)   heal spur     right   INTRAVASCULAR PRESSURE WIRE/FFR STUDY N/A 02/11/2019   Procedure: INTRAVASCULAR PRESSURE WIRE/FFR STUDY;  Surgeon: Burnell Blanks, MD;  Location: [place] [facility];  Service: Cardiovascular;  Laterality: N/A;   LASER PHOTO ABLATION Right 08/27/2013   Procedure: LASER PHOTO ABLATION;  Surgeon: Hayden Pedro, MD;  Location: [place];  Service: Ophthalmology;  Laterality: Right;  Headscope laser AND Endolaser   LEFT HEART CATH AND CORONARY ANGIOGRAPHY N/A  02/11/2019   Procedure: LEFT HEART CATH AND CORONARY ANGIOGRAPHY;  Surgeon: Burnell Blanks, MD;  Location: [place] [facility];;; prox RCA 20%. Ost-prox Cx 20%. ostial D1 70% & mLAD 70% => (DES PCI of LAD)   MEMBRANE PEEL Right 08/27/2013   Procedure: MEMBRANE PEEL;  Surgeon: Hayden Pedro, MD;  Location: [place];  Service: Ophthalmology;  Laterality: Right;   PARS PLANA VITRECTOMY Right 08/27/2013   Procedure: PARS PLANA VITRECTOMY WITH 25 GAUGE;  Surgeon: Hayden Pedro, MD;  Location: [place];  Service: Ophthalmology;  Laterality: Right;   TOTAL KNEE ARTHROPLASTY Right 01/09/2021   Procedure: RIGHT TOTAL KNEE ARTHROPLASTY;  Surgeon: [name], MD;  Location: [place];  Service: Orthopedics;  Laterality: Right;  Needs RNFA   TOTAL KNEE ARTHROPLASTY Left 06/16/2021   Procedure: LEFT TOTAL KNEE ARTHROPLASTY;  Surgeon: [name], MD;  Location: [place];  Service: Orthopedics;  Laterality: Left;   Patient Active Problem List   Diagnosis Date Noted   S/P total knee arthroplasty, left 08/03/2021   History of DVT (deep vein thrombosis) 01/19/2021   S/P TKR (total knee replacement), right 01/09/2021   Rheumatoid arthritis with rheumatoid factor of multiple sites without organ or systems involvement ([place]) 12/02/2020   Coronary artery disease involving native coronary  artery of native heart with angina pectoris ([place]) 05/22/2019   Hyperlipidemia with target LDL less than 70 -> CAD 02/12/2019   Presence of drug coated stent in LAD coronary artery 02/11/2019   History of non-ST elevation myocardial infarction (NSTEMI) 02/10/2019   Cerebral infarction, remote, resolved 06/27/2018   Pulmonary nodules 12/08/2015   Sleep apnea 04/06/2015   Pulmonary emphysema ([place]) 03/02/2015   Seizure disorder ([place]) 01/21/2015   Focal epilepsy with impairment of consciousness ([place]) 12/13/2014   Essential hypertension 04/18/2014   Tobacco use disorder 07/19/2012     REFERRING PROVIDER: Darla Lesches  REFERRING  DIAG: Acute pain of right shoulder.  THERAPY DIAG:  Acute pain of right shoulder  Stiffness of right shoulder, not elsewhere classified  Rationale for Evaluation and Treatment: Rehabilitation  ONSET DATE: ~3 weeks.  SUBJECTIVE:                                                                                                                                                                                      SUBJECTIVE STATEMENT: Pain about a 7 today.  Though she states her shoulder is definitely better since starting PT. PERTINENT HISTORY: Bilateral TKA's, cardiac stents, H/A's, stents.  PAIN:  Are you having pain? Yes: NPRS scale: 7/10 Pain location: Right shoulder. Pain description: As above. Aggravating factors: Movement. Relieving factors: As above.  PRECAUTIONS: None  WEIGHT BEARING RESTRICTIONS: No  FALLS:  Has patient fallen in last 6 months? No  LIVING ENVIRONMENT: Lives with: lives with their family Lives in: House/apartment Has following equipment at home: None  OCCUPATION: Retired.  PLOF: Independent  PATIENT GOALS:Get out of pain.  NEXT MD VISIT:   OBJECTIVE:    PATIENT SURVEYS:  FOTO 24.   POSTURE: Guarded with rounded right shoulder.  UPPER EXTREMITY ROM:   Very limited active antigravity right shoulder movement due to pain.  Passively to 125 degrees of flexion, ER to 45 degrees and attempt at behind back to right hip.  UPPER EXTREMITY MMT:  Difficult to accurately due to pain but she was able to hold her right shoulder against gravity when brought there passively.  IR/ER with elbow near side is ~4-/5.  Grip strength:   Right: 5#  Left : 8#   SHOULDER SPECIAL TESTS: Positive right shoulder impingement test.  (-) Drop Arm test.  PALPATION:  Very tender to palpation over patient's left bicipital groove and posterior cuff musculature.   TODAY'S TREATMENT:  DATE:                                                                                       04/23/22:    Trigger Point Dry-Needling  Treatment instructions: Expect mild to moderate muscle soreness. S/S of pneumothorax if dry needled over a lung field, and to seek immediate medical attention should they occur. Patient verbalized understanding of these instructions and education.  Patient Consent Given: Yes Education handout provided: Yes Muscles treated: Left posterior cuff/Infraspinatus F/b combo e'stim/US at 1.50 W/CM2 x 12 minutes f/b STW/M x 11 minutes with ischemic release technique to decrease tone f/b  Unattended Estim: Shoulder, pre mod @ 80-150 Hz, 15 mins, Pain Hot Pack: Shoulder, 15 mins, Pain  PATIENT EDUCATION: Education details: As below. Person educated: Patient Education method: Customer service manager Education comprehension: verbalized understanding  HOME EXERCISE PROGRAM: [place] by Mali Loria Lacina Feb 13th, 2024 View at [email or web address] using code: [email or web address] Total 1 Page 1 of 1 WAND EXTERNAL ROTATION - SUPINE ER Lie on your back holding a cane or wand with both hands.  On the affected side, place a small rolled up towel or pillow under your elbow. Maintain approx. 90 degree bend at the elbow with your arm approximately 30-45 degrees away from your side. GENTLE. PAIN-FREE Use your other arm to pull the wand/cane to rotate the affected arm back into a stretch. Hold and then return to starting position and then repeat. Repeat 10 Times Hold 15 Seconds Complete 1 Set Perform 4 Times a Day  ASSESSMENT:  CLINICAL IMPRESSION:  Good response with dry needling and FOTO score improved to 50.  Felt better after treatment.  OBJECTIVE IMPAIRMENTS: decreased activity tolerance, decreased ROM, decreased strength, increased muscle spasms, and pain.   ACTIVITY LIMITATIONS:  carrying, lifting, sleeping, and reach over head  PARTICIPATION LIMITATIONS: meal prep, cleaning, and laundry  REHAB POTENTIAL: Good  CLINICAL DECISION MAKING: Stable/uncomplicated  EVALUATION COMPLEXITY: Low   GOALS:  LONG TERM GOALS: Target date: 06/11/22  Ind with an HEP. Goal status: INITIAL  2.  Active right shoulder flexion to 145 degrees so the patient can easily reach overhead. Goal status: INITIAL  3.  Active ER to 70 degrees+ to allow for easily donning/doffing of apparel. Goal status: INITIAL  4.  Increase ROM so patient is able to reach behind back to L3. Goal status: INITIAL  5.  Perform ADL's with pain not > 3/10. Goal status: INITIAL   PLAN:  PT FREQUENCY: 1-2x/week  PT DURATION: 6 weeks  PLANNED INTERVENTIONS: Therapeutic exercises, Therapeutic activity, Neuromuscular re-education, Patient/Family education, Self Care, Dry Needling, Electrical stimulation, Cryotherapy, Moist heat, Vasopneumatic device, Ultrasound, Ionotophoresis '4mg'$ /ml Dexamethasone, and Manual therapy  PLAN FOR NEXT SESSION: Review HEP, combo e'stim/US, STW/M, seated UE Ranger, wall ladder, pulleys, supine cane exercises.   Salvatrice Morandi, [place], PT 04/24/2022, 3:55 PM

## 2022-05-02 ENCOUNTER — Ambulatory Visit: Payer: 59

## 2022-05-02 DIAGNOSIS — M25611 Stiffness of right shoulder, not elsewhere classified: Secondary | ICD-10-CM

## 2022-05-02 DIAGNOSIS — M25511 Pain in right shoulder: Secondary | ICD-10-CM | POA: Diagnosis not present

## 2022-05-02 NOTE — Therapy (Signed)
OUTPATIENT PHYSICAL THERAPY SHOULDER TREATMENT   Patient Name: Courtney Mcconnell MRN: [id number] DOB:[birth year], 67 y.o., female Today's Date: 05/02/2022  END OF SESSION:  PT End of Session - 05/02/22 1350     Visit Number 7    Number of Visits 12    Date for PT Re-Evaluation 06/11/22    Authorization Type FOTO AT LEAST EVERY 5TH VISIT.  PROGRESS NOTE AT 10TH VISIT.  KX MODIFIER AFTER 15 VISITS.    PT Start Time 1347    PT Stop Time 1429    PT Time Calculation (min) 42 min    Activity Tolerance Patient tolerated treatment well    Behavior During Therapy WFL for tasks assessed/performed              Past Medical History:  Diagnosis Date   Asthma    Bursitis    Hip   CAD (coronary artery disease) 02/11/2019   PCI/DES x1 to mLAD, focal ostial 70% lesion in 1st diag   COPD (chronic obstructive pulmonary disease) (HCC)    Essential hypertension    GERD (gastroesophageal reflux disease)    Headache(784.0)    Hyperlipidemia    NSTEMI (non-ST elevated myocardial infarction) ([place]) 02/11/2019   Pneumonia    Seizure ([place])    Sleep apnea    Past Surgical History:  Procedure Laterality Date   ABDOMINAL HYSTERECTOMY     APPENDECTOMY     CATARACT EXTRACTION Bilateral    CHOLECYSTECTOMY     CORONARY STENT INTERVENTION N/A 02/11/2019   Procedure: CORONARY STENT INTERVENTION;  Surgeon: Burnell Blanks, MD;  Location: [facility];; mid LAD 70% -- DES PCI (Synergy DES 2.5 x 28 --2.8 mm)   heal spur     right   INTRAVASCULAR PRESSURE WIRE/FFR STUDY N/A 02/11/2019   Procedure: INTRAVASCULAR PRESSURE WIRE/FFR STUDY;  Surgeon: Burnell Blanks, MD;  Location: [facility];  Service: Cardiovascular;  Laterality: N/A;   LASER PHOTO ABLATION Right 08/27/2013   Procedure: LASER PHOTO ABLATION;  Surgeon: Hayden Pedro, MD;  Location: [facility];  Service: Ophthalmology;  Laterality: Right;  Headscope laser AND Endolaser   LEFT HEART CATH AND CORONARY ANGIOGRAPHY N/A  02/11/2019   Procedure: LEFT HEART CATH AND CORONARY ANGIOGRAPHY;  Surgeon: Burnell Blanks, MD;  Location: [facility];;; prox RCA 20%. Ost-prox Cx 20%. ostial D1 70% & mLAD 70% => (DES PCI of LAD)   MEMBRANE PEEL Right 08/27/2013   Procedure: MEMBRANE PEEL;  Surgeon: Hayden Pedro, MD;  Location: [facility];  Service: Ophthalmology;  Laterality: Right;   PARS PLANA VITRECTOMY Right 08/27/2013   Procedure: PARS PLANA VITRECTOMY WITH 25 GAUGE;  Surgeon: Hayden Pedro, MD;  Location: [facility];  Service: Ophthalmology;  Laterality: Right;   TOTAL KNEE ARTHROPLASTY Right 01/09/2021   Procedure: RIGHT TOTAL KNEE ARTHROPLASTY;  Surgeon: [name], MD;  Location: [facility];  Service: Orthopedics;  Laterality: Right;  Needs RNFA   TOTAL KNEE ARTHROPLASTY Left 06/16/2021   Procedure: LEFT TOTAL KNEE ARTHROPLASTY;  Surgeon: [name], MD;  Location: [facility];  Service: Orthopedics;  Laterality: Left;   Patient Active Problem List   Diagnosis Date Noted   S/P total knee arthroplasty, left 08/03/2021   History of DVT (deep vein thrombosis) 01/19/2021   S/P TKR (total knee replacement), right 01/09/2021   Rheumatoid arthritis with rheumatoid factor of multiple sites without organ or systems involvement ([place]) 12/02/2020   Coronary artery disease involving native coronary  artery of native heart with angina pectoris ([place]) 05/22/2019   Hyperlipidemia with target LDL less than 70 -> CAD 02/12/2019   Presence of drug coated stent in LAD coronary artery 02/11/2019   History of non-ST elevation myocardial infarction (NSTEMI) 02/10/2019   Cerebral infarction, remote, resolved 06/27/2018   Pulmonary nodules 12/08/2015   Sleep apnea 04/06/2015   Pulmonary emphysema ([place]) 03/02/2015   Seizure disorder ([place]) 01/21/2015   Focal epilepsy with impairment of consciousness ([place]) 12/13/2014   Essential hypertension 04/18/2014   Tobacco use disorder 07/19/2012     REFERRING PROVIDER: Darla Lesches  REFERRING  DIAG: Acute pain of right shoulder.  THERAPY DIAG:  Acute pain of right shoulder  Stiffness of right shoulder, not elsewhere classified  Rationale for Evaluation and Treatment: Rehabilitation  ONSET DATE: ~3 weeks.  SUBJECTIVE:                                                                                                                                                                                      SUBJECTIVE STATEMENT: Patient reports that she is still hurting today. She feels like her shoulder will not let up and she does not no why.  PERTINENT HISTORY: Bilateral TKA's, cardiac stents, H/A's, stents.  PAIN:  Are you having pain? Yes: NPRS scale: 9/10 Pain location: Right shoulder. Pain description: As above. Aggravating factors: Movement. Relieving factors: As above.  PRECAUTIONS: None  WEIGHT BEARING RESTRICTIONS: No  FALLS:  Has patient fallen in last 6 months? No  LIVING ENVIRONMENT: Lives with: lives with their family Lives in: House/apartment Has following equipment at home: None  OCCUPATION: Retired.  PLOF: Independent  PATIENT GOALS:Get out of pain.  NEXT MD VISIT:   OBJECTIVE:    PATIENT SURVEYS:  FOTO 24.   POSTURE: Guarded with rounded right shoulder.  UPPER EXTREMITY ROM:   Very limited active antigravity right shoulder movement due to pain.  Passively to 125 degrees of flexion, ER to 45 degrees and attempt at behind back to right hip.  UPPER EXTREMITY MMT:  Difficult to accurately due to pain but she was able to hold her right shoulder against gravity when brought there passively.  IR/ER with elbow near side is ~4-/5.  Grip strength:   Right: 5#  Left : 8#   SHOULDER SPECIAL TESTS: Positive right shoulder impingement test.  (-) Drop Arm test.  PALPATION:  Very tender to palpation over patient's left bicipital groove and posterior cuff musculature.   TODAY'S TREATMENT:  DATE:                                                                                       3/20 EXERCISE LOG  Exercise Repetitions and Resistance Comments  Pulleys  3.5 minutes   UE ranger  2 minutes  Multidirectional   Isometric biceps flexion  20 reps w/ 5 second hold             Blank cell = exercise not performed today  Manual Therapy Soft Tissue Mobilization: right biceps, for reduced pain and tone   Modalities  Date:  Unattended Estim: right bicep, pre mod @ 80-150 Hz , 15 mins, Pain and Tone Hot Pack: Shoulder, 15 mins, Pain and Tone  PATIENT EDUCATION: Education details: As below. Person educated: Patient Education method: Customer service manager Education comprehension: verbalized understanding  HOME EXERCISE PROGRAM: Cassoday by Mali Applegate Feb 13th, 2024 View at [email or web address] using code: [id number] Total 1 Page 1 of 1 WAND EXTERNAL ROTATION - SUPINE ER Lie on your back holding a cane or wand with both hands.  On the affected side, place a small rolled up towel or pillow under your elbow. Maintain approx. 90 degree bend at the elbow with your arm approximately 30-45 degrees away from your side. GENTLE. PAIN-FREE Use your other arm to pull the wand/cane to rotate the affected arm back into a stretch. Hold and then return to starting position and then repeat. Repeat 10 Times Hold 15 Seconds Complete 1 Set Perform 4 Times a Day  ASSESSMENT:  CLINICAL IMPRESSION:   Patient presented to treatment with increased right arm pain. She was attempted to be introduced to isometric biceps engagement, but she experienced a moderate increase in forearm pain with this intervention. However, this was able to be reduced with manual therapy to her right biceps followed by electrical stimulation and moist heat to the area. She reported feeling better upon the  conclusion of treatment. She continues to require skilled physical therapy to address her remaining impairments to maximize her functional mobility.   OBJECTIVE IMPAIRMENTS: decreased activity tolerance, decreased ROM, decreased strength, increased muscle spasms, and pain.   ACTIVITY LIMITATIONS: carrying, lifting, sleeping, and reach over head  PARTICIPATION LIMITATIONS: meal prep, cleaning, and laundry  REHAB POTENTIAL: Good  CLINICAL DECISION MAKING: Stable/uncomplicated  EVALUATION COMPLEXITY: Low   GOALS:  LONG TERM GOALS: Target date: 06/11/22  Ind with an HEP. Goal status: INITIAL  2.  Active right shoulder flexion to 145 degrees so the patient can easily reach overhead. Goal status: INITIAL  3.  Active ER to 70 degrees+ to allow for easily donning/doffing of apparel. Goal status: INITIAL  4.  Increase ROM so patient is able to reach behind back to L3. Goal status: INITIAL  5.  Perform ADL's with pain not > 3/10. Goal status: INITIAL   PLAN:  PT FREQUENCY: 1-2x/week  PT DURATION: 6 weeks  PLANNED INTERVENTIONS: Therapeutic exercises, Therapeutic activity, Neuromuscular re-education, Patient/Family education, Self Care, Dry Needling, Electrical stimulation, Cryotherapy, Moist heat, Vasopneumatic device, Ultrasound, Ionotophoresis 4mg /ml Dexamethasone, and Manual therapy  PLAN FOR NEXT SESSION: Review HEP, combo e'stim/US, STW/M, seated  UE Ranger, wall ladder, pulleys, supine cane exercises.   Darlin Coco, PT 05/02/2022, 2:50 PM

## 2022-05-03 ENCOUNTER — Other Ambulatory Visit: Payer: Self-pay | Admitting: Family Medicine

## 2022-05-03 DIAGNOSIS — J439 Emphysema, unspecified: Secondary | ICD-10-CM

## 2022-05-08 ENCOUNTER — Other Ambulatory Visit: Payer: Self-pay | Admitting: Cardiology

## 2022-05-23 ENCOUNTER — Encounter: Payer: Self-pay | Admitting: Family Medicine

## 2022-05-23 ENCOUNTER — Ambulatory Visit (INDEPENDENT_AMBULATORY_CARE_PROVIDER_SITE_OTHER): Payer: 59 | Admitting: Family Medicine

## 2022-05-23 VITALS — BP 144/79 | HR 67 | Ht 66.0 in | Wt 229.0 lb

## 2022-05-23 DIAGNOSIS — I10 Essential (primary) hypertension: Secondary | ICD-10-CM | POA: Diagnosis not present

## 2022-05-23 DIAGNOSIS — J439 Emphysema, unspecified: Secondary | ICD-10-CM | POA: Diagnosis not present

## 2022-05-23 DIAGNOSIS — E1169 Type 2 diabetes mellitus with other specified complication: Secondary | ICD-10-CM

## 2022-05-23 DIAGNOSIS — I25119 Atherosclerotic heart disease of native coronary artery with unspecified angina pectoris: Secondary | ICD-10-CM

## 2022-05-23 DIAGNOSIS — J449 Chronic obstructive pulmonary disease, unspecified: Secondary | ICD-10-CM

## 2022-05-23 DIAGNOSIS — E785 Hyperlipidemia, unspecified: Secondary | ICD-10-CM

## 2022-05-23 LAB — BAYER DCA HB A1C WAIVED: HB A1C (BAYER DCA - WAIVED): 6.7 % — ABNORMAL HIGH (ref 4.8–5.6)

## 2022-05-23 MED ORDER — ALBUTEROL SULFATE HFA 108 (90 BASE) MCG/ACT IN AERS: INHALATION_SPRAY | RESPIRATORY_TRACT | 3 refills | Status: DC

## 2022-05-23 MED ORDER — FLUTICASONE-SALMETEROL 100-50 MCG/ACT IN AEPB: 1.0000 | INHALATION_SPRAY | Freq: Two times a day (BID) | RESPIRATORY_TRACT | 3 refills | Status: DC

## 2022-05-23 NOTE — Progress Notes (Signed)
BP (!) 144/79   Pulse 67   Ht 5\' 6"  (1.676 m)   Wt 229 lb (103.9 kg)   SpO2 96%   BMI 36.96 kg/m    Subjective:   Patient ID: Courtney Mcconnell, female    DOB: 03/13/1955, 67 y.o.   MRN: [id number]  HPI: Courtney Mcconnell is a 67 y.o. female presenting on 05/23/2022 for Medical Management of Chronic Issues, Hyperlipidemia, and Hypertension   HPI Hypertension Patient is currently on losartan and carvedilol, and their blood pressure today is 144/79. Patient denies any lightheadedness or dizziness. Patient denies headaches, blurred vision, chest pains, shortness of breath, or weakness. Denies any side effects from medication and is content with current medication.   Hyperlipidemia CAD recheck Patient is coming in for recheck of his hyperlipidemia. The patient is currently taking atorvastatin. They deny any issues with myalgias or history of liver damage from it. They deny any focal numbness or weakness or chest pain.   COPD Patient is coming in for COPD recheck today.  He is currently on advair and spiriva and albuterol.  He has a mild chronic cough but denies any major coughing spells or wheezing spells.  He has 0 nighttime symptoms per week and 0 daytime symptoms per week currently.   Relevant past medical, surgical, family and social history reviewed and updated as indicated. Interim medical history since our last visit reviewed. Allergies and medications reviewed and updated.  Review of Systems  Constitutional:  Negative for chills and fever.  HENT:  Negative for congestion, ear discharge and ear pain.   Eyes:  Negative for redness and visual disturbance.  Respiratory:  Negative for cough, chest tightness, shortness of breath and wheezing.   Cardiovascular:  Negative for chest pain and leg swelling.  Genitourinary:  Negative for difficulty urinating and dysuria.  Musculoskeletal:  Negative for back pain and gait problem.  Skin:  Negative for rash.  Neurological:  Negative for  light-headedness and headaches.  Psychiatric/Behavioral:  Negative for agitation and behavioral problems.   All other systems reviewed and are negative.   Per HPI unless specifically indicated above   Allergies as of 05/23/2022       Reactions   Penicillins Hives   Bee Venom Swelling   Prednisone Other (See Comments)   Patient did not like how it made her feel, caused jittery feeling.    Augmentin [amoxicillin-pot Clavulanate] Rash   Doxycycline Hyclate Itching, Rash        Medication List        Accurate as of May 23, 2022  1:58 PM. If you have any questions, ask your nurse or doctor.          albuterol 108 (90 Base) MCG/ACT inhaler Commonly known as: VENTOLIN HFA INHALE TWO PUFFS BY MOUTH EVERY 6 HOURS AS NEEDED FOR WHEEZING OR SHORTNESS OF BREATH   aspirin EC 81 MG tablet Take 81 mg by mouth daily. Swallow whole.   atorvastatin 40 MG tablet Commonly known as: LIPITOR TAKE 1 TABLET BY MOUTH EVERY DAY AT SIX IN THE EVENING   carvedilol 3.125 MG tablet Commonly known as: COREG TAKE 1 TABLET BY MOUTH TWICE DAILY WITH A MEAL   cetirizine 10 MG tablet Commonly known as: ZyrTEC Allergy Take 1 tablet (10 mg total) by mouth daily for 14 days.   diclofenac Sodium 1 % Gel Commonly known as: Voltaren Apply 2 g topically 4 (four) times daily.   fluticasone-salmeterol 100-50 MCG/ACT Aepb Commonly known as: ADVAIR  Inhale 1 puff into the lungs 2 (two) times daily.   losartan 25 MG tablet Commonly known as: COZAAR TAKE 1 TABLET BY MOUTH EVERY DAY   mupirocin ointment 2 % Commonly known as: BACTROBAN Apply 1 Application topically 2 (two) times daily.   nitroGLYCERIN 0.4 MG SL tablet Commonly known as: NITROSTAT PLACE ONE TABLET UNDER THE TONGUE EVERY FIVE MINUTES AS NEEDED FOR CHEST PAIN   omeprazole 20 MG tablet Commonly known as: PRILOSEC OTC Take 20 mg by mouth 2 (two) times daily.   Spiriva HandiHaler 18 MCG inhalation capsule Generic drug:  tiotropium place 1 CAPSULE into inhaler AND INHALE DAILY   Vimpat 150 MG Tabs Generic drug: Lacosamide Take 300 mg by mouth 2 (two) times daily.   zonisamide 100 MG capsule Commonly known as: ZONEGRAN Take 200 mg by mouth at bedtime.         Objective:   BP (!) 144/79   Pulse 67   Ht 5\' 6"  (1.676 m)   Wt 229 lb (103.9 kg)   SpO2 96%   BMI 36.96 kg/m   Wt Readings from Last 3 Encounters:  05/23/22 229 lb (103.9 kg)  03/02/22 227 lb 12.8 oz (103.3 kg)  02/27/22 226 lb (102.5 kg)    Physical Exam Vitals and nursing note reviewed.  Constitutional:      General: She is not in acute distress.    Appearance: She is well-developed. She is not diaphoretic.  Eyes:     Conjunctiva/sclera: Conjunctivae normal.  Cardiovascular:     Rate and Rhythm: Normal rate and regular rhythm.     Heart sounds: Normal heart sounds. No murmur heard. Pulmonary:     Effort: Pulmonary effort is normal. No respiratory distress.     Breath sounds: Normal breath sounds. No wheezing.  Musculoskeletal:        General: No swelling or tenderness. Normal range of motion.  Skin:    General: Skin is warm and dry.     Findings: No rash.  Neurological:     Mental Status: She is alert and oriented to person, place, and time.     Coordination: Coordination normal.  Psychiatric:        Behavior: Behavior normal.       Assessment & Plan:   Problem List Items Addressed This Visit       Cardiovascular and Mediastinum   Essential hypertension (Chronic)   Coronary artery disease involving native coronary artery of native heart with angina pectoris (Chronic)     Respiratory   Pulmonary emphysema   Relevant Medications   albuterol (VENTOLIN HFA) 108 (90 Base) MCG/ACT inhaler   fluticasone-salmeterol (ADVAIR) 100-50 MCG/ACT AEPB     Other   Hyperlipidemia with target LDL less than 70 -> CAD - Primary (Chronic)   Relevant Orders   CBC with Differential/Platelet   CMP14+EGFR   Lipid panel    Bayer DCA Hb A1c Waived   Other Visit Diagnoses     Type 2 diabetes mellitus with other specified complication, without long-term current use of insulin       Relevant Orders   CBC with Differential/Platelet   CMP14+EGFR   Lipid panel   Bayer DCA Hb A1c Waived   Microalbumin / creatinine urine ratio       A1c is good at 6.7 and bp slightly up, monitor at home Follow up plan: Return in about 6 months (around 11/22/2022), or if symptoms worsen or fail to improve, for Hypertension and hyperlipidemia .  Counseling provided for all of the vaccine components Orders Placed This Encounter  Procedures   CBC with Differential/Platelet   CMP14+EGFR   Lipid panel   Bayer DCA Hb A1c Waived   Microalbumin / creatinine urine ratio    Arville Care, MD [facility] 05/23/2022, 1:58 PM

## 2022-05-24 LAB — CMP14+EGFR
ALT: 6 IU/L (ref 0–32)
AST: 17 IU/L (ref 0–40)
Albumin/Globulin Ratio: 1.5 (ref 1.2–2.2)
Albumin: 3.8 g/dL — ABNORMAL LOW (ref 3.9–4.9)
Alkaline Phosphatase: 141 IU/L — ABNORMAL HIGH (ref 44–121)
BUN/Creatinine Ratio: 13 (ref 12–28)
BUN: 9 mg/dL (ref 8–27)
Bilirubin Total: 0.3 mg/dL (ref 0.0–1.2)
CO2: 21 mmol/L (ref 20–29)
Calcium: 8.8 mg/dL (ref 8.7–10.3)
Chloride: 106 mmol/L (ref 96–106)
Creatinine, Ser: 0.7 mg/dL (ref 0.57–1.00)
Globulin, Total: 2.5 g/dL (ref 1.5–4.5)
Glucose: 154 mg/dL — ABNORMAL HIGH (ref 70–99)
Potassium: 3.8 mmol/L (ref 3.5–5.2)
Sodium: 141 mmol/L (ref 134–144)
Total Protein: 6.3 g/dL (ref 6.0–8.5)
eGFR: 95 mL/min/{1.73_m2} (ref >59)

## 2022-05-24 LAB — CBC WITH DIFFERENTIAL/PLATELET
Basophils Absolute: 0 10*3/uL (ref 0.0–0.2)
Basos: 1 %
EOS (ABSOLUTE): 0.2 10*3/uL (ref 0.0–0.4)
Eos: 3 %
Hematocrit: 38.4 % (ref 34.0–46.6)
Hemoglobin: 12.2 g/dL (ref 11.1–15.9)
Immature Grans (Abs): 0 10*3/uL (ref 0.0–0.1)
Immature Granulocytes: 0 %
Lymphocytes Absolute: 2.1 10*3/uL (ref 0.7–3.1)
Lymphs: 34 %
MCH: 31 pg (ref 26.6–33.0)
MCHC: 31.8 g/dL (ref 31.5–35.7)
MCV: 98 fL — ABNORMAL HIGH (ref 79–97)
Monocytes Absolute: 0.6 10*3/uL (ref 0.1–0.9)
Monocytes: 9 %
Neutrophils Absolute: 3.3 10*3/uL (ref 1.4–7.0)
Neutrophils: 53 %
Platelets: 220 10*3/uL (ref 150–450)
RBC: 3.94 x10E6/uL (ref 3.77–5.28)
RDW: 12.1 % (ref 11.7–15.4)
WBC: 6.1 10*3/uL (ref 3.4–10.8)

## 2022-05-24 LAB — LIPID PANEL
Chol/HDL Ratio: 2.9 ratio (ref 0.0–4.4)
Cholesterol, Total: 132 mg/dL (ref 100–199)
HDL: 46 mg/dL (ref >39)
LDL Chol Calc (NIH): 67 mg/dL (ref 0–99)
Triglycerides: 105 mg/dL (ref 0–149)
VLDL Cholesterol Cal: 19 mg/dL (ref 5–40)

## 2022-05-24 LAB — MICROALBUMIN / CREATININE URINE RATIO
Creatinine, Urine: 41.7 mg/dL
Microalb/Creat Ratio: <7 mg/g creat (ref 0–29)
Microalbumin, Urine: <3 ug/mL

## 2022-08-22 ENCOUNTER — Ambulatory Visit: Payer: 59 | Admitting: Family Medicine

## 2022-09-03 ENCOUNTER — Telehealth: Payer: Self-pay | Admitting: *Deleted

## 2022-09-03 NOTE — Telephone Encounter (Signed)
Ortho bundle 1 year call completed. ?

## 2022-09-04 ENCOUNTER — Ambulatory Visit: Payer: Medicare HMO | Attending: Cardiology | Admitting: Cardiology

## 2022-09-04 ENCOUNTER — Encounter: Payer: Self-pay | Admitting: Cardiology

## 2022-09-04 VITALS — BP 138/80 | HR 60 | Ht 66.0 in | Wt 235.8 lb

## 2022-09-04 DIAGNOSIS — E782 Mixed hyperlipidemia: Secondary | ICD-10-CM

## 2022-09-04 DIAGNOSIS — I10 Essential (primary) hypertension: Secondary | ICD-10-CM | POA: Diagnosis not present

## 2022-09-04 DIAGNOSIS — I25119 Atherosclerotic heart disease of native coronary artery with unspecified angina pectoris: Secondary | ICD-10-CM

## 2022-09-04 NOTE — Patient Instructions (Addendum)

## 2022-09-04 NOTE — Progress Notes (Signed)
    Cardiology Office Note  Date: 09/04/2022   ID: Courtney, Mcconnell April 14, 1955, MRN [id number]  History of Present Illness: Courtney Mcconnell is a 67 y.o. female last seen in January by Ms. Philis Nettle NP, I reviewed the note.  She is here with her daughter for a follow-up visit.  Reports no increasing angina, has used only a few nitroglycerin in the interim mainly during times of emotional upset and stress.  I reviewed her medications which are stable from a cardiac perspective.  I also went over her interval lab work.  LDL was down to 67 in April.  She has had no intolerances to Lipitor.  ECG today shows sinus rhythm with lead motion artifact.  Physical Exam: VS:  BP 138/80   Pulse 60   Ht 5\' 6"  (1.676 m)   Wt 235 lb 12.8 oz (107 kg)   SpO2 97%   BMI 38.06 kg/m , BMI Body mass index is 38.06 kg/m.  Wt Readings from Last 3 Encounters:  09/04/22 235 lb 12.8 oz (107 kg)  05/23/22 229 lb (103.9 kg)  03/02/22 227 lb 12.8 oz (103.3 kg)    General: Patient appears comfortable at rest. HEENT: Conjunctiva and lids normal. Neck: Supple, no elevated JVP or carotid bruits. Lungs: Clear to auscultation, nonlabored breathing at rest. Cardiac: Regular rate and rhythm, no S3 or significant systolic murmur. Extremities: No pitting edema.  ECG:  An ECG dated 08/24/2021 was personally reviewed today and demonstrated:  Sinus rhythm with leftward axis and nonspecific T wave changes.  Labwork: 05/23/2022: ALT 6; AST 17; BUN 9; Creatinine, Ser 0.70; Hemoglobin 12.2; Platelets 220; Potassium 3.8; Sodium 141     Component Value Date/Time   CHOL 132 05/23/2022 1352   TRIG 105 05/23/2022 1352   HDL 46 05/23/2022 1352   CHOLHDL 2.9 05/23/2022 1352   CHOLHDL 3.9 02/11/2019 0211   VLDL 12 02/11/2019 0211   LDLCALC 67 05/23/2022 1352   LDLDIRECT 151 (H) 07/06/2015 1604   Other Studies Reviewed Today:  No interval cardiac testing for review today.  Assessment and Plan:  1.  CAD status post DES to  the mid LAD with medically managed moderate first diagonal stenosis as of 2020.  Follow-up Lexiscan Myoview in August 2022 was low risk, no significant ischemia with LVEF 66%.  Plan to continue medical therapy and observation.  ECG reviewed and stable.  Continue aspirin, Lipitor, Coreg, losartan, and as needed nitroglycerin.  2.  Essential hypertension.  Systolics in the 130s today.  No changes made to current regimen.  3.  Mixed hyperlipidemia.  LDL 67 in April.  Continue Lipitor.  4.  OSA on CPAP.  Disposition:  Follow up  6 months.  Signed, Jonelle Sidle, M.D., F.A.C.C. [facility]

## 2022-09-15 IMAGING — DX DG CHEST 2V
2 series · 2 of 2 positions shown · non-contrast
Comparison: Chest radiograph 04/06/2021

CLINICAL DATA: Preoperative evaluation

EXAM:
CHEST - 2 VIEW

[w chest pa]
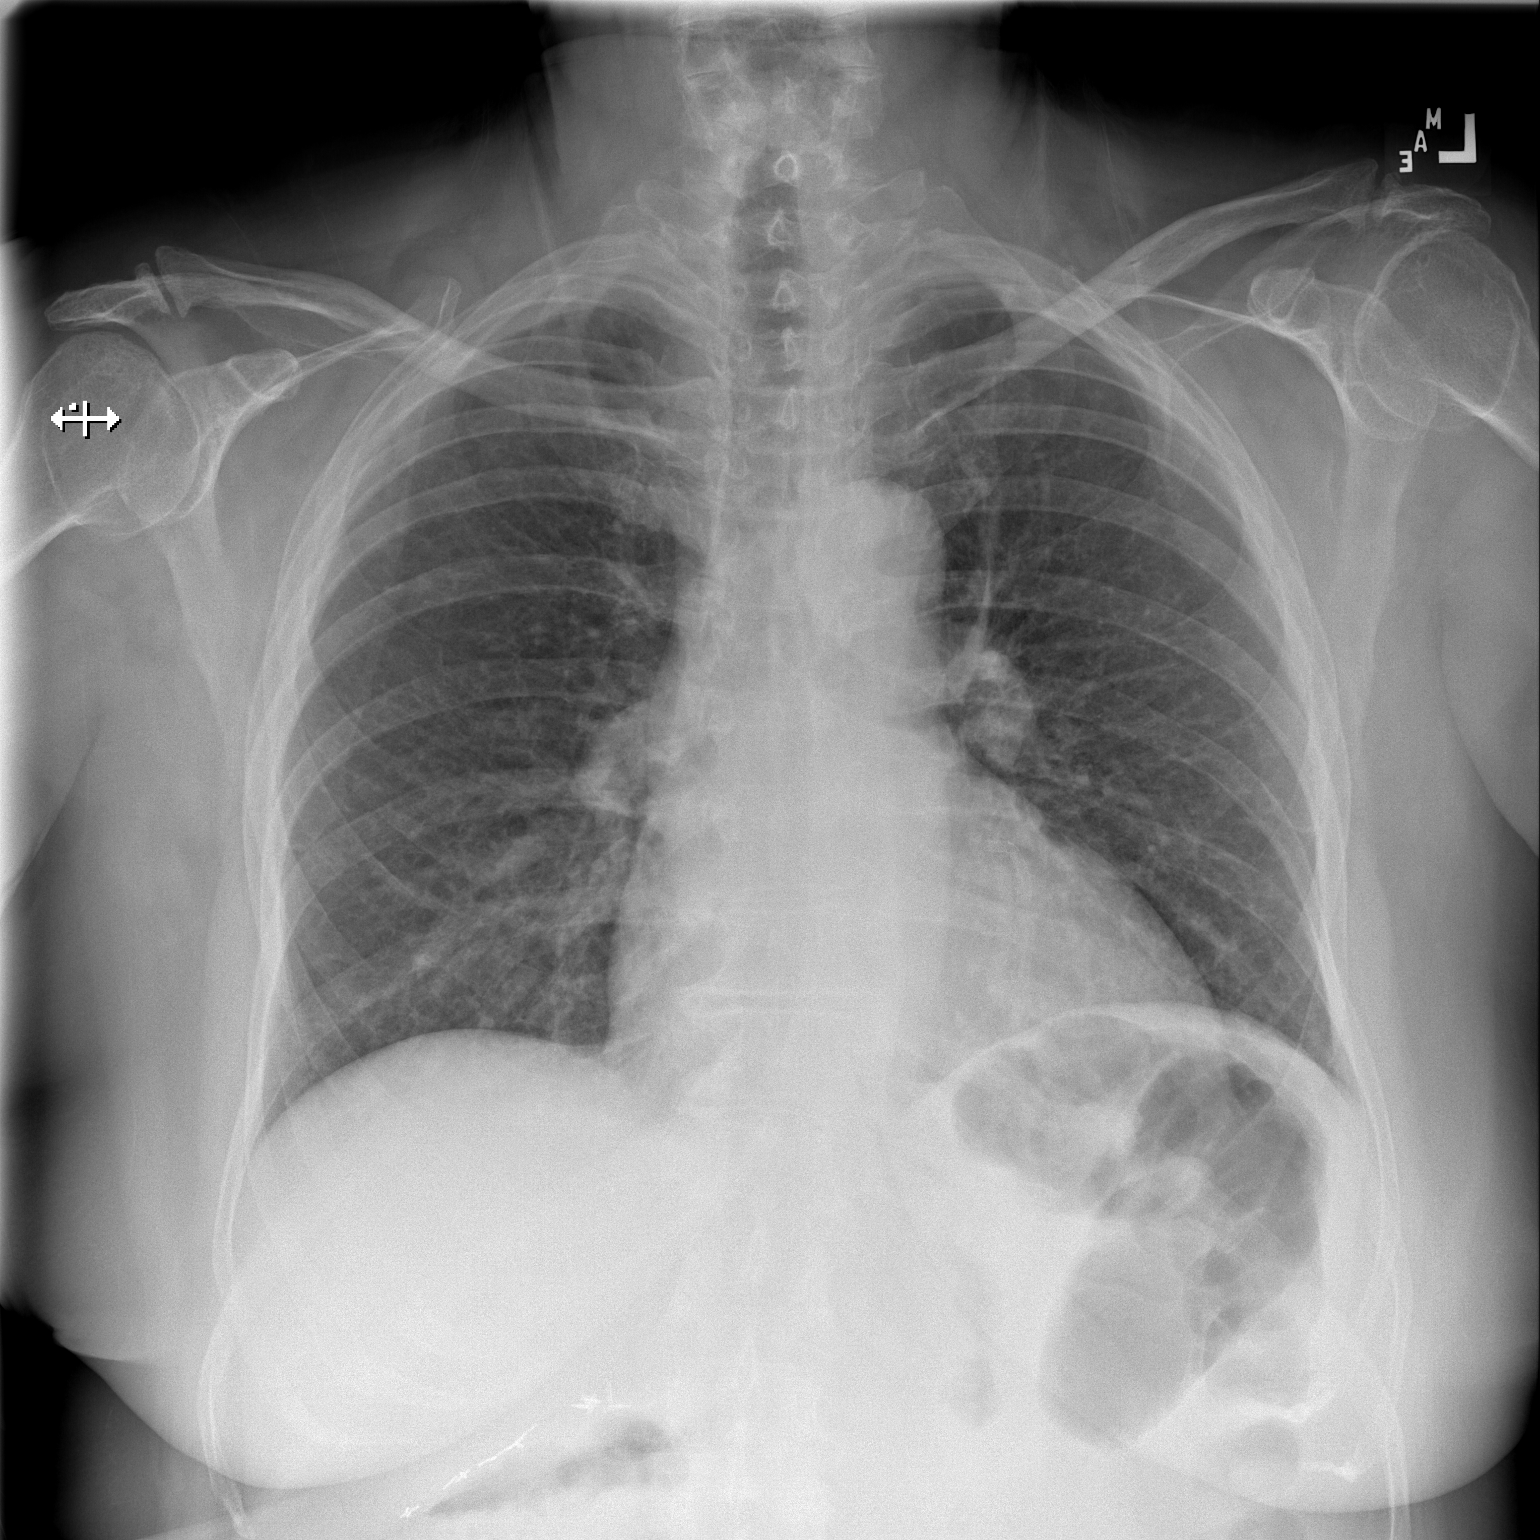

[w chest lat]
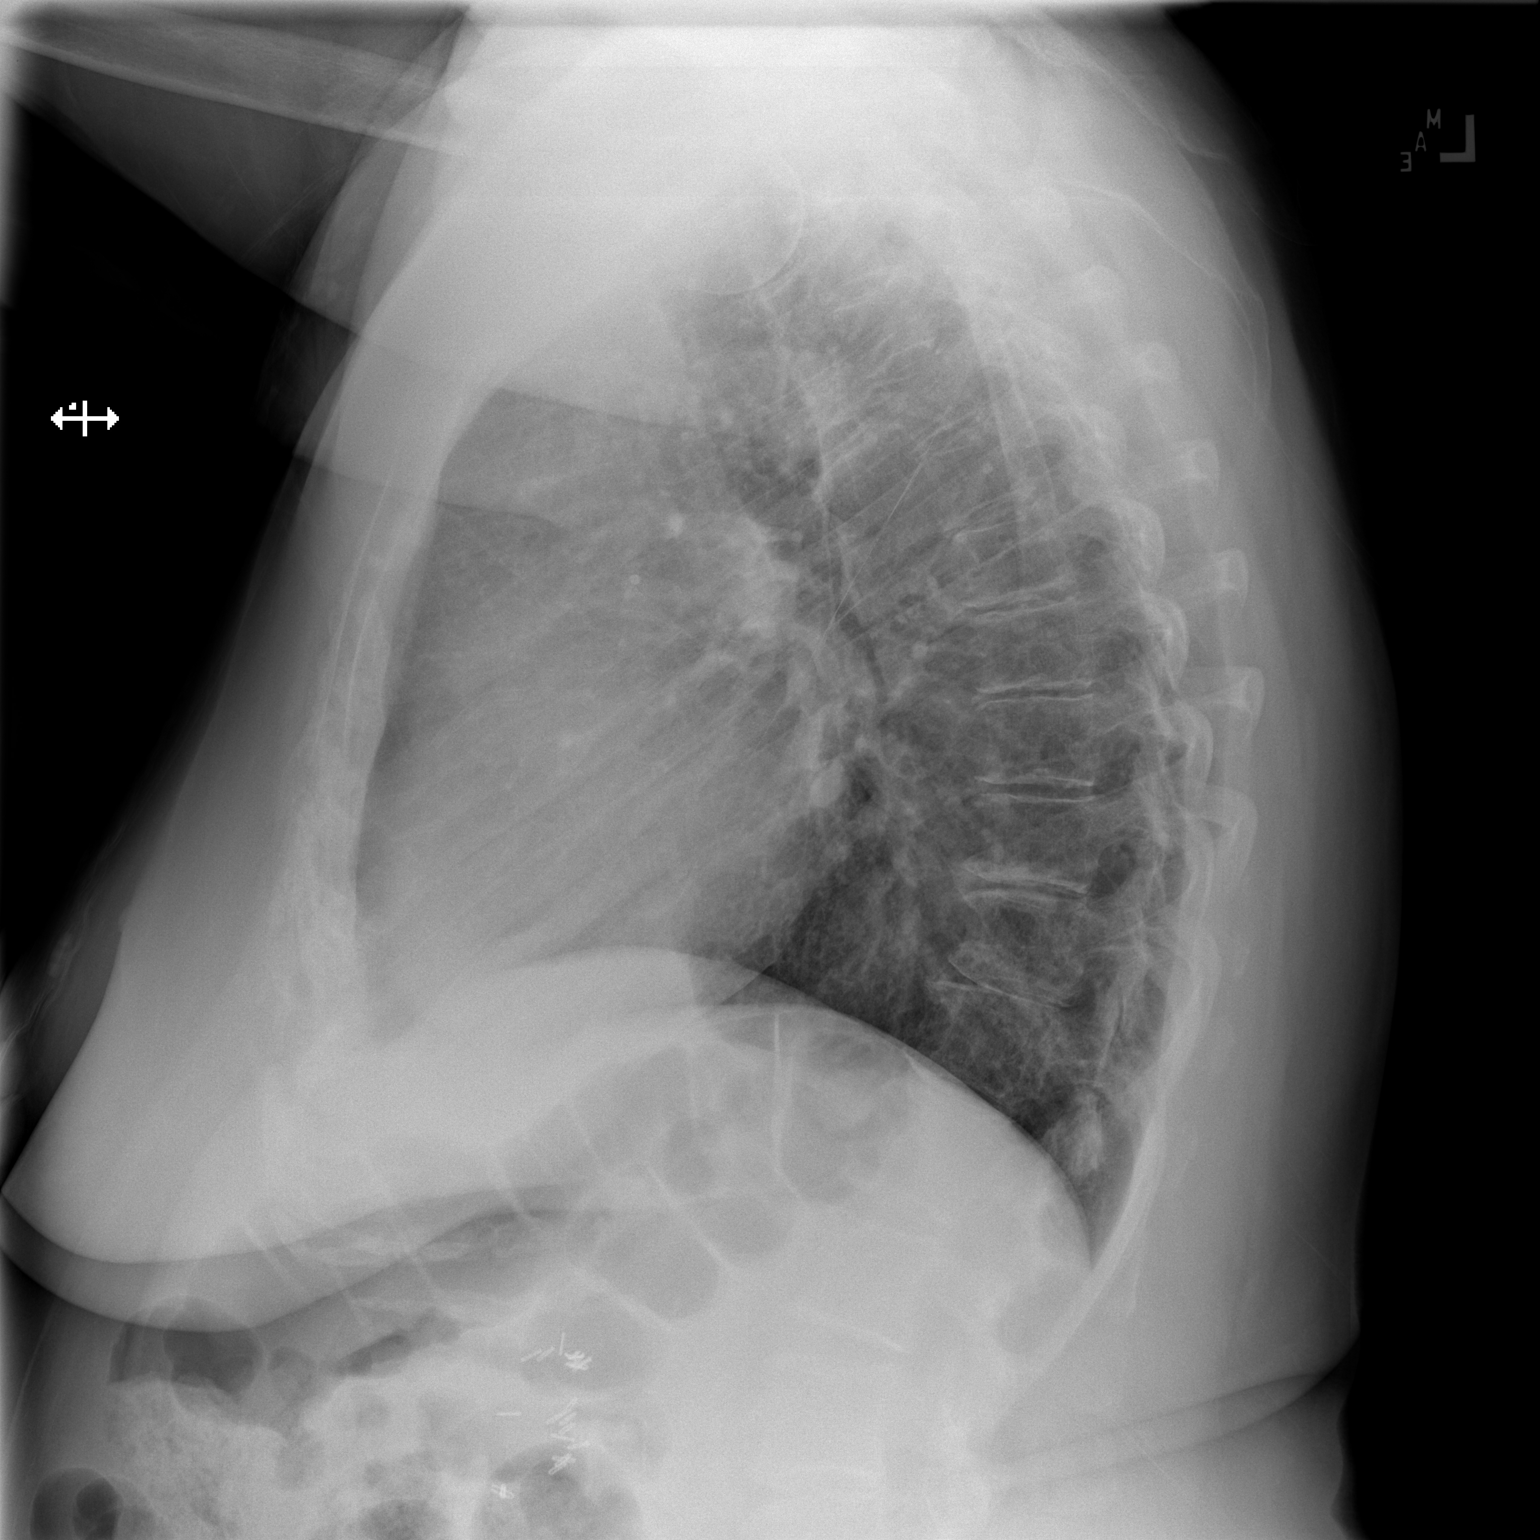

[2 of 2 positions shown; findings below may reference images not displayed]

FINDINGS: The heart size and mediastinal contours are within normal limits.
Both lungs are clear. The visualized skeletal structures are
unremarkable.
IMPRESSION: No active cardiopulmonary disease.

## 2022-09-26 ENCOUNTER — Other Ambulatory Visit: Payer: Self-pay

## 2022-09-26 DIAGNOSIS — Z1231 Encounter for screening mammogram for malignant neoplasm of breast: Secondary | ICD-10-CM

## 2022-10-02 DIAGNOSIS — G40209 Localization-related (focal) (partial) symptomatic epilepsy and epileptic syndromes with complex partial seizures, not intractable, without status epilepticus: Secondary | ICD-10-CM | POA: Diagnosis not present

## 2022-10-04 ENCOUNTER — Other Ambulatory Visit: Payer: Self-pay | Admitting: Cardiology

## 2022-10-18 ENCOUNTER — Encounter: Payer: Self-pay | Admitting: Family Medicine

## 2022-10-18 ENCOUNTER — Ambulatory Visit: Payer: Medicare HMO | Admitting: Family Medicine

## 2022-10-18 VITALS — BP 130/81 | HR 65 | Ht 66.0 in | Wt 242.0 lb

## 2022-10-18 DIAGNOSIS — E785 Hyperlipidemia, unspecified: Secondary | ICD-10-CM | POA: Diagnosis not present

## 2022-10-18 DIAGNOSIS — M1712 Unilateral primary osteoarthritis, left knee: Secondary | ICD-10-CM

## 2022-10-18 DIAGNOSIS — M25511 Pain in right shoulder: Secondary | ICD-10-CM | POA: Diagnosis not present

## 2022-10-18 DIAGNOSIS — I10 Essential (primary) hypertension: Secondary | ICD-10-CM | POA: Diagnosis not present

## 2022-10-18 DIAGNOSIS — E1169 Type 2 diabetes mellitus with other specified complication: Secondary | ICD-10-CM | POA: Diagnosis not present

## 2022-10-18 DIAGNOSIS — J439 Emphysema, unspecified: Secondary | ICD-10-CM

## 2022-10-18 DIAGNOSIS — G8929 Other chronic pain: Secondary | ICD-10-CM | POA: Diagnosis not present

## 2022-10-18 MED ORDER — METHYLPREDNISOLONE ACETATE 80 MG/ML IJ SUSP
80.0000 mg | Freq: Once | INTRAMUSCULAR | Status: AC
Start: 2022-10-18 — End: 2022-10-18
  Administered 2022-10-18: 80 mg via INTRA_ARTICULAR

## 2022-10-18 MED ORDER — DICLOFENAC SODIUM 1 % EX GEL
2.0000 g | Freq: Four times a day (QID) | CUTANEOUS | 3 refills | Status: AC
Start: 2022-10-18 — End: ?

## 2022-10-18 MED ORDER — TIOTROPIUM BROMIDE MONOHYDRATE 18 MCG IN CAPS
ORAL_CAPSULE | RESPIRATORY_TRACT | 3 refills | Status: AC
Start: 2022-10-18 — End: ?

## 2022-10-18 NOTE — Progress Notes (Signed)
BP 130/81   Pulse 65   Ht 5\' 6"  (1.676 m)   Wt 242 lb (109.8 kg)   SpO2 96%   BMI 39.06 kg/m    Subjective:   Patient ID: Courtney Mcconnell, female    DOB: [birth year], 67 y.o.   MRN: [id number]  HPI: Courtney Mcconnell is a 67 y.o. female presenting on 10/18/2022 for Medical Management of Chronic Issues, Hyperlipidemia, Hypertension, and Shoulder Pain (right)   HPI Type 2 diabetes mellitus Patient comes in today for recheck of his diabetes. Patient has been currently taking no medicine, this was a new diagnosis. Patient is currently on an ACE inhibitor/ARB. Patient has not seen an ophthalmologist this year. Patient denies any new issues with their feet. The symptom started onset as an adult hypertension and hyperlipidemia ARE RELATED TO DM   Hypertension Patient is currently on carvedilol and losartan, and their blood pressure today is 130/81. Patient denies any lightheadedness or dizziness. Patient denies headaches, blurred vision, chest pains, shortness of breath, or weakness. Denies any side effects from medication and is content with current medication.   Hyperlipidemia Patient is coming in for recheck of his hyperlipidemia. The patient is currently taking atorvastatin. They deny any issues with myalgias or history of liver damage from it. They deny any focal numbness or weakness or chest pain.   COPD Patient is coming in for COPD recheck today.  He is currently on albuterol and Spiriva and Advair.  He has a mild chronic cough but denies any major coughing spells or wheezing spells.  He has 0 nighttime symptoms per week and 0 daytime symptoms per week currently.   Patient's right shoulder has been bothering her for some time, she is seeing go over do some physical therapy and it helps during the process but then came right back as soon as she finished.  Relevant past medical, surgical, family and social history reviewed and updated as indicated. Interim medical history since our last  visit reviewed. Allergies and medications reviewed and updated.  Review of Systems  Constitutional:  Negative for chills and fever.  Eyes:  Negative for visual disturbance.  Respiratory:  Negative for chest tightness and shortness of breath.   Cardiovascular:  Negative for chest pain and leg swelling.  Musculoskeletal:  Positive for arthralgias. Negative for back pain and gait problem.  Skin:  Negative for rash.  Neurological:  Negative for light-headedness and headaches.  Psychiatric/Behavioral:  Negative for agitation and behavioral problems.   All other systems reviewed and are negative.   Per HPI unless specifically indicated above   Allergies as of 10/18/2022       Reactions   Penicillins Hives   Bee Venom Swelling   Prednisone Other (See Comments)   Patient did not like how it made her feel, caused jittery feeling.    Augmentin [amoxicillin-pot Clavulanate] Rash   Doxycycline Hyclate Itching, Rash        Medication List        Accurate as of October 18, 2022  4:14 PM. If you have any questions, ask your nurse or doctor.          albuterol 108 (90 Base) MCG/ACT inhaler Commonly known as: VENTOLIN HFA INHALE TWO PUFFS BY MOUTH EVERY 6 HOURS AS NEEDED FOR WHEEZING OR SHORTNESS OF BREATH   aspirin EC 81 MG tablet Take 81 mg by mouth daily. Swallow whole.   atorvastatin 40 MG tablet Commonly known as: LIPITOR TAKE 1 TABLET BY MOUTH EVERY  DAY AT 6 IN THE EVENING   carvedilol 3.125 MG tablet Commonly known as: COREG TAKE 1 TABLET BY MOUTH TWICE DAILY WITH A MEAL   cetirizine 10 MG tablet Commonly known as: ZyrTEC Allergy Take 1 tablet (10 mg total) by mouth daily for 14 days.   diclofenac Sodium 1 % Gel Commonly known as: Voltaren Apply 2 g topically 4 (four) times daily.   fluticasone-salmeterol 100-50 MCG/ACT Aepb Commonly known as: ADVAIR Inhale 1 puff into the lungs 2 (two) times daily.   losartan 25 MG tablet Commonly known as: COZAAR TAKE 1  TABLET BY MOUTH EVERY DAY   mupirocin ointment 2 % Commonly known as: BACTROBAN Apply 1 Application topically 2 (two) times daily.   nitroGLYCERIN 0.4 MG SL tablet Commonly known as: NITROSTAT PLACE ONE TABLET UNDER THE TONGUE EVERY FIVE MINUTES AS NEEDED FOR CHEST PAIN   omeprazole 20 MG tablet Commonly known as: PRILOSEC OTC Take 20 mg by mouth 2 (two) times daily.   tiotropium 18 MCG inhalation capsule Commonly known as: Spiriva HandiHaler place 1 CAPSULE into inhaler AND INHALE DAILY   Vimpat 150 MG Tabs Generic drug: Lacosamide Take 300 mg by mouth 2 (two) times daily.   zonisamide 100 MG capsule Commonly known as: ZONEGRAN Take 200 mg by mouth at bedtime.         Objective:   BP 130/81   Pulse 65   Ht 5\' 6"  (1.676 m)   Wt 242 lb (109.8 kg)   SpO2 96%   BMI 39.06 kg/m   Wt Readings from Last 3 Encounters:  10/18/22 242 lb (109.8 kg)  09/04/22 235 lb 12.8 oz (107 kg)  05/23/22 229 lb (103.9 kg)    Physical Exam Vitals and nursing note reviewed.  Constitutional:      General: She is not in acute distress.    Appearance: She is well-developed. She is not diaphoretic.  Eyes:     Conjunctiva/sclera: Conjunctivae normal.  Cardiovascular:     Rate and Rhythm: Normal rate and regular rhythm.     Heart sounds: Normal heart sounds. No murmur heard. Pulmonary:     Effort: Pulmonary effort is normal. No respiratory distress.     Breath sounds: Normal breath sounds. No wheezing.  Musculoskeletal:     Right shoulder: Tenderness and crepitus present. No deformity. Decreased range of motion. Normal strength. Normal pulse.  Skin:    General: Skin is warm and dry.     Findings: No rash.  Neurological:     Mental Status: She is alert and oriented to person, place, and time.     Coordination: Coordination normal.  Psychiatric:        Behavior: Behavior normal.     Right subacromial injection: Consent form signed. Risk factors of bleeding and infection discussed  with patient and patient is agreeable towards injection. Patient prepped with Betadine.  Posterior approach towards injection used. Injected 80 mg of Depo-Medrol and 1 mL of 2% lidocaine. Patient tolerated procedure well and no side effects from noted. Minimal to no bleeding. Simple bandage applied after.   Assessment & Plan:   Problem List Items Addressed This Visit       Cardiovascular and Mediastinum   Essential hypertension (Chronic)   Relevant Orders   CMP14+EGFR     Respiratory   Pulmonary emphysema (HCC)   Relevant Medications   tiotropium (SPIRIVA HANDIHALER) 18 MCG inhalation capsule     Endocrine   Type 2 diabetes mellitus with other specified complication (  HCC) - Primary   Relevant Orders   Bayer DCA Hb A1c Waived   CMP14+EGFR   Lipid panel     Other   Hyperlipidemia with target LDL less than 70 -> CAD (Chronic)   Relevant Orders   CMP14+EGFR   Lipid panel   Other Visit Diagnoses     Primary osteoarthritis of left knee       Relevant Medications   diclofenac Sodium (VOLTAREN) 1 % GEL   Chronic right shoulder pain           Will try shoulder injection, discussed diabetes and diet and monitoring, will check blood work today again.  Follow up plan: Return if symptoms worsen or fail to improve.  Counseling provided for all of the vaccine components Orders Placed This Encounter  Procedures   Bayer DCA Hb A1c Waived   CMP14+EGFR   Lipid panel    Arville Care, MD [facility] 10/18/2022, 4:14 PM

## 2022-10-19 LAB — LIPID PANEL
Chol/HDL Ratio: 2.6 ratio (ref 0.0–4.4)
Cholesterol, Total: 143 mg/dL (ref 100–199)
HDL: 56 mg/dL (ref >39)
LDL Chol Calc (NIH): 69 mg/dL (ref 0–99)
Triglycerides: 100 mg/dL (ref 0–149)
VLDL Cholesterol Cal: 18 mg/dL (ref 5–40)

## 2022-10-19 LAB — CMP14+EGFR
ALT: 7 IU/L (ref 0–32)
AST: 15 IU/L (ref 0–40)
Albumin: 4 g/dL (ref 3.9–4.9)
Alkaline Phosphatase: 140 IU/L — ABNORMAL HIGH (ref 44–121)
BUN/Creatinine Ratio: 18 (ref 12–28)
BUN: 13 mg/dL (ref 8–27)
Bilirubin Total: 0.3 mg/dL (ref 0.0–1.2)
CO2: 24 mmol/L (ref 20–29)
Calcium: 8.5 mg/dL — ABNORMAL LOW (ref 8.7–10.3)
Chloride: 103 mmol/L (ref 96–106)
Creatinine, Ser: 0.74 mg/dL (ref 0.57–1.00)
Globulin, Total: 2.8 g/dL (ref 1.5–4.5)
Glucose: 96 mg/dL (ref 70–99)
Potassium: 4.1 mmol/L (ref 3.5–5.2)
Sodium: 139 mmol/L (ref 134–144)
Total Protein: 6.8 g/dL (ref 6.0–8.5)
eGFR: 89 mL/min/{1.73_m2} (ref >59)

## 2022-10-19 LAB — BAYER DCA HB A1C WAIVED: HB A1C (BAYER DCA - WAIVED): 6.8 % — ABNORMAL HIGH (ref 4.8–5.6)

## 2022-11-27 ENCOUNTER — Encounter: Payer: Self-pay | Admitting: Family Medicine

## 2022-11-29 ENCOUNTER — Other Ambulatory Visit: Payer: Self-pay | Admitting: Family Medicine

## 2022-12-06 ENCOUNTER — Encounter: Payer: Self-pay | Admitting: Family Medicine

## 2022-12-10 ENCOUNTER — Encounter: Payer: Self-pay | Admitting: Family Medicine

## 2022-12-10 ENCOUNTER — Ambulatory Visit (INDEPENDENT_AMBULATORY_CARE_PROVIDER_SITE_OTHER): Payer: Medicare HMO | Admitting: Family Medicine

## 2022-12-10 VITALS — BP 131/80 | HR 60 | Ht 66.0 in | Wt 242.0 lb

## 2022-12-10 DIAGNOSIS — J441 Chronic obstructive pulmonary disease with (acute) exacerbation: Secondary | ICD-10-CM | POA: Diagnosis not present

## 2022-12-10 MED ORDER — METHYLPREDNISOLONE ACETATE 80 MG/ML IJ SUSP
80.0000 mg | Freq: Once | INTRAMUSCULAR | Status: AC
Start: 2022-12-10 — End: 2022-12-10
  Administered 2022-12-10: 80 mg via INTRA_ARTICULAR

## 2022-12-10 MED ORDER — CEFDINIR 300 MG PO CAPS: 300.0000 mg | ORAL_CAPSULE | Freq: Two times a day (BID) | ORAL | 0 refills | Status: DC

## 2022-12-10 NOTE — Progress Notes (Addendum)
BP 131/80   Pulse 60   Ht 5\' 6"  (1.676 m)   Wt 242 lb (109.8 kg)   SpO2 98%   BMI 39.06 kg/m    Subjective:   Patient ID: Courtney Mcconnell, female    DOB: June 03, 1955, 67 y.o.   MRN: [id number]  HPI: Courtney Mcconnell is a 67 y.o. female presenting on 12/10/2022 for URI   HPI Cough and congestion and wheezing Patient is coming in today with complaints of cough and congestion and wheezing that is been going on for about the past week and a half.  She is using her Spiriva and Advair, she only uses the Advair once a day.  She says she has just been having these and is just not improving.  She denies any major shortness of breath but she does have a productive cough with some lower rib pain from the coughing and sometimes some dizzy spells when she is coughing a lot.  She denies any sick contacts that she knows of.  Relevant past medical, surgical, family and social history reviewed and updated as indicated. Interim medical history since our last visit reviewed. Allergies and medications reviewed and updated.  Review of Systems  Constitutional:  Negative for chills and fever.  HENT:  Positive for congestion and postnasal drip. Negative for ear discharge, ear pain, rhinorrhea, sinus pressure, sneezing and sore throat.   Eyes:  Negative for pain, redness and visual disturbance.  Respiratory:  Positive for cough and wheezing. Negative for chest tightness and shortness of breath.   Cardiovascular:  Negative for chest pain and leg swelling.  Genitourinary:  Negative for difficulty urinating and dysuria.  Musculoskeletal:  Negative for back pain, gait problem and myalgias.  Skin:  Negative for rash.  Neurological:  Negative for light-headedness and headaches.  Psychiatric/Behavioral:  Negative for agitation and behavioral problems.   All other systems reviewed and are negative.   Per HPI unless specifically indicated above   Allergies as of 12/10/2022       Reactions   Penicillins Hives    Bee Venom Swelling   Prednisone Other (See Comments)   Patient did not like how it made her feel, caused jittery feeling.    Augmentin [amoxicillin-pot Clavulanate] Rash   Doxycycline Hyclate Itching, Rash        Medication List        Accurate as of December 10, 2022  4:36 PM. If you have any questions, ask your nurse or doctor.          albuterol 108 (90 Base) MCG/ACT inhaler Commonly known as: VENTOLIN HFA INHALE TWO PUFFS BY MOUTH EVERY 6 HOURS AS NEEDED FOR WHEEZING OR SHORTNESS OF BREATH   aspirin EC 81 MG tablet Take 81 mg by mouth daily. Swallow whole.   atorvastatin 40 MG tablet Commonly known as: LIPITOR TAKE 1 TABLET BY MOUTH EVERY DAY AT 6 IN THE EVENING   carvedilol 3.125 MG tablet Commonly known as: COREG TAKE 1 TABLET BY MOUTH TWICE DAILY WITH A MEAL   cefdinir 300 MG capsule Commonly known as: OMNICEF Take 1 capsule (300 mg total) by mouth 2 (two) times daily. 1 po BID Started by: Elige Radon Kenzee Bassin   cetirizine 10 MG tablet Commonly known as: ZyrTEC Allergy Take 1 tablet (10 mg total) by mouth daily for 14 days.   diclofenac Sodium 1 % Gel Commonly known as: Voltaren Apply 2 g topically 4 (four) times daily.   fluticasone-salmeterol 100-50 MCG/ACT Aepb Commonly known  as: ADVAIR Inhale 1 puff into the lungs 2 (two) times daily.   losartan 25 MG tablet Commonly known as: COZAAR TAKE 1 TABLET BY MOUTH EVERY DAY   mupirocin ointment 2 % Commonly known as: BACTROBAN Apply 1 Application topically 2 (two) times daily.   nitroGLYCERIN 0.4 MG SL tablet Commonly known as: NITROSTAT DISSOLVE 1 TABLET UNDER THE TONGUE EVERY 5 MINUTES AS NEEDED FOR CHEST PAIN. DO NOT EXCEED A TOTAL OF 3 DOSES IN 15 MINUTES.   omeprazole 20 MG tablet Commonly known as: PRILOSEC OTC Take 20 mg by mouth 2 (two) times daily.   tiotropium 18 MCG inhalation capsule Commonly known as: Spiriva HandiHaler place 1 CAPSULE into inhaler AND INHALE DAILY   Vimpat 150 MG  Tabs Generic drug: Lacosamide Take 300 mg by mouth 2 (two) times daily.   zonisamide 100 MG capsule Commonly known as: ZONEGRAN Take 200 mg by mouth at bedtime.         Objective:   BP 131/80   Pulse 60   Ht 5\' 6"  (1.676 m)   Wt 242 lb (109.8 kg)   SpO2 98%   BMI 39.06 kg/m   Wt Readings from Last 3 Encounters:  12/10/22 242 lb (109.8 kg)  10/18/22 242 lb (109.8 kg)  09/04/22 235 lb 12.8 oz (107 kg)    Physical Exam Vitals reviewed.  Constitutional:      General: She is not in acute distress.    Appearance: She is well-developed. She is not diaphoretic.  HENT:     Right Ear: Tympanic membrane, ear canal and external ear normal.     Left Ear: Tympanic membrane, ear canal and external ear normal.     Nose: Mucosal edema and rhinorrhea present.     Right Sinus: No maxillary sinus tenderness or frontal sinus tenderness.     Left Sinus: No maxillary sinus tenderness or frontal sinus tenderness.     Mouth/Throat:     Pharynx: Uvula midline. No oropharyngeal exudate or posterior oropharyngeal erythema.     Tonsils: No tonsillar abscesses.  Eyes:     Conjunctiva/sclera: Conjunctivae normal.  Cardiovascular:     Rate and Rhythm: Normal rate and regular rhythm.     Heart sounds: Normal heart sounds. No murmur heard. Pulmonary:     Effort: Pulmonary effort is normal. No respiratory distress.     Breath sounds: Normal breath sounds. No wheezing, rhonchi or rales.  Musculoskeletal:        General: No swelling.  Skin:    General: Skin is warm and dry.     Findings: No rash.  Neurological:     Mental Status: She is alert and oriented to person, place, and time.     Coordination: Coordination normal.  Psychiatric:        Behavior: Behavior normal.       Assessment & Plan:   Problem List Items Addressed This Visit   None Visit Diagnoses     COPD exacerbation (HCC)    -  Primary   Relevant Medications   methylPREDNISolone acetate (DEPO-MEDROL) injection 80 mg  (Start on 12/10/2022  4:45 PM)   cefdinir (OMNICEF) 300 MG capsule     Will give steroids and antibiotics to help with COPD exacerbation and keep using inhalers  Give Depo-Medrol intramuscular injection Follow up plan: Return if symptoms worsen or fail to improve.  Counseling provided for all of the vaccine components No orders of the defined types were placed in this encounter.  Arville Care, MD [facility] 12/10/2022, 4:36 PM

## 2022-12-24 ENCOUNTER — Telehealth: Payer: Self-pay | Admitting: Family Medicine

## 2022-12-31 ENCOUNTER — Ambulatory Visit: Payer: Medicare HMO

## 2022-12-31 DIAGNOSIS — E1169 Type 2 diabetes mellitus with other specified complication: Secondary | ICD-10-CM

## 2022-12-31 LAB — HM DIABETES EYE EXAM

## 2022-12-31 NOTE — Progress Notes (Signed)
Courtney Mcconnell arrived 12/31/2022 and has given verbal consent to obtain images and complete their overdue diabetic retinal screening.  The images have been sent to an ophthalmologist or optometrist for review and interpretation.  Results will be sent back to Dettinger, Elige Radon, MD for review.  Patient has been informed they will be contacted when we receive the results via telephone or MyChart

## 2023-01-21 ENCOUNTER — Encounter: Payer: Self-pay | Admitting: Family Medicine

## 2023-01-21 ENCOUNTER — Ambulatory Visit (INDEPENDENT_AMBULATORY_CARE_PROVIDER_SITE_OTHER): Payer: Medicare HMO | Admitting: Family Medicine

## 2023-01-21 VITALS — BP 144/78 | HR 68 | Ht 66.0 in | Wt 246.0 lb

## 2023-01-21 DIAGNOSIS — E1169 Type 2 diabetes mellitus with other specified complication: Secondary | ICD-10-CM | POA: Diagnosis not present

## 2023-01-21 DIAGNOSIS — I10 Essential (primary) hypertension: Secondary | ICD-10-CM

## 2023-01-21 DIAGNOSIS — J449 Chronic obstructive pulmonary disease, unspecified: Secondary | ICD-10-CM

## 2023-01-21 DIAGNOSIS — E785 Hyperlipidemia, unspecified: Secondary | ICD-10-CM | POA: Diagnosis not present

## 2023-01-21 DIAGNOSIS — J439 Emphysema, unspecified: Secondary | ICD-10-CM

## 2023-01-21 LAB — BAYER DCA HB A1C WAIVED: HB A1C (BAYER DCA - WAIVED): 6.7 % — ABNORMAL HIGH (ref 4.8–5.6)

## 2023-01-21 MED ORDER — SEMAGLUTIDE(0.25 OR 0.5MG/DOS) 2 MG/3ML ~~LOC~~ SOPN
0.5000 mg | PEN_INJECTOR | SUBCUTANEOUS | 3 refills | Status: DC
Start: 2023-01-21 — End: 2023-06-03

## 2023-01-21 NOTE — Progress Notes (Signed)
BP (!) 144/78   Pulse 68   Ht 5\' 6"  (1.676 m)   Wt 246 lb (111.6 kg)   SpO2 95%   BMI 39.71 kg/m    Subjective:   Patient ID: Courtney Mcconnell, female    DOB: Dec 19, 1955, 67 y.o.   MRN: [id number]  HPI: Courtney Mcconnell is a 67 y.o. female presenting on 01/21/2023 for Medical Management of Chronic Issues, Hyperlipidemia, Hypertension, and Diabetes   HPI Type 2 diabetes mellitus Patient comes in today for recheck of his diabetes. Patient has been currently taking no medicine, has been diet controlled, A1c 6.7.  She has been gaining weight wants to do something to help with weight loss. Patient is currently on an ACE inhibitor/ARB. Patient has seen an ophthalmologist this year. Patient denies any new issues with their feet. The symptom started onset as an adult hypertension and hyperlipidemia ARE RELATED TO DM   Hypertension Patient is currently on carvedilol and losartan, and their blood pressure today is 144/78. Patient denies any lightheadedness or dizziness. Patient denies headaches, blurred vision, chest pains, shortness of breath, or weakness. Denies any side effects from medication and is content with current medication.   Hyperlipidemia Patient is coming in for recheck of his hyperlipidemia. The patient is currently taking atorvastatin. They deny any issues with myalgias or history of liver damage from it. They deny any focal numbness or weakness or chest pain.   COPD Patient is coming in for COPD recheck today.  He is currently on Spiriva and albuterol.  He has a mild chronic cough but denies any major coughing spells or wheezing spells.  He has 1 nighttime symptoms per week and 1 daytime symptoms per week currently.  Feels like her breathing is doing pretty well.  Relevant past medical, surgical, family and social history reviewed and updated as indicated. Interim medical history since our last visit reviewed. Allergies and medications reviewed and updated.  Review of Systems   Constitutional:  Positive for appetite change. Negative for chills and fever.  HENT:  Negative for congestion, ear discharge and ear pain.   Eyes:  Negative for redness and visual disturbance.  Respiratory:  Negative for cough, chest tightness, shortness of breath and wheezing.   Cardiovascular:  Negative for chest pain and leg swelling.  Genitourinary:  Negative for difficulty urinating and dysuria.  Musculoskeletal:  Negative for back pain and gait problem.  Skin:  Negative for rash.  Neurological:  Negative for light-headedness and headaches.  Psychiatric/Behavioral:  Negative for agitation and behavioral problems.   All other systems reviewed and are negative.   Per HPI unless specifically indicated above   Allergies as of 01/21/2023       Reactions   Penicillins Hives   Bee Venom Swelling   Prednisone Other (See Comments)   Patient did not like how it made her feel, caused jittery feeling.    Augmentin [amoxicillin-pot Clavulanate] Rash   Doxycycline Hyclate Itching, Rash        Medication List        Accurate as of January 21, 2023  4:18 PM. If you have any questions, ask your nurse or doctor.          STOP taking these medications    cefdinir 300 MG capsule Commonly known as: OMNICEF Stopped by: Courtney Mcconnell       TAKE these medications    albuterol 108 (90 Base) MCG/ACT inhaler Commonly known as: VENTOLIN HFA INHALE TWO PUFFS BY MOUTH  EVERY 6 HOURS AS NEEDED FOR WHEEZING OR SHORTNESS OF BREATH   aspirin EC 81 MG tablet Take 81 mg by mouth daily. Swallow whole.   atorvastatin 40 MG tablet Commonly known as: LIPITOR TAKE 1 TABLET BY MOUTH EVERY DAY AT 6 IN THE EVENING   carvedilol 3.125 MG tablet Commonly known as: COREG TAKE 1 TABLET BY MOUTH TWICE DAILY WITH A MEAL   cetirizine 10 MG tablet Commonly known as: ZyrTEC Allergy Take 1 tablet (10 mg total) by mouth daily for 14 days.   diclofenac Sodium 1 % Gel Commonly known as:  Voltaren Apply 2 g topically 4 (four) times daily.   fluticasone-salmeterol 100-50 MCG/ACT Aepb Commonly known as: ADVAIR Inhale 1 puff into the lungs 2 (two) times daily.   losartan 25 MG tablet Commonly known as: COZAAR TAKE 1 TABLET BY MOUTH EVERY DAY   mupirocin ointment 2 % Commonly known as: BACTROBAN Apply 1 Application topically 2 (two) times daily.   nitroGLYCERIN 0.4 MG SL tablet Commonly known as: NITROSTAT DISSOLVE 1 TABLET UNDER THE TONGUE EVERY 5 MINUTES AS NEEDED FOR CHEST PAIN. DO NOT EXCEED A TOTAL OF 3 DOSES IN 15 MINUTES.   omeprazole 20 MG tablet Commonly known as: PRILOSEC OTC Take 20 mg by mouth 2 (two) times daily.   Semaglutide(0.25 or 0.5MG /DOS) 2 MG/3ML Sopn Inject 0.5 mg into the skin once a week. Started by: Courtney Mcconnell   tiotropium 18 MCG inhalation capsule Commonly known as: Spiriva HandiHaler place 1 CAPSULE into inhaler AND INHALE DAILY   Vimpat 150 MG Tabs Generic drug: Lacosamide Take 300 mg by mouth 2 (two) times daily.   zonisamide 100 MG capsule Commonly known as: ZONEGRAN Take 200 mg by mouth at bedtime.         Objective:   BP (!) 144/78   Pulse 68   Ht 5\' 6"  (1.676 m)   Wt 246 lb (111.6 kg)   SpO2 95%   BMI 39.71 kg/m   Wt Readings from Last 3 Encounters:  01/21/23 246 lb (111.6 kg)  12/10/22 242 lb (109.8 kg)  10/18/22 242 lb (109.8 kg)    Physical Exam Vitals and nursing note reviewed.  Constitutional:      General: She is not in acute distress.    Appearance: She is well-developed. She is not diaphoretic.  Eyes:     Conjunctiva/sclera: Conjunctivae normal.  Cardiovascular:     Rate and Rhythm: Normal rate and regular rhythm.     Heart sounds: Normal heart sounds. No murmur heard. Pulmonary:     Effort: Pulmonary effort is normal. No respiratory distress.     Breath sounds: Normal breath sounds. No wheezing.  Musculoskeletal:        General: No swelling.  Skin:    General: Skin is warm and dry.      Findings: No rash.  Neurological:     Mental Status: She is alert and oriented to person, place, and time.     Coordination: Coordination normal.  Psychiatric:        Behavior: Behavior normal.     Results for orders placed or performed in visit on 01/07/23  HM DIABETES EYE EXAM  Result Value Ref Range   HM Diabetic Eye Exam No Retinopathy No Retinopathy    Assessment & Plan:   Problem List Items Addressed This Visit       Cardiovascular and Mediastinum   Essential hypertension (Chronic)     Respiratory   Pulmonary emphysema (HCC)  Endocrine   Type 2 diabetes mellitus with other specified complication (HCC) - Primary   Relevant Medications   Semaglutide,0.25 or 0.5MG /DOS, 2 MG/3ML SOPN   Other Relevant Orders   CBC with Differential/Platelet   Bayer DCA Hb A1c Waived     Other   Hyperlipidemia with target LDL less than 70 -> CAD (Chronic)  A1c 6.7.  She has been gaining weight and worried that it will start affect her blood sugar.  Working to try [employer].  Gave sample for her today.  She is can start at 0.25 mg weekly for 4 weeks and then go up to 0.5 mg weekly  Follow up plan: Return in about 3 months (around 04/21/2023), or if symptoms worsen or fail to improve, for Diabetes recheck.  Counseling provided for all of the vaccine components Orders Placed This Encounter  Procedures   CBC with Differential/Platelet   Bayer DCA Hb A1c Waived    Arville Care, MD [facility] 01/21/2023, 4:18 PM

## 2023-01-22 LAB — CBC WITH DIFFERENTIAL/PLATELET
Basophils Absolute: 0.1 10*3/uL (ref 0.0–0.2)
Basos: 1 %
EOS (ABSOLUTE): 0.1 10*3/uL (ref 0.0–0.4)
Eos: 2 %
Hematocrit: 37.2 % (ref 34.0–46.6)
Hemoglobin: 11.9 g/dL (ref 11.1–15.9)
Immature Grans (Abs): 0 10*3/uL (ref 0.0–0.1)
Immature Granulocytes: 0 %
Lymphocytes Absolute: 2.3 10*3/uL (ref 0.7–3.1)
Lymphs: 32 %
MCH: 31.8 pg (ref 26.6–33.0)
MCHC: 32 g/dL (ref 31.5–35.7)
MCV: 100 fL — ABNORMAL HIGH (ref 79–97)
Monocytes Absolute: 0.8 10*3/uL (ref 0.1–0.9)
Monocytes: 11 %
Neutrophils Absolute: 4.1 10*3/uL (ref 1.4–7.0)
Neutrophils: 54 %
Platelets: 216 10*3/uL (ref 150–450)
RBC: 3.74 x10E6/uL — ABNORMAL LOW (ref 3.77–5.28)
RDW: 12.7 % (ref 11.7–15.4)
WBC: 7.4 10*3/uL (ref 3.4–10.8)

## 2023-01-24 ENCOUNTER — Encounter: Payer: Self-pay | Admitting: Family Medicine

## 2023-01-28 ENCOUNTER — Telehealth: Payer: Self-pay

## 2023-01-28 NOTE — Telephone Encounter (Signed)
Cheyne Cliett (Key: [id number]) Rx #: [id number] Ozempic (0.25 or 0.5 MG/DOSE) 2MG /3ML pen-injectors Form [employer] Electronic PA Form Created 6 days ago Sent to Plan 4 minutes ago Plan Response 3 minutes ago Submit Clinical Questions less than a minute ago Determination Wait for Determination

## 2023-01-29 NOTE — Telephone Encounter (Signed)
Pharmacy Patient Advocate Encounter  Received notification from [facility] that Prior Authorization for Ozempic (0.25 or 0.5 MG/DOSE) 2MG /3ML pen-injectors has been APPROVED from 02/12/22 to 02/12/24   PA #/Case ID/Reference #: [id number]

## 2023-02-04 ENCOUNTER — Other Ambulatory Visit: Payer: Self-pay | Admitting: Family Medicine

## 2023-04-12 ENCOUNTER — Telehealth: Payer: Self-pay | Admitting: Family Medicine

## 2023-04-12 DIAGNOSIS — Z0279 Encounter for issue of other medical certificate: Secondary | ICD-10-CM

## 2023-04-12 NOTE — Telephone Encounter (Signed)
 Courtney Mcconnell (son) dropped off FMLA forms to be completed and signed.  Form Fee Paid? (Y/N)   Yes         If NO, form is placed on front office manager desk to hold until payment received. If YES, then form will be placed in the RX/HH Nurse Coordinators box for completion.  Form will not be processed until payment is received   Call when ready!  Please fax, make copy & pt will pick-up their copy.

## 2023-04-19 NOTE — Telephone Encounter (Signed)
 PCP completed and signed FMLA forms. They have been faxed to [employer] at fax number [phone]. Patient has been contacted and informed they are complete.copy at front desk.

## 2023-04-26 ENCOUNTER — Ambulatory Visit: Payer: 59 | Admitting: Family Medicine

## 2023-04-26 ENCOUNTER — Encounter: Payer: Self-pay | Admitting: Family Medicine

## 2023-04-26 VITALS — BP 138/87 | HR 60 | Ht 66.0 in | Wt 238.0 lb

## 2023-04-26 DIAGNOSIS — I25119 Atherosclerotic heart disease of native coronary artery with unspecified angina pectoris: Secondary | ICD-10-CM | POA: Diagnosis not present

## 2023-04-26 DIAGNOSIS — E1169 Type 2 diabetes mellitus with other specified complication: Secondary | ICD-10-CM | POA: Diagnosis not present

## 2023-04-26 DIAGNOSIS — I10 Essential (primary) hypertension: Secondary | ICD-10-CM | POA: Diagnosis not present

## 2023-04-26 DIAGNOSIS — E785 Hyperlipidemia, unspecified: Secondary | ICD-10-CM

## 2023-04-26 DIAGNOSIS — Z1231 Encounter for screening mammogram for malignant neoplasm of breast: Secondary | ICD-10-CM

## 2023-04-26 DIAGNOSIS — Z7985 Long-term (current) use of injectable non-insulin antidiabetic drugs: Secondary | ICD-10-CM | POA: Diagnosis not present

## 2023-04-26 DIAGNOSIS — B372 Candidiasis of skin and nail: Secondary | ICD-10-CM

## 2023-04-26 LAB — BAYER DCA HB A1C WAIVED: HB A1C (BAYER DCA - WAIVED): 6 % — ABNORMAL HIGH (ref 4.8–5.6)

## 2023-04-26 MED ORDER — NYSTATIN 100000 UNIT/GM EX CREA: 1.0000 | TOPICAL_CREAM | Freq: Two times a day (BID) | CUTANEOUS | 3 refills | Status: AC

## 2023-04-26 NOTE — Progress Notes (Signed)
 BP 138/87   Pulse 60   Ht 5\' 6"  (1.676 m)   Wt 238 lb (108 kg)   SpO2 98%   BMI 38.41 kg/m    Subjective:   Patient ID: Courtney Mcconnell, female    DOB: 29-May-1955, 68 y.o.   MRN: [id number]  HPI: Courtney Mcconnell is a 68 y.o. female presenting on 04/26/2023 for Medical Management of Chronic Issues, Diabetes, Hyperlipidemia, and Hypertension   HPI Type 2 diabetes mellitus Patient comes in today for recheck of his diabetes. Patient has been currently taking Ozempic. Patient is currently on an ACE inhibitor/ARB. Patient has not seen an ophthalmologist this year. Patient denies any new issues with their feet. The symptom started onset as an adult hypertension and hyperlipidemia and CAD ARE RELATED TO DM   Hypertension Patient is currently on carvedilol and losartan, and their blood pressure today is carvedilol. Patient denies any lightheadedness or dizziness. Patient denies headaches, blurred vision, chest pains, shortness of breath, or weakness. Denies any side effects from medication and is content with current medication.   Hyperlipidemia and CAD recheck Patient is coming in for recheck of his hyperlipidemia. The patient is currently taking atorvastatin. They deny any issues with myalgias or history of liver damage from it. They deny any focal numbness or weakness or chest pain.   Relevant past medical, surgical, family and social history reviewed and updated as indicated. Interim medical history since our last visit reviewed. Allergies and medications reviewed and updated.  Review of Systems  Constitutional:  Negative for chills and fever.  HENT:  Negative for congestion, ear discharge and ear pain.   Eyes:  Negative for redness and visual disturbance.  Respiratory:  Negative for chest tightness and shortness of breath.   Cardiovascular:  Negative for chest pain and leg swelling.  Genitourinary:  Negative for difficulty urinating and dysuria.  Musculoskeletal:  Negative for back  pain and gait problem.  Skin:  Positive for rash (Yeast dermatitis under left breast).  Neurological:  Negative for dizziness, light-headedness and headaches.  Psychiatric/Behavioral:  Negative for agitation and behavioral problems.   All other systems reviewed and are negative.   Per HPI unless specifically indicated above   Allergies as of 04/26/2023       Reactions   Penicillins Hives   Bee Venom Swelling   Prednisone Other (See Comments)   Patient did not like how it made her feel, caused jittery feeling.    Augmentin [amoxicillin-pot Clavulanate] Rash   Doxycycline Hyclate Itching, Rash        Medication List        Accurate as of April 26, 2023  3:57 PM. If you have any questions, ask your nurse or doctor.          albuterol 108 (90 Base) MCG/ACT inhaler Commonly known as: VENTOLIN HFA INHALE TWO PUFFS BY MOUTH EVERY 6 HOURS AS NEEDED FOR WHEEZING OR SHORTNESS OF BREATH   aspirin EC 81 MG tablet Take 81 mg by mouth daily. Swallow whole.   atorvastatin 40 MG tablet Commonly known as: LIPITOR TAKE 1 TABLET BY MOUTH EVERY DAY AT 6 IN THE EVENING   carvedilol 3.125 MG tablet Commonly known as: COREG TAKE 1 TABLET BY MOUTH TWICE DAILY WITH A MEAL   cetirizine 10 MG tablet Commonly known as: ZyrTEC Allergy Take 1 tablet (10 mg total) by mouth daily for 14 days.   diclofenac Sodium 1 % Gel Commonly known as: Voltaren Apply 2 g topically 4 (  four) times daily.   fluticasone-salmeterol 100-50 MCG/ACT Aepb Commonly known as: ADVAIR INHALE 1 PUFF into THE lungs TWICE DAILY   losartan 25 MG tablet Commonly known as: COZAAR TAKE 1 TABLET BY MOUTH EVERY DAY   mupirocin ointment 2 % Commonly known as: BACTROBAN Apply 1 Application topically 2 (two) times daily.   nitroGLYCERIN 0.4 MG SL tablet Commonly known as: NITROSTAT DISSOLVE 1 TABLET UNDER THE TONGUE EVERY 5 MINUTES AS NEEDED FOR CHEST PAIN. DO NOT EXCEED A TOTAL OF 3 DOSES IN 15 MINUTES.   nystatin  cream Commonly known as: MYCOSTATIN Apply 1 Application topically 2 (two) times daily. Started by: Elige Radon Tramya Schoenfelder   omeprazole 20 MG tablet Commonly known as: PRILOSEC OTC Take 20 mg by mouth 2 (two) times daily.   Semaglutide(0.25 or 0.5MG /DOS) 2 MG/3ML Sopn Inject 0.5 mg into the skin once a week.   tiotropium 18 MCG inhalation capsule Commonly known as: Spiriva HandiHaler place 1 CAPSULE into inhaler AND INHALE DAILY   Vimpat 150 MG Tabs Generic drug: Lacosamide Take 300 mg by mouth 2 (two) times daily.   zonisamide 100 MG capsule Commonly known as: ZONEGRAN Take 200 mg by mouth at bedtime.         Objective:   BP 138/87   Pulse 60   Ht 5\' 6"  (1.676 m)   Wt 238 lb (108 kg)   SpO2 98%   BMI 38.41 kg/m   Wt Readings from Last 3 Encounters:  04/26/23 238 lb (108 kg)  01/21/23 246 lb (111.6 kg)  12/10/22 242 lb (109.8 kg)    Physical Exam Vitals and nursing note reviewed.  Constitutional:      General: She is not in acute distress.    Appearance: She is well-developed. She is not diaphoretic.  Eyes:     Conjunctiva/sclera: Conjunctivae normal.     Pupils: Pupils are equal, round, and reactive to light.  Cardiovascular:     Rate and Rhythm: Normal rate and regular rhythm.     Heart sounds: Normal heart sounds. No murmur heard. Pulmonary:     Effort: Pulmonary effort is normal. No respiratory distress.     Breath sounds: Normal breath sounds. No wheezing.  Musculoskeletal:        General: No tenderness. Normal range of motion.  Skin:    General: Skin is warm and dry.     Findings: Rash (Yeast dermatitis under left breast) present.  Neurological:     Mental Status: She is alert and oriented to person, place, and time.     Coordination: Coordination normal.  Psychiatric:        Behavior: Behavior normal.       Assessment & Plan:   Problem List Items Addressed This Visit       Cardiovascular and Mediastinum   Essential hypertension (Chronic)    Relevant Orders   CBC with Differential/Platelet   CMP14+EGFR   Lipid panel   Bayer DCA Hb A1c Waived   Coronary artery disease involving native coronary artery of native heart with angina pectoris (HCC) (Chronic)     Endocrine   Type 2 diabetes mellitus with other specified complication (HCC) - Primary   Relevant Orders   CBC with Differential/Platelet   CMP14+EGFR   Lipid panel   Bayer DCA Hb A1c Waived   Microalbumin / creatinine urine ratio     Other   Hyperlipidemia with target LDL less than 70 -> CAD (Chronic)   Relevant Orders   CBC with  Differential/Platelet   CMP14+EGFR   Lipid panel   Bayer DCA Hb A1c Waived   Other Visit Diagnoses       Encounter for screening mammogram for malignant neoplasm of breast       Relevant Orders   MM 3D SCREENING MAMMOGRAM BILATERAL BREAST     Yeast dermatitis       Relevant Medications   nystatin cream (MYCOSTATIN)       A1c is 6.0. Refilled her nystatin cream.  She was scheduled for mammograms. Follow up plan: Return in about 3 months (around 07/27/2023), or if symptoms worsen or fail to improve, for diabetes and htn.  Counseling provided for all of the vaccine components Orders Placed This Encounter  Procedures   MM 3D SCREENING MAMMOGRAM BILATERAL BREAST   CBC with Differential/Platelet   CMP14+EGFR   Lipid panel   Bayer DCA Hb A1c Waived   Microalbumin / creatinine urine ratio    Arville Care, MD [facility] 04/26/2023, 3:57 PM

## 2023-04-27 ENCOUNTER — Other Ambulatory Visit: Payer: Self-pay | Admitting: Family Medicine

## 2023-04-27 DIAGNOSIS — J439 Emphysema, unspecified: Secondary | ICD-10-CM

## 2023-04-27 LAB — CMP14+EGFR
ALT: 6 IU/L (ref 0–32)
AST: 14 IU/L (ref 0–40)
Albumin: 3.9 g/dL (ref 3.9–4.9)
Alkaline Phosphatase: 122 IU/L — ABNORMAL HIGH (ref 44–121)
BUN/Creatinine Ratio: 10 — ABNORMAL LOW (ref 12–28)
BUN: 7 mg/dL — ABNORMAL LOW (ref 8–27)
Bilirubin Total: 0.3 mg/dL (ref 0.0–1.2)
CO2: 26 mmol/L (ref 20–29)
Calcium: 8.6 mg/dL — ABNORMAL LOW (ref 8.7–10.3)
Chloride: 103 mmol/L (ref 96–106)
Creatinine, Ser: 0.73 mg/dL (ref 0.57–1.00)
Globulin, Total: 2.3 g/dL (ref 1.5–4.5)
Glucose: 157 mg/dL — ABNORMAL HIGH (ref 70–99)
Potassium: 3.7 mmol/L (ref 3.5–5.2)
Sodium: 140 mmol/L (ref 134–144)
Total Protein: 6.2 g/dL (ref 6.0–8.5)
eGFR: 90 mL/min/{1.73_m2} (ref >59)

## 2023-04-27 LAB — CBC WITH DIFFERENTIAL/PLATELET
Basophils Absolute: 0 10*3/uL (ref 0.0–0.2)
Basos: 0 %
EOS (ABSOLUTE): 0.1 10*3/uL (ref 0.0–0.4)
Eos: 2 %
Hematocrit: 38.1 % (ref 34.0–46.6)
Hemoglobin: 12.7 g/dL (ref 11.1–15.9)
Immature Grans (Abs): 0 10*3/uL (ref 0.0–0.1)
Immature Granulocytes: 0 %
Lymphocytes Absolute: 2.1 10*3/uL (ref 0.7–3.1)
Lymphs: 30 %
MCH: 32.6 pg (ref 26.6–33.0)
MCHC: 33.3 g/dL (ref 31.5–35.7)
MCV: 98 fL — ABNORMAL HIGH (ref 79–97)
Monocytes Absolute: 0.7 10*3/uL (ref 0.1–0.9)
Monocytes: 10 %
Neutrophils Absolute: 4.2 10*3/uL (ref 1.4–7.0)
Neutrophils: 58 %
Platelets: 228 10*3/uL (ref 150–450)
RBC: 3.89 x10E6/uL (ref 3.77–5.28)
RDW: 12.4 % (ref 11.7–15.4)
WBC: 7.2 10*3/uL (ref 3.4–10.8)

## 2023-04-27 LAB — MICROALBUMIN / CREATININE URINE RATIO
Creatinine, Urine: 101.1 mg/dL
Microalb/Creat Ratio: 14 mg/g{creat} (ref 0–29)
Microalbumin, Urine: 14.4 ug/mL

## 2023-04-27 LAB — LIPID PANEL
Chol/HDL Ratio: 2.5 ratio (ref 0.0–4.4)
Cholesterol, Total: 132 mg/dL (ref 100–199)
HDL: 52 mg/dL (ref >39)
LDL Chol Calc (NIH): 61 mg/dL (ref 0–99)
Triglycerides: 102 mg/dL (ref 0–149)
VLDL Cholesterol Cal: 19 mg/dL (ref 5–40)

## 2023-04-29 ENCOUNTER — Encounter: Payer: Self-pay | Admitting: Family Medicine

## 2023-05-02 ENCOUNTER — Other Ambulatory Visit: Payer: Self-pay | Admitting: Nurse Practitioner

## 2023-05-31 ENCOUNTER — Other Ambulatory Visit: Payer: Self-pay | Admitting: Family Medicine

## 2023-05-31 DIAGNOSIS — E1169 Type 2 diabetes mellitus with other specified complication: Secondary | ICD-10-CM

## 2023-06-02 ENCOUNTER — Other Ambulatory Visit: Payer: Self-pay | Admitting: Cardiology

## 2023-06-12 ENCOUNTER — Ambulatory Visit
Admission: RE | Admit: 2023-06-12 | Discharge: 2023-06-12 | Disposition: A | Discharge status: Home / Self Care | Source: Ambulatory Visit | Attending: Family Medicine

## 2023-06-12 DIAGNOSIS — Z1231 Encounter for screening mammogram for malignant neoplasm of breast: Secondary | ICD-10-CM | POA: Diagnosis not present

## 2023-06-25 LAB — HM DIABETES EYE EXAM

## 2023-06-29 ENCOUNTER — Other Ambulatory Visit: Payer: Self-pay | Admitting: Family Medicine

## 2023-06-29 DIAGNOSIS — E1169 Type 2 diabetes mellitus with other specified complication: Secondary | ICD-10-CM

## 2023-07-01 ENCOUNTER — Other Ambulatory Visit: Payer: Self-pay | Admitting: Cardiology

## 2023-07-29 ENCOUNTER — Other Ambulatory Visit: Payer: Self-pay | Admitting: Family Medicine

## 2023-07-29 DIAGNOSIS — E1169 Type 2 diabetes mellitus with other specified complication: Secondary | ICD-10-CM

## 2023-07-31 ENCOUNTER — Ambulatory Visit: Admitting: Family Medicine

## 2023-07-31 ENCOUNTER — Encounter: Payer: Self-pay | Admitting: Family Medicine

## 2023-07-31 VITALS — BP 136/83 | HR 61 | Ht 66.0 in | Wt 231.0 lb

## 2023-07-31 DIAGNOSIS — E785 Hyperlipidemia, unspecified: Secondary | ICD-10-CM | POA: Diagnosis not present

## 2023-07-31 DIAGNOSIS — E1169 Type 2 diabetes mellitus with other specified complication: Secondary | ICD-10-CM

## 2023-07-31 DIAGNOSIS — I10 Essential (primary) hypertension: Secondary | ICD-10-CM | POA: Diagnosis not present

## 2023-07-31 DIAGNOSIS — Z7985 Long-term (current) use of injectable non-insulin antidiabetic drugs: Secondary | ICD-10-CM

## 2023-07-31 LAB — BAYER DCA HB A1C WAIVED: HB A1C (BAYER DCA - WAIVED): 6.2 % — ABNORMAL HIGH (ref 4.8–5.6)

## 2023-07-31 MED ORDER — NITROGLYCERIN 0.4 MG SL SUBL: 0.4000 mg | SUBLINGUAL_TABLET | SUBLINGUAL | 2 refills | Status: DC | PRN

## 2023-07-31 MED ORDER — OZEMPIC (0.25 OR 0.5 MG/DOSE) 2 MG/3ML ~~LOC~~ SOPN: 0.5000 mg | PEN_INJECTOR | SUBCUTANEOUS | 3 refills | Status: DC

## 2023-07-31 MED ORDER — FLUTICASONE-SALMETEROL 100-50 MCG/ACT IN AEPB: 1.0000 | INHALATION_SPRAY | Freq: Two times a day (BID) | RESPIRATORY_TRACT | 1 refills | Status: AC

## 2023-07-31 NOTE — Progress Notes (Signed)
 BP 136/83   Pulse 61   Ht 5' 6 (1.676 m)   Wt 231 lb (104.8 kg)   SpO2 95%   BMI 37.28 kg/m    Subjective:   Patient ID: Courtney Mcconnell, female    DOB: 02/02/1956, 68 y.o.   MRN: [id number]  HPI: Courtney Mcconnell is a 68 y.o. female presenting on 07/31/2023 for Medical Management of Chronic Issues, Diabetes, Hypertension, and Hyperlipidemia   HPI Type 2 diabetes mellitus Patient comes in today for recheck of his diabetes. Patient has been currently taking Ozempic . Patient is currently on an ACE inhibitor/ARB. Patient has not seen an ophthalmologist this year. Patient denies any new issues with their feet. The symptom started onset as an adult hypertension and hyperlipidemia ARE RELATED TO DM   Hypertension Patient is currently on losartan  and carvedilol , and their blood pressure today is 136/83. Patient denies any lightheadedness or dizziness. Patient denies headaches, blurred vision, chest pains, shortness of breath, or weakness. Denies any side effects from medication and is content with current medication.   Hyperlipidemia Patient is coming in for recheck of his hyperlipidemia. The patient is currently taking atorvastatin . They deny any issues with myalgias or history of liver damage from it. They deny any focal numbness or weakness or chest pain.   Relevant past medical, surgical, family and social history reviewed and updated as indicated. Interim medical history since our last visit reviewed. Allergies and medications reviewed and updated.  Review of Systems  Constitutional:  Negative for chills and fever.  Eyes:  Negative for redness and visual disturbance.  Respiratory:  Negative for chest tightness and shortness of breath.   Cardiovascular:  Negative for chest pain and leg swelling.  Gastrointestinal:  Negative for abdominal pain, anal bleeding, blood in stool and constipation.  Genitourinary:  Negative for difficulty urinating and dysuria.  Musculoskeletal:  Negative for  back pain and gait problem.  Skin:  Negative for rash.  Neurological:  Negative for dizziness, light-headedness and headaches.  Psychiatric/Behavioral:  Negative for agitation and behavioral problems.   All other systems reviewed and are negative.   Per HPI unless specifically indicated above   Allergies as of 07/31/2023       Reactions   Penicillins Hives   Bee Venom Swelling   Prednisone  Other (See Comments)   Patient did not like how it made her feel, caused jittery feeling.    Augmentin  [amoxicillin -pot Clavulanate] Rash   Doxycycline  Hyclate Itching, Rash        Medication List        Accurate as of July 31, 2023  4:27 PM. If you have any questions, ask your nurse or doctor.          albuterol  108 (90 Base) MCG/ACT inhaler Commonly known as: VENTOLIN  HFA INHALE TWO PUFFS BY MOUTH EVERY 6 HOURS AS NEEDED FOR WHEEZING, SHORTNESS OF BREATH   aspirin  EC 81 MG tablet Take 81 mg by mouth daily. Swallow whole.   atorvastatin  40 MG tablet Commonly known as: LIPITOR TAKE 1 TABLET BY MOUTH EVERY DAY AT 6PM   carvedilol  3.125 MG tablet Commonly known as: COREG  TAKE 1 TABLET BY MOUTH TWICE DAILY WITH A MEAL   cetirizine  10 MG tablet Commonly known as: ZyrTEC  Allergy Take 1 tablet (10 mg total) by mouth daily for 14 days.   diclofenac  Sodium 1 % Gel Commonly known as: Voltaren  Apply 2 g topically 4 (four) times daily.   fluticasone -salmeterol 100-50 MCG/ACT Aepb Commonly known as:  ADVAIR Inhale 1 puff into the lungs 2 (two) times daily. What changed: See the new instructions. Changed by: Lucio Sabin Hermon Zea   losartan  25 MG tablet Commonly known as: COZAAR  TAKE 1 TABLET BY MOUTH EVERY DAY - NEEDS APPOINTMENT WITH DR. MCDOWELL   mupirocin  ointment 2 % Commonly known as: BACTROBAN  Apply 1 Application topically 2 (two) times daily.   nitroGLYCERIN  0.4 MG SL tablet Commonly known as: NITROSTAT  Place 1 tablet (0.4 mg total) under the tongue every 5 (five)  minutes as needed for chest pain. What changed: See the new instructions. Changed by: Lucio Sabin Jocilyn Trego   nystatin  cream Commonly known as: MYCOSTATIN  Apply 1 Application topically 2 (two) times daily.   omeprazole  20 MG tablet Commonly known as: PRILOSEC  OTC Take 20 mg by mouth 2 (two) times daily.   Ozempic  (0.25 or 0.5 MG/DOSE) 2 MG/3ML Sopn Generic drug: Semaglutide (0.25 or 0.5MG /DOS) Inject 0.5 mg into the skin once a week. What changed: See the new instructions. Changed by: Lucio Sabin Kallon Caylor   tiotropium 18 MCG inhalation capsule Commonly known as: Spiriva  HandiHaler place 1 CAPSULE into inhaler AND INHALE DAILY   Vimpat  150 MG Tabs Generic drug: Lacosamide  Take 300 mg by mouth 2 (two) times daily.   zonisamide  100 MG capsule Commonly known as: ZONEGRAN  Take 200 mg by mouth at bedtime.         Objective:   BP 136/83   Pulse 61   Ht 5' 6 (1.676 m)   Wt 231 lb (104.8 kg)   SpO2 95%   BMI 37.28 kg/m   Wt Readings from Last 3 Encounters:  07/31/23 231 lb (104.8 kg)  04/26/23 238 lb (108 kg)  01/21/23 246 lb (111.6 kg)    Physical Exam Vitals and nursing note reviewed.  Constitutional:      General: She is not in acute distress.    Appearance: She is well-developed. She is not diaphoretic.   Eyes:     Conjunctiva/sclera: Conjunctivae normal.    Cardiovascular:     Rate and Rhythm: Normal rate and regular rhythm.     Heart sounds: Normal heart sounds. No murmur heard. Pulmonary:     Effort: Pulmonary effort is normal. No respiratory distress.     Breath sounds: Normal breath sounds. No wheezing.   Musculoskeletal:        General: No swelling. Normal range of motion.   Skin:    General: Skin is warm and dry.     Findings: No rash.   Neurological:     Mental Status: She is alert and oriented to person, place, and time.     Coordination: Coordination normal.   Psychiatric:        Behavior: Behavior normal.     Results for orders placed  or performed in visit on 06/26/23  HM DIABETES EYE EXAM   Collection Time: 06/25/23  7:57 AM  Result Value Ref Range   HM Diabetic Eye Exam Retinopathy (A) No Retinopathy    Assessment & Plan:   Problem List Items Addressed This Visit       Cardiovascular and Mediastinum   Essential hypertension (Chronic)   Relevant Medications   nitroGLYCERIN  (NITROSTAT ) 0.4 MG SL tablet     Endocrine   Type 2 diabetes mellitus with other specified complication (HCC) - Primary   Relevant Medications   Semaglutide ,0.25 or 0.5MG /DOS, (OZEMPIC , 0.25 OR 0.5 MG/DOSE,) 2 MG/3ML SOPN   Other Relevant Orders   Bayer DCA Hb A1c Waived  Other   Hyperlipidemia with target LDL less than 70 -> CAD (Chronic)   Relevant Medications   nitroGLYCERIN  (NITROSTAT ) 0.4 MG SL tablet    A1c 6.2 and looks good.  No changes with that except for she does want to try and go up on the Ozempic  for weight loss purposes, she is going to try and go up to the 0.5 again and see if it does well for her. Follow up plan: Return in about 3 months (around 10/31/2023), or if symptoms worsen or fail to improve, for diabetes.  Counseling provided for all of the vaccine components Orders Placed This Encounter  Procedures   Bayer DCA Hb A1c Waived    Jolyne Needs, MD [facility] [place] [facility] 07/31/2023, 4:27 PM

## 2023-08-29 ENCOUNTER — Ambulatory Visit: Attending: Cardiology | Admitting: Cardiology

## 2023-08-29 ENCOUNTER — Encounter: Payer: Self-pay | Admitting: Cardiology

## 2023-08-29 VITALS — BP 132/80 | HR 57 | Ht 66.0 in | Wt 232.4 lb

## 2023-08-29 DIAGNOSIS — I10 Essential (primary) hypertension: Secondary | ICD-10-CM | POA: Diagnosis not present

## 2023-08-29 DIAGNOSIS — E782 Mixed hyperlipidemia: Secondary | ICD-10-CM

## 2023-08-29 DIAGNOSIS — I25119 Atherosclerotic heart disease of native coronary artery with unspecified angina pectoris: Secondary | ICD-10-CM

## 2023-08-29 NOTE — Patient Instructions (Addendum)

## 2023-08-29 NOTE — Progress Notes (Signed)
    Cardiology Office Note  Date: 08/29/2023   ID: Joycelyn, Liska 1955/03/18, MRN [id number]  History of Present Illness: LAIA WILEY is a 68 y.o. female last seen in July 2024.  She is here today with 2 of her granddaughters for a follow-up visit.  She does not report any progressive angina, uses nitroglycerin  rarely.  No increasing dyspnea on exertion.  She has had some right shoulder pain that sounds musculoskeletal based on description.  She states that acupuncture was helpful.  We went over her medications.  No change in cardiac regimen.  I rechecked her blood pressure today at 132/80.  She does have an arm cuff at home, I asked her to check readings before medical office visits to get a better sense of control.  She has had interval lab work with PCP, her LDL was 61 back in March.  I reviewed her ECG today which shows sinus bradycardia with prolonged PR interval and nonspecific T wave changes.  Physical Exam: VS:  BP 132/80 (BP Location: Left Arm)   Pulse (!) 57   Ht 5' 6 (1.676 m)   Wt 232 lb 6.4 oz (105.4 kg)   SpO2 99%   BMI 37.51 kg/m , BMI Body mass index is 37.51 kg/m.  Wt Readings from Last 3 Encounters:  08/29/23 232 lb 6.4 oz (105.4 kg)  07/31/23 231 lb (104.8 kg)  04/26/23 238 lb (108 kg)    General: Patient appears comfortable at rest. HEENT: Conjunctiva and lids normal. Neck: Supple, no elevated JVP or carotid bruits. Lungs: Clear to auscultation, nonlabored breathing at rest. Cardiac: Regular rate and rhythm, no S3 or significant systolic murmur. Extremities: No pitting edema.  ECG:  An ECG dated 09/04/2022 was personally reviewed today and demonstrated:  Sinus rhythm with lead motion artifact.  Labwork: 04/26/2023: ALT 6; AST 14; BUN 7; Creatinine, Ser 0.73; Hemoglobin 12.7; Platelets 228; Potassium 3.7; Sodium 140     Component Value Date/Time   CHOL 132 04/26/2023 1529   TRIG 102 04/26/2023 1529   HDL 52 04/26/2023 1529   CHOLHDL 2.5 04/26/2023  1529   CHOLHDL 3.9 02/11/2019 0211   VLDL 12 02/11/2019 0211   LDLCALC 61 04/26/2023 1529   LDLDIRECT 151 (H) 07/06/2015 1604   Other Studies Reviewed Today:  No interval cardiac testing for review today.  Assessment and Plan:  1.  CAD status post DES to the mid LAD with medically managed moderate first diagonal stenosis as of 2020.  Follow-up Lexiscan  Myoview  in August 2022 was low risk, no significant ischemia with LVEF 66%.  She reports no increasing angina, only rare nitroglycerin  use.  ECG reviewed.  Continue aspirin  81 mg daily, Lipitor 40 mg daily, and as needed nitroglycerin .  She is also on Ozempic .   2.  Primary hypertension.  Continue current regimen, currently on Coreg  3.125 mg twice daily and Cozaar  25 mg daily.   3.  Mixed hyperlipidemia.  LDL 61 in March.  Continue Lipitor 40 mg daily.  Disposition:  Follow up 1 year.  Signed, Jayson JUDITHANN Sierras, M.D., F.A.C.C. [facility]

## 2023-10-09 ENCOUNTER — Other Ambulatory Visit: Payer: Self-pay | Admitting: Cardiology

## 2023-10-31 ENCOUNTER — Encounter: Payer: Self-pay | Admitting: Family Medicine

## 2023-10-31 ENCOUNTER — Ambulatory Visit: Admitting: Family Medicine

## 2023-11-08 ENCOUNTER — Other Ambulatory Visit: Payer: Self-pay | Admitting: Family Medicine

## 2023-11-08 ENCOUNTER — Other Ambulatory Visit: Payer: Self-pay | Admitting: Cardiology

## 2023-11-22 ENCOUNTER — Ambulatory Visit (INDEPENDENT_AMBULATORY_CARE_PROVIDER_SITE_OTHER)

## 2023-11-22 VITALS — BP 132/80 | HR 57 | Ht 66.0 in | Wt 232.0 lb

## 2023-11-22 DIAGNOSIS — Z1211 Encounter for screening for malignant neoplasm of colon: Secondary | ICD-10-CM

## 2023-11-22 DIAGNOSIS — Z0000 Encounter for general adult medical examination without abnormal findings: Secondary | ICD-10-CM | POA: Diagnosis not present

## 2023-11-22 NOTE — Progress Notes (Signed)
 Subjective:   Courtney Mcconnell is a 68 y.o. who presents for a Medicare Wellness preventive visit.  As a reminder, Annual Wellness Visits don't include a physical exam, and some assessments may be limited, especially if this visit is performed virtually. We may recommend an in-person follow-up visit with your provider if needed.  Visit Complete: Virtual I connected with  Brettney W Bratz on 11/22/23 by a audio enabled telemedicine application and verified that I am speaking with the correct person using two identifiers.  Patient Location: Home  Provider Location: Home Office  I discussed the limitations of evaluation and management by telemedicine. The patient expressed understanding and agreed to proceed.  Vital Signs: Because this visit was a virtual/telehealth visit, some criteria may be missing or patient reported. Any vitals not documented were not able to be obtained and vitals that have been documented are patient reported.  VideoDeclined- This patient declined Librarian, academic. Therefore the visit was completed with audio only.  Persons Participating in Visit: Patient.  AWV Questionnaire: No: Patient Medicare AWV questionnaire was not completed prior to this visit.  Cardiac Risk Factors include: advanced age (>11men, >28 women);diabetes mellitus;dyslipidemia;hypertension;obesity (BMI >30kg/m2);smoking/ tobacco exposure     Objective:    Today's Vitals   11/22/23 1009 11/22/23 1011  BP: 132/80   Pulse: (!) 57   Weight: 232 lb (105.2 kg)   Height: 5' 6 (1.676 m)   PainSc:  5    Body mass index is 37.45 kg/m.     11/22/2023   10:15 AM 02/14/2022    3:21 PM 08/24/2021    1:05 PM 06/17/2021    6:00 PM 06/16/2021    4:57 PM 06/16/2021    4:00 PM 06/08/2021    8:19 AM  Advanced Directives  Does Patient Have a Medical Advance Directive? No Yes No No  No Yes  Type of Advance Directive  Living will;Healthcare Power of [employer] Power of  [employer] Power of Attorney  Does patient want to make changes to medical advance directive?  No - Patient declined  No - Patient declined   No - Patient declined  Copy of Healthcare Power of Attorney in Chart?  Yes - validated most recent copy scanned in chart (See row information)  No - copy requested   No - copy requested  Would patient like information on creating a medical advance directive?    No - Patient declined No - Patient declined      Current Medications (verified) Outpatient Encounter Medications as of 11/22/2023  Medication Sig   albuterol  (VENTOLIN  HFA) 108 (90 Base) MCG/ACT inhaler INHALE TWO PUFFS BY MOUTH EVERY 6 HOURS AS NEEDED FOR WHEEZING, SHORTNESS OF BREATH   aspirin  EC 81 MG tablet Take 81 mg by mouth daily. Swallow whole.   atorvastatin  (LIPITOR) 40 MG tablet TAKE 1 TABLET BY MOUTH EVERY DAY AT 6PM   carvedilol  (COREG ) 3.125 MG tablet TAKE 1 TABLET BY MOUTH TWICE DAILY WITH A MEAL   cetirizine  (ZYRTEC  ALLERGY) 10 MG tablet Take 1 tablet (10 mg total) by mouth daily for 14 days.   diclofenac  Sodium (VOLTAREN ) 1 % GEL Apply 2 g topically 4 (four) times daily.   fluticasone -salmeterol (ADVAIR) 100-50 MCG/ACT AEPB Inhale 1 puff into the lungs 2 (two) times daily.   Lacosamide  (VIMPAT ) 150 MG TABS Take 300 mg by mouth 2 (two) times daily.   losartan  (COZAAR ) 25 MG tablet TAKE 1 TABLET BY MOUTH EVERY DAY  mupirocin  ointment (BACTROBAN ) 2 % Apply 1 Application topically 2 (two) times daily.   nitroGLYCERIN  (NITROSTAT ) 0.4 MG SL tablet DISSOLVE 1 TABLET UNDER THE TONGUE EVERY 5 MINUTES AS NEEDED FOR CHEST PAIN. DO NOT EXCEED A TOTAL OF 3 DOSES IN 15 MINUTES.   nystatin  cream (MYCOSTATIN ) Apply 1 Application topically 2 (two) times daily.   omeprazole  (PRILOSEC  OTC) 20 MG tablet Take 20 mg by mouth 2 (two) times daily.   Semaglutide ,0.25 or 0.5MG /DOS, (OZEMPIC , 0.25 OR 0.5 MG/DOSE,) 2 MG/3ML SOPN Inject 0.5 mg into the skin once a week.   tiotropium (SPIRIVA   HANDIHALER) 18 MCG inhalation capsule place 1 CAPSULE into inhaler AND INHALE DAILY   zonisamide  (ZONEGRAN ) 100 MG capsule Take 200 mg by mouth at bedtime.   No facility-administered encounter medications on file as of 11/22/2023.    Allergies (verified) Penicillins, Bee venom, Prednisone , Augmentin  [amoxicillin -pot clavulanate], and Doxycycline  hyclate   History: Past Medical History:  Diagnosis Date   Asthma    Bursitis    Hip   CAD (coronary artery disease) 02/11/2019   PCI/DES x1 to mLAD, focal ostial 70% lesion in 1st diag   COPD (chronic obstructive pulmonary disease) (HCC)    Essential hypertension    GERD (gastroesophageal reflux disease)    Headache(784.0)    Hyperlipidemia    NSTEMI (non-ST elevated myocardial infarction) (HCC) 02/11/2019   Pneumonia    Seizure (HCC)    Sleep apnea    Past Surgical History:  Procedure Laterality Date   ABDOMINAL HYSTERECTOMY     APPENDECTOMY     CATARACT EXTRACTION Bilateral    CHOLECYSTECTOMY     CORONARY PRESSURE/FFR STUDY N/A 02/11/2019   Procedure: INTRAVASCULAR PRESSURE WIRE/FFR STUDY;  Surgeon: Verlin Lonni BIRCH, MD;  Location: [facility];  Service: Cardiovascular;  Laterality: N/A;   CORONARY STENT INTERVENTION N/A 02/11/2019   Procedure: CORONARY STENT INTERVENTION;  Surgeon: Verlin Lonni BIRCH, MD;  Location: [facility];; mid LAD 70% -- DES PCI (Synergy DES 2.5 x 28 --2.8 mm)   heal spur     right   LASER PHOTO ABLATION Right 08/27/2013   Procedure: LASER PHOTO ABLATION;  Surgeon: Norleen BIRCH Ku, MD;  Location: [facility] OR;  Service: Ophthalmology;  Laterality: Right;  Headscope laser AND Endolaser   LEFT HEART CATH AND CORONARY ANGIOGRAPHY N/A 02/11/2019   Procedure: LEFT HEART CATH AND CORONARY ANGIOGRAPHY;  Surgeon: Verlin Lonni BIRCH, MD;  Location: [facility];;; prox RCA 20%. Ost-prox Cx 20%. ostial D1 70% & mLAD 70% => (DES PCI of LAD)   MEMBRANE PEEL Right 08/27/2013   Procedure:  MEMBRANE PEEL;  Surgeon: Norleen BIRCH Ku, MD;  Location: [facility] OR;  Service: Ophthalmology;  Laterality: Right;   PARS PLANA VITRECTOMY Right 08/27/2013   Procedure: PARS PLANA VITRECTOMY WITH 25 GAUGE;  Surgeon: Norleen BIRCH Ku, MD;  Location: [facility] OR;  Service: Ophthalmology;  Laterality: Right;   TOTAL KNEE ARTHROPLASTY Right 01/09/2021   Procedure: RIGHT TOTAL KNEE ARTHROPLASTY;  Surgeon: Barbarann Oneil BROCKS, MD;  Location: [facility] OR;  Service: Orthopedics;  Laterality: Right;  Needs RNFA   TOTAL KNEE ARTHROPLASTY Left 06/16/2021   Procedure: LEFT TOTAL KNEE ARTHROPLASTY;  Surgeon: Barbarann Oneil BROCKS, MD;  Location: [facility] OR;  Service: Orthopedics;  Laterality: Left;   Family History  Problem Relation Age of Onset   COPD Mother    Diabetes Father    Hypertension Father    Hyperlipidemia Father    Cancer Sister  Bone Cancer   Healthy Daughter    Cancer Paternal Uncle    Stroke Paternal Grandmother    Diabetes Brother    Cancer Brother    Healthy Son    Colon cancer Neg Hx    Breast cancer Neg Hx    Social History   Socioeconomic History   Marital status: Widowed    Spouse name: [name]   Number of children: 2   Years of education: GED   Highest education level: Not on file  Occupational History   Occupation: COOK    Employer: [employer]  Tobacco Use   Smoking status: Former    Current packs/day: 0.00    Average packs/day: 0.3 packs/day for 42.0 years (10.5 ttl pk-yrs)    Types: Cigarettes    Start date: 03/20/1972    Quit date: 03/20/2014    Years since quitting: 9.6   Smokeless tobacco: Never   Tobacco comments:    01/21/15 restarted smoking 4 mos ago  Vaping Use   Vaping status: Never Used  Substance and Sexual Activity   Alcohol use: No    Alcohol/week: 0.0 standard drinks of alcohol   Drug use: No   Sexual activity: Not on file  Other Topics Concern   Not on file  Social History Narrative   Patient lives at home with family.   Caffeine Use: 1 pot and a half a day    Social Drivers of Corporate investment banker Strain: Low Risk  (11/22/2023)   Overall Financial Resource Strain (CARDIA)    Difficulty of Paying Living Expenses: Not hard at all  Food Insecurity: No Food Insecurity (11/22/2023)   Hunger Vital Sign    Worried About Running Out of Food in the Last Year: Never true    Ran Out of Food in the Last Year: Never true  Transportation Needs: No Transportation Needs (11/22/2023)   PRAPARE - Administrator, Civil Service (Medical): No    Lack of Transportation (Non-Medical): No  Physical Activity: Inactive (11/22/2023)   Exercise Vital Sign    Days of Exercise per Week: 0 days    Minutes of Exercise per Session: 0 min  Stress: No Stress Concern Present (11/22/2023)   [employer] of Occupational Health - Occupational Stress Questionnaire    Feeling of Stress: Not at all  Social Connections: Socially Isolated (11/22/2023)   Social Connection and Isolation Panel    Frequency of Communication with Friends and Family: Once a week    Frequency of Social Gatherings with Friends and Family: Once a week    Attends Religious Services: Never    Database administrator or Organizations: No    Attends Banker Meetings: Never    Marital Status: Widowed    Tobacco Counseling Counseling given: Yes Tobacco comments: 01/21/15 restarted smoking 4 mos ago    Clinical Intake:  Pre-visit preparation completed: Yes  Pain : 0-10 (art in both hands/arms/shoulder) Pain Score: 5  Pain Type: Chronic pain Pain Location: Hand (hands/arms/shoulder) Pain Orientation: Left, Right, Upper Pain Radiating Towards: alieve Pain Descriptors / Indicators: Constant, Aching Pain Onset: Other (comment) Pain Frequency: Constant Pain Relieving Factors: Alieve  Pain Relieving Factors: Alieve  BMI - recorded: 37.45 Nutritional Status: BMI > 30  Obese Nutritional Risks: None Diabetes: Yes  Lab Results  Component Value Date   HGBA1C  6.2 (H) 07/31/2023   HGBA1C 6.0 (H) 04/26/2023   HGBA1C 6.7 (H) 01/21/2023     How often  do you need to have someone help you when you read instructions, pamphlets, or other written materials from your doctor or pharmacy?: 1 - Never  Interpreter Needed?: No  Information entered by :: alia t/cma   Activities of Daily Living     11/22/2023   10:13 AM  In your present state of health, do you have any difficulty performing the following activities:  Hearing? 0  Vision? 0  Difficulty concentrating or making decisions? 0  Walking or climbing stairs? 0  Dressing or bathing? 0  Doing errands, shopping? 1  Comment pt's Press photographer and eating ? N  Using the Toilet? N  In the past six months, have you accidently leaked urine? N  Do you have problems with loss of bowel control? N  Managing your Medications? N  Managing your Finances? N  Housekeeping or managing your Housekeeping? N    Patient Care Team: Dettinger, Fonda LABOR, MD as PCP - General (Family Medicine) Debera Jayson MATSU, MD as PCP - Cardiology (Cardiology)  I have updated your Care Teams any recent Medical Services you may have received from other providers in the past year.     Assessment:   This is a routine wellness examination for Marielouise.  Hearing/Vision screen No results found.   Goals Addressed   None    Depression Screen     11/22/2023   10:17 AM 07/31/2023    3:23 PM 04/26/2023    3:40 PM 10/18/2022    3:50 PM 05/23/2022    1:27 PM 02/14/2022    3:20 PM 02/13/2022    9:27 AM  PHQ 2/9 Scores  PHQ - 2 Score 0 0 0 0 0 0 0  PHQ- 9 Score    0   0    Fall Risk     11/22/2023   10:10 AM 07/31/2023    3:23 PM 04/26/2023    3:40 PM 10/18/2022    3:49 PM 05/23/2022    1:27 PM  Fall Risk   Falls in the past year? 0 0 0 0 0  Number falls in past yr: 0 0     Injury with Fall? 0 0     Risk for fall due to : No Fall Risks No Fall Risks     Follow up Falls evaluation completed Falls evaluation  completed       MEDICARE RISK AT HOME:  Medicare Risk at Home Any stairs in or around the home?: No If so, are there any without handrails?: No Home free of loose throw rugs in walkways, pet beds, electrical cords, etc?: Yes Adequate lighting in your home to reduce risk of falls?: Yes Life alert?: No Use of a cane, walker or w/c?: No Grab bars in the bathroom?: No Shower chair or bench in shower?: No Elevated toilet seat or a handicapped toilet?: Yes  TIMED UP AND GO:  Was the test performed?  No  Cognitive Function: 6CIT completed        11/22/2023   10:15 AM 02/14/2022    3:21 PM 02/08/2021    4:31 PM  6CIT Screen  What Year? 0 points 0 points 0 points  What month? 0 points 0 points 0 points  What time? 0 points 0 points 0 points  Count back from 20 0 points 0 points 0 points  Months in reverse 0 points 0 points 2 points  Repeat phrase 0 points 2 points 2 points  Total Score 0 points 2 points 4  points    Immunizations Immunization History  Administered Date(s) Administered   MMR 08/26/2007   Td 08/11/2007   Tdap 08/12/2014    Screening Tests Health Maintenance  Topic Date Due   COVID-19 Vaccine (1) Never done   Pneumococcal Vaccine: 50+ Years (1 of 2 - PCV) Never done   Colonoscopy  Never done   Influenza Vaccine  Never done   Zoster Vaccines- Shingrix (1 of 2) 10/30/2024 (Originally 10/07/1974)   FOOT EXAM  01/21/2024   HEMOGLOBIN A1C  01/30/2024   Diabetic kidney evaluation - eGFR measurement  04/25/2024   Diabetic kidney evaluation - Urine ACR  04/25/2024   Mammogram  06/11/2024   OPHTHALMOLOGY EXAM  06/24/2024   DTaP/Tdap/Td (3 - Td or Tdap) 08/11/2024   Medicare Annual Wellness (AWV)  11/21/2024   DEXA SCAN  Completed   Hepatitis C Screening  Completed   Meningococcal B Vaccine  Aged Out    Health Maintenance Items Addressed: See Nurse Notes at the end of this note  Additional Screening:  Vision Screening: Recommended annual ophthalmology  exams for early detection of glaucoma and other disorders of the eye. Is the patient up to date with their annual eye exam?  Yes  Who is the provider or what is the name of the office in which the patient attends annual eye exams? [facility]  Dental Screening: Recommended annual dental exams for proper oral hygiene  Community Resource Referral / Chronic Care Management: CRR required this visit?  No   CCM required this visit?  No   Plan:    I have personally reviewed and noted the following in the patient's chart:   Medical and social history Use of alcohol, tobacco or illicit drugs  Current medications and supplements including opioid prescriptions. Patient is not currently taking opioid prescriptions. Functional ability and status Nutritional status Physical activity Advanced directives List of other physicians Hospitalizations, surgeries, and ER visits in previous 12 months Vitals Screenings to include cognitive, depression, and falls Referrals and appointments  In addition, I have reviewed and discussed with patient certain preventive protocols, quality metrics, and best practice recommendations. A written personalized care plan for preventive services as well as general preventive health recommendations were provided to patient.   Ozie Ned, CMA   11/22/2023   After Visit Summary: (MyChart) Due to this being a telephonic visit, the after visit summary with patients personalized plan was offered to patient via MyChart   Notes: Nothing significant to report at this time.

## 2023-11-22 NOTE — Patient Instructions (Signed)
 Ms. Cales,  Thank you for taking the time for your Medicare Wellness Visit. I appreciate your continued commitment to your health goals. Please review the care plan we discussed, and feel free to reach out if I can assist you further.  Medicare recommends these wellness visits once per year to help you and your care team stay ahead of potential health issues. These visits are designed to focus on prevention, allowing your provider to concentrate on managing your acute and chronic conditions during your regular appointments.  Please note that Annual Wellness Visits do not include a physical exam. Some assessments may be limited, especially if the visit was conducted virtually. If needed, we may recommend a separate in-person follow-up with your provider.  Ongoing Care Seeing your primary care provider every 3 to 6 months helps us  monitor your health and provide consistent, personalized care.   Referrals If a referral was made during today's visit and you haven't received any updates within two weeks, please contact the referred provider directly to check on the status.  Recommended Screenings:  Health Maintenance  Topic Date Due   COVID-19 Vaccine (1) Never done   Pneumococcal Vaccine for age over 42 (1 of 2 - PCV) Never done   Colon Cancer Screening  Never done   Medicare Annual Wellness Visit  02/15/2023   Flu Shot  Never done   Zoster (Shingles) Vaccine (1 of 2) 10/30/2024*   Complete foot exam   01/21/2024   Hemoglobin A1C  01/30/2024   Yearly kidney function blood test for diabetes  04/25/2024   Yearly kidney health urinalysis for diabetes  04/25/2024   Breast Cancer Screening  06/11/2024   Eye exam for diabetics  06/24/2024   DTaP/Tdap/Td vaccine (3 - Td or Tdap) 08/11/2024   DEXA scan (bone density measurement)  Completed   Hepatitis C Screening  Completed   Meningitis B Vaccine  Aged Out  *Topic was postponed. The date shown is not the original due date.       11/22/2023    10:15 AM  Advanced Directives  Does Patient Have a Medical Advance Directive? No   Advance Care Planning is important because it: Ensures you receive medical care that aligns with your values, goals, and preferences. Provides guidance to your family and loved ones, reducing the emotional burden of decision-making during critical moments.  Vision: Annual vision screenings are recommended for early detection of glaucoma, cataracts, and diabetic retinopathy. These exams can also reveal signs of chronic conditions such as diabetes and high blood pressure.  Dental: Annual dental screenings help detect early signs of oral cancer, gum disease, and other conditions linked to overall health, including heart disease and diabetes.  Please see the attached documents for additional preventive care recommendations.

## 2023-12-04 ENCOUNTER — Encounter: Payer: Self-pay | Admitting: Family Medicine

## 2023-12-04 ENCOUNTER — Ambulatory Visit (INDEPENDENT_AMBULATORY_CARE_PROVIDER_SITE_OTHER): Admitting: Family Medicine

## 2023-12-04 VITALS — BP 159/78 | HR 60 | Temp 98.2°F | Ht 66.0 in | Wt 226.4 lb

## 2023-12-04 DIAGNOSIS — L98431 Non-pressure chronic ulcer of abdomen limited to breakdown of skin: Secondary | ICD-10-CM | POA: Diagnosis not present

## 2023-12-04 MED ORDER — MUPIROCIN 2 % EX OINT: TOPICAL_OINTMENT | CUTANEOUS | 1 refills | Status: AC

## 2023-12-04 MED ORDER — CIPROFLOXACIN HCL 500 MG PO TABS: 500.0000 mg | ORAL_TABLET | Freq: Two times a day (BID) | ORAL | 0 refills | Status: AC

## 2023-12-04 NOTE — Progress Notes (Signed)
 Subjective:  Patient ID: Courtney Mcconnell, female    DOB: 1955/04/12  Age: 68 y.o. MRN: [id number]  CC: wound (Won't heal/Between belly and genitals)   HPI  Discussed the use of AI scribe software for clinical note transcription with the patient, who gave verbal consent to proceed.  History of Present Illness Courtney Mcconnell is a 68 year old female who presents with a draining skin abscess on her abdomen.  The abscess appeared spontaneously approximately two weeks ago and began draining yellow-green pus about a week ago. She attempted to manage the abscess with a Band-Aid, which she believes may have worsened the condition.  The abscess is located on her abdomen in an area that tends to hold moisture, which may have contributed to the infection. She notes that the area is under pressure, complicating management. No fever or other systemic symptoms are present.  She has not mentioned any specific past medical history related to this condition, nor any current medications she is taking for it.          12/04/2023    1:36 PM 11/22/2023   10:17 AM 07/31/2023    3:37 PM  Depression screen PHQ 2/9  Decreased Interest 0 0   Down, Depressed, Hopeless 0 0   PHQ - 2 Score 0 0   Altered sleeping   0  Tired, decreased energy   0  Change in appetite   0  Feeling bad or failure about yourself    0  Trouble concentrating   0  Moving slowly or fidgety/restless   0  Suicidal thoughts   0  Difficult doing work/chores   Not difficult at all    History Courtney Mcconnell has a past medical history of Asthma, Bursitis, CAD (coronary artery disease) (02/11/2019), COPD (chronic obstructive pulmonary disease) (HCC), Essential hypertension, GERD (gastroesophageal reflux disease), Headache(784.0), Hyperlipidemia, NSTEMI (non-ST elevated myocardial infarction) (HCC) (02/11/2019), Pneumonia, Seizure (HCC), and Sleep apnea.   She has a past surgical history that includes Abdominal hysterectomy; Cholecystectomy;  Appendectomy; Cataract extraction (Bilateral); Pars plana vitrectomy (Right, 08/27/2013); Membrane peel (Right, 08/27/2013); Laser photo ablation (Right, 08/27/2013); heal spur; LEFT HEART CATH AND CORONARY ANGIOGRAPHY (N/A, 02/11/2019); CORONARY STENT INTERVENTION (N/A, 02/11/2019); CORONARY PRESSURE/FFR STUDY (N/A, 02/11/2019); Total knee arthroplasty (Right, 01/09/2021); and Total knee arthroplasty (Left, 06/16/2021).   Her family history includes COPD in her mother; Cancer in her brother, paternal uncle, and sister; Diabetes in her brother and father; Healthy in her daughter and son; Hyperlipidemia in her father; Hypertension in her father; Stroke in her paternal grandmother.She reports that she quit smoking about 9 years ago. Her smoking use included cigarettes. She started smoking about 51 years ago. She has a 10.5 pack-year smoking history. She has never used smokeless tobacco. She reports that she does not drink alcohol and does not use drugs.    ROS Review of Systems  Constitutional: Negative.   HENT: Negative.    Eyes:  Negative for visual disturbance.  Respiratory:  Negative for shortness of breath.   Cardiovascular:  Negative for chest pain.  Gastrointestinal:  Negative for abdominal pain.  Musculoskeletal:  Negative for arthralgias.    Objective:  BP (!) 159/78   Pulse 60   Temp 98.2 F (36.8 C)   Ht 5' 6 (1.676 m)   Wt 226 lb 6.4 oz (102.7 kg)   SpO2 95%   BMI 36.54 kg/m   BP Readings from Last 3 Encounters:  12/04/23 (!) 159/78  11/22/23 132/80  08/29/23 132/80  Wt Readings from Last 3 Encounters:  12/04/23 226 lb 6.4 oz (102.7 kg)  11/22/23 232 lb (105.2 kg)  08/29/23 232 lb 6.4 oz (105.4 kg)     Physical Exam Physical Exam GENERAL: Alert, cooperative, well developed, no acute distress. HEENT: Normocephalic, normal oropharynx, moist mucous membranes. CHEST: Clear to auscultation bilaterally, no wheezes, rhonchi, or crackles. CARDIOVASCULAR: Normal heart  rate and rhythm, S1 and S2 normal without murmurs. ABDOMEN: Soft, non-tender, non-distended, without organomegaly, normal bowel sounds. EXTREMITIES: No cyanosis or edema. NEUROLOGICAL: Cranial nerves grossly intact, moves all extremities without gross motor or sensory deficit. SKIN: Skin abscess on abdomen with yellow-green drainage.   Assessment & Plan:  Non-pressure chronic ulcer of abdomen limited to breakdown of skin  Other orders -     Ciprofloxacin  HCl; Take 1 tablet (500 mg total) by mouth 2 (two) times daily.  Dispense: 20 tablet; Refill: 0 -     Mupirocin ; Apply and cover with bandage twice daily  Dispense: 30 g; Refill: 1    Assessment and Plan Assessment & Plan Skin abscess of abdominal wall   A skin abscess on the abdominal wall is draining yellow-green fluid, indicating infection. It has been present for two weeks and began draining a week ago. Its location in a skin fold may contribute to moisture retention and bacterial growth, worsening the condition. The abscess is not as deep or severe as initially thought. Clean the area with warm soapy water and apply the prescribed ointment. Prescribe oral antibiotics to treat the infection. Have the nurse apply a bandage to the area.       Follow-up: Return in about 1 week (around 12/11/2023), or if symptoms worsen or fail to improve.  Butler Der, M.D.

## 2023-12-10 ENCOUNTER — Encounter: Payer: Self-pay | Admitting: Family Medicine

## 2023-12-17 ENCOUNTER — Encounter: Payer: Self-pay | Admitting: Family Medicine

## 2023-12-19 ENCOUNTER — Ambulatory Visit: Admitting: Family Medicine

## 2023-12-19 ENCOUNTER — Ambulatory Visit (INDEPENDENT_AMBULATORY_CARE_PROVIDER_SITE_OTHER): Admitting: Family Medicine

## 2023-12-19 ENCOUNTER — Encounter: Payer: Self-pay | Admitting: Family Medicine

## 2023-12-19 VITALS — BP 153/86 | HR 64 | Ht 66.0 in | Wt 226.0 lb

## 2023-12-19 DIAGNOSIS — R4781 Slurred speech: Secondary | ICD-10-CM | POA: Diagnosis not present

## 2023-12-19 DIAGNOSIS — R531 Weakness: Secondary | ICD-10-CM | POA: Diagnosis not present

## 2023-12-19 DIAGNOSIS — H53131 Sudden visual loss, right eye: Secondary | ICD-10-CM

## 2023-12-19 DIAGNOSIS — R2681 Unsteadiness on feet: Secondary | ICD-10-CM

## 2023-12-19 NOTE — Progress Notes (Signed)
 BP (!) 153/86   Pulse 64   Ht 5' 6 (1.676 m)   Wt 226 lb (102.5 kg)   SpO2 98%   BMI 36.48 kg/m    Subjective:   Patient ID: Courtney Mcconnell, female    DOB: 17-Aug-1955, 68 y.o.   MRN: [id number]  HPI: Courtney Mcconnell is a 68 y.o. female presenting on 12/19/2023 for right arm (Pain and numbness) and leg numbness (right)   Discussed the use of AI scribe software for clinical note transcription with the patient, who gave verbal consent to proceed.  History of Present Illness   Courtney Mcconnell is a 68 year old female with a history of seizures who presents with right-sided weakness and numbness.  Right upper extremity neuropathy and weakness - Numbness and pain in the right arm for approximately one month - Pain originates at the right shoulder and radiates down to the fingers - Pain is constant without fluctuation - No recent trauma to the neck or shoulder - Partial relief with Aleve , but pain persists, especially with reaching up - Difficulty gripping objects, making tasks such as buttoning a shirt challenging  Right lower extremity neuropathy and weakness - Numbness and weakness in the right leg for approximately three weeks - Numbness extends from the upper leg to the mid-calf, primarily affecting the anterior aspect - Balance issues with near falls while walking - Right leg feels weak and occasionally gives out  Visual disturbance - Blurry vision in the right eye for the past two days - No associated pain with blurry vision - Significant blurriness in the right eye when the left eye is closed, making it difficult to see  Speech disturbance - Slurred speech noted since onset of symptoms three weeks ago  Seizure disorder - History of seizures - Seizures previously investigated with a scan in 2020 - Stress and emotional upset are known triggers for seizures          Relevant past medical, surgical, family and social history reviewed and updated as indicated. Interim  medical history since our last visit reviewed. Allergies and medications reviewed and updated.  Review of Systems  Per HPI unless specifically indicated above   Allergies as of 12/19/2023       Reactions   Penicillins Hives   Bee Venom Swelling   Prednisone  Other (See Comments)   Patient did not like how it made her feel, caused jittery feeling.    Augmentin  [amoxicillin -pot Clavulanate] Rash   Doxycycline  Hyclate Itching, Rash        Medication List        Accurate as of December 19, 2023  4:15 PM. If you have any questions, ask your nurse or doctor.          albuterol  108 (90 Base) MCG/ACT inhaler Commonly known as: VENTOLIN  HFA INHALE TWO PUFFS BY MOUTH EVERY 6 HOURS AS NEEDED FOR WHEEZING, SHORTNESS OF BREATH   aspirin  EC 81 MG tablet Take 81 mg by mouth daily. Swallow whole.   atorvastatin  40 MG tablet Commonly known as: LIPITOR TAKE 1 TABLET BY MOUTH EVERY DAY AT 6PM   carvedilol  3.125 MG tablet Commonly known as: COREG  TAKE 1 TABLET BY MOUTH TWICE DAILY WITH A MEAL   cetirizine  10 MG tablet Commonly known as: ZyrTEC  Allergy Take 1 tablet (10 mg total) by mouth daily for 14 days.   ciprofloxacin  500 MG tablet Commonly known as: Cipro  Take 1 tablet (500 mg total) by mouth 2 (two) times  daily.   diclofenac  Sodium 1 % Gel Commonly known as: Voltaren  Apply 2 g topically 4 (four) times daily.   fluticasone -salmeterol 100-50 MCG/ACT Aepb Commonly known as: ADVAIR Inhale 1 puff into the lungs 2 (two) times daily.   losartan  25 MG tablet Commonly known as: COZAAR  TAKE 1 TABLET BY MOUTH EVERY DAY   mupirocin  ointment 2 % Commonly known as: BACTROBAN  Apply and cover with bandage twice daily   nitroGLYCERIN  0.4 MG SL tablet Commonly known as: NITROSTAT  DISSOLVE 1 TABLET UNDER THE TONGUE EVERY 5 MINUTES AS NEEDED FOR CHEST PAIN. DO NOT EXCEED A TOTAL OF 3 DOSES IN 15 MINUTES.   nystatin  cream Commonly known as: MYCOSTATIN  Apply 1 Application  topically 2 (two) times daily.   omeprazole  20 MG tablet Commonly known as: PRILOSEC  OTC Take 20 mg by mouth 2 (two) times daily.   Ozempic  (0.25 or 0.5 MG/DOSE) 2 MG/3ML Sopn Generic drug: Semaglutide (0.25 or 0.5MG /DOS) Inject 0.5 mg into the skin once a week.   tiotropium 18 MCG inhalation capsule Commonly known as: Spiriva  HandiHaler place 1 CAPSULE into inhaler AND INHALE DAILY   Vimpat  150 MG Tabs Generic drug: Lacosamide  Take 300 mg by mouth 2 (two) times daily.   zonisamide  100 MG capsule Commonly known as: ZONEGRAN  Take 200 mg by mouth at bedtime.         Objective:   BP (!) 153/86   Pulse 64   Ht 5' 6 (1.676 m)   Wt 226 lb (102.5 kg)   SpO2 98%   BMI 36.48 kg/m   Wt Readings from Last 3 Encounters:  12/19/23 226 lb (102.5 kg)  12/04/23 226 lb 6.4 oz (102.7 kg)  11/22/23 232 lb (105.2 kg)    Physical Exam Physical Exam   NEUROLOGICAL: Right eyelid ptosis, decreased sensation on right side of face and arm, right-sided weakness.       Results for orders placed or performed in visit on 07/31/23  Bayer DCA Hb A1c Waived   Collection Time: 07/31/23  3:22 PM  Result Value Ref Range   HB A1C (BAYER DCA - WAIVED) 6.2 (H) 4.8 - 5.6 %    Assessment & Plan:   Problem List Items Addressed This Visit   None Visit Diagnoses       Right sided weakness    -  Primary   Relevant Orders   MR Brain Wo Contrast     Acute visual loss, right       Relevant Orders   MR Brain Wo Contrast     Gait instability       Relevant Orders   MR Brain Wo Contrast     Slurred speech       Relevant Orders   MR Brain Wo Contrast           Acute right-sided weakness, numbness, and sensory loss with right upper extremity pain and right eye visual disturbance Acute right-sided symptoms suggest possible stroke. Differential includes seizure-related events, but acute presentation prioritizes stroke evaluation. - Ordered MRI of the brain to evaluate for acute changes.           Follow up plan: Return if symptoms worsen or fail to improve.  Counseling provided for all of the vaccine components Orders Placed This Encounter  Procedures   MR Brain Wo Contrast    Fonda Levins, MD [facility] 12/19/2023, 4:15 PM

## 2023-12-20 ENCOUNTER — Ambulatory Visit: Payer: Self-pay | Admitting: Family Medicine

## 2023-12-20 ENCOUNTER — Ambulatory Visit (HOSPITAL_COMMUNITY)
Admission: RE | Admit: 2023-12-20 | Discharge: 2023-12-20 | Disposition: A | Discharge status: Home / Self Care | Source: Ambulatory Visit | Attending: Family Medicine | Admitting: Family Medicine

## 2023-12-20 DIAGNOSIS — R2681 Unsteadiness on feet: Secondary | ICD-10-CM | POA: Diagnosis present

## 2023-12-20 DIAGNOSIS — R531 Weakness: Secondary | ICD-10-CM | POA: Insufficient documentation

## 2023-12-20 DIAGNOSIS — H53131 Sudden visual loss, right eye: Secondary | ICD-10-CM | POA: Insufficient documentation

## 2023-12-20 DIAGNOSIS — R4781 Slurred speech: Secondary | ICD-10-CM | POA: Diagnosis present

## 2023-12-24 ENCOUNTER — Other Ambulatory Visit: Payer: Self-pay | Admitting: Family Medicine

## 2023-12-24 DIAGNOSIS — E1169 Type 2 diabetes mellitus with other specified complication: Secondary | ICD-10-CM

## 2024-01-27 ENCOUNTER — Other Ambulatory Visit: Payer: Self-pay

## 2024-01-27 ENCOUNTER — Encounter (HOSPITAL_COMMUNITY): Payer: Self-pay

## 2024-01-27 ENCOUNTER — Emergency Department (HOSPITAL_COMMUNITY)
Admission: EM | Admit: 2024-01-27 | Discharge: 2024-01-27 | Disposition: A | Discharge status: Home / Self Care | Attending: Emergency Medicine | Admitting: Emergency Medicine

## 2024-01-27 ENCOUNTER — Emergency Department (HOSPITAL_COMMUNITY)

## 2024-01-27 DIAGNOSIS — E119 Type 2 diabetes mellitus without complications: Secondary | ICD-10-CM | POA: Diagnosis not present

## 2024-01-27 DIAGNOSIS — R0789 Other chest pain: Secondary | ICD-10-CM

## 2024-01-27 DIAGNOSIS — I10 Essential (primary) hypertension: Secondary | ICD-10-CM | POA: Diagnosis not present

## 2024-01-27 DIAGNOSIS — I2510 Atherosclerotic heart disease of native coronary artery without angina pectoris: Secondary | ICD-10-CM | POA: Diagnosis not present

## 2024-01-27 DIAGNOSIS — Z7951 Long term (current) use of inhaled steroids: Secondary | ICD-10-CM | POA: Diagnosis not present

## 2024-01-27 DIAGNOSIS — Z79899 Other long term (current) drug therapy: Secondary | ICD-10-CM | POA: Diagnosis not present

## 2024-01-27 DIAGNOSIS — Z951 Presence of aortocoronary bypass graft: Secondary | ICD-10-CM | POA: Diagnosis not present

## 2024-01-27 DIAGNOSIS — Z7982 Long term (current) use of aspirin: Secondary | ICD-10-CM | POA: Diagnosis not present

## 2024-01-27 DIAGNOSIS — Z8673 Personal history of transient ischemic attack (TIA), and cerebral infarction without residual deficits: Secondary | ICD-10-CM | POA: Diagnosis not present

## 2024-01-27 DIAGNOSIS — J449 Chronic obstructive pulmonary disease, unspecified: Secondary | ICD-10-CM | POA: Diagnosis not present

## 2024-01-27 LAB — CBC WITH DIFFERENTIAL/PLATELET
Abs Immature Granulocytes: 0.01 K/uL (ref 0.00–0.07)
Basophils Absolute: 0 K/uL (ref 0.0–0.1)
Basophils Relative: 1 %
Eosinophils Absolute: 0.1 K/uL (ref 0.0–0.5)
Eosinophils Relative: 2 %
HCT: 39.5 % (ref 36.0–46.0)
Hemoglobin: 12.8 g/dL (ref 12.0–15.0)
Immature Granulocytes: 0 %
Lymphocytes Relative: 30 %
Lymphs Abs: 1.9 K/uL (ref 0.7–4.0)
MCH: 32.5 pg (ref 26.0–34.0)
MCHC: 32.4 g/dL (ref 30.0–36.0)
MCV: 100.3 fL — ABNORMAL HIGH (ref 80.0–100.0)
Monocytes Absolute: 0.7 K/uL (ref 0.1–1.0)
Monocytes Relative: 11 %
Neutro Abs: 3.5 K/uL (ref 1.7–7.7)
Neutrophils Relative %: 56 %
Platelets: 210 K/uL (ref 150–400)
RBC: 3.94 MIL/uL (ref 3.87–5.11)
RDW: 13.2 % (ref 11.5–15.5)
WBC: 6.2 K/uL (ref 4.0–10.5)
nRBC: 0 % (ref 0.0–0.2)

## 2024-01-27 LAB — BASIC METABOLIC PANEL WITH GFR
Anion gap: 13 (ref 5–15)
BUN: 13 mg/dL (ref 8–23)
CO2: 22 mmol/L (ref 22–32)
Calcium: 9.1 mg/dL (ref 8.9–10.3)
Chloride: 104 mmol/L (ref 98–111)
Creatinine, Ser: 0.78 mg/dL (ref 0.44–1.00)
GFR, Estimated: >60 mL/min (ref >60)
Glucose, Bld: 125 mg/dL — ABNORMAL HIGH (ref 70–99)
Potassium: 3.5 mmol/L (ref 3.5–5.1)
Sodium: 139 mmol/L (ref 135–145)

## 2024-01-27 LAB — TROPONIN T, HIGH SENSITIVITY
Troponin T High Sensitivity: <15 ng/L (ref 0–19)
Troponin T High Sensitivity: <15 ng/L (ref 0–19)

## 2024-01-27 MED ORDER — ACETAMINOPHEN 500 MG PO TABS
1000.0000 mg | ORAL_TABLET | Freq: Once | ORAL | Status: AC
  Administered 2024-01-27: 16:00:00 1000 mg via ORAL
  Filled 2024-01-27: qty 2

## 2024-01-27 MED ORDER — OXYCODONE HCL 5 MG PO TABS
5.0000 mg | ORAL_TABLET | Freq: Once | ORAL | Status: AC
  Administered 2024-01-27: 16:00:00 5 mg via ORAL
  Filled 2024-01-27: qty 1

## 2024-01-27 NOTE — ED Notes (Signed)
 Pt ambulated to bathroom with no assistance.

## 2024-01-27 NOTE — ED Notes (Signed)
 ED Provider at bedside.

## 2024-01-27 NOTE — ED Provider Notes (Signed)
 [place] EMERGENCY DEPARTMENT AT [facility] Provider Note  CSN: [id number] Arrival date & time: 01/27/24 1521  Chief Complaint(s) Chest Pain  HPI Courtney Mcconnell is a 68 y.o. female history of coronary artery disease, COPD, hypertension, hyperlipidemia, prior stroke with residual right-sided weakness presenting with chest pain.  She reports chest pain to the right chest, radiates to her right arm.  Worse with moving the arm.  Improves with massaging her chest.  Denies any shortness of breath, nausea or vomiting, lightheadedness or dizziness, palpitations, syncope, back pain, left-sided chest pain, abdominal pain, other extremity pain.    Per triage note patient reported that the chest pain felt like prior stroke.  On further discussion the patient reports that her stroke symptoms were slurred speech and right-sided weakness and not chest pain.  She reports that her chronic symptoms are unchanged and she has no new symptoms like numbness or tingling, weakness in her arms or legs, loss of vision, trouble swallowing, trouble speaking, double vision, trouble walking, or any other new symptoms.    Past Medical History Past Medical History:  Diagnosis Date   Asthma    Bursitis    Hip   CAD (coronary artery disease) 02/11/2019   PCI/DES x1 to mLAD, focal ostial 70% lesion in 1st diag   COPD (chronic obstructive pulmonary disease) (HCC)    Essential hypertension    GERD (gastroesophageal reflux disease)    Headache(784.0)    Hyperlipidemia    NSTEMI (non-ST elevated myocardial infarction) (HCC) 02/11/2019   Pneumonia    Seizure (HCC)    Sleep apnea    Patient Active Problem List   Diagnosis Date Noted   Type 2 diabetes mellitus with other specified complication (HCC) 10/18/2022   S/P total knee arthroplasty, left 08/03/2021   History of DVT (deep vein thrombosis) 01/19/2021   S/P TKR (total knee replacement), right 01/09/2021   Rheumatoid arthritis with rheumatoid factor  of multiple sites without organ or systems involvement (HCC) 12/02/2020   Coronary artery disease involving native coronary artery of native heart with angina pectoris 05/22/2019   Hyperlipidemia with target LDL less than 70 -> CAD 02/12/2019   Presence of drug coated stent in LAD coronary artery 02/11/2019   History of non-ST elevation myocardial infarction (NSTEMI) 02/10/2019   Cerebral infarction, remote, resolved 06/27/2018   Pulmonary nodules 12/08/2015   Sleep apnea 04/06/2015   Pulmonary emphysema (HCC) 03/02/2015   Seizure disorder (HCC) 01/21/2015   Focal epilepsy with impairment of consciousness (HCC) 12/13/2014   Essential hypertension 04/18/2014   Tobacco use disorder 07/19/2012   Home Medication(s) Prior to Admission medications  Medication Sig Start Date End Date Taking? Authorizing Provider  albuterol  (VENTOLIN  HFA) 108 (90 Base) MCG/ACT inhaler INHALE TWO PUFFS BY MOUTH EVERY 6 HOURS AS NEEDED FOR WHEEZING, SHORTNESS OF BREATH 04/29/23   Dettinger, Fonda LABOR, MD  aspirin  EC 81 MG tablet Take 81 mg by mouth daily. Swallow whole.    [provider]  atorvastatin  (LIPITOR) 40 MG tablet TAKE 1 TABLET BY MOUTH EVERY DAY AT 6PM 10/10/23   Debera Jayson MATSU, MD  carvedilol  (COREG ) 3.125 MG tablet TAKE 1 TABLET BY MOUTH TWICE DAILY WITH A MEAL 10/10/23   Debera Jayson MATSU, MD  cetirizine  (ZYRTEC  ALLERGY) 10 MG tablet Take 1 tablet (10 mg total) by mouth daily for 14 days. 08/24/21 03/02/88  Renae Bernarda HERO, PA-C  ciprofloxacin  (CIPRO ) 500 MG tablet Take 1 tablet (500 mg total) by mouth 2 (two) times daily.  12/04/23   Zollie Lowers, MD  diclofenac  Sodium (VOLTAREN ) 1 % GEL Apply 2 g topically 4 (four) times daily. 10/18/22   Dettinger, Fonda LABOR, MD  fluticasone -salmeterol (ADVAIR) 100-50 MCG/ACT AEPB Inhale 1 puff into the lungs 2 (two) times daily. 07/31/23   Dettinger, Fonda LABOR, MD  Lacosamide  (VIMPAT ) 150 MG TABS Take 300 mg by mouth 2 (two) times daily. 11/02/19   [provider]  losartan  (COZAAR ) 25 MG tablet TAKE 1 TABLET BY MOUTH EVERY DAY 11/08/23   Debera Jayson MATSU, MD  nitroGLYCERIN  (NITROSTAT ) 0.4 MG SL tablet DISSOLVE 1 TABLET UNDER THE TONGUE EVERY 5 MINUTES AS NEEDED FOR CHEST PAIN. DO NOT EXCEED A TOTAL OF 3 DOSES IN 15 MINUTES. 11/08/23   Dettinger, Fonda LABOR, MD  nystatin  cream (MYCOSTATIN ) Apply 1 Application topically 2 (two) times daily. 04/26/23   Dettinger, Fonda LABOR, MD  omeprazole  (PRILOSEC  OTC) 20 MG tablet Take 20 mg by mouth 2 (two) times daily.    [provider]  Semaglutide ,0.25 or 0.5MG /DOS, (OZEMPIC , 0.25 OR 0.5 MG/DOSE,) 2 MG/3ML SOPN INJECT 0.5 MG UNDER THE SKIN ONCE A WEEK 12/24/23   Dettinger, Joshua A, MD  tiotropium (SPIRIVA  HANDIHALER) 18 MCG inhalation capsule place 1 CAPSULE into inhaler AND INHALE DAILY 10/18/22   Dettinger, Fonda LABOR, MD  zonisamide  (ZONEGRAN ) 100 MG capsule Take 200 mg by mouth at bedtime. 07/18/20   [provider]                                                                                                                                    Past Surgical History Past Surgical History:  Procedure Laterality Date   ABDOMINAL HYSTERECTOMY     APPENDECTOMY     CATARACT EXTRACTION Bilateral    CHOLECYSTECTOMY     CORONARY PRESSURE/FFR STUDY N/A 02/11/2019   Procedure: INTRAVASCULAR PRESSURE WIRE/FFR STUDY;  Surgeon: Verlin Lonni BIRCH, MD;  Location: [facility] INVASIVE CV LAB;  Service: Cardiovascular;  Laterality: N/A;   CORONARY STENT INTERVENTION N/A 02/11/2019   Procedure: CORONARY STENT INTERVENTION;  Surgeon: Verlin Lonni BIRCH, MD;  Location: [facility] INVASIVE CV LAB;; mid LAD 70% -- DES PCI (Synergy DES 2.5 x 28 --2.8 mm)   heal spur     right   LASER PHOTO ABLATION Right 08/27/2013   Procedure: LASER PHOTO ABLATION;  Surgeon: Norleen BIRCH Ku, MD;  Location: [facility] OR;  Service: Ophthalmology;  Laterality: Right;  Headscope laser AND Endolaser   LEFT HEART CATH AND CORONARY ANGIOGRAPHY  N/A 02/11/2019   Procedure: LEFT HEART CATH AND CORONARY ANGIOGRAPHY;  Surgeon: Verlin Lonni BIRCH, MD;  Location: [facility];;; prox RCA 20%. Ost-prox Cx 20%. ostial D1 70% & mLAD 70% => (DES PCI of LAD)   MEMBRANE PEEL Right 08/27/2013   Procedure: MEMBRANE PEEL;  Surgeon: Norleen BIRCH Ku, MD;  Location: [facility] OR;  Service: Ophthalmology;  Laterality: Right;   PARS PLANA VITRECTOMY  Right 08/27/2013   Procedure: PARS PLANA VITRECTOMY WITH 25 GAUGE;  Surgeon: Norleen JONETTA Ku, MD;  Location: [facility] OR;  Service: Ophthalmology;  Laterality: Right;   TOTAL KNEE ARTHROPLASTY Right 01/09/2021   Procedure: RIGHT TOTAL KNEE ARTHROPLASTY;  Surgeon: Barbarann Oneil BROCKS, MD;  Location: [facility] OR;  Service: Orthopedics;  Laterality: Right;  Needs RNFA   TOTAL KNEE ARTHROPLASTY Left 06/16/2021   Procedure: LEFT TOTAL KNEE ARTHROPLASTY;  Surgeon: Barbarann Oneil BROCKS, MD;  Location: [facility] OR;  Service: Orthopedics;  Laterality: Left;   Family History Family History  Problem Relation Age of Onset   COPD Mother    Diabetes Father    Hypertension Father    Hyperlipidemia Father    Cancer Sister        Bone Cancer   Healthy Daughter    Cancer Paternal Uncle    Stroke Paternal Grandmother    Diabetes Brother    Cancer Brother    Healthy Son    Colon cancer Neg Hx    Breast cancer Neg Hx     Social History Social History[1] Allergies Penicillins, Bee venom, Prednisone , Augmentin  [amoxicillin -pot clavulanate], and Doxycycline  hyclate  Review of Systems Review of Systems  All other systems reviewed and are negative.   Physical Exam Vital Signs  I have reviewed the triage vital signs BP (!) 164/71   Pulse (!) 57   Temp 98.7 F (37.1 C)   Resp 16   Ht 5' 6 (1.676 m)   Wt 102.5 kg   SpO2 100%   BMI 36.47 kg/m  Physical Exam Vitals and nursing note reviewed.  Constitutional:      General: She is not in acute distress.    Appearance: She is well-developed.  HENT:     Head: Normocephalic and  atraumatic.     Mouth/Throat:     Mouth: Mucous membranes are moist.  Eyes:     Pupils: Pupils are equal, round, and reactive to light.     Comments: No visual field deficit  Cardiovascular:     Rate and Rhythm: Normal rate and regular rhythm.     Pulses:          Radial pulses are 2+ on the right side and 2+ on the left side.     Heart sounds: No murmur heard. Pulmonary:     Effort: Pulmonary effort is normal. No respiratory distress.     Breath sounds: Normal breath sounds.  Chest:     Chest wall: Tenderness (reproduces pain\) present.  Abdominal:     General: Abdomen is flat.     Palpations: Abdomen is soft.     Tenderness: There is no abdominal tenderness.  Musculoskeletal:        General: No tenderness.     Right lower leg: No edema.     Left lower leg: No edema.  Skin:    General: Skin is warm and dry.  Neurological:     General: No focal deficit present.     Mental Status: She is alert. Mental status is at baseline.     Comments: Cranial nerves II through XII intact, strength 5 out of 5 in the bilateral upper and lower extremities with exception of possible very slight diminished right grip strength patient reports his chronic, no pronator drift, no sensory deficit to light touch, no dysmetria on finger-nose-finger testing, ambulatory with steady gait.   Psychiatric:        Mood and Affect: Mood normal.  Behavior: Behavior normal.     ED Results and Treatments Labs (all labs ordered are listed, but only abnormal results are displayed) Labs Reviewed  BASIC METABOLIC PANEL WITH GFR - Abnormal; Notable for the following components:      Result Value   Glucose, Bld 125 (*)    All other components within normal limits  CBC WITH DIFFERENTIAL/PLATELET - Abnormal; Notable for the following components:   MCV 100.3 (*)    All other components within normal limits  TROPONIN T, HIGH SENSITIVITY  TROPONIN T, HIGH SENSITIVITY                                                                                                                           Radiology DG Chest Portable 1 View Result Date: 01/27/2024 EXAM: 1 VIEW(S) XRAY OF THE CHEST 01/27/2024 04:27:00 PM COMPARISON: 08/24/2021 CLINICAL HISTORY: chest pain FINDINGS: LUNGS AND PLEURA: No focal pulmonary opacity. No pleural effusion. No pneumothorax. HEART AND MEDIASTINUM: No acute abnormality of the cardiac and mediastinal silhouettes. BONES AND SOFT TISSUES: No acute osseous abnormality. IMPRESSION: 1. No acute cardiopulmonary process. Electronically signed by: Elsie Gravely MD 01/27/2024 05:12 PM EST RP Workstation: [id number]    Pertinent labs & imaging results that were available during my care of the patient were reviewed by me and considered in my medical decision making (see MDM for details).  Medications Ordered in ED Medications  oxyCODONE  (Oxy IR/ROXICODONE ) immediate release tablet 5 mg (5 mg Oral Given 01/27/24 1608)  acetaminophen  (TYLENOL ) tablet 1,000 mg (1,000 mg Oral Given 01/27/24 1609)                                                                                                                                     Procedures Procedures  (including critical care time)  Medical Decision Making / ED Course   MDM:  68 year old presenting with chest pain.  Patient overall well-appearing, vital signs reassuring.  Suspect likely musculoskeletal pain given worse with movement, worse with palpation, improved with massaging of the chest.  EKG without STEMI.  History seems very atypical for ACS but will check troponin.  Will check chest x-ray although very low concern for pneumonia or pneumothorax.  Doubt other unusual process such as dissection.  No pleuritic pain, shortness of breath or tachycardia to suggest pulmonary embolism.  Patient denies PE risk factors such as recent surgery or travel.  Patient did report to triage that her chest pain felt like prior stroke however on further  questioning she did not have chest pain when she had a prior stroke and she had entirely different stroke symptoms which are unchanged.  Her neurologic exam today is reassuring.   Clinical Course as of 01/27/24 2132  Mon Jan 27, 2024  2131 Troponin negative x 2.  Patient reports feeling better.  Low concern for underlying dangerous process.  Chest x-ray is reassuring also. Will discharge patient to home. All questions answered. Patient comfortable with plan of discharge. Return precautions discussed with patient and specified on the after visit summary.  [WS]    Clinical Course User Index [WS] Francesca, Elsie CROME, MD     Additional history obtained: -Additional history obtained from family -External records from outside source obtained and reviewed including: Chart review including previous notes, labs, imaging, consultation notes including prior notes    Lab Tests: -I ordered, reviewed, and interpreted labs.   The pertinent results include:   Labs Reviewed  BASIC METABOLIC PANEL WITH GFR - Abnormal; Notable for the following components:      Result Value   Glucose, Bld 125 (*)    All other components within normal limits  CBC WITH DIFFERENTIAL/PLATELET - Abnormal; Notable for the following components:   MCV 100.3 (*)    All other components within normal limits  TROPONIN T, HIGH SENSITIVITY  TROPONIN T, HIGH SENSITIVITY    Notable for normal troponin x2  EKG   EKG Interpretation Date/Time:  Monday January 27 2024 16:01:10 EST Ventricular Rate:  59 PR Interval:  210 QRS Duration:  108 QT Interval:  471 QTC Calculation: 467 R Axis:   -19  Text Interpretation: Sinus rhythm Borderline left axis deviation RSR' in V1 or V2, right VCD or RVH Borderline T abnormalities, anterior leads No significant change since last tracing Confirmed by Francesca Elsie ([id number]) on 01/27/2024 4:13:01 PM         Imaging Studies ordered: I ordered imaging studies including CXR On my  interpretation imaging demonstrates no acute process I independently visualized and interpreted imaging. I agree with the radiologist interpretation   Medicines ordered and prescription drug management: Meds ordered this encounter  Medications   oxyCODONE  (Oxy IR/ROXICODONE ) immediate release tablet 5 mg    Refill:  0   acetaminophen  (TYLENOL ) tablet 1,000 mg    -I have reviewed the patients home medicines and have made adjustments as needed   Social Determinants of Health:  Diagnosis or treatment significantly limited by social determinants of health: obesity   Reevaluation: After the interventions noted above, I reevaluated the patient and found that their symptoms have improved  Co morbidities that complicate the patient evaluation  Past Medical History:  Diagnosis Date   Asthma    Bursitis    Hip   CAD (coronary artery disease) 02/11/2019   PCI/DES x1 to mLAD, focal ostial 70% lesion in 1st diag   COPD (chronic obstructive pulmonary disease) (HCC)    Essential hypertension    GERD (gastroesophageal reflux disease)    Headache(784.0)    Hyperlipidemia    NSTEMI (non-ST elevated myocardial infarction) (HCC) 02/11/2019   Pneumonia    Seizure (HCC)    Sleep apnea       Dispostion: Disposition decision including need for hospitalization was considered, and patient discharged from emergency department.    Final Clinical Impression(s) / ED Diagnoses Final diagnoses:  Atypical chest pain     This chart was  dictated using voice recognition software.  Despite best efforts to proofread,  errors can occur which can change the documentation meaning.     [1]  Social History Tobacco Use   Smoking status: Former    Current packs/day: 0.00    Average packs/day: 0.3 packs/day for 42.0 years (10.5 ttl pk-yrs)    Types: Cigarettes    Start date: 03/20/1972    Quit date: 03/20/2014    Years since quitting: 9.8   Smokeless tobacco: Never   Tobacco comments:    01/21/15  restarted smoking 4 mos ago  Vaping Use   Vaping status: Never Used  Substance Use Topics   Alcohol use: No    Alcohol/week: 0.0 standard drinks of alcohol   Drug use: No     Francesca Elsie CROME, MD 01/27/24 2132

## 2024-01-27 NOTE — ED Triage Notes (Signed)
 Pt arrived via POV from home c/o right side chest pain that radiates down her right arm. Pt describes the pain as feeling like a previous stroke. Pt reports her voice sounds slurred as well. Pt reports symptoms began at 0900 today.

## 2024-01-27 NOTE — Discharge Instructions (Signed)
 We evaluated you for your chest pain.  Your testing in the emergency department is reassuring.  We suspect your symptoms are most likely related to pain in your chest wall, the muscles and ribs of your chest.  Please take at 1000 mg of Tylenol  every 6 hours as needed for pain.  You can also apply ice or heat to help with pain.  We would like you to also follow-up with your cardiologist so we have placed a referral to help expedite this.  Please return to the emergency department if you have any new or worsening symptoms.

## 2024-01-27 NOTE — ED Notes (Signed)
 Per pt and family, pt allergic to tape. Medipore tape used instead, which they said would be okay.

## 2024-01-31 ENCOUNTER — Other Ambulatory Visit: Payer: Self-pay | Admitting: Family Medicine

## 2024-02-03 ENCOUNTER — Telehealth: Payer: Self-pay | Admitting: Pharmacy Technician

## 2024-02-03 ENCOUNTER — Other Ambulatory Visit (HOSPITAL_COMMUNITY): Payer: Self-pay

## 2024-02-03 NOTE — Telephone Encounter (Signed)
 Disregard please she has a new prescription plan

## 2024-02-03 NOTE — Telephone Encounter (Signed)
 Pharmacy Patient Advocate Encounter   Received notification from Onbase that prior authorization for Ozempic  (0.25 or 0.5 MG/DOSE) 2MG /3ML pen-injectors is due for renewal.   Insurance verification completed.   The patient is insured through [employer].  Action: PA required; PA started via CoverMyMeds. KEY [id number] . Waiting for clinical questions to populate.

## 2024-02-03 NOTE — Telephone Encounter (Signed)
Noted  -LS

## 2024-02-04 ENCOUNTER — Telehealth: Payer: Self-pay | Admitting: Family Medicine

## 2024-02-04 DIAGNOSIS — Z0279 Encounter for issue of other medical certificate: Secondary | ICD-10-CM

## 2024-02-04 NOTE — Telephone Encounter (Signed)
 Son dropped off handicap  forms to be completed and signed.  Form Fee Paid? (Y/N) y           If NO, form is placed on front office manager desk to hold until payment received. If YES, then form will be placed in the RX/HH Nurse Coordinators box for completion.  Form will not be processed until payment is received

## 2024-03-05 ENCOUNTER — Other Ambulatory Visit: Payer: Self-pay | Admitting: Family Medicine

## 2024-03-05 DIAGNOSIS — E1169 Type 2 diabetes mellitus with other specified complication: Secondary | ICD-10-CM

## 2024-11-24 ENCOUNTER — Ambulatory Visit: Payer: Self-pay
